# Patient Record
Sex: Male | Born: 1935 | Race: Black or African American | Hispanic: No | Marital: Married | Smoking: Former smoker
Health system: Southern US, Community
[De-identification: ages and names within clinical notes are randomized; demographics above are authoritative.]

## PROBLEM LIST (undated history)

## (undated) DIAGNOSIS — I10 Essential (primary) hypertension: Secondary | ICD-10-CM

## (undated) DIAGNOSIS — J189 Pneumonia, unspecified organism: Secondary | ICD-10-CM

## (undated) DIAGNOSIS — Z5111 Encounter for antineoplastic chemotherapy: Secondary | ICD-10-CM

## (undated) DIAGNOSIS — C3490 Malignant neoplasm of unspecified part of unspecified bronchus or lung: Principal | ICD-10-CM

## (undated) DIAGNOSIS — I509 Heart failure, unspecified: Secondary | ICD-10-CM

## (undated) DIAGNOSIS — I4891 Unspecified atrial fibrillation: Secondary | ICD-10-CM

## (undated) DIAGNOSIS — E7800 Pure hypercholesterolemia, unspecified: Secondary | ICD-10-CM

## (undated) DIAGNOSIS — K219 Gastro-esophageal reflux disease without esophagitis: Secondary | ICD-10-CM

## (undated) DIAGNOSIS — Z5112 Encounter for antineoplastic immunotherapy: Secondary | ICD-10-CM

## (undated) HISTORY — PX: WISDOM TOOTH EXTRACTION: SHX21

## (undated) HISTORY — PX: CATARACT EXTRACTION: SUR2

## (undated) HISTORY — DX: Encounter for antineoplastic chemotherapy: Z51.11

## (undated) HISTORY — DX: Malignant neoplasm of unspecified part of unspecified bronchus or lung: C34.90

## (undated) HISTORY — DX: Encounter for antineoplastic immunotherapy: Z51.12

## (undated) HISTORY — PX: ROTATOR CUFF REPAIR: SHX139

---

## 1999-05-17 ENCOUNTER — Inpatient Hospital Stay (HOSPITAL_COMMUNITY): Admission: EM | Admit: 1999-05-17 | Discharge: 1999-05-17 | Payer: Self-pay | Admitting: Emergency Medicine

## 1999-05-17 ENCOUNTER — Encounter: Payer: Self-pay | Admitting: Emergency Medicine

## 1999-06-24 ENCOUNTER — Ambulatory Visit (HOSPITAL_COMMUNITY): Admission: RE | Admit: 1999-06-24 | Discharge: 1999-06-24 | Payer: Self-pay | Admitting: Surgery

## 1999-07-18 ENCOUNTER — Ambulatory Visit (HOSPITAL_BASED_OUTPATIENT_CLINIC_OR_DEPARTMENT_OTHER): Admission: RE | Admit: 1999-07-18 | Discharge: 1999-07-18 | Payer: Self-pay | Admitting: Surgery

## 2000-08-16 ENCOUNTER — Ambulatory Visit (HOSPITAL_COMMUNITY): Admission: RE | Admit: 2000-08-16 | Discharge: 2000-08-16 | Payer: Self-pay | Admitting: *Deleted

## 2002-02-08 ENCOUNTER — Emergency Department (HOSPITAL_COMMUNITY): Admission: EM | Admit: 2002-02-08 | Discharge: 2002-02-08 | Payer: Self-pay | Admitting: Emergency Medicine

## 2007-02-20 ENCOUNTER — Encounter: Admission: RE | Admit: 2007-02-20 | Discharge: 2007-02-20 | Payer: Self-pay | Admitting: Internal Medicine

## 2007-06-14 ENCOUNTER — Encounter: Admission: RE | Admit: 2007-06-14 | Discharge: 2007-06-14 | Payer: Self-pay | Admitting: Family Medicine

## 2008-02-03 ENCOUNTER — Encounter: Admission: RE | Admit: 2008-02-03 | Discharge: 2008-02-03 | Payer: Self-pay | Admitting: Internal Medicine

## 2010-08-21 ENCOUNTER — Inpatient Hospital Stay (HOSPITAL_COMMUNITY)
Admission: EM | Admit: 2010-08-21 | Discharge: 2010-08-26 | Payer: Self-pay | Source: Home / Self Care | Admitting: Emergency Medicine

## 2010-12-13 LAB — BASIC METABOLIC PANEL
BUN: 10 mg/dL (ref 6–23)
BUN: 11 mg/dL (ref 6–23)
BUN: 13 mg/dL (ref 6–23)
BUN: 9 mg/dL (ref 6–23)
CO2: 25 mEq/L (ref 19–32)
Calcium: 8.4 mg/dL (ref 8.4–10.5)
Calcium: 8.7 mg/dL (ref 8.4–10.5)
Chloride: 104 mEq/L (ref 96–112)
Chloride: 106 mEq/L (ref 96–112)
Creatinine, Ser: 1.15 mg/dL (ref 0.4–1.5)
Creatinine, Ser: 1.22 mg/dL (ref 0.4–1.5)
GFR calc non Af Amer: 59 mL/min — ABNORMAL LOW (ref >60)
GFR calc non Af Amer: >60 mL/min (ref >60)
GFR calc non Af Amer: >60 mL/min (ref >60)
Glucose, Bld: 111 mg/dL — ABNORMAL HIGH (ref 70–99)
Glucose, Bld: 117 mg/dL — ABNORMAL HIGH (ref 70–99)
Glucose, Bld: 121 mg/dL — ABNORMAL HIGH (ref 70–99)
Potassium: 3.8 mEq/L (ref 3.5–5.1)
Potassium: 3.8 mEq/L (ref 3.5–5.1)

## 2010-12-13 LAB — CULTURE, BLOOD (ROUTINE X 2)
Culture  Setup Time: 201111201743
Culture: NO GROWTH
Culture: NO GROWTH

## 2010-12-13 LAB — CBC
HCT: 33.7 % — ABNORMAL LOW (ref 39.0–52.0)
HCT: 33.9 % — ABNORMAL LOW (ref 39.0–52.0)
HCT: 34.9 % — ABNORMAL LOW (ref 39.0–52.0)
HCT: 41.9 % (ref 39.0–52.0)
MCHC: 34.4 g/dL (ref 30.0–36.0)
MCHC: 34.6 g/dL (ref 30.0–36.0)
MCHC: 34.8 g/dL (ref 30.0–36.0)
MCHC: 35.1 g/dL (ref 30.0–36.0)
MCV: 86.3 fL (ref 78.0–100.0)
MCV: 87.3 fL (ref 78.0–100.0)
MCV: 87.5 fL (ref 78.0–100.0)
Platelets: 119 10*3/uL — ABNORMAL LOW (ref 150–400)
Platelets: 175 10*3/uL (ref 150–400)
RDW: 13.1 % (ref 11.5–15.5)
RDW: 13.2 % (ref 11.5–15.5)
RDW: 13.2 % (ref 11.5–15.5)
RDW: 13.3 % (ref 11.5–15.5)
RDW: 13.3 % (ref 11.5–15.5)
WBC: 17 10*3/uL — ABNORMAL HIGH (ref 4.0–10.5)
WBC: 18.8 10*3/uL — ABNORMAL HIGH (ref 4.0–10.5)

## 2010-12-13 LAB — COMPREHENSIVE METABOLIC PANEL
ALT: 24 U/L (ref 0–53)
AST: 24 U/L (ref 0–37)
Albumin: 2.7 g/dL — ABNORMAL LOW (ref 3.5–5.2)
CO2: 26 mEq/L (ref 19–32)
Calcium: 8.6 mg/dL (ref 8.4–10.5)
GFR calc Af Amer: >60 mL/min (ref >60)
GFR calc non Af Amer: >60 mL/min (ref >60)
Sodium: 137 mEq/L (ref 135–145)
Total Protein: 5.9 g/dL — ABNORMAL LOW (ref 6.0–8.3)

## 2010-12-13 LAB — DIFFERENTIAL
Basophils Absolute: 0 10*3/uL (ref 0.0–0.1)
Basophils Relative: 0 % (ref 0–1)
Eosinophils Relative: 1 % (ref 0–5)
Monocytes Absolute: 1.3 10*3/uL — ABNORMAL HIGH (ref 0.1–1.0)

## 2010-12-13 LAB — URINALYSIS, MICROSCOPIC ONLY
Glucose, UA: NEGATIVE mg/dL
Ketones, ur: NEGATIVE mg/dL
pH: 5.5 (ref 5.0–8.0)

## 2010-12-13 LAB — POCT I-STAT, CHEM 8
Calcium, Ion: 1.1 mmol/L — ABNORMAL LOW (ref 1.12–1.32)
Chloride: 105 mEq/L (ref 96–112)
Glucose, Bld: 142 mg/dL — ABNORMAL HIGH (ref 70–99)
HCT: 46 % (ref 39.0–52.0)
TCO2: 26 mmol/L (ref 0–100)

## 2010-12-13 LAB — HEMOGLOBIN A1C: Hgb A1c MFr Bld: 5.8 % — ABNORMAL HIGH (ref <5.7)

## 2010-12-13 LAB — URINALYSIS, ROUTINE W REFLEX MICROSCOPIC
Ketones, ur: 40 mg/dL — AB
Nitrite: POSITIVE — AB
Protein, ur: >300 mg/dL — AB
Urobilinogen, UA: 1 mg/dL (ref 0.0–1.0)

## 2010-12-13 LAB — URINE MICROSCOPIC-ADD ON

## 2010-12-13 LAB — URINE CULTURE
Colony Count: >=100000
Culture  Setup Time: 201111201741
Culture  Setup Time: 201111241854
Special Requests: POSITIVE

## 2010-12-13 LAB — LIPID PANEL
Cholesterol: 127 mg/dL (ref 0–200)
LDL Cholesterol: 73 mg/dL (ref 0–99)
VLDL: 8 mg/dL (ref 0–40)

## 2015-01-07 ENCOUNTER — Encounter (INDEPENDENT_AMBULATORY_CARE_PROVIDER_SITE_OTHER): Payer: Medicare Other | Admitting: Ophthalmology

## 2015-01-07 DIAGNOSIS — H43813 Vitreous degeneration, bilateral: Secondary | ICD-10-CM | POA: Diagnosis not present

## 2015-01-07 DIAGNOSIS — H35033 Hypertensive retinopathy, bilateral: Secondary | ICD-10-CM | POA: Diagnosis not present

## 2015-01-07 DIAGNOSIS — I10 Essential (primary) hypertension: Secondary | ICD-10-CM

## 2015-01-07 DIAGNOSIS — H33302 Unspecified retinal break, left eye: Secondary | ICD-10-CM

## 2015-01-19 ENCOUNTER — Ambulatory Visit (INDEPENDENT_AMBULATORY_CARE_PROVIDER_SITE_OTHER): Payer: Medicare Other | Admitting: Ophthalmology

## 2015-01-19 DIAGNOSIS — H33302 Unspecified retinal break, left eye: Secondary | ICD-10-CM

## 2015-05-21 ENCOUNTER — Ambulatory Visit (INDEPENDENT_AMBULATORY_CARE_PROVIDER_SITE_OTHER): Payer: Medicare Other | Admitting: Ophthalmology

## 2015-05-21 DIAGNOSIS — H33302 Unspecified retinal break, left eye: Secondary | ICD-10-CM | POA: Diagnosis not present

## 2015-05-21 DIAGNOSIS — H35033 Hypertensive retinopathy, bilateral: Secondary | ICD-10-CM | POA: Diagnosis not present

## 2015-05-21 DIAGNOSIS — H43813 Vitreous degeneration, bilateral: Secondary | ICD-10-CM | POA: Diagnosis not present

## 2015-05-21 DIAGNOSIS — I10 Essential (primary) hypertension: Secondary | ICD-10-CM

## 2015-09-09 ENCOUNTER — Other Ambulatory Visit: Payer: Self-pay | Admitting: Cardiology

## 2015-09-09 ENCOUNTER — Ambulatory Visit
Admission: RE | Admit: 2015-09-09 | Discharge: 2015-09-09 | Disposition: A | Discharge status: Home / Self Care | Payer: Medicare Other | Source: Ambulatory Visit | Attending: Cardiology | Admitting: Cardiology

## 2015-09-09 DIAGNOSIS — R0602 Shortness of breath: Secondary | ICD-10-CM

## 2015-09-09 DIAGNOSIS — F17200 Nicotine dependence, unspecified, uncomplicated: Secondary | ICD-10-CM

## 2015-09-29 ENCOUNTER — Other Ambulatory Visit: Payer: Self-pay | Admitting: Internal Medicine

## 2015-09-29 ENCOUNTER — Ambulatory Visit
Admission: RE | Admit: 2015-09-29 | Discharge: 2015-09-29 | Disposition: A | Discharge status: Home / Self Care | Payer: Medicare Other | Source: Ambulatory Visit | Attending: Internal Medicine | Admitting: Internal Medicine

## 2015-09-29 DIAGNOSIS — J181 Lobar pneumonia, unspecified organism: Principal | ICD-10-CM

## 2015-09-29 DIAGNOSIS — J189 Pneumonia, unspecified organism: Secondary | ICD-10-CM

## 2015-09-30 ENCOUNTER — Other Ambulatory Visit: Payer: Self-pay | Admitting: Internal Medicine

## 2015-09-30 DIAGNOSIS — J189 Pneumonia, unspecified organism: Secondary | ICD-10-CM

## 2015-09-30 DIAGNOSIS — J181 Lobar pneumonia, unspecified organism: Principal | ICD-10-CM

## 2015-10-06 ENCOUNTER — Ambulatory Visit
Admission: RE | Admit: 2015-10-06 | Discharge: 2015-10-06 | Disposition: A | Discharge status: Home / Self Care | Payer: Medicare Other | Source: Ambulatory Visit | Attending: Internal Medicine | Admitting: Internal Medicine

## 2015-10-06 DIAGNOSIS — J189 Pneumonia, unspecified organism: Secondary | ICD-10-CM

## 2015-10-06 DIAGNOSIS — J181 Lobar pneumonia, unspecified organism: Principal | ICD-10-CM

## 2015-10-06 MED ORDER — IOPAMIDOL (ISOVUE-300) INJECTION 61%
75.0000 mL | Freq: Once | INTRAVENOUS | Status: AC | PRN
  Administered 2015-10-06: 75 mL via INTRAVENOUS

## 2015-11-12 ENCOUNTER — Other Ambulatory Visit: Payer: Self-pay | Admitting: Internal Medicine

## 2015-11-12 ENCOUNTER — Ambulatory Visit
Admission: RE | Admit: 2015-11-12 | Discharge: 2015-11-12 | Disposition: A | Discharge status: Home / Self Care | Payer: Medicare Other | Source: Ambulatory Visit | Attending: Internal Medicine | Admitting: Internal Medicine

## 2015-11-12 DIAGNOSIS — R05 Cough: Secondary | ICD-10-CM

## 2015-11-12 DIAGNOSIS — R059 Cough, unspecified: Secondary | ICD-10-CM

## 2015-11-12 DIAGNOSIS — J189 Pneumonia, unspecified organism: Secondary | ICD-10-CM

## 2015-11-17 ENCOUNTER — Emergency Department (HOSPITAL_COMMUNITY): Payer: Medicare Other

## 2015-11-17 ENCOUNTER — Encounter (HOSPITAL_COMMUNITY): Payer: Self-pay | Admitting: Vascular Surgery

## 2015-11-17 ENCOUNTER — Inpatient Hospital Stay (HOSPITAL_COMMUNITY)
Admission: EM | Admit: 2015-11-17 | Discharge: 2015-11-24 | DRG: 166 | Disposition: A | Discharge status: Home / Self Care | Payer: Medicare Other | Attending: Internal Medicine | Admitting: Internal Medicine

## 2015-11-17 DIAGNOSIS — E785 Hyperlipidemia, unspecified: Secondary | ICD-10-CM | POA: Diagnosis not present

## 2015-11-17 DIAGNOSIS — Z87891 Personal history of nicotine dependence: Secondary | ICD-10-CM | POA: Diagnosis not present

## 2015-11-17 DIAGNOSIS — R918 Other nonspecific abnormal finding of lung field: Secondary | ICD-10-CM | POA: Diagnosis not present

## 2015-11-17 DIAGNOSIS — R05 Cough: Secondary | ICD-10-CM | POA: Diagnosis present

## 2015-11-17 DIAGNOSIS — I10 Essential (primary) hypertension: Secondary | ICD-10-CM | POA: Diagnosis present

## 2015-11-17 DIAGNOSIS — N400 Enlarged prostate without lower urinary tract symptoms: Secondary | ICD-10-CM | POA: Diagnosis present

## 2015-11-17 DIAGNOSIS — N170 Acute kidney failure with tubular necrosis: Secondary | ICD-10-CM | POA: Diagnosis not present

## 2015-11-17 DIAGNOSIS — I2699 Other pulmonary embolism without acute cor pulmonale: Secondary | ICD-10-CM | POA: Diagnosis not present

## 2015-11-17 DIAGNOSIS — Z7982 Long term (current) use of aspirin: Secondary | ICD-10-CM | POA: Diagnosis not present

## 2015-11-17 DIAGNOSIS — C7951 Secondary malignant neoplasm of bone: Secondary | ICD-10-CM | POA: Diagnosis present

## 2015-11-17 DIAGNOSIS — C7800 Secondary malignant neoplasm of unspecified lung: Secondary | ICD-10-CM

## 2015-11-17 DIAGNOSIS — R0902 Hypoxemia: Secondary | ICD-10-CM | POA: Insufficient documentation

## 2015-11-17 DIAGNOSIS — J9601 Acute respiratory failure with hypoxia: Secondary | ICD-10-CM | POA: Diagnosis present

## 2015-11-17 DIAGNOSIS — N179 Acute kidney failure, unspecified: Secondary | ICD-10-CM | POA: Diagnosis present

## 2015-11-17 DIAGNOSIS — Z9889 Other specified postprocedural states: Secondary | ICD-10-CM

## 2015-11-17 DIAGNOSIS — J189 Pneumonia, unspecified organism: Secondary | ICD-10-CM | POA: Diagnosis present

## 2015-11-17 DIAGNOSIS — C3432 Malignant neoplasm of lower lobe, left bronchus or lung: Principal | ICD-10-CM | POA: Diagnosis present

## 2015-11-17 DIAGNOSIS — J1100 Influenza due to unidentified influenza virus with unspecified type of pneumonia: Secondary | ICD-10-CM | POA: Diagnosis present

## 2015-11-17 DIAGNOSIS — Z79899 Other long term (current) drug therapy: Secondary | ICD-10-CM

## 2015-11-17 DIAGNOSIS — I4891 Unspecified atrial fibrillation: Secondary | ICD-10-CM | POA: Diagnosis not present

## 2015-11-17 HISTORY — DX: Unspecified atrial fibrillation: I48.91

## 2015-11-17 HISTORY — DX: Pneumonia, unspecified organism: J18.9

## 2015-11-17 HISTORY — DX: Gastro-esophageal reflux disease without esophagitis: K21.9

## 2015-11-17 HISTORY — DX: Pure hypercholesterolemia, unspecified: E78.00

## 2015-11-17 HISTORY — DX: Essential (primary) hypertension: I10

## 2015-11-17 LAB — COMPREHENSIVE METABOLIC PANEL
ALK PHOS: 77 U/L (ref 38–126)
ALT: 19 U/L (ref 17–63)
AST: 32 U/L (ref 15–41)
Albumin: 3.8 g/dL (ref 3.5–5.0)
Anion gap: 11 (ref 5–15)
BUN: 18 mg/dL (ref 6–20)
CALCIUM: 9 mg/dL (ref 8.9–10.3)
CHLORIDE: 108 mmol/L (ref 101–111)
CO2: 21 mmol/L — ABNORMAL LOW (ref 22–32)
CREATININE: 1.37 mg/dL — AB (ref 0.61–1.24)
GFR calc non Af Amer: 48 mL/min — ABNORMAL LOW (ref >60)
GFR, EST AFRICAN AMERICAN: 56 mL/min — AB (ref >60)
Glucose, Bld: 161 mg/dL — ABNORMAL HIGH (ref 65–99)
Potassium: 4 mmol/L (ref 3.5–5.1)
SODIUM: 140 mmol/L (ref 135–145)
Total Bilirubin: 0.5 mg/dL (ref 0.3–1.2)
Total Protein: 7.2 g/dL (ref 6.5–8.1)

## 2015-11-17 LAB — CBC WITH DIFFERENTIAL/PLATELET
Basophils Absolute: 0 10*3/uL (ref 0.0–0.1)
Basophils Relative: 0 %
EOS ABS: 0.3 10*3/uL (ref 0.0–0.7)
EOS PCT: 9 %
HCT: 39.8 % (ref 39.0–52.0)
HEMOGLOBIN: 13.4 g/dL (ref 13.0–17.0)
LYMPHS ABS: 0.6 10*3/uL — AB (ref 0.7–4.0)
LYMPHS PCT: 17 %
MCH: 30 pg (ref 26.0–34.0)
MCHC: 33.7 g/dL (ref 30.0–36.0)
MCV: 89.2 fL (ref 78.0–100.0)
MONOS PCT: 14 %
Monocytes Absolute: 0.5 10*3/uL (ref 0.1–1.0)
Neutro Abs: 2.1 10*3/uL (ref 1.7–7.7)
Neutrophils Relative %: 60 %
PLATELETS: 116 10*3/uL — AB (ref 150–400)
RBC: 4.46 MIL/uL (ref 4.22–5.81)
RDW: 13 % (ref 11.5–15.5)
WBC: 3.4 10*3/uL — ABNORMAL LOW (ref 4.0–10.5)

## 2015-11-17 LAB — URINALYSIS, ROUTINE W REFLEX MICROSCOPIC
BILIRUBIN URINE: NEGATIVE
Glucose, UA: NEGATIVE mg/dL
HGB URINE DIPSTICK: NEGATIVE
KETONES UR: NEGATIVE mg/dL
Leukocytes, UA: NEGATIVE
NITRITE: NEGATIVE
PH: 5 (ref 5.0–8.0)
Protein, ur: NEGATIVE mg/dL
SPECIFIC GRAVITY, URINE: 1.024 (ref 1.005–1.030)

## 2015-11-17 LAB — I-STAT CG4 LACTIC ACID, ED
Lactic Acid, Venous: 2.63 mmol/L (ref 0.5–2.0)
Lactic Acid, Venous: 0.77 mmol/L (ref 0.5–2.0)

## 2015-11-17 MED ORDER — DEXTROSE 5 % IV SOLN
1.0000 g | Freq: Once | INTRAVENOUS | Status: AC
  Administered 2015-11-17: 1 g via INTRAVENOUS
  Filled 2015-11-17: qty 10

## 2015-11-17 MED ORDER — SODIUM CHLORIDE 0.9 % IV BOLUS (SEPSIS)
1000.0000 mL | INTRAVENOUS | Status: AC
  Administered 2015-11-17 (×3): 1000 mL via INTRAVENOUS

## 2015-11-17 MED ORDER — DEXTROSE 5 % IV SOLN
500.0000 mg | Freq: Once | INTRAVENOUS | Status: AC
  Administered 2015-11-17: 500 mg via INTRAVENOUS
  Filled 2015-11-17: qty 500

## 2015-11-17 MED ORDER — SODIUM CHLORIDE 0.9 % IV BOLUS (SEPSIS)
500.0000 mL | INTRAVENOUS | Status: AC
  Administered 2015-11-17: 500 mL via INTRAVENOUS

## 2015-11-17 MED ORDER — DEXTROSE 5 % IV SOLN: 500.0000 mg | INTRAVENOUS | Status: DC

## 2015-11-17 MED ORDER — DEXTROSE 5 % IV SOLN: 1.0000 g | INTRAVENOUS | Status: DC

## 2015-11-17 NOTE — ED Provider Notes (Signed)
CSN: [id number]     Arrival date & time 11/17/15  1117 History   First MD Initiated Contact with Patient 11/17/15 1155     Chief Complaint  Patient presents with  . Pneumonia     (Consider location/radiation/quality/duration/timing/severity/associated sxs/prior Treatment) HPI Comments: 80 year old male with history of hypertension and hyperlipidemia who presents with cough. Patient was treated twice with oral antibiotics over the past 2 months for pneumonia. He saw his PCP 5 days ago and chest x-ray showed persistent infiltrate so he has been referred to a pulmonologist for evaluation. He reports that 4 days ago he began having a cough again associated with nasal congestion, generalized fatigue, and decreased oral intake. He reports gradually worsening shortness of breath. He denies any pain. No fevers, vomiting, or diarrhea. No recent hospitalizations.  Patient is a 80 y.o. male presenting with pneumonia. The history is provided by the patient and the spouse.  Pneumonia    Past Medical History  Diagnosis Date  . Hypertension   . Hypercholesteremia    Past Surgical History  Procedure Laterality Date  . Wisdom tooth extraction    . Rotator cuff repair Left    No family history on file. Social History  Substance Use Topics  . Smoking status: Former Smoker    Types: Cigarettes  . Smokeless tobacco: Never Used  . Alcohol Use: No    Review of Systems 10 Systems reviewed and are negative for acute change except as noted in the HPI.    Allergies  Review of patient's allergies indicates not on file.  Home Medications   Prior to Admission medications   Not on File   BP 76/67 mmHg  Pulse 81  Temp(Src) 97.4 F (36.3 C) (Oral)  Resp 23  Ht '6\' 1"'$  (1.854 m)  Wt 241 lb 1.6 oz (109.362 kg)  BMI 31.82 kg/m2  SpO2 94% Physical Exam  Constitutional: He is oriented to person, place, and time. He appears well-developed and well-nourished. No distress.  HENT:  Head:  Normocephalic and atraumatic.  Moist mucous membranes  Eyes: Conjunctivae are normal. Pupils are equal, round, and reactive to light.  Neck: Neck supple.  Cardiovascular: Normal rate, regular rhythm and normal heart sounds.   No murmur heard. Pulmonary/Chest: Effort normal.  Diminished BS b/l with scattered occasional expiratory wheezes  Abdominal: Soft. Bowel sounds are normal. He exhibits no distension. There is no tenderness.  Musculoskeletal: He exhibits no edema.  Neurological: He is alert and oriented to person, place, and time.  Fluent speech  Skin: Skin is warm. He is diaphoretic.  Psychiatric: He has a normal mood and affect. Judgment normal.  Nursing note and vitals reviewed.   ED Course  .Critical Care Performed by: Sharlett Iles Authorized by: Sharlett Iles Total critical care time: 35 minutes Critical care time was exclusive of separately billable procedures and treating other patients. Critical care was necessary to treat or prevent imminent or life-threatening deterioration of the following conditions: sepsis. Critical care was time spent personally by me on the following activities: development of treatment plan with patient or surrogate, evaluation of patient's response to treatment, examination of patient, obtaining history from patient or surrogate, ordering and performing treatments and interventions, ordering and review of laboratory studies, ordering and review of radiographic studies, pulse oximetry, re-evaluation of patient's condition and review of old charts.   (including critical care time) Labs Review Labs Reviewed  COMPREHENSIVE METABOLIC PANEL - Abnormal; Notable for the following:    CO2 21 (*)  Glucose, Bld 161 (*)    Creatinine, Ser 1.37 (*)    GFR calc non Af Amer 48 (*)    GFR calc Af Amer 56 (*)    All other components within normal limits  I-STAT CG4 LACTIC ACID, ED - Abnormal; Notable for the following:    Lactic Acid,  Venous 2.63 (*)    All other components within normal limits  URINE CULTURE  CULTURE, BLOOD (ROUTINE X 2)  CULTURE, BLOOD (ROUTINE X 2)  URINALYSIS, ROUTINE W REFLEX MICROSCOPIC (NOT AT [facility])  CBC WITH DIFFERENTIAL/PLATELET    Imaging Review Dg Chest 2 View  11/17/2015  CLINICAL DATA:  Cough, 4 days duration. Recent history of pneumonia. EXAM: CHEST  2 VIEW COMPARISON:  11/12/2015.  09/29/2015. FINDINGS: Infiltrate in the left perihilar region and left lower lobe have worsened. Multiple nodular shadows demonstrated at CT are subtly visible on the chest radiograph. No effusions. No bony abnormalities. Atherosclerosis affects the aorta. IMPRESSION: Worsened infiltrate in the left perihilar region and left lower lobe. Persistent faint nodular opacities in both lungs. Though there is almost certainly a component of pneumonia, I am concerned about malignancy and metastatic disease in this case. I would suggest that bronchoscopy at least be considered in this case. Electronically Signed   By: Nelson Chimes M.D.   On: 11/17/2015 11:55   I have personally reviewed and evaluated these lab results as part of my medical decision-making.   EKG Interpretation None     Medications  sodium chloride 0.9 % bolus 1,000 mL (1,000 mLs Intravenous New Bag/Given 11/17/15 1222)    Followed by  sodium chloride 0.9 % bolus 500 mL (not administered)  cefTRIAXone (ROCEPHIN) 1 g in dextrose 5 % 50 mL IVPB (1 g Intravenous New Bag/Given 11/17/15 1251)  azithromycin (ZITHROMAX) 500 mg in dextrose 5 % 250 mL IVPB (500 mg Intravenous New Bag/Given 11/17/15 1252)  cefTRIAXone (ROCEPHIN) 1 g in dextrose 5 % 50 mL IVPB (not administered)  azithromycin (ZITHROMAX) 500 mg in dextrose 5 % 250 mL IVPB (not administered)    MDM   Final diagnoses:  Community acquired pneumonia  Hypoxia   sepsis secondary to community-acquired pneumonia  Pt w/ persistent lung infiltrate despite 2 courses of antibiotics to treat  community-acquired pneumonia. On arrival, the patient was diaphoretic but nontoxic in appearance. He was mentating appropriately with normal work of breathing. O2 sat 89-90% on room air and he was placed on nasal cannula. Diminished breath sounds bilaterally. Blood pressure low at 94/61. Given his known infection, initiated a code sepsis with IV fluids, blood and urine cultures, and ceftriaxone and azithromycin to cover for community-acquired pneumonia. Labs show mild AK I with creatinine of 1.37, lactate 2.63 CXR shows worsening infiltrates in L lung w/ concern for underlying malignancy. Discussed admission with [facility] hospitalist, Dr. Sloan Leiter, and pt admitted for further treatment.   Sharlett Iles, MD 11/17/15 [phone]

## 2015-11-17 NOTE — ED Notes (Addendum)
Pt reports to the ED for eval of fatigue, decreased appetite, nasal congestion/drainage, and intermittently productive cough. He reports he was dx with PNA over a month ago and was tx with abx but he followed up with his PCP and he was found to still have a little bit of PNA and he was referred to a pulmonologist whom he has not been able to f/u with yet. Denies any fevers, chills, or N/V/D. Pt A&OX4 and resp e/u. Pt diaphoretic.

## 2015-11-17 NOTE — H&P (Signed)
PATIENT DETAILS Name: Matthew Berger Age: 80 y.o. Sex: male Date of Birth: [birth year] Admit Date: 11/17/2015 YKD:[id number] C, MD Referring Physician:Dr Little   CHIEF COMPLAINT:  Cough  HPI: Matthew Berger is a 80 y.o. male with a Past Medical History of hypertension, BPH who presents today with the above noted complaint. Per patient, since mid December he's been having a mostly dry cough. He was diagnosed with a pneumonia on chest x-ray and was prescribed antibiotic in mid December. Upon follow-up with his primary care practitioner, he was told he still had pneumonia, antibiotic treatment was extended by another week. He unfortunately continues to have symptoms, he has had numerous x-rays that show persistent left-sided infiltrate. He presents today with feeling weak and ongoing cough. He had he denies any fever. Does complain of some mild nasal congestion and denies myalgias. No nausea, vomiting or diarrhea. No skin rash.  ALLERGIES:  No Known Allergies  PAST MEDICAL HISTORY: Past Medical History  Diagnosis Date  . Hypertension   . Hypercholesteremia     PAST SURGICAL HISTORY: Past Surgical History  Procedure Laterality Date  . Wisdom tooth extraction    . Rotator cuff repair Left     MEDICATIONS AT HOME: Prior to Admission medications   Medication Sig Start Date End Date Taking? Authorizing Provider  aspirin 81 MG tablet Take 81 mg by mouth daily.   Yes Historical Provider, MD  atorvastatin (LIPITOR) 40 MG tablet Take 40 mg by mouth daily.   Yes Historical Provider, MD  diltiazem (DILACOR XR) 180 MG 24 hr capsule Take 180 mg by mouth daily.   Yes Historical Provider, MD  metoprolol tartrate (LOPRESSOR) 25 MG tablet Take 25 mg by mouth 2 (two) times daily.   Yes Historical Provider, MD  Multiple Vitamins-Minerals (MULTIVITAMIN WITH MINERALS) tablet Take 1 tablet by mouth daily.   Yes Historical Provider, MD  naproxen sodium (ALEVE) 220 MG tablet  Take 220 mg by mouth 2 (two) times daily with a meal.   Yes Historical Provider, MD  tamsulosin (FLOMAX) 0.4 MG CAPS capsule Take 0.4 mg by mouth daily.   Yes Historical Provider, MD    FAMILY HISTORY: Denies family history of CAD.  SOCIAL HISTORY:  reports that he has quit smoking. His smoking use included Cigarettes. He has never used smokeless tobacco. He reports that he does not drink alcohol or use illicit drugs. Lives at: Home Mobility: Independent  REVIEW OF SYSTEMS:  Constitutional:   No  weight loss, night sweats,  Fevers, chills, fatigue.  HEENT:    No headaches, Dysphagia,Tooth/dental problems,Sore throat,  No sneezing, itching, ear ache, nasal congestion, post nasal drip  Cardio-vascular: No chest pain,Orthopnea, PND,lower extremity edema, anasarca, palpitations  GI:  No heartburn, indigestion, abdominal pain, nausea, vomiting, diarrhea, melena or hematochezia  Resp: No hemoptysis,plueritic chest pain.   Skin:  No rash or lesions.  GU:  No dysuria, change in color of urine, no urgency or frequency.  No flank pain.  Musculoskeletal: No joint pain or swelling.  No decreased range of motion.  No back pain.  Endocrine: No heat intolerance, no cold intolerance, no polyuria, no polydipsia  Psych: No change in mood or affect. No depression or anxiety.  No memory loss.   PHYSICAL EXAM: Blood pressure 129/78, pulse 71, temperature 98.5 F (36.9 C), temperature source Oral, resp. rate 23, height '6\' 1"'$  (1.854 m), weight 109.362 kg (241 lb 1.6 oz), SpO2 98 %.  General  appearance :Awake, alert, not in any distress. Speech Clear. Not toxic Looking HEENT: Atraumatic and Normocephalic, pupils equally reactive to light and accomodation Neck: supple, no JVD. No cervical lymphadenopathy.  Chest:Good air entry bilaterally, no added sounds  CVS: S1 S2 regular, no murmurs.  Abdomen: Bowel sounds present, Non tender and not distended with no gaurding, rigidity or  rebound. Extremities: B/L Lower Ext shows no edema, both legs are warm to touch Neurology:  Non focal Skin:No Rash Wounds:N/A  LABS ON ADMISSION:   Recent Labs  11/17/15 1143  NA 140  K 4.0  CL 108  CO2 21*  GLUCOSE 161*  BUN 18  CREATININE 1.37*  CALCIUM 9.0    Recent Labs  11/17/15 1143  AST 32  ALT 19  ALKPHOS 77  BILITOT 0.5  PROT 7.2  ALBUMIN 3.8   No results for input(s): LIPASE, AMYLASE in the last 72 hours.  Recent Labs  11/17/15 1411  WBC 3.4*  NEUTROABS 2.1  HGB 13.4  HCT 39.8  MCV 89.2  PLT 116*   No results for input(s): CKTOTAL, CKMB, CKMBINDEX, TROPONINI in the last 72 hours. No results for input(s): DDIMER in the last 72 hours. Invalid input(s): POCBNP   RADIOLOGIC STUDIES ON ADMISSION: Dg Chest 2 View  11/17/2015  CLINICAL DATA:  Cough, 4 days duration. Recent history of pneumonia. EXAM: CHEST  2 VIEW COMPARISON:  11/12/2015.  09/29/2015. FINDINGS: Infiltrate in the left perihilar region and left lower lobe have worsened. Multiple nodular shadows demonstrated at CT are subtly visible on the chest radiograph. No effusions. No bony abnormalities. Atherosclerosis affects the aorta. IMPRESSION: Worsened infiltrate in the left perihilar region and left lower lobe. Persistent faint nodular opacities in both lungs. Though there is almost certainly a component of pneumonia, I am concerned about malignancy and metastatic disease in this case. I would suggest that bronchoscopy at least be considered in this case. Electronically Signed   By: Nelson Chimes M.D.   On: 11/17/2015 11:55    I have personally reviewed images of chest xray o   EKG: Personally reviewed. Normal sinus rhythm  ASSESSMENT AND PLAN: Present on Admission:  . Persistent left-sided infiltrate: Ex-smoker-empirically start Rocephin and Zithromax for possible community-acquired Pneumonia. Creatinine is mildly elevated, will hydrate overnight-and repeat creatinine in the morning-if  stable-we will do a CT scan of the chest with IV contrast-to rule out underlying malignancy. Suspect may need a bronchoscopy/pulmonary consultation  . Mild ARF (acute renal failure): Likely mild prerenal azotemia secondary to above. Hydrate and recheck. Blood pressure was soft on admission, hold all antihypertensives.  Marland Kitchen HTN (hypertension): Hold antihypertensives-blood pressure was soft on initial presentation-currently in the low 100s. Follow and resume accordingly   . Dyslipidemia: Continue statin   . BPH (benign prostatic hyperplasia): Continue Flomax  Further plan will depend as patient's clinical course evolves and further radiologic and laboratory data become available. Patient will be monitored closely.  Above noted plan was discussed with patient face to face at bedside, he was in agreement.   CONSULTS: None  DVT Prophylaxis: Prophylactic Lovenox   Code Status: Full Code  Disposition Plan:  Discharge back home in 1-2 days  Total time spent 45 minutes.Greater than 50% of this time was spent in counseling, explanation of diagnosis, planning of further management, and coordination of care.  [facility] Pager [phone]  If 7PM-7AM, please contact night-coverage [email or web address] Password [email or web address] 11/17/2015, 4:26 PM

## 2015-11-17 NOTE — Progress Notes (Addendum)
Pharmacy Antibiotic Note  Matthew Berger is a 80 y.o. male admitted on 11/17/2015 with pneumonia.  Pharmacy has been consulted for ceftriaxone and azithromycin dosing.  Plan: Ceftriaxone 1 g q24 h x 7 days and Azithromycin 500 mg q24h x 5 days  Height: '6\' 1"'$  (185.4 cm) Weight: 241 lb 1.6 oz (109.362 kg) IBW/kg (Calculated) : 79.9  Temp (24hrs), Avg:98.1 F (36.7 C), Min:98.1 F (36.7 C), Max:98.1 F (36.7 C)   Recent Labs Lab 11/17/15 1140 11/17/15 1143  CREATININE  --  1.37*  LATICACIDVEN 2.63*  --     Estimated Creatinine Clearance: 58.6 mL/min (by C-G formula based on Cr of 1.37).    Allergies not on file  Antimicrobials this admission: Ceftriaxone 2/15>> Azithromycin 2/15>>  Dose adjustments this admission: NA  Microbiology results: Blood x 2 Urine  Pharmacy Code Sepsis Protocol  Time of code sepsis page: 1210 '[x]'$  Antibiotics delivered at 1220  Were antibiotics ordered at the time of the code sepsis page? Yes Was it required to contact the physician? '[]'$  Physician not contacted '[]'$  Physician contacted to order antibiotics for code sepsis '[]'$  Physician contacted to recommend changing antibiotics  Pharmacy consulted for: ceftriaxone, azithromycin  Thank you for allowing pharmacy to be a part of this patient's care.  Levester Fresh, PharmD, BCPS, [facility] Clinical Pharmacist Pager [phone] 11/17/2015 12:23 PM

## 2015-11-18 ENCOUNTER — Encounter (HOSPITAL_COMMUNITY): Payer: Self-pay | Admitting: General Practice

## 2015-11-18 DIAGNOSIS — J189 Pneumonia, unspecified organism: Secondary | ICD-10-CM

## 2015-11-18 DIAGNOSIS — I10 Essential (primary) hypertension: Secondary | ICD-10-CM

## 2015-11-18 DIAGNOSIS — J1100 Influenza due to unidentified influenza virus with unspecified type of pneumonia: Secondary | ICD-10-CM | POA: Diagnosis present

## 2015-11-18 DIAGNOSIS — N179 Acute kidney failure, unspecified: Secondary | ICD-10-CM

## 2015-11-18 DIAGNOSIS — N400 Enlarged prostate without lower urinary tract symptoms: Secondary | ICD-10-CM

## 2015-11-18 DIAGNOSIS — E785 Hyperlipidemia, unspecified: Secondary | ICD-10-CM

## 2015-11-18 HISTORY — DX: Pneumonia, unspecified organism: J18.9

## 2015-11-18 LAB — CREATININE, SERUM
Creatinine, Ser: 1.03 mg/dL (ref 0.61–1.24)
GFR calc Af Amer: >60 mL/min (ref >60)
GFR calc non Af Amer: >60 mL/min (ref >60)

## 2015-11-18 LAB — CBC
HEMATOCRIT: 41.3 % (ref 39.0–52.0)
Hemoglobin: 13.9 g/dL (ref 13.0–17.0)
MCH: 30.4 pg (ref 26.0–34.0)
MCHC: 33.7 g/dL (ref 30.0–36.0)
MCV: 90.4 fL (ref 78.0–100.0)
PLATELETS: 110 10*3/uL — AB (ref 150–400)
RBC: 4.57 MIL/uL (ref 4.22–5.81)
RDW: 13.4 % (ref 11.5–15.5)
WBC: 6.6 10*3/uL (ref 4.0–10.5)

## 2015-11-18 LAB — INFLUENZA PANEL BY PCR (TYPE A & B)
H1N1FLUPCR: NOT DETECTED
Influenza A By PCR: NEGATIVE
Influenza B By PCR: NEGATIVE

## 2015-11-18 LAB — STREP PNEUMONIAE URINARY ANTIGEN: STREP PNEUMO URINARY ANTIGEN: NEGATIVE

## 2015-11-18 MED ORDER — ASPIRIN EC 81 MG PO TBEC
81.0000 mg | DELAYED_RELEASE_TABLET | Freq: Every day | ORAL | Status: DC
  Administered 2015-11-18 – 2015-11-23 (×5): 81 mg via ORAL
  Filled 2015-11-18 (×6): qty 1

## 2015-11-18 MED ORDER — CETYLPYRIDINIUM CHLORIDE 0.05 % MT LIQD
7.0000 mL | Freq: Two times a day (BID) | OROMUCOSAL | Status: DC
  Administered 2015-11-18 – 2015-11-24 (×10): 7 mL via OROMUCOSAL

## 2015-11-18 MED ORDER — ADULT MULTIVITAMIN W/MINERALS CH
1.0000 | ORAL_TABLET | Freq: Every day | ORAL | Status: DC
  Administered 2015-11-18 – 2015-11-24 (×6): 1 via ORAL
  Filled 2015-11-18 (×7): qty 1

## 2015-11-18 MED ORDER — DEXTROSE 5 % IV SOLN
1.0000 g | INTRAVENOUS | Status: DC
  Administered 2015-11-18 – 2015-11-23 (×7): 1 g via INTRAVENOUS
  Filled 2015-11-18 (×7): qty 10

## 2015-11-18 MED ORDER — ENOXAPARIN SODIUM 40 MG/0.4ML ~~LOC~~ SOLN
40.0000 mg | Freq: Every day | SUBCUTANEOUS | Status: DC
  Administered 2015-11-18 – 2015-11-23 (×5): 40 mg via SUBCUTANEOUS
  Filled 2015-11-18 (×6): qty 0.4

## 2015-11-18 MED ORDER — SODIUM CHLORIDE 0.9 % IV SOLN
INTRAVENOUS | Status: AC
  Administered 2015-11-18 (×2): via INTRAVENOUS

## 2015-11-18 MED ORDER — DEXTROSE 5 % IV SOLN
500.0000 mg | INTRAVENOUS | Status: DC
  Administered 2015-11-18 – 2015-11-20 (×4): 500 mg via INTRAVENOUS
  Filled 2015-11-18 (×4): qty 500

## 2015-11-18 MED ORDER — METOPROLOL TARTRATE 25 MG PO TABS
25.0000 mg | ORAL_TABLET | Freq: Two times a day (BID) | ORAL | Status: DC
  Administered 2015-11-18 – 2015-11-24 (×12): 25 mg via ORAL
  Filled 2015-11-18 (×12): qty 1

## 2015-11-18 MED ORDER — TAMSULOSIN HCL 0.4 MG PO CAPS
0.4000 mg | ORAL_CAPSULE | Freq: Every day | ORAL | Status: DC
  Administered 2015-11-18 – 2015-11-24 (×6): 0.4 mg via ORAL
  Filled 2015-11-18 (×7): qty 1

## 2015-11-18 MED ORDER — ATORVASTATIN CALCIUM 40 MG PO TABS
40.0000 mg | ORAL_TABLET | Freq: Every day | ORAL | Status: DC
  Administered 2015-11-18 – 2015-11-24 (×6): 40 mg via ORAL
  Filled 2015-11-18 (×7): qty 1

## 2015-11-18 NOTE — Progress Notes (Signed)
Patient arrived on unit via stretcher from ED.  No family at bedside.  Telemetry placed per MD order & CMT notified.

## 2015-11-18 NOTE — Progress Notes (Signed)
Patient had a 16 beat run of SVT.  Patient asymptomatic.  BP 138/78 HR 87.  MD notified. Will continue to monitor.

## 2015-11-18 NOTE — Progress Notes (Signed)
[facility] PROGRESS NOTE  CAMP GOPAL VZC:[id number] DOB: 1935/10/27 DOA: 11/17/2015 PCP: [name], MD  HPI/Brief narrative 80 y.o. male with a Past Medical History of hypertension, BPH who presents to the ED with over 2 mos hx of persistent pneumonia, failing outpatient treatment.   Assessment/Plan: . Persistent left-sided infiltrate: Ex-smoker-empirically start Rocephin and Zithromax for possible community-acquired Pneumonia. Creatinine was mildly elevated, improved with IVF hydration. Patient s/p chest ct in 1/17 with findings of persistent pneumonia and with recs for repeat study in 1-2 months. Will order repeat CT chest with contrast  . Mild ARF (acute renal failure): Likely mild prerenal azotemia secondary to above. Improved with hydration. Blood pressure was soft on admission, held all antihypertensives.  Marland Kitchen HTN (hypertension): Held antihypertensives on admit-blood pressure was soft on initial presentation  . Dyslipidemia: Continue statin   . BPH (benign prostatic hyperplasia): Continue Flomax  Code Status: Full Family Communication: Pt in room, wife at bedside Disposition Plan: Unclear at this time   Consultants:    Procedures:    Antibiotics: Anti-infectives    Start     Dose/Rate Route Frequency Ordered Stop   11/18/15 1200  cefTRIAXone (ROCEPHIN) 1 g in dextrose 5 % 50 mL IVPB  Status:  Discontinued     1 g 100 mL/hr over 30 Minutes Intravenous Every 24 hours 11/17/15 1224 11/18/15 0020   11/18/15 1200  azithromycin (ZITHROMAX) 500 mg in dextrose 5 % 250 mL IVPB  Status:  Discontinued     500 mg 250 mL/hr over 60 Minutes Intravenous Every 24 hours 11/17/15 1224 11/18/15 0020   11/18/15 0020  cefTRIAXone (ROCEPHIN) 1 g in dextrose 5 % 50 mL IVPB     1 g 100 mL/hr over 30 Minutes Intravenous Every 24 hours 11/18/15 0020     11/18/15 0020  azithromycin (ZITHROMAX) 500 mg in dextrose 5 % 250 mL IVPB     500 mg 250 mL/hr over 60 Minutes Intravenous  Every 24 hours 11/18/15 0020     11/17/15 1200  cefTRIAXone (ROCEPHIN) 1 g in dextrose 5 % 50 mL IVPB     1 g 100 mL/hr over 30 Minutes Intravenous  Once 11/17/15 1158 11/18/15 0826   11/17/15 1200  azithromycin (ZITHROMAX) 500 mg in dextrose 5 % 250 mL IVPB     500 mg 250 mL/hr over 60 Minutes Intravenous  Once 11/17/15 1158 11/18/15 0826      HPI/Subjective: Feels better today  Objective: Filed Vitals:   11/18/15 0800 11/18/15 0814 11/18/15 0815 11/18/15 0853  BP: 152/86 152/86 152/88 146/70  Pulse: 111 111 111 114  Temp:    99.4 F (37.4 C)  TempSrc:    Oral  Resp: '24 22 22 22  '$ Height:    '6\' 1"'$  (1.854 m)  Weight:    111.4 kg (245 lb 9.5 oz)  SpO2: 92% 93% 94% 97%    Intake/Output Summary (Last 24 hours) at 11/18/15 1624 Last data filed at 11/18/15 1531  Gross per 24 hour  Intake   3000 ml  Output   1200 ml  Net   1800 ml   Filed Weights   11/17/15 1124 11/18/15 0853  Weight: 109.362 kg (241 lb 1.6 oz) 111.4 kg (245 lb 9.5 oz)    Exam:   General:  Awake, in nad  Cardiovascular: regular, s1, s2  Respiratory: normal resp effort, no wheezing  Abdomen: soft, nondistended  Musculoskeletal: perfused, no clubbing   Data Reviewed: Basic Metabolic Panel:  Recent Labs Lab  11/17/15 1143 11/18/15 0036  NA 140  --   Berger 4.0  --   CL 108  --   CO2 21*  --   GLUCOSE 161*  --   BUN 18  --   CREATININE 1.37* 1.03  CALCIUM 9.0  --    Liver Function Tests:  Recent Labs Lab 11/17/15 1143  AST 32  ALT 19  ALKPHOS 77  BILITOT 0.5  PROT 7.2  ALBUMIN 3.8   No results for input(s): LIPASE, AMYLASE in the last 168 hours. No results for input(s): AMMONIA in the last 168 hours. CBC:  Recent Labs Lab 11/17/15 1411 11/18/15 0036  WBC 3.4* 6.6  NEUTROABS 2.1  --   HGB 13.4 13.9  HCT 39.8 41.3  MCV 89.2 90.4  PLT 116* 110*   Cardiac Enzymes: No results for input(s): CKTOTAL, CKMB, CKMBINDEX, TROPONINI in the last 168 hours. BNP (last 3 results) No  results for input(s): BNP in the last 8760 hours.  ProBNP (last 3 results) No results for input(s): PROBNP in the last 8760 hours.  CBG: No results for input(s): GLUCAP in the last 168 hours.  Recent Results (from the past 240 hour(s))  Blood Culture (routine x 2)     Status: None (Preliminary result)   Collection Time: 11/17/15 12:25 PM  Result Value Ref Range Status   Specimen Description BLOOD RIGHT ANTECUBITAL  Final   Special Requests BOTTLES DRAWN AEROBIC AND ANAEROBIC 5CC  Final   Culture NO GROWTH < 24 HOURS  Final   Report Status PENDING  Incomplete  Blood Culture (routine x 2)     Status: None (Preliminary result)   Collection Time: 11/17/15 12:35 PM  Result Value Ref Range Status   Specimen Description BLOOD RIGHT HAND  Final   Special Requests BOTTLES DRAWN AEROBIC AND ANAEROBIC 5CC  Final   Culture NO GROWTH < 24 HOURS  Final   Report Status PENDING  Incomplete  Urine culture     Status: None (Preliminary result)   Collection Time: 11/17/15  4:56 PM  Result Value Ref Range Status   Specimen Description URINE, CLEAN CATCH  Final   Special Requests NONE  Final   Culture NO GROWTH < 24 HOURS  Final   Report Status PENDING  Incomplete     Studies: Dg Chest 2 View  11/17/2015  CLINICAL DATA:  Cough, 4 days duration. Recent history of pneumonia. EXAM: CHEST  2 VIEW COMPARISON:  11/12/2015.  09/29/2015. FINDINGS: Infiltrate in the left perihilar region and left lower lobe have worsened. Multiple nodular shadows demonstrated at CT are subtly visible on the chest radiograph. No effusions. No bony abnormalities. Atherosclerosis affects the aorta. IMPRESSION: Worsened infiltrate in the left perihilar region and left lower lobe. Persistent faint nodular opacities in both lungs. Though there is almost certainly a component of pneumonia, I am concerned about malignancy and metastatic disease in this case. I would suggest that bronchoscopy at least be considered in this case.  Electronically Signed   By: Nelson Chimes M.D.   On: 11/17/2015 11:55    Scheduled Meds: . aspirin EC  81 mg Oral Daily  . atorvastatin  40 mg Oral Daily  . azithromycin  500 mg Intravenous Q24H  . cefTRIAXone (ROCEPHIN)  IV  1 g Intravenous Q24H  . enoxaparin (LOVENOX) injection  40 mg Subcutaneous Daily  . multivitamin with minerals  1 tablet Oral Daily  . tamsulosin  0.4 mg Oral Daily   Continuous Infusions: . sodium  chloride 75 mL/hr at 11/18/15 1529    Principal Problem:   Pneumonia Active Problems:   ARF (acute renal failure) (HCC)   HTN (hypertension)   Dyslipidemia   BPH (benign prostatic hyperplasia)   PNA (pneumonia)    Matthew Berger, Matthew Berger  [facility] Pager [phone]. If 7PM-7AM, please contact night-coverage at [email or web address], password [facility] 11/18/2015, 4:24 PM  LOS: 1 day

## 2015-11-18 NOTE — Progress Notes (Signed)
Called ED for report; spoke with Amy.  Amy stated that she will call me back.

## 2015-11-18 NOTE — Progress Notes (Signed)
Educated patient on sputum collection and left sputum cup at bedside. Patient states that he is not able to cough anything up at this time.

## 2015-11-18 NOTE — Progress Notes (Signed)
11/18/2015 7:15 PM  Assisted primary RN with management of patient.  Per EKG patient in new onset afib with RVR, heart rate at 103.  Pt denies SOB, chest pain or discomfort at this time, other vitals stable.  A few minutes later, patient heart rate remained afib and increased to 120s-130s sustaining.  Pt still completely asymptomatic.  Dr. Wyline Copas notified, orders received for PO metoprolol.  Administered medication per order.  Heart rate currently anywhere from 100s-110s.  Dr. Wyline Copas made verbally aware while on the floor.  Rapid response RN also contacted to have the patient on their radar if his heart rate were to increase again.    Princella Pellegrini

## 2015-11-18 NOTE — Progress Notes (Signed)
Utilization Review Completed.Donne Anon T2/16/2017

## 2015-11-19 ENCOUNTER — Inpatient Hospital Stay (HOSPITAL_COMMUNITY): Payer: Medicare Other

## 2015-11-19 ENCOUNTER — Encounter (HOSPITAL_COMMUNITY)
Admission: EM | Disposition: A | Discharge status: Home / Self Care | Payer: Self-pay | Source: Home / Self Care | Attending: Internal Medicine

## 2015-11-19 DIAGNOSIS — C3490 Malignant neoplasm of unspecified part of unspecified bronchus or lung: Secondary | ICD-10-CM

## 2015-11-19 DIAGNOSIS — J189 Pneumonia, unspecified organism: Secondary | ICD-10-CM

## 2015-11-19 DIAGNOSIS — R918 Other nonspecific abnormal finding of lung field: Secondary | ICD-10-CM

## 2015-11-19 HISTORY — PX: VIDEO BRONCHOSCOPY: SHX5072

## 2015-11-19 HISTORY — DX: Malignant neoplasm of unspecified part of unspecified bronchus or lung: C34.90

## 2015-11-19 LAB — URINE CULTURE

## 2015-11-19 LAB — GRAM STAIN

## 2015-11-19 LAB — LEGIONELLA ANTIGEN, URINE

## 2015-11-19 LAB — HIV ANTIBODY (ROUTINE TESTING W REFLEX): HIV SCREEN 4TH GENERATION: NONREACTIVE

## 2015-11-19 SURGERY — BRONCHOSCOPY, WITH FLUOROSCOPY
Anesthesia: Moderate Sedation | Laterality: Bilateral

## 2015-11-19 MED ORDER — BUTAMBEN-TETRACAINE-BENZOCAINE 2-2-14 % EX AERO: 1.0000 | INHALATION_SPRAY | Freq: Once | CUTANEOUS | Status: DC

## 2015-11-19 MED ORDER — PHENYLEPHRINE HCL 0.25 % NA SOLN
NASAL | Status: DC | PRN
  Administered 2015-11-19: 2 via NASAL

## 2015-11-19 MED ORDER — SODIUM CHLORIDE 0.9 % IV SOLN
INTRAVENOUS | Status: DC
  Administered 2015-11-19: 14:00:00 via INTRAVENOUS

## 2015-11-19 MED ORDER — LIDOCAINE VISCOUS 2 % MT SOLN
OROMUCOSAL | Status: DC | PRN
  Administered 2015-11-19: 1

## 2015-11-19 MED ORDER — LIDOCAINE HCL (PF) 1 % IJ SOLN
INTRAMUSCULAR | Status: DC | PRN
  Administered 2015-11-19: 6 mL

## 2015-11-19 MED ORDER — PHENYLEPHRINE HCL 0.25 % NA SOLN: 1.0000 | Freq: Four times a day (QID) | NASAL | Status: DC | PRN

## 2015-11-19 MED ORDER — LIDOCAINE HCL 2 % EX GEL: 1.0000 "application " | Freq: Once | CUTANEOUS | Status: DC

## 2015-11-19 MED ORDER — FENTANYL CITRATE (PF) 100 MCG/2ML IJ SOLN
INTRAMUSCULAR | Status: AC
  Filled 2015-11-19: qty 4

## 2015-11-19 MED ORDER — MIDAZOLAM HCL 10 MG/2ML IJ SOLN
INTRAMUSCULAR | Status: DC | PRN
  Administered 2015-11-19 (×3): 1 mg via INTRAVENOUS

## 2015-11-19 MED ORDER — IOHEXOL 300 MG/ML  SOLN
75.0000 mL | Freq: Once | INTRAMUSCULAR | Status: AC | PRN
Start: 2015-11-19 — End: 2015-11-19
  Administered 2015-11-19: 75 mL via INTRAVENOUS

## 2015-11-19 MED ORDER — MIDAZOLAM HCL 5 MG/ML IJ SOLN
INTRAMUSCULAR | Status: AC
  Filled 2015-11-19: qty 2

## 2015-11-19 MED ORDER — FENTANYL CITRATE (PF) 100 MCG/2ML IJ SOLN
25.0000 ug | INTRAMUSCULAR | Status: DC | PRN
  Administered 2015-11-19 (×3): 25 ug via INTRAVENOUS

## 2015-11-19 NOTE — Progress Notes (Signed)
Indication: LLL pulmonary infiltrates in the 80 -year-old ex smoker  Written informed consent was obtained from the patient prior to the procedure. The risks of the procedure including coughing, bleeding and a small chance of lung cancer requiring a chest tube were discussed with the patient in great detail and evidenced understanding.  3 mg of Versed and  52mg of fentanyl were used in divided doses during the procedure. Bronchoscope was inserted from the right Nare. The upper airway appeared normal. Vocal cord showed normal appearance in motion. The trachea bronchial tree was then examined to the subsegmental level. Moderate white  secretions were noted. No endobronchial lesions were noted.  Attention was then turned to the left lower lobe. Bronchoalveolar lavage was obtained from the superior segment of the  lower lobe with good return. Transbronchial biopsies x3 were obtained from the sup segment of LLL & other subsegments of the left lower lobe. The patient tolerated procedure well with minimal bleeding. BAL was also obtained from the RUL  A portable chest XR. will be performed to rule out presence of pneumothorax. He was awake and alert in the end of the procedure.  Can resume diet in 2h ALVA,RAKESH V. MD [phone]

## 2015-11-19 NOTE — Care Management Important Message (Signed)
Important Message  Patient Details  Name: Matthew Berger MRN: [id number] Date of Birth: [birth year]   Medicare Important Message Given:  Yes    Nicolus Ose P Shalaya Swailes 11/19/2015, 11:54 AM

## 2015-11-19 NOTE — Progress Notes (Signed)
[facility] PROGRESS NOTE  Matthew Berger OLM:[id number] DOB: December 23, 1935 DOA: 11/17/2015 PCP: [name], MD  HPI/Brief narrative 80 y.o. male with a Past Medical History of hypertension, BPH who presents to the ED with over 2 mos hx of persistent pneumonia, failing outpatient treatment.   Assessment/Plan: . Persistent left-sided infiltrate: Ex-smoker-empirically started Rocephin and Zithromax for possible community-acquired Pneumonia. Creatinine was mildly elevated, improved with IVF hydration. Patient s/p chest ct in 1/17 with findings of persistent pneumonia and with recs for repeat study in 1-2 months. Repeat CT chest with contrast on 2/17 with concerns of multiple pulm nodules and possible bone mets involving T10 and L2. Consulted Pulmonary and pt is now s/p VBAL with cytology and cultures pending. Concerns for malignancy vs reactive changes from pneumonia  . Mild ARF (acute renal failure): Likely mild prerenal azotemia secondary to above. Improved with hydration. Blood pressure was soft on admission, held all antihypertensives.  Marland Kitchen HTN (hypertension): Held antihypertensives on admit-blood pressure was soft on initial presentation  . Dyslipidemia: Continue statin   . BPH (benign prostatic hyperplasia): Continue Flomax  Code Status: Full Family Communication: Pt in room Disposition Plan: Unclear at this time   Consultants:  Pulmonary  Procedures:  VBAL 2/17  Antibiotics: Anti-infectives    Start     Dose/Rate Route Frequency Ordered Stop   11/18/15 1200  cefTRIAXone (ROCEPHIN) 1 g in dextrose 5 % 50 mL IVPB  Status:  Discontinued     1 g 100 mL/hr over 30 Minutes Intravenous Every 24 hours 11/17/15 1224 11/18/15 0020   11/18/15 1200  azithromycin (ZITHROMAX) 500 mg in dextrose 5 % 250 mL IVPB  Status:  Discontinued     500 mg 250 mL/hr over 60 Minutes Intravenous Every 24 hours 11/17/15 1224 11/18/15 0020   11/18/15 0020  cefTRIAXone (ROCEPHIN) 1 g in dextrose 5 %  50 mL IVPB     1 g 100 mL/hr over 30 Minutes Intravenous Every 24 hours 11/18/15 0020     11/18/15 0020  azithromycin (ZITHROMAX) 500 mg in dextrose 5 % 250 mL IVPB     500 mg 250 mL/hr over 60 Minutes Intravenous Every 24 hours 11/18/15 0020     11/17/15 1200  cefTRIAXone (ROCEPHIN) 1 g in dextrose 5 % 50 mL IVPB     1 g 100 mL/hr over 30 Minutes Intravenous  Once 11/17/15 1158 11/18/15 0826   11/17/15 1200  azithromycin (ZITHROMAX) 500 mg in dextrose 5 % 250 mL IVPB     500 mg 250 mL/hr over 60 Minutes Intravenous  Once 11/17/15 1158 11/18/15 0826      HPI/Subjective: States feeling better today  Objective: Filed Vitals:   11/19/15 1515 11/19/15 1520 11/19/15 1525 11/19/15 1729  BP: 138/82 126/76 146/91 120/69  Pulse: 78 81 81 73  Temp:    98.7 F (37.1 C)  TempSrc:    Oral  Resp: '20 19 22 20  '$ Height:      Weight:      SpO2: 93% 94% 93% 95%    Intake/Output Summary (Last 24 hours) at 11/19/15 1826 Last data filed at 11/19/15 1518  Gross per 24 hour  Intake   1000 ml  Output   2575 ml  Net  -1575 ml   Filed Weights   11/18/15 0853 11/18/15 2024 11/19/15 1342  Weight: 111.4 kg (245 lb 9.5 oz) 109 kg (240 lb 4.8 oz) 111.131 kg (245 lb)    Exam:   General:  Awake, in nad, laying  in bed  Cardiovascular: regular, s1, s2  Respiratory: normal resp effort, no wheezing  Abdomen: soft, nondistended  Musculoskeletal: perfused, no clubbing, no cyanosis  Data Reviewed: Basic Metabolic Panel:  Recent Labs Lab 11/17/15 1143 11/18/15 0036  NA 140  --   K 4.0  --   CL 108  --   CO2 21*  --   GLUCOSE 161*  --   BUN 18  --   CREATININE 1.37* 1.03  CALCIUM 9.0  --    Liver Function Tests:  Recent Labs Lab 11/17/15 1143  AST 32  ALT 19  ALKPHOS 77  BILITOT 0.5  PROT 7.2  ALBUMIN 3.8   No results for input(s): LIPASE, AMYLASE in the last 168 hours. No results for input(s): AMMONIA in the last 168 hours. CBC:  Recent Labs Lab 11/17/15 1411  11/18/15 0036  WBC 3.4* 6.6  NEUTROABS 2.1  --   HGB 13.4 13.9  HCT 39.8 41.3  MCV 89.2 90.4  PLT 116* 110*   Cardiac Enzymes: No results for input(s): CKTOTAL, CKMB, CKMBINDEX, TROPONINI in the last 168 hours. BNP (last 3 results) No results for input(s): BNP in the last 8760 hours.  ProBNP (last 3 results) No results for input(s): PROBNP in the last 8760 hours.  CBG: No results for input(s): GLUCAP in the last 168 hours.  Recent Results (from the past 240 hour(s))  Blood Culture (routine x 2)     Status: None (Preliminary result)   Collection Time: 11/17/15 12:25 PM  Result Value Ref Range Status   Specimen Description BLOOD RIGHT ANTECUBITAL  Final   Special Requests BOTTLES DRAWN AEROBIC AND ANAEROBIC 5CC  Final   Culture NO GROWTH 2 DAYS  Final   Report Status PENDING  Incomplete  Blood Culture (routine x 2)     Status: None (Preliminary result)   Collection Time: 11/17/15 12:35 PM  Result Value Ref Range Status   Specimen Description BLOOD RIGHT HAND  Final   Special Requests BOTTLES DRAWN AEROBIC AND ANAEROBIC 5CC  Final   Culture NO GROWTH 2 DAYS  Final   Report Status PENDING  Incomplete  Urine culture     Status: None   Collection Time: 11/17/15  4:56 PM  Result Value Ref Range Status   Specimen Description URINE, CLEAN CATCH  Final   Special Requests NONE  Final   Culture 6,000 COLONIES/mL INSIGNIFICANT GROWTH  Final   Report Status 11/19/2015 FINAL  Final     Studies: Ct Chest W Contrast  11/19/2015  CLINICAL DATA:  Lung metastases.  Atrial fibrillation EXAM: CT CHEST WITH CONTRAST TECHNIQUE: Multidetector CT imaging of the chest was performed during intravenous contrast administration. CONTRAST:  81m OMNIPAQUE IOHEXOL 300 MG/ML  SOLN COMPARISON:  Chest CT October 06, 2015; chest radiograph November 17, 2015 FINDINGS: Mediastinum/Lymph Nodes: Thyroid appears normal. There are several stable subcentimeter mediastinal lymph nodes. There is a focal lymph node in  the right hilum which appears slightly larger than on recent prior study. This lymph node currently measures 2.0 x 1.8 cm. There is a mildly prominent sub- carinal lymph node measuring 1.7 x 1.5 cm. There is no thoracic aortic aneurysm or dissection. The visualized great vessels appear normal. There are scattered foci of atherosclerotic change in aorta. Pericardium is upper limits normal in thickness. Lungs/Pleura: There is extensive left lower lobe consolidation, also present previously. Multiple parenchymal lung nodular opacities are noted throughout the lungs bilaterally. There is a nodular lesion in the anterior segment  of the right upper lobe currently measuring 1.5 x 1.2 cm, compared to 1.3 x 1.0 cm on the prior study. Multiple other nodular lesions appear either stable or slightly increased in size compared to 6 weeks prior. There is relative confluence in the right upper lobe anteriorly compared to the previous study, felt to be indicative of progression of neoplastic change in this area. There are smaller scattered nodular lesions in the left upper lobe with the largest lesion in this area measuring 5 mm. In the right middle lobe, there are new small nodular opacities as well as a slightly irregular ground-glass type opacity measuring 1.6 x 1.0 cm. Multiple nodular lesions are noted in the left lower lobe with several nodular lesions in this area larger. The largest individual nodular lesion in the left lower lobe currently measures 1.3 x 1.1 cm; on the recent prior study, this same nodular lesion measured 1.1 x 0.8 cm. Upper abdomen: In the visualized upper abdomen, there is hepatic steatosis. No adrenal lesions are identified. There is atherosclerotic change in the upper abdominal aorta. Musculoskeletal: There is a mixed sclerotic and lytic lesion involving the T10 vertebral body and pedicles bilaterally. There is also mixed sclerosis and lucency in the L2 vertebral body, incompletely visualized.  IMPRESSION: There are multiple pulmonary nodular lesions which have increased in number compared to 6 weeks prior. Several nodular lesions that were present previously are slightly increased in size. There is a new enlarged lymph node in the right hilum. There is also a prominent lymph node in the sub- carinal region which may be marginally more prominent than on prior study. 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 There is continued airspace consolidation throughout much of the left lower lobe, stable. Electronically Signed   By: Lowella Grip III M.D.   On: 11/19/2015 07:06   Dg Chest Port 1 View  11/19/2015  CLINICAL DATA:  Post bronchoscopy EXAM: PORTABLE CHEST 1 VIEW COMPARISON:  2/15/ 17 FINDINGS: Cardiomediastinal silhouette is stable. Persistent infiltrate in left lower lobe retro hilar region. Vague nodules bilateral upper lobe are poorly visualized. There is no pneumothorax. No pulmonary edema or pleural effusion. IMPRESSION: Persistent infiltrate in left lower lobe retro hilar region. Vague nodules bilateral upper lobe are poorly visualized. There is no pneumothorax. Electronically Signed   By: Lahoma Crocker M.D.   On: 11/19/2015 15:28   Dg C-arm Bronchoscopy  11/19/2015  CLINICAL DATA:  C-ARM BRONCHOSCOPY Fluoroscopy was utilized by the requesting physician.  No radiographic interpretation.    Scheduled Meds: . antiseptic oral rinse  7 mL Mouth Rinse BID  . aspirin EC  81 mg Oral Daily  . atorvastatin  40 mg Oral Daily  . azithromycin  500 mg Intravenous Q24H  . cefTRIAXone (ROCEPHIN)  IV  1 g Intravenous Q24H  . enoxaparin (LOVENOX) injection  40 mg Subcutaneous Daily  . metoprolol tartrate  25 mg Oral BID  . multivitamin with minerals  1 tablet Oral Daily  . tamsulosin  0.4 mg Oral Daily   Continuous Infusions:    Principal Problem:   Pneumonia Active  Problems:   ARF (acute renal failure) (HCC)   HTN (hypertension)   Dyslipidemia   BPH (benign prostatic hyperplasia)   PNA (pneumonia)   Pulmonary nodules/lesions, multiple    CHIU, STEPHEN K  [facility] Pager [phone]. If 7PM-7AM, please contact night-coverage at [email or web address], password [facility] 11/19/2015, 6:26  PM  LOS: 2 days

## 2015-11-19 NOTE — Consult Note (Signed)
Name: Matthew Berger MRN: [id number] DOB: [birth year]    ADMISSION DATE:  11/17/2015 CONSULTATION DATE:  2/17  REFERRING MD :  Wyline Copas   CHIEF COMPLAINT:  Abnormal CT chest   BRIEF PATIENT DESCRIPTION:  This is a 80 year old male who was diagnosed and treated for a Pneumonia back in Dec 2016. Since that time he has had several course of abx w/out resolution of symptoms. Had CT back in Jan 17 showed the LLL airspace disease and scattered pulmonary nodules. Admitted for failure to respond to out-pt rx on 2/15. A CT of chest was obtained which showed no sig change in the LLL airspace disease but worsening diffuse pulmonary nodules that had increased in Number and size. Also there was newly identified lytic lesions of T10 and L2. PCCM was asked to eval.   SIGNIFICANT EVENTS    STUDIES:  CT chest 2/17: There are multiple pulmonary nodular lesions which have increased in number compared to 6 weeks prior. Several nodular lesions that were present previously are slightly increased in size. There is a new enlarged lymph node in the right hilum. There is also a prominent lymph node in the sub- carinal region which may be marginally more prominent than on prior study. 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 There is continued airspace consolidation throughout much of the left lower lobe, stable.   HISTORY OF PRESENT ILLNESS:   This is a 80 year old male who was admitted on 2/15 w/ cc: cough. Per pt had been dx'd w/ PNA back in Dec 2016 via CXR and was treated w/ ABX. On f/u he continued to have cough was told he still had PNA and his antibiotic course was continued another week. His symptoms persisted in spite of therapy. He had a CT of chest on 1/4 this showed dense consolidation of the LLL and numerous pulmonary nodules t/o both lungs. He continued to have weakness and non-productive  cough. He was admitted on 2/15 w/ these symptoms above as well as worsening weakness & decreased appetite. He was placed on IV abx, provided IV hydration for mild AKI and an repeat CT chest was obtained on 2/17. This showed no change in the LLL, as well as increase in the number of nodules as well as size t/o both lungs. PCCM was asked to see in consult given concern for malignant process.  PAST MEDICAL HISTORY :   has a past medical history of Hypertension; Hypercholesteremia; Atrial fibrillation ([place]); Pneumonia (11/18/2015); and GERD (gastroesophageal reflux disease).  has past surgical history that includes Wisdom tooth extraction and Rotator cuff repair (Left). Prior to Admission medications   Medication Sig Start Date End Date Taking? Authorizing Provider  aspirin 81 MG tablet Take 81 mg by mouth daily.   Yes Historical Provider, MD  atorvastatin (LIPITOR) 40 MG tablet Take 40 mg by mouth daily.   Yes Historical Provider, MD  diltiazem (DILACOR XR) 180 MG 24 hr capsule Take 180 mg by mouth daily.   Yes Historical Provider, MD  metoprolol tartrate (LOPRESSOR) 25 MG tablet Take 25 mg by mouth 2 (two) times daily.   Yes Historical Provider, MD  Multiple Vitamins-Minerals (MULTIVITAMIN WITH MINERALS) tablet Take 1 tablet by mouth daily.   Yes Historical Provider, MD  naproxen sodium (ALEVE) 220 MG tablet Take 220 mg by mouth 2 (two) times daily with a meal.   Yes Historical  Provider, MD  tamsulosin (FLOMAX) 0.4 MG CAPS capsule Take 0.4 mg by mouth daily.   Yes Historical Provider, MD   No Known Allergies  FAMILY HISTORY:  family history is not on file. SOCIAL HISTORY:  reports that he has quit smoking. His smoking use included Cigarettes. He has never used smokeless tobacco. He reports that he does not drink alcohol or use illicit drugs.  REVIEW OF SYSTEMS:   Constitutional: Negative for fever, chills, weight loss, +malaise/fatigue and diaphoresis.  HENT: Negative for hearing loss, ear pain,  nosebleeds, congestion, sore throat, neck pain, tinnitus and ear discharge.   Eyes: Negative for blurred vision, double vision, photophobia, pain, discharge and redness.  Respiratory: Non productive cough, no hemoptysis, no sputum production, increased shortness of breath w/ exertion, wheezing and stridor.   Cardiovascular: Negative for chest pain, palpitations, orthopnea, claudication, leg swelling and PND.  Gastrointestinal: Negative for heartburn, nausea, vomiting, abdominal pain, diarrhea, constipation, blood in stool and melena.  Genitourinary: Negative for dysuria, urgency, frequency, hematuria and flank pain.  Musculoskeletal: Negative for myalgias, back pain, joint pain and falls.  Skin: Negative for itching and rash.  Neurological: Negative for dizziness, tingling, tremors, sensory change, speech change, focal weakness, seizures, loss of consciousness, weakness and headaches.  Endo/Heme/Allergies: Negative for environmental allergies and polydipsia. Does not bruise/bleed easily.  SUBJECTIVE:  No distress  VITAL SIGNS: Temp:  [98.4 F (36.9 C)-99.8 F (37.7 C)] 98.4 F (36.9 C) (02/17 0923) Pulse Rate:  [70-87] 77 (02/17 0923) Resp:  [18-20] 18 (02/17 0923) BP: (137-140)/(49-78) 140/49 mmHg (02/17 0923) SpO2:  [99 %-100 %] 100 % (02/17 0923) Weight:  [240 lb 4.8 oz (109 kg)] 240 lb 4.8 oz (109 kg) (02/16 2024)  PHYSICAL EXAMINATION: General:  80 year old male, resting comfortably in bed. No acute distress Neuro:  Awake, oriented x3, no focal def  HEENT:  MMM, no nasal flaring, no JVD  Cardiovascular:  Rrr, no MRG Lungs:  Clear, no accessory muscle use  Abdomen:  Soft, not tender, no OM, + bowel sounds Musculoskeletal:  Equal st and bulk  Skin:  Warm and dry    Recent Labs Lab 11/17/15 1143 11/18/15 0036  NA 140  --   K 4.0  --   CL 108  --   CO2 21*  --   BUN 18  --   CREATININE 1.37* 1.03  GLUCOSE 161*  --     Recent Labs Lab 11/17/15 1411 11/18/15 0036    HGB 13.4 13.9  HCT 39.8 41.3  WBC 3.4* 6.6  PLT 116* 110*   Dg Chest 2 View  11/17/2015  CLINICAL DATA:  Cough, 4 days duration. Recent history of pneumonia. EXAM: CHEST  2 VIEW COMPARISON:  11/12/2015.  09/29/2015. FINDINGS: Infiltrate in the left perihilar region and left lower lobe have worsened. Multiple nodular shadows demonstrated at CT are subtly visible on the chest radiograph. No effusions. No bony abnormalities. Atherosclerosis affects the aorta. IMPRESSION: Worsened infiltrate in the left perihilar region and left lower lobe. Persistent faint nodular opacities in both lungs. Though there is almost certainly a component of pneumonia, I am concerned about malignancy and metastatic disease in this case. I would suggest that bronchoscopy at least be considered in this case. Electronically Signed   By: Nelson Chimes M.D.   On: 11/17/2015 11:55   Ct Chest W Contrast  11/19/2015  CLINICAL DATA:  Lung metastases.  Atrial fibrillation EXAM: CT CHEST WITH CONTRAST TECHNIQUE: Multidetector CT imaging of the chest  was performed during intravenous contrast administration. CONTRAST:  22m OMNIPAQUE IOHEXOL 300 MG/ML  SOLN COMPARISON:  Chest CT October 06, 2015; chest radiograph November 17, 2015 FINDINGS: Mediastinum/Lymph Nodes: Thyroid appears normal. There are several stable subcentimeter mediastinal lymph nodes. There is a focal lymph node in the right hilum which appears slightly larger than on recent prior study. This lymph node currently measures 2.0 x 1.8 cm. There is a mildly prominent sub- carinal lymph node measuring 1.7 x 1.5 cm. There is no thoracic aortic aneurysm or dissection. The visualized great vessels appear normal. There are scattered foci of atherosclerotic change in aorta. Pericardium is upper limits normal in thickness. Lungs/Pleura: There is extensive left lower lobe consolidation, also present previously. Multiple parenchymal lung nodular opacities are noted throughout the lungs  bilaterally. There is a nodular lesion in the anterior segment of the right upper lobe currently measuring 1.5 x 1.2 cm, compared to 1.3 x 1.0 cm on the prior study. Multiple other nodular lesions appear either stable or slightly increased in size compared to 6 weeks prior. There is relative confluence in the right upper lobe anteriorly compared to the previous study, felt to be indicative of progression of neoplastic change in this area. There are smaller scattered nodular lesions in the left upper lobe with the largest lesion in this area measuring 5 mm. In the right middle lobe, there are new small nodular opacities as well as a slightly irregular ground-glass type opacity measuring 1.6 x 1.0 cm. Multiple nodular lesions are noted in the left lower lobe with several nodular lesions in this area larger. The largest individual nodular lesion in the left lower lobe currently measures 1.3 x 1.1 cm; on the recent prior study, this same nodular lesion measured 1.1 x 0.8 cm. Upper abdomen: In the visualized upper abdomen, there is hepatic steatosis. No adrenal lesions are identified. There is atherosclerotic change in the upper abdominal aorta. Musculoskeletal: There is a mixed sclerotic and lytic lesion involving the T10 vertebral body and pedicles bilaterally. There is also mixed sclerosis and lucency in the L2 vertebral body, incompletely visualized. IMPRESSION: There are multiple pulmonary nodular lesions which have increased in number compared to 6 weeks prior. Several nodular lesions that were present previously are slightly increased in size. There is a new enlarged lymph node in the right hilum. There is also a prominent lymph node in the sub- carinal region which may be marginally more prominent than on prior study. 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 There is continued airspace  consolidation throughout much of the left lower lobe, stable. Electronically Signed   By: WLowella GripIII M.D.   On: 11/19/2015 07:06    ASSESSMENT / PLAN: Persistent LLL airspace disease Possible PNA  Multiple bilateral pulmonary nodules of increasing size and number Lytic lesions involving the T10 and L2 area.   Discussion.  Concerned about the ideal target area. But almost certainly this is an metastatic malignancy.  Plan Will start w/ FOB of the LLL w/ wash and TBBx ->if non-diagnostic then would first do PET, then consider EBUS Cont current abx  PErick ColaceACNP-BC LCusterPager # [phone] # [phone] no answer  11/19/2015, 9:32 AM

## 2015-11-19 NOTE — Progress Notes (Signed)
Video bronchoscopy performed.  Intervention bronchial washing. Intervention bronchial brushing. Intervention bronchial biopsy.  No complications noted.  Will continue to monitor. 

## 2015-11-20 DIAGNOSIS — N170 Acute kidney failure with tubular necrosis: Secondary | ICD-10-CM

## 2015-11-20 LAB — CBC
HEMATOCRIT: 38 % — AB (ref 39.0–52.0)
Hemoglobin: 12.9 g/dL — ABNORMAL LOW (ref 13.0–17.0)
MCH: 30.1 pg (ref 26.0–34.0)
MCHC: 33.9 g/dL (ref 30.0–36.0)
MCV: 88.6 fL (ref 78.0–100.0)
PLATELETS: 117 10*3/uL — AB (ref 150–400)
RBC: 4.29 MIL/uL (ref 4.22–5.81)
RDW: 12.9 % (ref 11.5–15.5)
WBC: 3.9 10*3/uL — AB (ref 4.0–10.5)

## 2015-11-20 LAB — BASIC METABOLIC PANEL
Anion gap: 9 (ref 5–15)
BUN: 11 mg/dL (ref 6–20)
CALCIUM: 8.7 mg/dL — AB (ref 8.9–10.3)
CO2: 26 mmol/L (ref 22–32)
CREATININE: 0.99 mg/dL (ref 0.61–1.24)
Chloride: 106 mmol/L (ref 101–111)
GFR calc Af Amer: >60 mL/min (ref >60)
GLUCOSE: 103 mg/dL — AB (ref 65–99)
POTASSIUM: 3.8 mmol/L (ref 3.5–5.1)
SODIUM: 141 mmol/L (ref 135–145)

## 2015-11-20 NOTE — Progress Notes (Signed)
PCCM Interval Note  Await FOB results from 2/17, cytology, cultures, TBBx pathology are all pending.  CXR from 2/17 post-FOB without PTX.  We will follow for results with you.   Baltazar Apo, MD, PhD 11/20/2015, 11:46 AM [facility] [phone] or if no answer [phone]

## 2015-11-20 NOTE — Progress Notes (Addendum)
[facility] PROGRESS NOTE  Matthew Berger FXT:[id number] DOB: 04/25/1936 DOA: 11/17/2015 PCP: [name], MD  HPI/Brief narrative 80 y.o. male with a Past Medical History of hypertension, BPH who presents to the ED with over 2 mos hx of persistent pneumonia, failing outpatient treatment.   Assessment/Plan: . Persistent left-sided infiltrate: Ex-smoker-empirically started Rocephin and Zithromax for possible community-acquired Pneumonia. Creatinine was mildly elevated, improved with IVF hydration. Patient s/p chest ct in 1/17 with findings of persistent pneumonia and with recs for repeat study in 1-2 months. Repeat CT chest with contrast on 2/17 with concerns of multiple pulm nodules and possible bone mets involving T10 and L2. Pulmonary now following and pt is now s/p VBAL with cytology and cultures pending. Concerns for malignancy vs reactive changes from pneumonia  . Mild ARF (acute renal failure): Likely mild prerenal azotemia secondary to above. Improved with hydration. Blood pressure was soft on admission, held all antihypertensives.  Marland Kitchen HTN (hypertension): Held antihypertensives on admit-blood pressure was soft on initial presentation  . Dyslipidemia: Continue statin   . BPH (benign prostatic hyperplasia): Continue Flomax  Recent Afib with RVR - No documented hx of afib - Currently on ASA - CHADS-VASc of 3 (age over 78, HTN). Would benefit from full anticoagulation, however, as pt is process of malignancy workup, would keep on ASA alone for now. Consider eliquis closer to d/c  - Check 2d echo - Check TSH in AM  Code Status: Full Family Communication: Pt in room, family at bedside Disposition Plan: Unclear at this time   Consultants:  Pulmonary  Procedures:  VBAL 2/17  Antibiotics: Anti-infectives    Start     Dose/Rate Route Frequency Ordered Stop   11/18/15 1200  cefTRIAXone (ROCEPHIN) 1 g in dextrose 5 % 50 mL IVPB  Status:  Discontinued     1 g 100 mL/hr  over 30 Minutes Intravenous Every 24 hours 11/17/15 1224 11/18/15 0020   11/18/15 1200  azithromycin (ZITHROMAX) 500 mg in dextrose 5 % 250 mL IVPB  Status:  Discontinued     500 mg 250 mL/hr over 60 Minutes Intravenous Every 24 hours 11/17/15 1224 11/18/15 0020   11/18/15 0020  cefTRIAXone (ROCEPHIN) 1 g in dextrose 5 % 50 mL IVPB     1 g 100 mL/hr over 30 Minutes Intravenous Every 24 hours 11/18/15 0020     11/18/15 0020  azithromycin (ZITHROMAX) 500 mg in dextrose 5 % 250 mL IVPB     500 mg 250 mL/hr over 60 Minutes Intravenous Every 24 hours 11/18/15 0020     11/17/15 1200  cefTRIAXone (ROCEPHIN) 1 g in dextrose 5 % 50 mL IVPB     1 g 100 mL/hr over 30 Minutes Intravenous  Once 11/17/15 1158 11/18/15 0826   11/17/15 1200  azithromycin (ZITHROMAX) 500 mg in dextrose 5 % 250 mL IVPB     500 mg 250 mL/hr over 60 Minutes Intravenous  Once 11/17/15 1158 11/18/15 0826      HPI/Subjective: In good spirits. Patient reports feeing overall better this AM  Objective: Filed Vitals:   11/19/15 1729 11/19/15 2027 11/20/15 0422 11/20/15 0900  BP: 120/69 123/68 141/74 119/74  Pulse: 73 81 67 90  Temp: 98.7 F (37.1 C) 98.1 F (36.7 C) 98.9 F (37.2 C) 98.2 F (36.8 C)  TempSrc: Oral   Oral  Resp: '20 21 20 18  '$ Height:      Weight:   110.224 kg (243 lb)   SpO2: 95% 99% 99% 97%  Intake/Output Summary (Last 24 hours) at 11/20/15 1546 Last data filed at 11/20/15 1500  Gross per 24 hour  Intake   1242 ml  Output   1150 ml  Net     92 ml   Filed Weights   11/18/15 2024 11/19/15 1342 11/20/15 0422  Weight: 109 kg (240 lb 4.8 oz) 111.131 kg (245 lb) 110.224 kg (243 lb)    Exam:   General:  Awake, in nad, laying in bed  Cardiovascular: regular, s1, s2  Respiratory: normal resp effort, no wheezing  Abdomen: soft, nondistended, pos BS  Musculoskeletal: perfused, no cyanosis  Data Reviewed: Basic Metabolic Panel:  Recent Labs Lab 11/17/15 1143 11/18/15 0036  11/20/15 0630  NA 140  --  141  K 4.0  --  3.8  CL 108  --  106  CO2 21*  --  26  GLUCOSE 161*  --  103*  BUN 18  --  11  CREATININE 1.37* 1.03 0.99  CALCIUM 9.0  --  8.7*   Liver Function Tests:  Recent Labs Lab 11/17/15 1143  AST 32  ALT 19  ALKPHOS 77  BILITOT 0.5  PROT 7.2  ALBUMIN 3.8   No results for input(s): LIPASE, AMYLASE in the last 168 hours. No results for input(s): AMMONIA in the last 168 hours. CBC:  Recent Labs Lab 11/17/15 1411 11/18/15 0036 11/20/15 0630  WBC 3.4* 6.6 3.9*  NEUTROABS 2.1  --   --   HGB 13.4 13.9 12.9*  HCT 39.8 41.3 38.0*  MCV 89.2 90.4 88.6  PLT 116* 110* 117*   Cardiac Enzymes: No results for input(s): CKTOTAL, CKMB, CKMBINDEX, TROPONINI in the last 168 hours. BNP (last 3 results) No results for input(s): BNP in the last 8760 hours.  ProBNP (last 3 results) No results for input(s): PROBNP in the last 8760 hours.  CBG: No results for input(s): GLUCAP in the last 168 hours.  Recent Results (from the past 240 hour(s))  Blood Culture (routine x 2)     Status: None (Preliminary result)   Collection Time: 11/17/15 12:25 PM  Result Value Ref Range Status   Specimen Description BLOOD RIGHT ANTECUBITAL  Final   Special Requests BOTTLES DRAWN AEROBIC AND ANAEROBIC 5CC  Final   Culture NO GROWTH 3 DAYS  Final   Report Status PENDING  Incomplete  Blood Culture (routine x 2)     Status: None (Preliminary result)   Collection Time: 11/17/15 12:35 PM  Result Value Ref Range Status   Specimen Description BLOOD RIGHT HAND  Final   Special Requests BOTTLES DRAWN AEROBIC AND ANAEROBIC 5CC  Final   Culture NO GROWTH 3 DAYS  Final   Report Status PENDING  Incomplete  Urine culture     Status: None   Collection Time: 11/17/15  4:56 PM  Result Value Ref Range Status   Specimen Description URINE, CLEAN CATCH  Final   Special Requests NONE  Final   Culture 6,000 COLONIES/mL INSIGNIFICANT GROWTH  Final   Report Status 11/19/2015 FINAL   Final  Gram stain     Status: None   Collection Time: 11/19/15  2:53 PM  Result Value Ref Range Status   Specimen Description BRONCHIAL ALVEOLAR LAVAGE  Final   Special Requests NONE  Final   Gram Stain   Final    ABUNDANT WBC PRESENT, PREDOMINANTLY PMN NO ORGANISMS SEEN    Report Status 11/19/2015 FINAL  Final  Culture, bal-quantitative     Status: None (Preliminary result)  Collection Time: 11/19/15  2:53 PM  Result Value Ref Range Status   Specimen Description BRONCHIAL ALVEOLAR LAVAGE  Final   Special Requests NONE  Final   Gram Stain   Final    ABUNDANT WBC PRESENT,BOTH PMN AND MONONUCLEAR FEW SQUAMOUS EPITHELIAL CELLS PRESENT NO ORGANISMS SEEN Performed at [facility] Performed at [facility]    Culture PENDING  Incomplete   Report Status PENDING  Incomplete  Fungus Culture with Smear     Status: None (Preliminary result)   Collection Time: 11/19/15  2:53 PM  Result Value Ref Range Status   Specimen Description BRONCHIAL ALVEOLAR LAVAGE  Final   Special Requests NONE  Final   Fungal Smear   Final    NO YEAST OR FUNGAL ELEMENTS SEEN Performed at [facility]    Culture   Final    CULTURE IN PROGRESS FOR FOUR WEEKS Performed at [facility]    Report Status PENDING  Incomplete  AFB culture with smear (NOT at [facility])     Status: None (Preliminary result)   Collection Time: 11/19/15  2:53 PM  Result Value Ref Range Status   Specimen Description BRONCHIAL ALVEOLAR LAVAGE  Final   Special Requests NONE  Final   Acid Fast Smear   Final    NO ACID FAST BACILLI SEEN Performed at [facility]    Culture   Final    CULTURE WILL BE EXAMINED FOR 6 WEEKS BEFORE ISSUING A FINAL REPORT Performed at [facility]    Report Status PENDING  Incomplete     Studies: Ct Chest W Contrast  11/19/2015  CLINICAL DATA:  Lung metastases.  Atrial fibrillation EXAM: CT CHEST WITH CONTRAST TECHNIQUE: Multidetector CT imaging of the chest  was performed during intravenous contrast administration. CONTRAST:  32m OMNIPAQUE IOHEXOL 300 MG/ML  SOLN COMPARISON:  Chest CT October 06, 2015; chest radiograph November 17, 2015 FINDINGS: Mediastinum/Lymph Nodes: Thyroid appears normal. There are several stable subcentimeter mediastinal lymph nodes. There is a focal lymph node in the right hilum which appears slightly larger than on recent prior study. This lymph node currently measures 2.0 x 1.8 cm. There is a mildly prominent sub- carinal lymph node measuring 1.7 x 1.5 cm. There is no thoracic aortic aneurysm or dissection. The visualized great vessels appear normal. There are scattered foci of atherosclerotic change in aorta. Pericardium is upper limits normal in thickness. Lungs/Pleura: There is extensive left lower lobe consolidation, also present previously. Multiple parenchymal lung nodular opacities are noted throughout the lungs bilaterally. There is a nodular lesion in the anterior segment of the right upper lobe currently measuring 1.5 x 1.2 cm, compared to 1.3 x 1.0 cm on the prior study. Multiple other nodular lesions appear either stable or slightly increased in size compared to 6 weeks prior. There is relative confluence in the right upper lobe anteriorly compared to the previous study, felt to be indicative of progression of neoplastic change in this area. There are smaller scattered nodular lesions in the left upper lobe with the largest lesion in this area measuring 5 mm. In the right middle lobe, there are new small nodular opacities as well as a slightly irregular ground-glass type opacity measuring 1.6 x 1.0 cm. Multiple nodular lesions are noted in the left lower lobe with several nodular lesions in this area larger. The largest individual nodular lesion in the left lower lobe currently measures 1.3 x 1.1 cm; on the recent prior study, this same  nodular lesion measured 1.1 x 0.8 cm. Upper abdomen: In the visualized upper abdomen, there is  hepatic steatosis. No adrenal lesions are identified. There is atherosclerotic change in the upper abdominal aorta. Musculoskeletal: There is a mixed sclerotic and lytic lesion involving the T10 vertebral body and pedicles bilaterally. There is also mixed sclerosis and lucency in the L2 vertebral body, incompletely visualized. IMPRESSION: There are multiple pulmonary nodular lesions which have increased in number compared to 6 weeks prior. Several nodular lesions that were present previously are slightly increased in size. There is a new enlarged lymph node in the right hilum. There is also a prominent lymph node in the sub- carinal region which may be marginally more prominent than on prior study. 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 There is continued airspace consolidation throughout much of the left lower lobe, stable. Electronically Signed   By: Lowella Grip III M.D.   On: 11/19/2015 07:06   Dg Chest Port 1 View  11/19/2015  CLINICAL DATA:  Post bronchoscopy EXAM: PORTABLE CHEST 1 VIEW COMPARISON:  2/15/ 17 FINDINGS: Cardiomediastinal silhouette is stable. Persistent infiltrate in left lower lobe retro hilar region. Vague nodules bilateral upper lobe are poorly visualized. There is no pneumothorax. No pulmonary edema or pleural effusion. IMPRESSION: Persistent infiltrate in left lower lobe retro hilar region. Vague nodules bilateral upper lobe are poorly visualized. There is no pneumothorax. Electronically Signed   By: Lahoma Crocker M.D.   On: 11/19/2015 15:28   Dg C-arm Bronchoscopy  11/19/2015  CLINICAL DATA:  C-ARM BRONCHOSCOPY Fluoroscopy was utilized by the requesting physician.  No radiographic interpretation.    Scheduled Meds: . antiseptic oral rinse  7 mL Mouth Rinse BID  . aspirin EC  81 mg Oral Daily  . atorvastatin  40 mg Oral Daily  . azithromycin  500 mg  Intravenous Q24H  . cefTRIAXone (ROCEPHIN)  IV  1 g Intravenous Q24H  . enoxaparin (LOVENOX) injection  40 mg Subcutaneous Daily  . metoprolol tartrate  25 mg Oral BID  . multivitamin with minerals  1 tablet Oral Daily  . tamsulosin  0.4 mg Oral Daily   Continuous Infusions:    Principal Problem:   Pneumonia Active Problems:   ARF (acute renal failure) (HCC)   HTN (hypertension)   Dyslipidemia   BPH (benign prostatic hyperplasia)   PNA (pneumonia)   Pulmonary nodules/lesions, multiple    Lima Chillemi K  [facility] Pager [phone]. If 7PM-7AM, please contact night-coverage at [email or web address], password [facility] 11/20/2015, 3:46 PM  LOS: 3 days

## 2015-11-21 LAB — TSH: TSH: 2.013 u[IU]/mL (ref 0.350–4.500)

## 2015-11-21 MED ORDER — AZITHROMYCIN 500 MG PO TABS
500.0000 mg | ORAL_TABLET | ORAL | Status: DC
  Administered 2015-11-21 – 2015-11-23 (×3): 500 mg via ORAL
  Filled 2015-11-21 (×3): qty 1

## 2015-11-21 NOTE — Progress Notes (Signed)
[facility] PROGRESS NOTE  Matthew Berger KPT:[id number] DOB: 1936-08-24 DOA: 11/17/2015 PCP: [name], MD  HPI/Brief narrative 80 y.o. male with a Past Medical History of hypertension, BPH who presents to the ED with over 2 mos hx of persistent pneumonia, failing outpatient treatment.   Assessment/Plan: . Persistent left-sided infiltrate: Ex-smoker-empirically started Rocephin and Zithromax for possible community-acquired Pneumonia. Creatinine was mildly elevated, improved with IVF hydration. Patient s/p chest ct in 1/17 with findings of persistent pneumonia and with recs for repeat study in 1-2 months. Repeat CT chest with contrast on 2/17 with concerns of multiple pulm nodules and possible bone mets involving T10 and L2. Pulmonary now following and pt is now s/p VBAL with cytology and cultures pending. Concerns for malignancy vs reactive changes from pneumonia  . Mild ARF (acute renal failure): Likely mild prerenal azotemia secondary to above. Improved with hydration. Blood pressure was soft on admission, held all antihypertensives.  Marland Kitchen HTN (hypertension): Held antihypertensives on admit-blood pressure was soft on initial presentation  . Dyslipidemia: Continue statin   . BPH (benign prostatic hyperplasia): Continue Flomax  Recent Afib with RVR - No documented hx of afib - Currently on ASA - CHADS-VASc of 3 (age over 54, HTN). Would benefit from full anticoagulation, however, as pt is process of malignancy workup, would keep on ASA alone for now. Consider eliquis closer to d/c  - 2d echo still pending - TSH normal  Code Status: Full Family Communication: Pt in room Disposition Plan: Possible d/c home in 24-48hrs pending results of biopsy   Consultants:  Pulmonary  Procedures:  VBAL 2/17  Antibiotics: Anti-infectives    Start     Dose/Rate Route Frequency Ordered Stop   11/21/15 2200  azithromycin (ZITHROMAX) tablet 500 mg    Comments:  Indication: CAP   500 mg  Oral Every 24 hours 11/21/15 1150     11/18/15 1200  cefTRIAXone (ROCEPHIN) 1 g in dextrose 5 % 50 mL IVPB  Status:  Discontinued     1 g 100 mL/hr over 30 Minutes Intravenous Every 24 hours 11/17/15 1224 11/18/15 0020   11/18/15 1200  azithromycin (ZITHROMAX) 500 mg in dextrose 5 % 250 mL IVPB  Status:  Discontinued     500 mg 250 mL/hr over 60 Minutes Intravenous Every 24 hours 11/17/15 1224 11/18/15 0020   11/18/15 0020  cefTRIAXone (ROCEPHIN) 1 g in dextrose 5 % 50 mL IVPB     1 g 100 mL/hr over 30 Minutes Intravenous Every 24 hours 11/18/15 0020     11/18/15 0020  azithromycin (ZITHROMAX) 500 mg in dextrose 5 % 250 mL IVPB  Status:  Discontinued     500 mg 250 mL/hr over 60 Minutes Intravenous Every 24 hours 11/18/15 0020 11/21/15 1150   11/17/15 1200  cefTRIAXone (ROCEPHIN) 1 g in dextrose 5 % 50 mL IVPB     1 g 100 mL/hr over 30 Minutes Intravenous  Once 11/17/15 1158 11/18/15 0826   11/17/15 1200  azithromycin (ZITHROMAX) 500 mg in dextrose 5 % 250 mL IVPB     500 mg 250 mL/hr over 60 Minutes Intravenous  Once 11/17/15 1158 11/18/15 0826      HPI/Subjective: No complaints at present. Denies sob. States feeling somewhat better  Objective: Filed Vitals:   11/20/15 1812 11/20/15 2006 11/21/15 0409 11/21/15 1009  BP: 119/65 133/64 132/71 108/59  Pulse: 75 83 67 74  Temp: 98.8 F (37.1 C) 98.7 F (37.1 C) 98.2 F (36.8 C) 98.6 F (37 C)  TempSrc: Oral Oral Oral Oral  Resp: '18 19 20 18  '$ Height:      Weight:  110.3 kg (243 lb 2.7 oz)    SpO2: 100% 97% 99% 98%    Intake/Output Summary (Last 24 hours) at 11/21/15 1712 Last data filed at 11/21/15 0900  Gross per 24 hour  Intake    420 ml  Output    500 ml  Net    -80 ml   Filed Weights   11/19/15 1342 11/20/15 0422 11/20/15 2006  Weight: 111.131 kg (245 lb) 110.224 kg (243 lb) 110.3 kg (243 lb 2.7 oz)    Exam:   General:  Awake, in nad, laying in bed  Cardiovascular: regular, s1, s2  Respiratory: normal resp  effort, no wheezing  Abdomen: soft, nondistended, pos BS  Musculoskeletal: perfused, no cyanosis, no clubbing  Data Reviewed: Basic Metabolic Panel:  Recent Labs Lab 11/17/15 1143 11/18/15 0036 11/20/15 0630  NA 140  --  141  K 4.0  --  3.8  CL 108  --  106  CO2 21*  --  26  GLUCOSE 161*  --  103*  BUN 18  --  11  CREATININE 1.37* 1.03 0.99  CALCIUM 9.0  --  8.7*   Liver Function Tests:  Recent Labs Lab 11/17/15 1143  AST 32  ALT 19  ALKPHOS 77  BILITOT 0.5  PROT 7.2  ALBUMIN 3.8   No results for input(s): LIPASE, AMYLASE in the last 168 hours. No results for input(s): AMMONIA in the last 168 hours. CBC:  Recent Labs Lab 11/17/15 1411 11/18/15 0036 11/20/15 0630  WBC 3.4* 6.6 3.9*  NEUTROABS 2.1  --   --   HGB 13.4 13.9 12.9*  HCT 39.8 41.3 38.0*  MCV 89.2 90.4 88.6  PLT 116* 110* 117*   Cardiac Enzymes: No results for input(s): CKTOTAL, CKMB, CKMBINDEX, TROPONINI in the last 168 hours. BNP (last 3 results) No results for input(s): BNP in the last 8760 hours.  ProBNP (last 3 results) No results for input(s): PROBNP in the last 8760 hours.  CBG: No results for input(s): GLUCAP in the last 168 hours.  Recent Results (from the past 240 hour(s))  Blood Culture (routine x 2)     Status: None (Preliminary result)   Collection Time: 11/17/15 12:25 PM  Result Value Ref Range Status   Specimen Description BLOOD RIGHT ANTECUBITAL  Final   Special Requests BOTTLES DRAWN AEROBIC AND ANAEROBIC 5CC  Final   Culture NO GROWTH 3 DAYS  Final   Report Status PENDING  Incomplete  Blood Culture (routine x 2)     Status: None (Preliminary result)   Collection Time: 11/17/15 12:35 PM  Result Value Ref Range Status   Specimen Description BLOOD RIGHT HAND  Final   Special Requests BOTTLES DRAWN AEROBIC AND ANAEROBIC 5CC  Final   Culture NO GROWTH 3 DAYS  Final   Report Status PENDING  Incomplete  Urine culture     Status: None   Collection Time: 11/17/15  4:56 PM   Result Value Ref Range Status   Specimen Description URINE, CLEAN CATCH  Final   Special Requests NONE  Final   Culture 6,000 COLONIES/mL INSIGNIFICANT GROWTH  Final   Report Status 11/19/2015 FINAL  Final  Gram stain     Status: None   Collection Time: 11/19/15  2:53 PM  Result Value Ref Range Status   Specimen Description BRONCHIAL ALVEOLAR LAVAGE  Final   Special Requests NONE  Final   Gram Stain   Final    ABUNDANT WBC PRESENT, PREDOMINANTLY PMN NO ORGANISMS SEEN    Report Status 11/19/2015 FINAL  Final  Culture, bal-quantitative     Status: None (Preliminary result)   Collection Time: 11/19/15  2:53 PM  Result Value Ref Range Status   Specimen Description BRONCHIAL ALVEOLAR LAVAGE  Final   Special Requests NONE  Final   Gram Stain   Final    ABUNDANT WBC PRESENT,BOTH PMN AND MONONUCLEAR FEW SQUAMOUS EPITHELIAL CELLS PRESENT NO ORGANISMS SEEN Performed at [facility] Performed at [facility]    Culture   Final    Culture reincubated for better growth Performed at [facility]    Report Status PENDING  Incomplete  Fungus Culture with Smear     Status: None (Preliminary result)   Collection Time: 11/19/15  2:53 PM  Result Value Ref Range Status   Specimen Description BRONCHIAL ALVEOLAR LAVAGE  Final   Special Requests NONE  Final   Fungal Smear   Final    NO YEAST OR FUNGAL ELEMENTS SEEN Performed at [facility]    Culture   Final    CULTURE IN PROGRESS FOR FOUR WEEKS Performed at [facility]    Report Status PENDING  Incomplete  AFB culture with smear (NOT at [facility])     Status: None (Preliminary result)   Collection Time: 11/19/15  2:53 PM  Result Value Ref Range Status   Specimen Description BRONCHIAL ALVEOLAR LAVAGE  Final   Special Requests NONE  Final   Acid Fast Smear   Final    NO ACID FAST BACILLI SEEN Performed at [facility]    Culture   Final    CULTURE WILL BE EXAMINED FOR 6 WEEKS BEFORE ISSUING  A FINAL REPORT Performed at [facility]    Report Status PENDING  Incomplete     Studies: No results found.  Scheduled Meds: . antiseptic oral rinse  7 mL Mouth Rinse BID  . aspirin EC  81 mg Oral Daily  . atorvastatin  40 mg Oral Daily  . azithromycin  500 mg Oral Q24H  . cefTRIAXone (ROCEPHIN)  IV  1 g Intravenous Q24H  . enoxaparin (LOVENOX) injection  40 mg Subcutaneous Daily  . metoprolol tartrate  25 mg Oral BID  . multivitamin with minerals  1 tablet Oral Daily  . tamsulosin  0.4 mg Oral Daily   Continuous Infusions:    Principal Problem:   Pneumonia Active Problems:   ARF (acute renal failure) (HCC)   HTN (hypertension)   Dyslipidemia   BPH (benign prostatic hyperplasia)   PNA (pneumonia)   Pulmonary nodules/lesions, multiple    Travis Purk K  [facility] Pager [phone]. If 7PM-7AM, please contact night-coverage at [email or web address], password [facility] 11/21/2015, 5:12 PM  LOS: 4 days

## 2015-11-21 NOTE — Progress Notes (Signed)
PCCM Interval Note  Cytology and pathology are pending. We will follow results with you. Agree with current management as we wait.    Baltazar Apo, MD, PhD 11/21/2015, 8:34 AM [facility] [phone] or if no answer [phone]

## 2015-11-22 ENCOUNTER — Inpatient Hospital Stay (HOSPITAL_COMMUNITY): Payer: Medicare Other

## 2015-11-22 ENCOUNTER — Encounter (HOSPITAL_COMMUNITY): Payer: Self-pay | Admitting: Pulmonary Disease

## 2015-11-22 DIAGNOSIS — J189 Pneumonia, unspecified organism: Secondary | ICD-10-CM | POA: Insufficient documentation

## 2015-11-22 LAB — CULTURE, BLOOD (ROUTINE X 2)
CULTURE: NO GROWTH
CULTURE: NO GROWTH

## 2015-11-22 LAB — CULTURE, BAL-QUANTITATIVE: CULTURE: NORMAL

## 2015-11-22 LAB — CULTURE, BAL-QUANTITATIVE W GRAM STAIN

## 2015-11-22 NOTE — Progress Notes (Signed)
[facility] PROGRESS NOTE  Matthew Berger HYW:[id number] DOB: October 14, 1935 DOA: 11/17/2015 PCP: [name], MD  HPI/Brief narrative 80 y.o. male with a Past Medical History of hypertension, BPH who presents to the ED with over 2 mos hx of persistent pneumonia, failing outpatient treatment.   Assessment/Plan: . Persistent left-sided infiltrate with biopsy confirmed Adenocarcinoma: Ex-smoker-empirically continued on Rocephin and Zithromax for possible community-acquired Pneumonia. Creatinine was mildly elevated, improved with IVF hydration. CT chest with contrast on 2/17 with concerns of multiple pulm nodules and possible bone mets involving T10 and L2. Pulmonary now following and pt is now s/p VBAL with cytology confirming adenocarcinoma. Would notify Oncology  . Mild ARF (acute renal failure): Likely mild prerenal azotemia secondary to above. Improved with hydration. Blood pressure was soft on admission, held all antihypertensives.  Marland Kitchen HTN (hypertension): Held antihypertensives on admit-blood pressure was soft on initial presentation  . Dyslipidemia: Continue statin   . BPH (benign prostatic hyperplasia): Continue Flomax  Recent Afib with RVR - No documented hx of afib - Currently on ASA - CHADS-VASc of 3 (age over 53, HTN). Would benefit from full anticoagulation, however, as pt is process of malignancy workup, would keep on ASA alone for now. Consider eliquis closer to d/c  - 2d echo still pending - TSH normal  Code Status: Full Family Communication: Pt in room Disposition Plan: Possible d/c home in 24-48hrs   Consultants:  Pulmonary  Procedures:  VBAL 2/17  Antibiotics: Anti-infectives    Start     Dose/Rate Route Frequency Ordered Stop   11/21/15 2200  azithromycin (ZITHROMAX) tablet 500 mg    Comments:  Indication: CAP   500 mg Oral Every 24 hours 11/21/15 1150     11/18/15 1200  cefTRIAXone (ROCEPHIN) 1 g in dextrose 5 % 50 mL IVPB  Status:  Discontinued     1 g 100 mL/hr over 30 Minutes Intravenous Every 24 hours 11/17/15 1224 11/18/15 0020   11/18/15 1200  azithromycin (ZITHROMAX) 500 mg in dextrose 5 % 250 mL IVPB  Status:  Discontinued     500 mg 250 mL/hr over 60 Minutes Intravenous Every 24 hours 11/17/15 1224 11/18/15 0020   11/18/15 0020  cefTRIAXone (ROCEPHIN) 1 g in dextrose 5 % 50 mL IVPB     1 g 100 mL/hr over 30 Minutes Intravenous Every 24 hours 11/18/15 0020     11/18/15 0020  azithromycin (ZITHROMAX) 500 mg in dextrose 5 % 250 mL IVPB  Status:  Discontinued     500 mg 250 mL/hr over 60 Minutes Intravenous Every 24 hours 11/18/15 0020 11/21/15 1150   11/17/15 1200  cefTRIAXone (ROCEPHIN) 1 g in dextrose 5 % 50 mL IVPB     1 g 100 mL/hr over 30 Minutes Intravenous  Once 11/17/15 1158 11/18/15 0826   11/17/15 1200  azithromycin (ZITHROMAX) 500 mg in dextrose 5 % 250 mL IVPB     500 mg 250 mL/hr over 60 Minutes Intravenous  Once 11/17/15 1158 11/18/15 0826      HPI/Subjective: Patient reports feeling better but still short winded when off O2  Objective: Filed Vitals:   11/21/15 1700 11/21/15 2004 11/22/15 0448 11/22/15 0945  BP: 125/67 122/67 106/60 106/64  Pulse: 81 80 66 92  Temp: 98.5 F (36.9 C) 98.7 F (37.1 C) 98.5 F (36.9 C) 99.2 F (37.3 C)  TempSrc: Oral Oral Oral Oral  Resp: '18 19 20 18  '$ Height:      Weight:  110.4 kg (243 lb 6.2  oz)    SpO2: 99% 98% 98% 96%    Intake/Output Summary (Last 24 hours) at 11/22/15 1856 Last data filed at 11/22/15 0951  Gross per 24 hour  Intake      0 ml  Output   1225 ml  Net  -1225 ml   Filed Weights   11/20/15 0422 11/20/15 2006 11/21/15 2004  Weight: 110.224 kg (243 lb) 110.3 kg (243 lb 2.7 oz) 110.4 kg (243 lb 6.2 oz)    Exam:   General:  Awake, in nad, sitting in chair looking out window  Cardiovascular: regular, s1, s2  Respiratory: normal resp effort, no wheezing  Abdomen: soft, nondistended, pos BS  Musculoskeletal: perfused, no clubbing  Data  Reviewed: Basic Metabolic Panel:  Recent Labs Lab 11/17/15 1143 11/18/15 0036 11/20/15 0630  NA 140  --  141  K 4.0  --  3.8  CL 108  --  106  CO2 21*  --  26  GLUCOSE 161*  --  103*  BUN 18  --  11  CREATININE 1.37* 1.03 0.99  CALCIUM 9.0  --  8.7*   Liver Function Tests:  Recent Labs Lab 11/17/15 1143  AST 32  ALT 19  ALKPHOS 77  BILITOT 0.5  PROT 7.2  ALBUMIN 3.8   No results for input(s): LIPASE, AMYLASE in the last 168 hours. No results for input(s): AMMONIA in the last 168 hours. CBC:  Recent Labs Lab 11/17/15 1411 11/18/15 0036 11/20/15 0630  WBC 3.4* 6.6 3.9*  NEUTROABS 2.1  --   --   HGB 13.4 13.9 12.9*  HCT 39.8 41.3 38.0*  MCV 89.2 90.4 88.6  PLT 116* 110* 117*   Cardiac Enzymes: No results for input(s): CKTOTAL, CKMB, CKMBINDEX, TROPONINI in the last 168 hours. BNP (last 3 results) No results for input(s): BNP in the last 8760 hours.  ProBNP (last 3 results) No results for input(s): PROBNP in the last 8760 hours.  CBG: No results for input(s): GLUCAP in the last 168 hours.  Recent Results (from the past 240 hour(s))  Blood Culture (routine x 2)     Status: None   Collection Time: 11/17/15 12:25 PM  Result Value Ref Range Status   Specimen Description BLOOD RIGHT ANTECUBITAL  Final   Special Requests BOTTLES DRAWN AEROBIC AND ANAEROBIC 5CC  Final   Culture NO GROWTH 5 DAYS  Final   Report Status 11/22/2015 FINAL  Final  Blood Culture (routine x 2)     Status: None   Collection Time: 11/17/15 12:35 PM  Result Value Ref Range Status   Specimen Description BLOOD RIGHT HAND  Final   Special Requests BOTTLES DRAWN AEROBIC AND ANAEROBIC 5CC  Final   Culture NO GROWTH 5 DAYS  Final   Report Status 11/22/2015 FINAL  Final  Urine culture     Status: None   Collection Time: 11/17/15  4:56 PM  Result Value Ref Range Status   Specimen Description URINE, CLEAN CATCH  Final   Special Requests NONE  Final   Culture 6,000 COLONIES/mL  INSIGNIFICANT GROWTH  Final   Report Status 11/19/2015 FINAL  Final  Gram stain     Status: None   Collection Time: 11/19/15  2:53 PM  Result Value Ref Range Status   Specimen Description BRONCHIAL ALVEOLAR LAVAGE  Final   Special Requests NONE  Final   Gram Stain   Final    ABUNDANT WBC PRESENT, PREDOMINANTLY PMN NO ORGANISMS SEEN    Report Status  11/19/2015 FINAL  Final  Culture, bal-quantitative     Status: None   Collection Time: 11/19/15  2:53 PM  Result Value Ref Range Status   Specimen Description BRONCHIAL ALVEOLAR LAVAGE  Final   Special Requests NONE  Final   Gram Stain   Final    ABUNDANT WBC PRESENT,BOTH PMN AND MONONUCLEAR FEW SQUAMOUS EPITHELIAL CELLS PRESENT NO ORGANISMS SEEN Performed at [facility] Performed at [place]   Final    50,000 COLONIES/ML Performed at [facility]    Culture   Final    NORMAL OROPHARYNGEAL FLORA Performed at [facility]    Report Status 11/22/2015 FINAL  Final  Fungus Culture with Smear     Status: None (Preliminary result)   Collection Time: 11/19/15  2:53 PM  Result Value Ref Range Status   Specimen Description BRONCHIAL ALVEOLAR LAVAGE  Final   Special Requests NONE  Final   Fungal Smear   Final    NO YEAST OR FUNGAL ELEMENTS SEEN Performed at [facility]    Culture   Final    CULTURE IN PROGRESS FOR FOUR WEEKS Performed at [facility]    Report Status PENDING  Incomplete  AFB culture with smear (NOT at [facility])     Status: None (Preliminary result)   Collection Time: 11/19/15  2:53 PM  Result Value Ref Range Status   Specimen Description BRONCHIAL ALVEOLAR LAVAGE  Final   Special Requests NONE  Final   Acid Fast Smear   Final    NO ACID FAST BACILLI SEEN Performed at [facility]    Culture   Final    CULTURE WILL BE EXAMINED FOR 6 WEEKS BEFORE ISSUING A FINAL REPORT Performed at [facility]    Report Status PENDING   Incomplete     Studies: No results found.  Scheduled Meds: . antiseptic oral rinse  7 mL Mouth Rinse BID  . aspirin EC  81 mg Oral Daily  . atorvastatin  40 mg Oral Daily  . azithromycin  500 mg Oral Q24H  . cefTRIAXone (ROCEPHIN)  IV  1 g Intravenous Q24H  . enoxaparin (LOVENOX) injection  40 mg Subcutaneous Daily  . metoprolol tartrate  25 mg Oral BID  . multivitamin with minerals  1 tablet Oral Daily  . tamsulosin  0.4 mg Oral Daily   Continuous Infusions:    Principal Problem:   Pneumonia Active Problems:   ARF (acute renal failure) (HCC)   HTN (hypertension)   Dyslipidemia   BPH (benign prostatic hyperplasia)   PNA (pneumonia)   Pulmonary nodules/lesions, multiple    CHIU, STEPHEN K  [facility] Pager [phone]. If 7PM-7AM, please contact night-coverage at [email or web address], password [facility] 11/22/2015, 6:56 PM  LOS: 5 days

## 2015-11-22 NOTE — Progress Notes (Signed)
Name: Matthew Berger MRN: [id number] DOB: Dec 11, 1935    ADMISSION DATE:  11/17/2015 CONSULTATION DATE:  2/17  REFERRING MD :  Wyline Copas   CHIEF COMPLAINT:  Abnormal CT chest   BRIEF PATIENT DESCRIPTION:  This is a 80 year old male who was diagnosed and treated for a Pneumonia back in Dec 2016. Since that time he has had several course of abx w/out resolution of symptoms. Had CT back in Jan 17 showed the LLL airspace disease and scattered pulmonary nodules. Admitted for failure to respond to out-pt rx on 2/15. A CT of chest was obtained which showed no sig change in the LLL airspace disease but worsening diffuse pulmonary nodules that had increased in Number and size. Also there was newly identified lytic lesions of T10 and L2. PCCM was asked to eval.   SIGNIFICANT EVENTS    STUDIES:  CT chest 2/17: There are multiple pulmonary nodular lesions which have increased in number compared to 6 weeks prior. Several nodular lesions that were present previously are slightly increased in size. There is a new enlarged lymph node in the right hilum. There is also a prominent lymph node in the sub- carinal region which may be marginally more prominent than on prior study. 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 There is continued airspace consolidation throughout much of the left lower lobe, stable.   HISTORY OF PRESENT ILLNESS:   This is a 80 year old male who was admitted on 2/15 w/ cc: cough. Per pt had been dx'd w/ PNA back in Dec 2016 via CXR and was treated w/ ABX. On f/u he continued to have cough was told he still had PNA and his antibiotic course was continued another week. His symptoms persisted in spite of therapy. He had a CT of chest on 1/4 this showed dense consolidation of the LLL and numerous pulmonary nodules t/o both lungs. He continued to have weakness and non-productive  cough. He was admitted on 2/15 w/ these symptoms above as well as worsening weakness & decreased appetite. He was placed on IV abx, provided IV hydration for mild AKI and an repeat CT chest was obtained on 2/17. This showed no change in the LLL, as well as increase in the number of nodules as well as size t/o both lungs. PCCM was asked to see in consult given concern for malignant process.   SUBJECTIVE:  No problems reported post FOB He has seen some blood in mucous Having a lot of nasal / sinus congestion  VITAL SIGNS: Temp:  [98.5 F (36.9 C)-99.2 F (37.3 C)] 99.2 F (37.3 C) (02/20 0945) Pulse Rate:  [66-92] 92 (02/20 0945) Resp:  [18-20] 18 (02/20 0945) BP: (106-125)/(60-67) 106/64 mmHg (02/20 0945) SpO2:  [96 %-99 %] 96 % (02/20 0945) Weight:  [110.4 kg (243 lb 6.2 oz)] 110.4 kg (243 lb 6.2 oz) (02/19 2004)  PHYSICAL EXAMINATION: General:  80 year old male, resting comfortably in bed. No acute distress Neuro:  Awake, oriented x3, no focal def  HEENT:  MMM, no nasal flaring, no JVD  Cardiovascular:  Rrr, no MRG Lungs:  Clear, no accessory muscle use  Abdomen:  Soft, not tender, no OM, + bowel sounds Musculoskeletal:  Equal st and bulk  Skin:  Warm and dry    Recent Labs Lab 11/17/15 1143 11/18/15 0036 11/20/15 0630  NA 140  --  141  K 4.0  --  3.8  CL 108  --  106  CO2 21*  --  26  BUN 18  --  11  CREATININE 1.37* 1.03 0.99  GLUCOSE 161*  --  103*    Recent Labs Lab 11/17/15 1411 11/18/15 0036 11/20/15 0630  HGB 13.4 13.9 12.9*  HCT 39.8 41.3 38.0*  WBC 3.4* 6.6 3.9*  PLT 116* 110* 117*   No results found.  ASSESSMENT / PLAN: Persistent LLL airspace disease Possible PNA  Multiple bilateral pulmonary nodules of increasing size and number Lytic lesions involving the T10 and L2 area.   Discussion.  Overall picture very concerning for malignancy, suspect primary lung CA Plan Await FOB bx results. Depending on results will decide on next diagnostic  steps, PET vs an alternative bx site.  Cont current abx  Baltazar Apo, MD, PhD 11/22/2015, 11:59 AM [facility] [phone] or if no answer [phone]

## 2015-11-22 NOTE — Care Management Important Message (Signed)
Important Message  Patient Details  Name: Matthew Berger MRN: [id number] Date of Birth: March 19, 1936   Medicare Important Message Given:  Yes    Loann Quill 11/22/2015, 11:37 AM

## 2015-11-23 ENCOUNTER — Inpatient Hospital Stay (HOSPITAL_COMMUNITY): Payer: Medicare Other

## 2015-11-23 DIAGNOSIS — I4891 Unspecified atrial fibrillation: Secondary | ICD-10-CM

## 2015-11-23 DIAGNOSIS — R0902 Hypoxemia: Secondary | ICD-10-CM

## 2015-11-23 MED ORDER — APIXABAN 5 MG PO TABS
5.0000 mg | ORAL_TABLET | Freq: Two times a day (BID) | ORAL | Status: DC
  Administered 2015-11-23 – 2015-11-24 (×2): 5 mg via ORAL
  Filled 2015-11-23 (×2): qty 1

## 2015-11-23 NOTE — Progress Notes (Signed)
Patient's room air saturation is 93%.

## 2015-11-23 NOTE — Progress Notes (Signed)
ANTICOAGULATION CONSULT NOTE - Initial Consult  Pharmacy Consult for Apixaban Indication: atrial fibrillation  No Known Allergies  Patient Measurements: Height: '6\' 1"'$  (185.4 cm) Weight: 223 lb 5.2 oz (101.3 kg) IBW/kg (Calculated) : 79.9 Heparin Dosing Weight:   Vital Signs: Temp: 99.8 F (37.7 C) (02/21 2112) Temp Source: Oral (02/21 2112) BP: 104/52 mmHg (02/21 2112) Pulse Rate: 83 (02/21 2112)  Labs: No results for input(s): HGB, HCT, PLT, APTT, LABPROT, INR, HEPARINUNFRC, HEPRLOWMOCWT, CREATININE, CKTOTAL, CKMB, TROPONINI in the last 72 hours.  Estimated Creatinine Clearance: 78.2 mL/min (by C-G formula based on Cr of 0.99).   Medical History: Past Medical History  Diagnosis Date  . Hypertension   . Hypercholesteremia   . Atrial fibrillation ([place])   . Pneumonia 11/18/2015  . GERD (gastroesophageal reflux disease)     Medications:  Scheduled:  . antiseptic oral rinse  7 mL Mouth Rinse BID  . atorvastatin  40 mg Oral Daily  . azithromycin  500 mg Oral Q24H  . cefTRIAXone (ROCEPHIN)  IV  1 g Intravenous Q24H  . enoxaparin (LOVENOX) injection  40 mg Subcutaneous Daily  . metoprolol tartrate  25 mg Oral BID  . multivitamin with minerals  1 tablet Oral Daily  . tamsulosin  0.4 mg Oral Daily    Assessment: 80yo male to start Apixaban for AFib.  No dosage adjustment for renal fxn necessary.    Goal of Therapy:  prevention of stroke 2nd AFib Monitor platelets by anticoagulation protocol: Yes   Plan:  Apixaban '5mg'$  po bid Watch for s/s of bleeding  Gracy Bruins, PharmD Clinical Pharmacist [facility]

## 2015-11-23 NOTE — Progress Notes (Signed)
[facility] PROGRESS NOTE  BOEN STERBENZ YFV:[id number] DOB: [birth year] DOA: 11/17/2015 PCP: [name], MD  HPI/Brief narrative 80 y.o. male with a Past Medical History of hypertension, BPH who presents to the ED with over 2 mos hx of persistent pneumonia, failing outpatient treatment.   Assessment/Plan: . Persistent left-sided infiltrate with biopsy confirmed Adenocarcinoma: Ex-smoker-empirically continued on Rocephin and Zithromax for possible community-acquired Pneumonia. Creatinine was mildly elevated, improved with IVF hydration. CT chest with contrast on 2/17 with concerns of multiple pulm nodules and possible bone mets involving T10 and L2. Pulmonary now following and pt is now s/p VBAL with cytology confirming adenocarcinoma. I have discussed case with Oncology, Dr. [name], who will arrange outpatient follow up. Per Pulmonary, would complete one more day of antibiotics (day 6/7 today). Patient now on minimal O2 support.  . Mild ARF (acute renal failure): Likely mild prerenal azotemia secondary to above. Improved with hydration. Blood pressure was soft on admission, held all antihypertensives.  Marland Kitchen HTN (hypertension): Held antihypertensives on admit-blood pressure was soft on initial presentation  . Dyslipidemia: Continue statin   . BPH (benign prostatic hyperplasia): Continue Flomax  Recent Afib with RVR - No documented hx of afib - Currently on ASA - CHADS-VASc of 3 (age over 68, HTN). Patient has been kept on aspirin given recent bronch. Will initiate eliquis for stroke prevention - 2d echo unremarkable with normal LVEF - TSH normal  Code Status: Full Family Communication: Pt in room Disposition Plan: Possible d/c home in 24hrs   Consultants:  Pulmonary  Procedures:  VBAL 2/17  Antibiotics: Anti-infectives    Start     Dose/Rate Route Frequency Ordered Stop   11/21/15 2200  azithromycin (ZITHROMAX) tablet 500 mg    Comments:  Indication: CAP   500 mg  Oral Every 24 hours 11/21/15 1150     11/18/15 1200  cefTRIAXone (ROCEPHIN) 1 g in dextrose 5 % 50 mL IVPB  Status:  Discontinued     1 g 100 mL/hr over 30 Minutes Intravenous Every 24 hours 11/17/15 1224 11/18/15 0020   11/18/15 1200  azithromycin (ZITHROMAX) 500 mg in dextrose 5 % 250 mL IVPB  Status:  Discontinued     500 mg 250 mL/hr over 60 Minutes Intravenous Every 24 hours 11/17/15 1224 11/18/15 0020   11/18/15 0020  cefTRIAXone (ROCEPHIN) 1 g in dextrose 5 % 50 mL IVPB     1 g 100 mL/hr over 30 Minutes Intravenous Every 24 hours 11/18/15 0020     11/18/15 0020  azithromycin (ZITHROMAX) 500 mg in dextrose 5 % 250 mL IVPB  Status:  Discontinued     500 mg 250 mL/hr over 60 Minutes Intravenous Every 24 hours 11/18/15 0020 11/21/15 1150   11/17/15 1200  cefTRIAXone (ROCEPHIN) 1 g in dextrose 5 % 50 mL IVPB     1 g 100 mL/hr over 30 Minutes Intravenous  Once 11/17/15 1158 11/18/15 0826   11/17/15 1200  azithromycin (ZITHROMAX) 500 mg in dextrose 5 % 250 mL IVPB     500 mg 250 mL/hr over 60 Minutes Intravenous  Once 11/17/15 1158 11/18/15 0826      HPI/Subjective: Feels better today. Denies chest pain  Objective: Filed Vitals:   11/22/15 1800 11/22/15 2052 11/23/15 1300 11/23/15 1700  BP: 117/65 118/53 134/78 100/62  Pulse: 87 79 59 77  Temp: 97.8 F (36.6 C) 99 F (37.2 C) 97.6 F (36.4 C) 99 F (37.2 C)  TempSrc: Oral Oral Oral Oral  Resp: 18 18  18 20  Height:      Weight:  108.3 kg (238 lb 12.1 oz)    SpO2: 94% 96% 100% 92%    Intake/Output Summary (Last 24 hours) at 11/23/15 1838 Last data filed at 11/23/15 1300  Gross per 24 hour  Intake    480 ml  Output   1500 ml  Net  -1020 ml   Filed Weights   11/20/15 2006 11/21/15 2004 11/22/15 2052  Weight: 110.3 kg (243 lb 2.7 oz) 110.4 kg (243 lb 6.2 oz) 108.3 kg (238 lb 12.1 oz)    Exam:   General:  Awake, in nad, sitting in chair looking out window  Cardiovascular: regular, s1, s2  Respiratory: normal  resp effort, no wheezing  Abdomen: soft, nondistended, pos BS  Musculoskeletal: perfused, no clubbing, no cyanosis  Data Reviewed: Basic Metabolic Panel:  Recent Labs Lab 11/17/15 1143 11/18/15 0036 11/20/15 0630  NA 140  --  141  K 4.0  --  3.8  CL 108  --  106  CO2 21*  --  26  GLUCOSE 161*  --  103*  BUN 18  --  11  CREATININE 1.37* 1.03 0.99  CALCIUM 9.0  --  8.7*   Liver Function Tests:  Recent Labs Lab 11/17/15 1143  AST 32  ALT 19  ALKPHOS 77  BILITOT 0.5  PROT 7.2  ALBUMIN 3.8   No results for input(s): LIPASE, AMYLASE in the last 168 hours. No results for input(s): AMMONIA in the last 168 hours. CBC:  Recent Labs Lab 11/17/15 1411 11/18/15 0036 11/20/15 0630  WBC 3.4* 6.6 3.9*  NEUTROABS 2.1  --   --   HGB 13.4 13.9 12.9*  HCT 39.8 41.3 38.0*  MCV 89.2 90.4 88.6  PLT 116* 110* 117*   Cardiac Enzymes: No results for input(s): CKTOTAL, CKMB, CKMBINDEX, TROPONINI in the last 168 hours. BNP (last 3 results) No results for input(s): BNP in the last 8760 hours.  ProBNP (last 3 results) No results for input(s): PROBNP in the last 8760 hours.  CBG: No results for input(s): GLUCAP in the last 168 hours.  Recent Results (from the past 240 hour(s))  Blood Culture (routine x 2)     Status: None   Collection Time: 11/17/15 12:25 PM  Result Value Ref Range Status   Specimen Description BLOOD RIGHT ANTECUBITAL  Final   Special Requests BOTTLES DRAWN AEROBIC AND ANAEROBIC 5CC  Final   Culture NO GROWTH 5 DAYS  Final   Report Status 11/22/2015 FINAL  Final  Blood Culture (routine x 2)     Status: None   Collection Time: 11/17/15 12:35 PM  Result Value Ref Range Status   Specimen Description BLOOD RIGHT HAND  Final   Special Requests BOTTLES DRAWN AEROBIC AND ANAEROBIC 5CC  Final   Culture NO GROWTH 5 DAYS  Final   Report Status 11/22/2015 FINAL  Final  Urine culture     Status: None   Collection Time: 11/17/15  4:56 PM  Result Value Ref Range  Status   Specimen Description URINE, CLEAN CATCH  Final   Special Requests NONE  Final   Culture 6,000 COLONIES/mL INSIGNIFICANT GROWTH  Final   Report Status 11/19/2015 FINAL  Final  Gram stain     Status: None   Collection Time: 11/19/15  2:53 PM  Result Value Ref Range Status   Specimen Description BRONCHIAL ALVEOLAR LAVAGE  Final   Special Requests NONE  Final   Gram Stain  Final    ABUNDANT WBC PRESENT, PREDOMINANTLY PMN NO ORGANISMS SEEN    Report Status 11/19/2015 FINAL  Final  Culture, bal-quantitative     Status: None   Collection Time: 11/19/15  2:53 PM  Result Value Ref Range Status   Specimen Description BRONCHIAL ALVEOLAR LAVAGE  Final   Special Requests NONE  Final   Gram Stain   Final    ABUNDANT WBC PRESENT,BOTH PMN AND MONONUCLEAR FEW SQUAMOUS EPITHELIAL CELLS PRESENT NO ORGANISMS SEEN Performed at [facility] Performed at [place]   Final    50,000 COLONIES/ML Performed at [facility]    Culture   Final    NORMAL OROPHARYNGEAL FLORA Performed at [facility]    Report Status 11/22/2015 FINAL  Final  Fungus Culture with Smear     Status: None (Preliminary result)   Collection Time: 11/19/15  2:53 PM  Result Value Ref Range Status   Specimen Description BRONCHIAL ALVEOLAR LAVAGE  Final   Special Requests NONE  Final   Fungal Smear   Final    NO YEAST OR FUNGAL ELEMENTS SEEN Performed at [facility]    Culture   Final    CULTURE IN PROGRESS FOR FOUR WEEKS Performed at [facility]    Report Status PENDING  Incomplete  AFB culture with smear (NOT at [facility])     Status: None (Preliminary result)   Collection Time: 11/19/15  2:53 PM  Result Value Ref Range Status   Specimen Description BRONCHIAL ALVEOLAR LAVAGE  Final   Special Requests NONE  Final   Acid Fast Smear   Final    NO ACID FAST BACILLI SEEN Performed at [facility]    Culture   Final    CULTURE WILL BE  EXAMINED FOR 6 WEEKS BEFORE ISSUING A FINAL REPORT Performed at [facility]    Report Status PENDING  Incomplete     Studies: No results found.  Scheduled Meds: . antiseptic oral rinse  7 mL Mouth Rinse BID  . aspirin EC  81 mg Oral Daily  . atorvastatin  40 mg Oral Daily  . azithromycin  500 mg Oral Q24H  . cefTRIAXone (ROCEPHIN)  IV  1 g Intravenous Q24H  . enoxaparin (LOVENOX) injection  40 mg Subcutaneous Daily  . metoprolol tartrate  25 mg Oral BID  . multivitamin with minerals  1 tablet Oral Daily  . tamsulosin  0.4 mg Oral Daily   Continuous Infusions:    Principal Problem:   Pneumonia Active Problems:   ARF (acute renal failure) (HCC)   HTN (hypertension)   Dyslipidemia   BPH (benign prostatic hyperplasia)   PNA (pneumonia)   Pulmonary nodules/lesions, multiple   Community acquired pneumonia    Inda Mcglothen, [facility] Pager [phone]. If 7PM-7AM, please contact night-coverage at [email or web address], password [facility] 11/23/2015, 6:38 PM  LOS: 6 days

## 2015-11-23 NOTE — Progress Notes (Signed)
   11/23/15 1557  Clinical Encounter Type  Visited With Patient and family together  Visit Type Spiritual support  Referral From Care management  Spiritual Encounters  Spiritual Needs Prayer;Emotional  Stress Factors  Patient Stress Factors Health changes;Lack of knowledge  Family Stress Factors Health changes;Lack of knowledge  Spent some time with patient, son, and wife. Patient just given unexpected cancer diagnosis and still adjusting to that news. Prayed with family and patient, who are all hoping that he will make it into Thursday's clinic at [facility] and not have to wait another week. Dezirea Mccollister, Chaplain

## 2015-11-23 NOTE — Treatment Plan (Signed)
Patient seen. Full note to follow. Patient's cytology pos for adenocarcinoma.  I have already notified Oncology who will arrange outpatient follow up. Hopefully will be able to d/c when off supplemental O2.

## 2015-11-23 NOTE — Progress Notes (Signed)
SATURATION QUALIFICATIONS: (This note is used to comply with regulatory documentation for home oxygen)  Patient Saturations on Room Air at Rest = 97%  Patient Saturations on Room Air while Ambulating = 90%  Patient Saturations on   N/A  Liters of oxygen while Ambulating = N/A  Please briefly explain why patient needs home oxygen:N/A

## 2015-11-23 NOTE — Progress Notes (Signed)
  Echocardiogram 2D Echocardiogram has been performed.  Matthew Berger 11/23/2015, 10:41 AM

## 2015-11-23 NOTE — Progress Notes (Signed)
Name: Matthew Berger MRN: [id number] DOB: [birth year]    ADMISSION DATE:  11/17/2015 CONSULTATION DATE:  2/17  REFERRING MD :  Wyline Copas   CHIEF COMPLAINT:  Abnormal CT chest   SUBJECTIVE: Pt reports he is anxious to get started with therapy / complete further staging  VITAL SIGNS: Temp:  [97.8 F (36.6 C)-99 F (37.2 C)] 99 F (37.2 C) (02/20 2052) Pulse Rate:  [79-87] 79 (02/20 2052) Resp:  [18] 18 (02/20 2052) BP: (117-118)/(53-65) 118/53 mmHg (02/20 2052) SpO2:  [94 %-96 %] 96 % (02/20 2052) Weight:  [238 lb 12.1 oz (108.3 kg)] 238 lb 12.1 oz (108.3 kg) (02/20 2052)  PHYSICAL EXAMINATION: General:  Elderly male, appears younger than age, resting comfortably in bed. No acute distress Neuro:  Awake, oriented x3, no focal def  HEENT:  MMM, no nasal flaring, no JVD  Cardiovascular:  RRR, no MRG Lungs:  Clear, no accessory muscle use  Abdomen:  Soft, not tender, no OM, + bowel sounds Musculoskeletal:  Equal strength and bulk  Skin:  Warm and dry    Recent Labs Lab 11/17/15 1143 11/18/15 0036 11/20/15 0630  NA 140  --  141  K 4.0  --  3.8  CL 108  --  106  CO2 21*  --  26  BUN 18  --  11  CREATININE 1.37* 1.03 0.99  GLUCOSE 161*  --  103*    Recent Labs Lab 11/17/15 1411 11/18/15 0036 11/20/15 0630  HGB 13.4 13.9 12.9*  HCT 39.8 41.3 38.0*  WBC 3.4* 6.6 3.9*  PLT 116* 110* 117*   No results found.  SIGNIFICANT EVENTS  2/15  Admit with dry cough, non-resolving PNA  STUDIES:  CT chest 2/17 >> multiple pulmonary nodular lesions which have increased in number compared to 6 weeks prior. Several nodular lesions that were present previously are slightly increased in size. New enlarged lymph node in the right hilum. Also a prominent lymph node in the sub- carinal region which may be marginally more prominent than on prior study. Evidence of bony metastatic disease involving the T10 vertebral body and posterior elements as well as the L2 vertebral body, incompletely  visualized. There is continued airspace consolidation throughout much of the left lower lobe, stable.    ASSESSMENT / PLAN: Adenocarcinoma of the Lung - s/p Bx 2/17 with tissue confirmation, concern for metastatic disease with T10, L2 vertebral involvement  Persistent LLL airspace disease Possible PNA  Multiple bilateral pulmonary nodules of increasing size and number Lytic lesions involving the T10 and L2 area.   Discussion.  80 y/o M admitted 2/15 with dry cough and non-resolving PNA despite outpatient therapy. He diagnosed and treated for a Pneumonia back in Dec 2016. Since that time he has had several course of abx w/out resolution of symptoms. Had CT back in Jan 17 showed the LLL airspace disease and scattered pulmonary nodules. Admitted for failure to respond to out-pt rx on 2/15. A CT of chest was obtained which showed no sig change in the LLL airspace disease but worsening diffuse pulmonary nodules that had increased in Number and size. Also there was newly identified lytic lesions of T10 and L2.  FOB on 2/17 with biopsy confirmed adenocarcinoma.    Plan Will need follow up PET scan (to be done as outpatient) Likely can be seen in [facility] clinic on 3/2.  Hopeful to schedule PET scan before 3/2 clinic ONC evaluation pending Complete 7 days abx, D6/7 Pulmonary hygiene - IS,  mobilize Intermittent CXR for new / worsening symtpoms Oxygen as needed to support saturations 90-95%  Noe Gens, NP-C [facility] Pgr: [phone] or if no answer [phone] 11/23/2015, 10:00 AM  Attending Note:  I have examined patient, reviewed labs, studies and notes. I have discussed the case with B Ollis, and I agree with the data and plans as amended above. Discussed the dx of AdenoCA with pt and his son this am. Recommended that he go to [facility], try to progress the staging eval in prep for that visit. Please order an outpt PET scan if possible.  I will let the Thoracic Oncology  Coordinator know about his diagnosis.   Baltazar Apo, MD, PhD 11/23/2015, 10:46 AM [facility] [phone] or if no answer [phone]

## 2015-11-24 ENCOUNTER — Encounter: Payer: Self-pay | Admitting: *Deleted

## 2015-11-24 LAB — BASIC METABOLIC PANEL
Anion gap: 6 (ref 5–15)
BUN: 11 mg/dL (ref 6–20)
CHLORIDE: 106 mmol/L (ref 101–111)
CO2: 28 mmol/L (ref 22–32)
CREATININE: 1.15 mg/dL (ref 0.61–1.24)
Calcium: 9.1 mg/dL (ref 8.9–10.3)
GFR calc non Af Amer: 60 mL/min — ABNORMAL LOW (ref >60)
Glucose, Bld: 113 mg/dL — ABNORMAL HIGH (ref 65–99)
Potassium: 4.1 mmol/L (ref 3.5–5.1)
Sodium: 140 mmol/L (ref 135–145)

## 2015-11-24 LAB — CBC
HEMATOCRIT: 38.2 % — AB (ref 39.0–52.0)
Hemoglobin: 13.1 g/dL (ref 13.0–17.0)
MCH: 30.1 pg (ref 26.0–34.0)
MCHC: 34.3 g/dL (ref 30.0–36.0)
MCV: 87.8 fL (ref 78.0–100.0)
Platelets: 170 10*3/uL (ref 150–400)
RBC: 4.35 MIL/uL (ref 4.22–5.81)
RDW: 12.7 % (ref 11.5–15.5)
WBC: 5.9 10*3/uL (ref 4.0–10.5)

## 2015-11-24 LAB — MAGNESIUM: Magnesium: 2.1 mg/dL (ref 1.7–2.4)

## 2015-11-24 MED ORDER — APIXABAN 5 MG PO TABS: 5.0000 mg | ORAL_TABLET | Freq: Two times a day (BID) | ORAL | Status: DC

## 2015-11-24 MED ORDER — DEXTROSE 5 % IV SOLN
1.0000 g | INTRAVENOUS | Status: AC
  Administered 2015-11-24: 1 g via INTRAVENOUS
  Filled 2015-11-24: qty 10

## 2015-11-24 NOTE — Discharge Summary (Signed)
Physician Discharge Summary  Matthew Berger IOE:[id number] DOB: [birth year] DOA: 11/17/2015  PCP: Matthew Panda, MD  Admit date: 11/17/2015 Discharge date: 11/24/2015  Time spent: 20 minutes  Recommendations for Outpatient Follow-up:  1. Follow up with PCP in 2-3 weeks 2. Follow up with Oncology, to be scheduled 3. Outpatient PET recommended for metastatic survey   Discharge Diagnoses:  Principal Problem:   Pneumonia Active Problems:   ARF (acute renal failure) (HCC)   HTN (hypertension)   Dyslipidemia   BPH (benign prostatic hyperplasia)   PNA (pneumonia)   Pulmonary nodules/lesions, multiple   Community acquired pneumonia   Hypoxia   Discharge Condition: Improved  Diet recommendation: Heart healthy  Filed Weights   11/21/15 2004 11/22/15 2052 11/23/15 2112  Weight: 110.4 kg (243 lb 6.2 oz) 108.3 kg (238 lb 12.1 oz) 101.3 kg (223 lb 5.2 oz)    History of present illness:  Please review dictated H and P from 2/15 for details. Briefly, 80 y.o. male with a Past Medical History of hypertension, BPH who presents to the ED with over 2 mos hx of persistent pneumonia, failing outpatient treatment.   Hospital Course:  . Acute hypoxic respiratory failure secondary to Persistent left-sided Pneumonia without sepsis also with biopsy confirmed Adenocarcinoma: Ex-smoker-empirically continued on Rocephin and Zithromax for possible community-acquired Pneumonia. Creatinine was mildly elevated, improved with IVF hydration. CT chest with contrast on 2/17 with concerns of multiple pulm nodules and possible bone mets involving T10 and L2. Pulmonary now following and pt is now s/p VBAL with cytology confirming adenocarcinoma. I had discussed case with Oncology, Dr. [name], who will arrange outpatient follow up. Also per Oncology, recommendations for PET as outpatient for metastatic survey. Patient is nonfocal on exam. Patient now on min O2 support. Pt has completed 7/7 days of azithromycin and  rocephin.  . Mild ARF (acute renal failure): Likely mild prerenal azotemia secondary to above. Improved with hydration. Blood pressure was soft on admission, held antihypertensives briefly on admit  . HTN (hypertension): Held antihypertensives on admit-blood pressure was soft on initial presentation. Resumed metoprolol with adequate BP control  . Dyslipidemia: Continue statin   . BPH (benign prostatic hyperplasia): Continue Flomax  Recent Afib with RVR - No documented hx of afib - Had been on ASA - CHADS-VASc of 3 (age over 38, HTN). Patient has been kept on aspirin given recent bronch. Initiated eliquis for stroke prevention - 2d echo unremarkable with normal LVEF - TSH normal  Consultations:  Oncology  Discharge Exam: Filed Vitals:   11/23/15 1700 11/23/15 2112 11/23/15 2300 11/24/15 0527  BP: 100/62 104/52 111/67 138/67  Pulse: 77 83  80  Temp: 99 F (37.2 C) 99.8 F (37.7 C)  98.8 F (37.1 C)  TempSrc: Oral Oral  Oral  Resp: '20 15  18  '$ Height:      Weight:  101.3 kg (223 lb 5.2 oz)    SpO2: 92% 92%  96%    General: awake, in nad Cardiovascular: regular, s1, s2 Respiratory: normal resp effort, no wheezing  Discharge Instructions     Medication List    STOP taking these medications        aspirin 81 MG tablet     diltiazem 180 MG 24 hr capsule  Commonly known as:  DILACOR XR      TAKE these medications        ALEVE 220 MG tablet  Generic drug:  naproxen sodium  Take 220 mg by mouth 2 (two) times daily with  a meal.     apixaban 5 MG Tabs tablet  Commonly known as:  ELIQUIS  Take 1 tablet (5 mg total) by mouth 2 (two) times daily.     atorvastatin 40 MG tablet  Commonly known as:  LIPITOR  Take 40 mg by mouth daily.     FLOMAX 0.4 MG Caps capsule  Generic drug:  tamsulosin  Take 0.4 mg by mouth daily.     metoprolol tartrate 25 MG tablet  Commonly known as:  LOPRESSOR  Take 25 mg by mouth 2 (two) times daily.     multivitamin with minerals  tablet  Take 1 tablet by mouth daily.       No Known Allergies Follow-up Information    Schedule an appointment as soon as possible for a visit with Matthew Panda, MD.   Specialty:  Internal Medicine   Why:  Hospital follow up   Contact information:   [address] [place] [address] [phone]       Follow up with [facility].   Why:  you will be notified to schedule an appointment       The results of significant diagnostics from this hospitalization (including imaging, microbiology, ancillary and laboratory) are listed below for reference.    Significant Diagnostic Studies: Dg Chest 2 View  11/17/2015  CLINICAL DATA:  Cough, 4 days duration. Recent history of pneumonia. EXAM: CHEST  2 VIEW COMPARISON:  11/12/2015.  09/29/2015. FINDINGS: Infiltrate in the left perihilar region and left lower lobe have worsened. Multiple nodular shadows demonstrated at CT are subtly visible on the chest radiograph. No effusions. No bony abnormalities. Atherosclerosis affects the aorta. IMPRESSION: Worsened infiltrate in the left perihilar region and left lower lobe. Persistent faint nodular opacities in both lungs. Though there is almost certainly a component of pneumonia, I am concerned about malignancy and metastatic disease in this case. I would suggest that bronchoscopy at least be considered in this case. Electronically Signed   By: Nelson Chimes M.D.   On: 11/17/2015 11:55   Dg Chest 2 View  11/12/2015  CLINICAL DATA:  Cough. EXAM: CHEST  2 VIEW COMPARISON:  CT 10/06/2015.  Chest x-ray 09/29/2015. FINDINGS: Mediastinum and hilar structures normal. Prominent pulmonary infiltrate in the superior segment left lower lobe. No interim clearing. Developing left lower lobe infiltrate. No pleural effusion or pneumothorax. Heart size stable. IMPRESSION: 1. Persistent infiltrate superior segment left lower lobe. No interim clearing. 2. Developing infiltrate left lower lobe. Electronically  Signed   By: Marcello Moores  Register   On: 11/12/2015 12:10   Ct Chest W Contrast  11/19/2015  CLINICAL DATA:  Lung metastases.  Atrial fibrillation EXAM: CT CHEST WITH CONTRAST TECHNIQUE: Multidetector CT imaging of the chest was performed during intravenous contrast administration. CONTRAST:  78m OMNIPAQUE IOHEXOL 300 MG/ML  SOLN COMPARISON:  Chest CT October 06, 2015; chest radiograph November 17, 2015 FINDINGS: Mediastinum/Lymph Nodes: Thyroid appears normal. There are several stable subcentimeter mediastinal lymph nodes. There is a focal lymph node in the right hilum which appears slightly larger than on recent prior study. This lymph node currently measures 2.0 x 1.8 cm. There is a mildly prominent sub- carinal lymph node measuring 1.7 x 1.5 cm. There is no thoracic aortic aneurysm or dissection. The visualized great vessels appear normal. There are scattered foci of atherosclerotic change in aorta. Pericardium is upper limits normal in thickness. Lungs/Pleura: There is extensive left lower lobe consolidation, also present previously. Multiple parenchymal lung nodular opacities are noted  throughout the lungs bilaterally. There is a nodular lesion in the anterior segment of the right upper lobe currently measuring 1.5 x 1.2 cm, compared to 1.3 x 1.0 cm on the prior study. Multiple other nodular lesions appear either stable or slightly increased in size compared to 6 weeks prior. There is relative confluence in the right upper lobe anteriorly compared to the previous study, felt to be indicative of progression of neoplastic change in this area. There are smaller scattered nodular lesions in the left upper lobe with the largest lesion in this area measuring 5 mm. In the right middle lobe, there are new small nodular opacities as well as a slightly irregular ground-glass type opacity measuring 1.6 x 1.0 cm. Multiple nodular lesions are noted in the left lower lobe with several nodular lesions in this area larger. The  largest individual nodular lesion in the left lower lobe currently measures 1.3 x 1.1 cm; on the recent prior study, this same nodular lesion measured 1.1 x 0.8 cm. Upper abdomen: In the visualized upper abdomen, there is hepatic steatosis. No adrenal lesions are identified. There is atherosclerotic change in the upper abdominal aorta. Musculoskeletal: There is a mixed sclerotic and lytic lesion involving the T10 vertebral body and pedicles bilaterally. There is also mixed sclerosis and lucency in the L2 vertebral body, incompletely visualized. IMPRESSION: There are multiple pulmonary nodular lesions which have increased in number compared to 6 weeks prior. Several nodular lesions that were present previously are slightly increased in size. There is a new enlarged lymph node in the right hilum. There is also a prominent lymph node in the sub- carinal region which may be marginally more prominent than on prior study. 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 There is continued airspace consolidation throughout much of the left lower lobe, stable. Electronically Signed   By: Lowella Grip III M.D.   On: 11/19/2015 07:06   Dg Chest Port 1 View  11/19/2015  CLINICAL DATA:  Post bronchoscopy EXAM: PORTABLE CHEST 1 VIEW COMPARISON:  2/15/ 17 FINDINGS: Cardiomediastinal silhouette is stable. Persistent infiltrate in left lower lobe retro hilar region. Vague nodules bilateral upper lobe are poorly visualized. There is no pneumothorax. No pulmonary edema or pleural effusion. IMPRESSION: Persistent infiltrate in left lower lobe retro hilar region. Vague nodules bilateral upper lobe are poorly visualized. There is no pneumothorax. Electronically Signed   By: Lahoma Crocker M.D.   On: 11/19/2015 15:28   Dg C-arm Bronchoscopy  11/19/2015  CLINICAL DATA:  C-ARM BRONCHOSCOPY Fluoroscopy was utilized by the  requesting physician.  No radiographic interpretation.    Microbiology: Recent Results (from the past 240 hour(s))  Blood Culture (routine x 2)     Status: None   Collection Time: 11/17/15 12:25 PM  Result Value Ref Range Status   Specimen Description BLOOD RIGHT ANTECUBITAL  Final   Special Requests BOTTLES DRAWN AEROBIC AND ANAEROBIC 5CC  Final   Culture NO GROWTH 5 DAYS  Final   Report Status 11/22/2015 FINAL  Final  Blood Culture (routine x 2)     Status: None   Collection Time: 11/17/15 12:35 PM  Result Value Ref Range Status   Specimen Description BLOOD RIGHT HAND  Final   Special Requests BOTTLES DRAWN AEROBIC AND ANAEROBIC 5CC  Final   Culture NO GROWTH 5 DAYS  Final   Report Status 11/22/2015 FINAL  Final  Urine culture  Status: None   Collection Time: 11/17/15  4:56 PM  Result Value Ref Range Status   Specimen Description URINE, CLEAN CATCH  Final   Special Requests NONE  Final   Culture 6,000 COLONIES/mL INSIGNIFICANT GROWTH  Final   Report Status 11/19/2015 FINAL  Final  Gram stain     Status: None   Collection Time: 11/19/15  2:53 PM  Result Value Ref Range Status   Specimen Description BRONCHIAL ALVEOLAR LAVAGE  Final   Special Requests NONE  Final   Gram Stain   Final    ABUNDANT WBC PRESENT, PREDOMINANTLY PMN NO ORGANISMS SEEN    Report Status 11/19/2015 FINAL  Final  Culture, bal-quantitative     Status: None   Collection Time: 11/19/15  2:53 PM  Result Value Ref Range Status   Specimen Description BRONCHIAL ALVEOLAR LAVAGE  Final   Special Requests NONE  Final   Gram Stain   Final    ABUNDANT WBC PRESENT,BOTH PMN AND MONONUCLEAR FEW SQUAMOUS EPITHELIAL CELLS PRESENT NO ORGANISMS SEEN Performed at [facility] Performed at [place]   Final    50,000 COLONIES/ML Performed at [facility]    Culture   Final    NORMAL OROPHARYNGEAL FLORA Performed at [facility]    Report Status 11/22/2015  FINAL  Final  Fungus Culture with Smear     Status: None (Preliminary result)   Collection Time: 11/19/15  2:53 PM  Result Value Ref Range Status   Specimen Description BRONCHIAL ALVEOLAR LAVAGE  Final   Special Requests NONE  Final   Fungal Smear   Final    NO YEAST OR FUNGAL ELEMENTS SEEN Performed at [facility]    Culture   Final    CULTURE IN PROGRESS FOR FOUR WEEKS Performed at [facility]    Report Status PENDING  Incomplete  AFB culture with smear (NOT at [facility])     Status: None (Preliminary result)   Collection Time: 11/19/15  2:53 PM  Result Value Ref Range Status   Specimen Description BRONCHIAL ALVEOLAR LAVAGE  Final   Special Requests NONE  Final   Acid Fast Smear   Final    NO ACID FAST BACILLI SEEN Performed at [facility]    Culture   Final    CULTURE WILL BE EXAMINED FOR 6 WEEKS BEFORE ISSUING A FINAL REPORT Performed at [facility]    Report Status PENDING  Incomplete     Labs: Basic Metabolic Panel:  Recent Labs Lab 11/17/15 1143 11/18/15 0036 11/20/15 0630 11/24/15 0453  NA 140  --  141 140  K 4.0  --  3.8 4.1  CL 108  --  106 106  CO2 21*  --  26 28  GLUCOSE 161*  --  103* 113*  BUN 18  --  11 11  CREATININE 1.37* 1.03 0.99 1.15  CALCIUM 9.0  --  8.7* 9.1  MG  --   --   --  2.1   Liver Function Tests:  Recent Labs Lab 11/17/15 1143  AST 32  ALT 19  ALKPHOS 77  BILITOT 0.5  PROT 7.2  ALBUMIN 3.8   No results for input(s): LIPASE, AMYLASE in the last 168 hours. No results for input(s): AMMONIA in the last 168 hours. CBC:  Recent Labs Lab 11/17/15 1411 11/18/15 0036 11/20/15 0630 11/24/15 0453  WBC 3.4* 6.6 3.9* 5.9  NEUTROABS 2.1  --   --   --  HGB 13.4 13.9 12.9* 13.1  HCT 39.8 41.3 38.0* 38.2*  MCV 89.2 90.4 88.6 87.8  PLT 116* 110* 117* 170   Cardiac Enzymes: No results for input(s): CKTOTAL, CKMB, CKMBINDEX, TROPONINI in the last 168 hours. BNP: BNP (last 3 results) No results  for input(s): BNP in the last 8760 hours.  ProBNP (last 3 results) No results for input(s): PROBNP in the last 8760 hours.  CBG: No results for input(s): GLUCAP in the last 168 hours.   Signed:  Geral Coker K  [facility] 11/24/2015, 8:01 AM

## 2015-11-24 NOTE — Discharge Instructions (Signed)

## 2015-11-24 NOTE — Progress Notes (Signed)
Oncology Nurse Navigator Documentation  Oncology Nurse Navigator Flowsheets 11/24/2015  Navigator Encounter Type Other/I received referral from Dr. Lamonte Sakai yesterday.  I noted today patient is still in the hospital.  I will arrange appt with Dr. [name] after discharge   Barriers/Navigation Needs Coordination of Care  Interventions Coordination of Care  Coordination of Care Appts  Acuity Level 1  Time Spent with Patient 15

## 2015-11-26 ENCOUNTER — Telehealth: Payer: Self-pay | Admitting: *Deleted

## 2015-11-26 DIAGNOSIS — R918 Other nonspecific abnormal finding of lung field: Secondary | ICD-10-CM

## 2015-11-26 NOTE — Telephone Encounter (Signed)
Oncology Nurse Navigator Documentation  Oncology Nurse Navigator Flowsheets 11/26/2015  Navigator Location [facility]  Navigator Encounter Type Telephone/I noticed patient was out of the hospital.  I called to schedule.  He will be seen at [facility] on 12/02/15 arrive at 12:30. I updated him on clinic.  He verbalized understanding of appt time and place.   Telephone Outgoing Call  Abnormal Finding Date 09/09/2015  Confirmed Diagnosis Date 11/19/2015  Treatment Phase Pre-Tx/Tx Discussion  Barriers/Navigation Needs Coordination of Care  Interventions Coordination of Care  Coordination of Care Appts  Acuity Level 1  Time Spent with Patient 15

## 2015-11-30 ENCOUNTER — Encounter: Payer: Self-pay | Admitting: *Deleted

## 2015-11-30 NOTE — Progress Notes (Signed)
Oncology Nurse Navigator Documentation  Oncology Nurse Navigator Flowsheets 11/30/2015  Navigator Location [facility]  Treatment Phase Pre-Tx/Tx Discussion  Barriers/Navigation Needs Coordination of Care/per Dr. [name], I contacted [facility] and requested Foundation One and PDL 1 Keytruda testing to be sent.  This was done on 11/25/15  Acuity Level 1  Time Spent with Patient 15

## 2015-12-01 ENCOUNTER — Telehealth: Payer: Self-pay | Admitting: *Deleted

## 2015-12-01 NOTE — Telephone Encounter (Signed)
Left a friendly reminder message for the pt on his voicemail about his clinic appt tomorrow 12/02/15.

## 2015-12-02 ENCOUNTER — Encounter: Payer: Self-pay | Admitting: *Deleted

## 2015-12-02 ENCOUNTER — Encounter: Payer: Self-pay | Admitting: Internal Medicine

## 2015-12-02 ENCOUNTER — Ambulatory Visit (INDEPENDENT_AMBULATORY_CARE_PROVIDER_SITE_OTHER): Payer: Medicare Other | Admitting: Pulmonary Disease

## 2015-12-02 ENCOUNTER — Ambulatory Visit: Payer: Medicare Other | Attending: Internal Medicine | Admitting: Physical Therapy

## 2015-12-02 ENCOUNTER — Encounter: Payer: Self-pay | Admitting: Pulmonary Disease

## 2015-12-02 ENCOUNTER — Other Ambulatory Visit (HOSPITAL_BASED_OUTPATIENT_CLINIC_OR_DEPARTMENT_OTHER): Payer: Medicare Other

## 2015-12-02 ENCOUNTER — Telehealth: Payer: Self-pay | Admitting: Internal Medicine

## 2015-12-02 ENCOUNTER — Ambulatory Visit (HOSPITAL_BASED_OUTPATIENT_CLINIC_OR_DEPARTMENT_OTHER): Payer: Medicare Other | Admitting: Internal Medicine

## 2015-12-02 VITALS — BP 127/63 | HR 76 | Temp 97.8°F | Resp 18 | Ht 73.0 in | Wt 241.9 lb

## 2015-12-02 DIAGNOSIS — M8589 Other specified disorders of bone density and structure, multiple sites: Secondary | ICD-10-CM | POA: Diagnosis not present

## 2015-12-02 DIAGNOSIS — R918 Other nonspecific abnormal finding of lung field: Secondary | ICD-10-CM

## 2015-12-02 DIAGNOSIS — C3490 Malignant neoplasm of unspecified part of unspecified bronchus or lung: Secondary | ICD-10-CM

## 2015-12-02 DIAGNOSIS — C3432 Malignant neoplasm of lower lobe, left bronchus or lung: Secondary | ICD-10-CM | POA: Diagnosis not present

## 2015-12-02 DIAGNOSIS — Z7409 Other reduced mobility: Secondary | ICD-10-CM | POA: Diagnosis present

## 2015-12-02 DIAGNOSIS — K219 Gastro-esophageal reflux disease without esophagitis: Secondary | ICD-10-CM | POA: Insufficient documentation

## 2015-12-02 DIAGNOSIS — E785 Hyperlipidemia, unspecified: Secondary | ICD-10-CM

## 2015-12-02 DIAGNOSIS — I4891 Unspecified atrial fibrillation: Secondary | ICD-10-CM | POA: Insufficient documentation

## 2015-12-02 DIAGNOSIS — I10 Essential (primary) hypertension: Secondary | ICD-10-CM

## 2015-12-02 DIAGNOSIS — R5381 Other malaise: Secondary | ICD-10-CM | POA: Diagnosis not present

## 2015-12-02 LAB — COMPREHENSIVE METABOLIC PANEL
ALT: 16 U/L (ref 0–55)
ANION GAP: 8 meq/L (ref 3–11)
AST: 14 U/L (ref 5–34)
Albumin: 3.5 g/dL (ref 3.5–5.0)
Alkaline Phosphatase: 79 U/L (ref 40–150)
BILIRUBIN TOTAL: 0.58 mg/dL (ref 0.20–1.20)
BUN: 14 mg/dL (ref 7.0–26.0)
CO2: 23 meq/L (ref 22–29)
CREATININE: 1.1 mg/dL (ref 0.7–1.3)
Calcium: 9.1 mg/dL (ref 8.4–10.4)
Chloride: 108 mEq/L (ref 98–109)
EGFR: 73 mL/min/{1.73_m2} — ABNORMAL LOW (ref >90)
Glucose: 92 mg/dl (ref 70–140)
Potassium: 4 mEq/L (ref 3.5–5.1)
Sodium: 139 mEq/L (ref 136–145)
TOTAL PROTEIN: 6.9 g/dL (ref 6.4–8.3)

## 2015-12-02 LAB — CBC WITH DIFFERENTIAL/PLATELET
BASO%: 0.3 % (ref 0.0–2.0)
Basophils Absolute: 0 10*3/uL (ref 0.0–0.1)
EOS%: 7.7 % — ABNORMAL HIGH (ref 0.0–7.0)
Eosinophils Absolute: 0.5 10*3/uL (ref 0.0–0.5)
HEMATOCRIT: 39.9 % (ref 38.4–49.9)
HEMOGLOBIN: 14 g/dL (ref 13.0–17.1)
LYMPH#: 1.4 10*3/uL (ref 0.9–3.3)
LYMPH%: 21.1 % (ref 14.0–49.0)
MCH: 30.7 pg (ref 27.2–33.4)
MCHC: 35.1 g/dL (ref 32.0–36.0)
MCV: 87.5 fL (ref 79.3–98.0)
MONO#: 0.8 10*3/uL (ref 0.1–0.9)
MONO%: 11.4 % (ref 0.0–14.0)
NEUT%: 59.5 % (ref 39.0–75.0)
NEUTROS ABS: 4 10*3/uL (ref 1.5–6.5)
PLATELETS: 182 10*3/uL (ref 140–400)
RBC: 4.56 10*6/uL (ref 4.20–5.82)
RDW: 13.1 % (ref 11.0–14.6)
WBC: 6.6 10*3/uL (ref 4.0–10.3)

## 2015-12-02 NOTE — Progress Notes (Signed)
[place] Clinical Social Work  Clinical Social Work met with patient/family and Futures trader at [facility] appointment to offer support and assess for psychosocial needs.  Medical oncologist reviewed patient's diagnosis and recommended plan for PET and MRI scan to determine treatment options with patient/family.  The patient was accompanied by his daughter, son, daughter-in-law, and niece.  Mr. Sciulli family identified patient as the "patriarch of the family" and shared he is a very strong, special person.  The patient has eight children (all live locally), 25 grandchildren, and many great grandchildren.  Patient/family shared they have a very large, close family.  Patient identified his niece as his main support at this time due to her working in the medical field.  Mr. Oyen expressed feeling in shock, but believed he would "put this in God's hands".    Patient only indicated cause of distress as loss of appetite.  CSW encouraged patient to utilize [facility] dietitian for nutrition counseling.  ONCBCN DISTRESS SCREENING 12/02/2015  Screening Type Initial Screening  Distress experienced in past week (1-10) 7  Physical Problem type Loss of appetitie  Referral to clinical social work Yes    Clinical Social Work briefly discussed [facility] role and [employer] support programs/services.  Clinical Social Work encouraged patient to call with any additional questions or concerns.   Polo Riley, MSW, LCSW, OSW-C Clinical Social Worker [facility] [phone]

## 2015-12-02 NOTE — Therapy (Signed)
[place], [place], [address] Phone: [phone]   Fax:  [phone]  Physical Therapy Evaluation  Patient Details  Name: Matthew Berger MRN: [id number] Date of Birth: 04-Jun-1936 Referring Provider: Dr. Curt Bears  Encounter Date: 12/02/2015      PT End of Session - 12/02/15 1439    Visit Number 1   Number of Visits 1   PT Start Time 1400   PT Stop Time 1425   PT Time Calculation (min) 25 min   Activity Tolerance Patient tolerated treatment well   Behavior During Therapy [facility] for tasks assessed/performed      Past Medical History  Diagnosis Date  . Hypertension   . Hypercholesteremia   . Atrial fibrillation ([place])   . Pneumonia 11/18/2015  . GERD (gastroesophageal reflux disease)   . Non-small cell lung cancer (NSCLC) ([place]) 11/19/15    Past Surgical History  Procedure Laterality Date  . Wisdom tooth extraction    . Rotator cuff repair Right   . Video bronchoscopy Bilateral 11/19/2015    Procedure: VIDEO BRONCHOSCOPY WITH FLUORO;  Surgeon: Rigoberto Noel, MD;  Location: [place];  Service: Cardiopulmonary;  Laterality: Bilateral;  . Cataract extraction Bilateral     There were no vitals filed for this visit.  Visit Diagnosis:  Physical deconditioning - Plan: PT plan of care cert/re-cert  Impaired functional mobility, balance, and endurance - Plan: PT plan of care cert/re-cert      Subjective Assessment - 12/02/15 1427    Subjective reports some shortness of breath   Patient is accompained by: Family member  4 family members   Pertinent History Patient presented with weakness and chronic cough to ED; he had been followed with scans for a left lung infiltrate and chronic cough.  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  Ex-smoker; HTN; atrial fibrillation, GERD, h/o right RTC repair.   Patient Stated Goals get info from all  lung clinic providers   Currently in Pain? No/denies            [facility] PT Assessment - 12/02/15 0001    Assessment   Medical Diagnosis stage IV lung adenocarcinoma   Referring Provider Dr. Curt Bears   Onset Date/Surgical Date 11/19/15   Precautions   Precautions Other (comment)   Precaution Comments cancer precautions   Restrictions   Weight Bearing Restrictions No   Balance Screen   Has the patient fallen in the past 6 months No   Has the patient had a decrease in activity level because of a fear of falling?  No   Is the patient reluctant to leave their home because of a fear of falling?  No   Home Social worker Private residence   Living Arrangements Spouse/significant other;Children  wife and daughter   Type of [place] One level   Prior Function   Level of Independence Independent   Leisure no regular exercise but is active:  does Development worker, community Status Within Functional Limits for tasks assessed   Observation/Other Assessments   Observations well-looking gentleman who looks younger than his age   Functional Tests   Functional tests Sit to Stand   Sit to Stand   Comments 9 times in 30 seconds, less than average  moderate dyspnea following this   Posture/Postural Control   Posture/Postural Control No significant limitations  Posture Comments just slight forward flexion at hip   ROM / Strength   AROM / PROM / Strength AROM   AROM   Overall AROM Comments trunk AROM in standing WFL with slight limitations in all directions except forward flexion   Ambulation/Gait   Ambulation/Gait Yes   Ambulation/Gait Assistance 6: Modified independent (Device/Increase time)   Assistive device None   Gait Comments reports shortness of breath with longer distances in walking   Balance   Balance Assessed Yes   Dynamic Standing Balance   Dynamic Standing - Comments reaches 12 inches forward in standing, a bit less  than average                           PT Education - 2015-12-27 1438    Education provided Yes   Education Details posture, breathing, energy conservation, walking, Cure article on staying active, PT info   Person(s) Educated Patient;Other (comment)  niece and 3 other family members   Methods Explanation;Demonstration;Handout   Comprehension Verbalized understanding               Lung Clinic Goals - 12-27-15 1443    Patient will be able to verbalize understanding of the benefit of exercise to decrease fatigue.   Status Achieved   Patient will be able to verbalize the importance of posture.   Status Achieved   Patient will be able to demonstrate diaphragmatic breathing for improved lung function.   Status Achieved   Patient will be able to verbalize understanding of the role of physical therapy to prevent functional decline and who to contact if physical therapy is needed.   Status Achieved             Plan - 12-27-2015 1440    Clinical Impression Statement Patient with new diagnosis of stage IV lung cancer who expects to undergo chemotherapy.  He is limited by shortness of breath for ambulation and with 30 second sit to/from stand test; has mild limitations in forward reach in standing.   Pt will benefit from skilled therapeutic intervention in order to improve on the following deficits Cardiopulmonary status limiting activity;Decreased activity tolerance;Decreased balance   Rehab Potential Fair   Clinical Impairments Affecting Rehab Potential cancer is stage IV   PT Frequency One time visit   PT Treatment/Interventions Patient/family education   PT Next Visit Plan None at this time; may benefit from therapy going forward for endurance.   PT Home Exercise Plan see education section   Consulted and Agree with Plan of Care Patient          G-Codes - December 27, 2015 1444    Functional Assessment Tool Used clinical judgement   Functional Limitation  Mobility: Walking and moving around   Mobility: Walking and Moving Around Current Status [phone]) At least 20 percent but less than 40 percent impaired, limited or restricted   Mobility: Walking and Moving Around Goal Status [phone]) At least 20 percent but less than 40 percent impaired, limited or restricted   Mobility: Walking and Moving Around Discharge Status [phone]) At least 20 percent but less than 40 percent impaired, limited or restricted       Problem List Patient Active Problem List   Diagnosis Date Noted  . GERD (gastroesophageal reflux disease) 2015-12-27  . Atrial fibrillation ([place]) 12-27-15  . Non-small cell lung cancer (NSCLC) ([place]) 11/19/2015  . Pulmonary nodules/lesions, multiple   . HTN (hypertension) 11/17/2015  . Dyslipidemia 11/17/2015  .  BPH (benign prostatic hyperplasia) 11/17/2015    SALISBURY,DONNA 12/02/2015, 2:46 PM  [place], [place], [address] Phone: [phone]   Fax:  [phone]  Name: Matthew Berger MRN: [id number] Date of Birth: 06/08/1936   Serafina Royals, PT 12/02/2015 2:47 PM

## 2015-12-02 NOTE — Telephone Encounter (Signed)
per po to sch pt appt-gave pt copy of avs-adv centrla schwill call top sch scan

## 2015-12-02 NOTE — Patient Instructions (Signed)
   Please call me if you have any new breathing problems before your next appointment.  I am referring you to a radiation cancer specialist for possible treatment who will coordinate with Dr. [name].  I will see you back in 2-3 months.

## 2015-12-02 NOTE — Progress Notes (Signed)
[place] [phone]   [phone] Multidisciplinary thoracic oncology clinic  CONSULT NOTE  REFERRING PHYSICIAN: Dr. Kara Mead  REASON FOR CONSULTATION:  80 years old African-American male recently diagnosed with lung cancer.  HPI Matthew Berger is a 80 y.o. male with past medical history significant for hypertension, dyslipidemia, atrial fibrillation, benign prostatic hypertrophy, GERD as well as remote history of heavy smoking. In December 2016 the patient was seen by his cardiologist, Dr. Einar Gip for routine evaluation and management of his atrial fibrillation. During the visit he was complaining of cough and shortness of breath. Chest x-ray at that time was questionable for pneumonia of the left lung. The patient was referred to his primary care physician, Dr. Abbott Pao and was started on treatment with a course of antibiotics but the repeat chest x-ray showed no improvement of his condition. CT scan of the chest with contrast was performed on 10/06/2015 and it showed dense consolidation noted in the superior segment of the left lower lobe as seen on prior chest x-ray. No visible endobronchial lesion or central mass lesion. In addition, numerous clustered nodules are noted elsewhere throughout the left lower lobe. These measure from very small up to 17 mm in greatest diameter. Small clustered nodules also noted in the posterior lingula within the left upper lobe. Several nodules also noted in the right upper lobe, the largest measuring 13 mm. he was referred to pulmonary for evaluation before his appointment he was admitted to [facility] with worsening dyspnea and cough. Repeat CT scan of the chest on 11/19/2015 showed multiple pulmonary nodular lesions increased in number compared to 6 weeks before. There was several nodular lesions that were present previously also slightly increased in size. There is new or enlarged lymph node in the right hilum.  There was also prominent lymph node in the subcarinal region more prominent than on the previous study. There was evidence of bony metastatic disease involving the T10 vertebral body and posterior elements as well as the L2 vertebral body.  The patient was seen by Dr. Elsworth Soho and on 11/19/2015 he underwent bronchoscopy with bronchial washing and brushing as well as biopsy of the left lower lobe lesion. The final pathology was consistent with adenocarcinoma. The tissue block was sent to [facility] one for molecular study. The patient was referred to me today for evaluation and discussion of his treatment options. When seen today he continues to complain of generalized weakness as well as shortness breath and cough productive of whitish sputum. He denied having any significant chest pain or hemoptysis. He has no nausea, vomiting, diarrhea or constipation. He denied having any significant headache or visual changes. He lost around 10 pounds over the last 2 months. Family history significant for mother with stroke, father died from pneumonia, father died from lung cancer at age 55 and sister died from breast cancer at age 48. The patient is married and has 8 children. He was accompanied today by his children as well as his knees and daughter-in-law. He is to work as a Administrator. He has a history of smoking up to 2 pack per day for around 35 years but quit 27 years ago. He also quit alcohol drinking 27 years ago and no history of drug abuse.  HPI  Past Medical History  Diagnosis Date  . Hypertension   . Hypercholesteremia   . Atrial fibrillation ([place])   . Pneumonia 11/18/2015  . GERD (gastroesophageal reflux disease)   . Non-small  cell lung cancer (NSCLC) ([place]) 11/19/15    Past Surgical History  Procedure Laterality Date  . Wisdom tooth extraction    . Rotator cuff repair Right   . Video bronchoscopy Bilateral 11/19/2015    Procedure: VIDEO BRONCHOSCOPY WITH FLUORO;  Surgeon: Rigoberto Noel, MD;   Location: [place];  Service: Cardiopulmonary;  Laterality: Bilateral;  . Cataract extraction Bilateral     Family History  Problem Relation Age of Onset  . Breast cancer Sister   . Stroke Sister   . Colon cancer Brother   . Cancer Maternal Aunt     x2  . Hypertension Mother   . Stroke Mother   . Breast cancer Cousin   . Diabetes Other   . Hypertension Other   . Hyperlipidemia Other     Social History Social History  Substance Use Topics  . Smoking status: Former Smoker -- 2.00 packs/day for 40 years    Types: Cigarettes    Quit date: 10/02/1990  . Smokeless tobacco: Never Used     Comment: smoked 1-2 ppd.   . Alcohol Use: No    No Known Allergies  Current Outpatient Prescriptions  Medication Sig Dispense Refill  . apixaban (ELIQUIS) 5 MG TABS tablet Take 1 tablet (5 mg total) by mouth 2 (two) times daily. 60 tablet 0  . aspirin 81 MG tablet Take 81 mg by mouth daily.    Marland Kitchen atorvastatin (LIPITOR) 20 MG tablet Take 20 mg by mouth daily.    . Brinzolamide-Brimonidine (SIMBRINZA) 1-0.2 % SUSP Apply 2 drops to eye 2 (two) times daily.    Marland Kitchen diltiazem (DILACOR XR) 180 MG 24 hr capsule Take 180 mg by mouth daily.    . dorzolamide (TRUSOPT) 2 % ophthalmic solution Place 1 drop into the left eye 2 (two) times daily.    Marland Kitchen latanoprost (XALATAN) 0.005 % ophthalmic solution Place 1 drop into both eyes at bedtime.    . metoprolol tartrate (LOPRESSOR) 25 MG tablet Take 25 mg by mouth 2 (two) times daily.    . Multiple Vitamins-Minerals (MULTIVITAMIN WITH MINERALS) tablet Take 1 tablet by mouth daily.    . naproxen sodium (ALEVE) 220 MG tablet Take 440 mg by mouth daily.     . tamsulosin (FLOMAX) 0.4 MG CAPS capsule Take 0.4 mg by mouth daily.     No current facility-administered medications for this visit.    Review of Systems  Constitutional: positive for fatigue and weight loss Eyes: negative Ears, nose, mouth, throat, and face: negative Respiratory: positive for cough and  dyspnea on exertion Cardiovascular: negative Gastrointestinal: negative Genitourinary:negative Integument/breast: negative Hematologic/lymphatic: negative Musculoskeletal:negative Neurological: negative Behavioral/Psych: negative Endocrine: negative Allergic/Immunologic: negative  Physical Exam  OHY:WVPXT, healthy, no distress, well nourished and well developed SKIN: skin color, texture, turgor are normal, no rashes or significant lesions HEAD: Normocephalic, No masses, lesions, tenderness or abnormalities EYES: normal, PERRLA, Conjunctiva are pink and non-injected EARS: External ears normal, Canals clear OROPHARYNX:no exudate, no erythema and lips, buccal mucosa, and tongue normal  NECK: supple, no adenopathy, no JVD LYMPH:  no palpable lymphadenopathy, no hepatosplenomegaly LUNGS: decreased breath sounds, expiratory wheezes bilaterally HEART: regular rate & rhythm, no murmurs and no gallops ABDOMEN:abdomen soft, non-tender, normal bowel sounds and no masses or organomegaly BACK: Back symmetric, no curvature., No CVA tenderness EXTREMITIES:no joint deformities, effusion, or inflammation, no edema, no skin discoloration  NEURO: alert & oriented x 3 with fluent speech, no focal motor/sensory deficits  PERFORMANCE STATUS: ECOG 1  LABORATORY DATA:  Lab Results  Component Value Date   WBC 6.6 12/02/2015   HGB 14.0 12/02/2015   HCT 39.9 12/02/2015   MCV 87.5 12/02/2015   PLT 182 12/02/2015      Chemistry      Component Value Date/Time   NA 139 12/02/2015 1201   NA 140 11/24/2015 0453   K 4.0 12/02/2015 1201   K 4.1 11/24/2015 0453   CL 106 11/24/2015 0453   CO2 23 12/02/2015 1201   CO2 28 11/24/2015 0453   BUN 14.0 12/02/2015 1201   BUN 11 11/24/2015 0453   CREATININE 1.1 12/02/2015 1201   CREATININE 1.15 11/24/2015 0453      Component Value Date/Time   CALCIUM 9.1 12/02/2015 1201   CALCIUM 9.1 11/24/2015 0453   ALKPHOS 79 12/02/2015 1201   ALKPHOS 77 11/17/2015  1143   AST 14 12/02/2015 1201   AST 32 11/17/2015 1143   ALT 16 12/02/2015 1201   ALT 19 11/17/2015 1143   BILITOT 0.58 12/02/2015 1201   BILITOT 0.5 11/17/2015 1143       RADIOGRAPHIC STUDIES: Dg Chest 2 View  11/17/2015  CLINICAL DATA:  Cough, 4 days duration. Recent history of pneumonia. EXAM: CHEST  2 VIEW COMPARISON:  11/12/2015.  09/29/2015. FINDINGS: Infiltrate in the left perihilar region and left lower lobe have worsened. Multiple nodular shadows demonstrated at CT are subtly visible on the chest radiograph. No effusions. No bony abnormalities. Atherosclerosis affects the aorta. IMPRESSION: Worsened infiltrate in the left perihilar region and left lower lobe. Persistent faint nodular opacities in both lungs. Though there is almost certainly a component of pneumonia, I am concerned about malignancy and metastatic disease in this case. I would suggest that bronchoscopy at least be considered in this case. Electronically Signed   By: Nelson Chimes M.D.   On: 11/17/2015 11:55   Dg Chest 2 View  11/12/2015  CLINICAL DATA:  Cough. EXAM: CHEST  2 VIEW COMPARISON:  CT 10/06/2015.  Chest x-ray 09/29/2015. FINDINGS: Mediastinum and hilar structures normal. Prominent pulmonary infiltrate in the superior segment left lower lobe. No interim clearing. Developing left lower lobe infiltrate. No pleural effusion or pneumothorax. Heart size stable. IMPRESSION: 1. Persistent infiltrate superior segment left lower lobe. No interim clearing. 2. Developing infiltrate left lower lobe. Electronically Signed   By: Marcello Moores  Register   On: 11/12/2015 12:10   Ct Chest W Contrast  11/19/2015  CLINICAL DATA:  Lung metastases.  Atrial fibrillation EXAM: CT CHEST WITH CONTRAST TECHNIQUE: Multidetector CT imaging of the chest was performed during intravenous contrast administration. CONTRAST:  53m OMNIPAQUE IOHEXOL 300 MG/ML  SOLN COMPARISON:  Chest CT October 06, 2015; chest radiograph November 17, 2015 FINDINGS:  Mediastinum/Lymph Nodes: Thyroid appears normal. There are several stable subcentimeter mediastinal lymph nodes. There is a focal lymph node in the right hilum which appears slightly larger than on recent prior study. This lymph node currently measures 2.0 x 1.8 cm. There is a mildly prominent sub- carinal lymph node measuring 1.7 x 1.5 cm. There is no thoracic aortic aneurysm or dissection. The visualized great vessels appear normal. There are scattered foci of atherosclerotic change in aorta. Pericardium is upper limits normal in thickness. Lungs/Pleura: There is extensive left lower lobe consolidation, also present previously. Multiple parenchymal lung nodular opacities are noted throughout the lungs bilaterally. There is a nodular lesion in the anterior segment of the right upper lobe currently measuring 1.5 x 1.2 cm, compared to 1.3 x 1.0 cm on the prior study.  Multiple other nodular lesions appear either stable or slightly increased in size compared to 6 weeks prior. There is relative confluence in the right upper lobe anteriorly compared to the previous study, felt to be indicative of progression of neoplastic change in this area. There are smaller scattered nodular lesions in the left upper lobe with the largest lesion in this area measuring 5 mm. In the right middle lobe, there are new small nodular opacities as well as a slightly irregular ground-glass type opacity measuring 1.6 x 1.0 cm. Multiple nodular lesions are noted in the left lower lobe with several nodular lesions in this area larger. The largest individual nodular lesion in the left lower lobe currently measures 1.3 x 1.1 cm; on the recent prior study, this same nodular lesion measured 1.1 x 0.8 cm. Upper abdomen: In the visualized upper abdomen, there is hepatic steatosis. No adrenal lesions are identified. There is atherosclerotic change in the upper abdominal aorta. Musculoskeletal: There is a mixed sclerotic and lytic lesion involving the  T10 vertebral body and pedicles bilaterally. There is also mixed sclerosis and lucency in the L2 vertebral body, incompletely visualized. IMPRESSION: There are multiple pulmonary nodular lesions which have increased in number compared to 6 weeks prior. Several nodular lesions that were present previously are slightly increased in size. There is a new enlarged lymph node in the right hilum. There is also a prominent lymph node in the sub- carinal region which may be marginally more prominent than on prior study. 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 There is continued airspace consolidation throughout much of the left lower lobe, stable. Electronically Signed   By: Lowella Grip III M.D.   On: 11/19/2015 07:06   Dg Chest Port 1 View  11/19/2015  CLINICAL DATA:  Post bronchoscopy EXAM: PORTABLE CHEST 1 VIEW COMPARISON:  2/15/ 17 FINDINGS: Cardiomediastinal silhouette is stable. Persistent infiltrate in left lower lobe retro hilar region. Vague nodules bilateral upper lobe are poorly visualized. There is no pneumothorax. No pulmonary edema or pleural effusion. IMPRESSION: Persistent infiltrate in left lower lobe retro hilar region. Vague nodules bilateral upper lobe are poorly visualized. There is no pneumothorax. Electronically Signed   By: Lahoma Crocker M.D.   On: 11/19/2015 15:28   Dg C-arm Bronchoscopy  11/19/2015  CLINICAL DATA:  C-ARM BRONCHOSCOPY Fluoroscopy was utilized by the requesting physician.  No radiographic interpretation.    ASSESSMENT: This is a very pleasant 80 years old African-American male recently diagnosed with stage IV (T3, N2, M1 B) non-small cell lung cancer, adenocarcinoma presented with large left lower lobe consolidative mass in addition to bilateral pulmonary nodules, right hilar and subcarinal lymphadenopathy as well as suspicious bony lesion diagnosed in  February 2017.  PLAN: I had a lengthy discussion with the patient and his family today about his current disease stage, prognosis and treatment options. I discussed with the patient the results of the CT scan of the chest and showed him and his family the images of the scan. I recommended for the patient to complete the staging workup by ordering a PET scan in addition to MRI of the brain to rule out brain metastasis. I also requested his cellblock to be sent to [facility] one for molecular study as well as PDL 1 expression. I will arrange for the patient to come back for follow-up visit in 2 weeks for reevaluation and more detailed discussion of  his treatment options based on the final staging workup and molecular studies. I discussed with him briefly the treatment with systemic chemotherapy with carboplatin and [place] versus palliative care versus immunotherapy versus targeted therapy based on his molecular study. The patient was seen during the multidisciplinary thoracic oncology clinic today by medical oncology, thoracic navigator, social worker and physical therapist. He was advised to call immediately if he has any concerning symptoms in the interval. The patient voices understanding of current disease status and treatment options and is in agreement with the current care plan.  All questions were answered. The patient knows to call the clinic with any problems, questions or concerns. We can certainly see the patient much sooner if necessary.  Thank you so much for allowing me to participate in the care of Isaiah Blakes. I will continue to follow up the patient with you and assist in his care.  I spent 40 minutes counseling the patient face to face. The total time spent in the appointment was 60 minutes.  Disclaimer: This note was dictated with voice recognition software. Similar sounding words can inadvertently be transcribed and may not be corrected upon review.   Falesha Schommer  K. December 02, 2015, 1:53 PM

## 2015-12-02 NOTE — Progress Notes (Signed)
Subjective:    Patient ID: Matthew Berger, male    DOB: [birth year], 80 y.o.   MRN: [id number]  HPI 80 year old male with history of non-resolving left lower lobe opacity as well as bilateral parenchymal lung nodules enlarging in size on repeat CT imaging. Patient was hospitalized in February and underwent video bronchoscopy on 11/19/15. This showed an endobronchial mass within the left side. I cannot find the bronchoscopy report to review the exact findings but imaging does appear as such from what is scanned into the computer. Endobronchial fine-needle aspiration performed as well as lavage which shows no infectious etiology but did show malignant cells consistent with adenocarcinoma. He reports starting in December he would have a cough that started predominantly at night that was not resolving. He reports progressively worsening dyspnea. Intermittently he would produce a white mucus. Denies any hemoptysis. No chest pain or pressure. No fever, chills, or sweats. No adenopathy in his neck, groin, or axilla. Denies any headache, focal vision loss, weakness, numbness, or tingling. No fever, chills, or sweats. Unintentional weight loss of 10 pounds. He reports his last colonoscopy was some time ago but he is unsure. Denies any history of polyps.  Review of Systems No nausea, emesis, dysphagia or odynophagia. No melena or hematochezia. No dysuria or hematuria.  A pertinent 14 point review of systems is negative except as per the history of presenting illness.  No Known Allergies  Current Outpatient Prescriptions on File Prior to Visit  Medication Sig Dispense Refill  . apixaban (ELIQUIS) 5 MG TABS tablet Take 1 tablet (5 mg total) by mouth 2 (two) times daily. 60 tablet 0  . metoprolol tartrate (LOPRESSOR) 25 MG tablet Take 25 mg by mouth 2 (two) times daily.    . Multiple Vitamins-Minerals (MULTIVITAMIN WITH MINERALS) tablet Take 1 tablet by mouth daily.    . naproxen sodium (ALEVE) 220 MG tablet  Take 440 mg by mouth daily.     . tamsulosin (FLOMAX) 0.4 MG CAPS capsule Take 0.4 mg by mouth daily.     No current facility-administered medications on file prior to visit.    Past Medical History  Diagnosis Date  . Hypertension   . Hypercholesteremia   . Atrial fibrillation ([facility])   . Pneumonia 11/18/2015  . GERD (gastroesophageal reflux disease)   . Non-small cell lung cancer (NSCLC) ([place]) 11/19/15    Past Surgical History  Procedure Laterality Date  . Wisdom tooth extraction    . Rotator cuff repair Right   . Video bronchoscopy Bilateral 11/19/2015    Procedure: VIDEO BRONCHOSCOPY WITH FLUORO;  Surgeon: Rigoberto Noel, [place];  Location: [facility];  Service: Cardiopulmonary;  Laterality: Bilateral;  . Cataract extraction Bilateral     Family History  Problem Relation Age of Onset  . Breast cancer Sister   . Stroke Sister   . Colon cancer Brother   . Cancer Maternal Aunt     x2  . Hypertension Mother   . Stroke Mother   . Breast cancer Cousin   . Diabetes Other   . Hypertension Other   . Hyperlipidemia Other     Social History   Social History  . Marital Status: Married    Spouse Name: N/A  . Number of Children: Y  . Years of Education: N/A   Occupational History  . retired    Social History Main Topics  . Smoking status: Former Smoker -- 2.00 packs/day for 40 years    Types: Cigarettes    Quit  date: 10/02/1990  . Smokeless tobacco: Never Used     Comment: smoked 1-2 ppd.   . Alcohol Use: No  . Drug Use: No  . Sexual Activity: Not Asked   Other Topics Concern  . None   Social History Narrative   Originally from [place]. Always lived in [place]. Previously has traveled to [place], [place], [place], [place], [place], [place], [place], & [place]. Previously worked driving garbage truck. No pets currently. No known mold exposure.       Objective:   Physical Exam BP 112/66 mmHg  Pulse 78  Ht '6\' 1"'$  (1.854 m)  Wt 241 lb 3.2 oz (109.408 kg)  BMI 31.83 kg/m2  SpO2 94% General:  Awake. Alert. No acute  distress. Nieces with him today. Integument:  Warm & dry. No rash on exposed skin. No bruising. Lymphatics:  No appreciated cervical or supraclavicular lymphadenoapthy. HEENT:  Moist mucus membranes. No oral ulcers. No scleral injection or icterus.  Cardiovascular:  Regular rate. No edema. No appreciable JVD.  Pulmonary:  Good aeration & clear to auscultation bilaterally. Symmetric chest wall expansion. No accessory muscle use. Abdomen: Soft. Normal bowel sounds. Protuberant. Grossly nontender. Musculoskeletal:  Normal bulk and tone. Hand grip strength 5/5 bilaterally. No joint deformity or effusion appreciated. Neurological:  CN 2-12 grossly in tact. No meningismus. Moving all 4 extremities equally. Symmetric brachioradialis deep tendon reflexes. Psychiatric:  Mood and affect congruent. Speech normal rhythm, rate & tone.   IMAGING CT CHEST W/ 11/19/15 (personally reviewed by me): Subcarinal lymph node as well as right hilar lymph node measuring over 1 cm in short axis. Enlarging bilateral parenchymal nodules as well as persistent medial left lower lobe consolidation. No pleural effusion or thickening. No pericardial effusion.  CT CHEST W/ 10/06/15 (personally reviewed by me): Medial left lower lobe consolidation with air bronchograms and some groundglass component. Satellite nodules noted within left lower lobe, lingula, and left upper lobe. Right upper lobe nodules with largest measuring approximately 1.3 cm. No pericardial effusion. No pleural effusion or thickening. No pathologic mediastinal adenopathy.  CXR PA/LAT 09/09/15 (personally reviewed by me): Left hilar opacity. No pleural effusion or thickening appreciated. Heart normal in size. Mediastinum normal in contour.  PATHOLOGY  Bronchial Wash LLL 11/19/15:  Malignant cells consistent with non-small cell carcinoma.  Bronchial Wash RUL 11/19/15:  Atypical cells present. Bronchial Brush LLL 11/19/15: Malignant cells consistent with non-small cell  carcinoma.  Endobronchial FNA/TBBx 11/19/15:  Adenocarcionma  MICROBIOLOGY BAL 11/19/15:  Oral [place] / AFB Pending / Fungal Pending  LABS 11/24/15 CBC:  5.9/13.1/38.2/170 BMP:  140/4.1/106/28/11/1.15/113/9.1 Magnesium:  2.1  09/15/15 CBC: 4.6/14.6/42.8/151 BMP: 139/3.8/107/25/17/1.18/119/8.8 LFT: 3.7/6.3/0.6/75/16/13 LDH: 131    Assessment & Plan:  80 year old male with newly diagnosed adenocarcinoma on bronchoscopy. Patient does have an appointment with medical oncology this afternoon. I am unable to review the patient's bronchoscopy report to determine whether or not there were any endobronchial lesions that would be amenable to radiation therapy. The pattern on his CT imaging does raise concern over possible metastatic spread to the lungs versus lung primary. I instructed the patient and his nieces to contact my office if he had any new breathing problems before his next appointment as we would be happy to see him sooner.  1. Adenocarcinoma/non-small cell lung cancer: Has an appointment with medical oncology this afternoon. Referring the patient to radiation oncology. I will defer to Dr. [name] on ordering a PET/CT scan and brain MRI to complete staging. 2. Follow-up: Patient to return to clinic in 2-3  months or sooner if needed.  Sonia Baller Ashok Cordia, M.D. [facility] Pulmonary & Critical Care Pager:  [phone] After 3pm or if no response, call [phone] 9:48 AM 12/02/2015

## 2015-12-02 NOTE — Progress Notes (Signed)
Per Dr. [name] I called [facility] to request PDL 1 Keytruda testing.  I was notified after tissue comes back [facility], it will be sent.  I updated Dr. [name].

## 2015-12-08 ENCOUNTER — Telehealth: Payer: Self-pay | Admitting: *Deleted

## 2015-12-08 NOTE — Telephone Encounter (Signed)
Niece, Mcneil Sober called.  "I need the schedule for my uncle.  We did not receive the correct form when there."  Diane reports family is trying to get her the P.O.A. And she is responsible for his appointments.  Provided appointment information and MyChart Proxy information.  Has a letter with an activation code.

## 2015-12-08 NOTE — Telephone Encounter (Signed)
[facility] has received pt sample. Results expected by 12/11/15

## 2015-12-09 ENCOUNTER — Encounter: Payer: Self-pay | Admitting: Pulmonary Disease

## 2015-12-10 ENCOUNTER — Ambulatory Visit (HOSPITAL_COMMUNITY)
Admission: RE | Admit: 2015-12-10 | Discharge: 2015-12-10 | Disposition: A | Discharge status: Home / Self Care | Payer: Medicare Other | Source: Ambulatory Visit | Attending: Internal Medicine | Admitting: Internal Medicine

## 2015-12-10 DIAGNOSIS — C3490 Malignant neoplasm of unspecified part of unspecified bronchus or lung: Secondary | ICD-10-CM | POA: Insufficient documentation

## 2015-12-10 DIAGNOSIS — C3411 Malignant neoplasm of upper lobe, right bronchus or lung: Secondary | ICD-10-CM | POA: Diagnosis not present

## 2015-12-10 DIAGNOSIS — I288 Other diseases of pulmonary vessels: Secondary | ICD-10-CM | POA: Insufficient documentation

## 2015-12-10 DIAGNOSIS — I2510 Atherosclerotic heart disease of native coronary artery without angina pectoris: Secondary | ICD-10-CM | POA: Diagnosis not present

## 2015-12-10 DIAGNOSIS — R599 Enlarged lymph nodes, unspecified: Secondary | ICD-10-CM | POA: Insufficient documentation

## 2015-12-10 LAB — GLUCOSE, CAPILLARY: Glucose-Capillary: 102 mg/dL — ABNORMAL HIGH (ref 65–99)

## 2015-12-10 MED ORDER — FLUDEOXYGLUCOSE F - 18 (FDG) INJECTION
11.8500 | Freq: Once | INTRAVENOUS | Status: AC | PRN
  Administered 2015-12-10: 11.85 via INTRAVENOUS

## 2015-12-10 NOTE — Progress Notes (Addendum)
Thoracic Location of Tumor / Histology: stage IV (T3, N2, M1 B) non-small cell lung cancer, adenocarcinoma presented with large left lower lobe consolidative mass in addition to bilateral pulmonary nodules, right hilar and subcarinal lymphadenopathy as well as suspicious bony lesion diagnosed in February 2017   Patient presented with symptoms of:  He went to his cardiologist  Who did an chest x ray which showed pneumonia.  He was complaining of a cough and shortness of breath.  Biopsies revealed:   11/19/15 Diagnosis Lung, needle/core biopsy(ies), LLL - ADENOCARCINOMA.  Diagnosis BRONCHIAL WASHING LLL, (SPECIMEN 1 OF 3, COLLECTED ON 11/19/15): MALIGNANT CELLS PRESENT, CONSISTENT WITH NON SMALL CELL CARCINOMA.  Diagnosis BRONCHIALWASHING, RUL #2 (SPECIMEN 2 OF 3, COLLECTED ON 11/19/15): ATYPICAL CELLS PRESENT.  Diagnosis BRONCHIAL BRUSHING, LLL (SPECIMEN 3 OF 3, COLLECTED ON 11/19/15): MALIGNANT CELLS PRESENT CONSISTENT WITH NON-SMALL CELL CARCINOMA.  Tobacco/Marijuana/Snuff/ETOH use: has smoked up to 1-2 pack per day for around 40 years but quit in 1992. He denies using alcohol and does not have a history of drug abuse  Past/Anticipated interventions by cardiothoracic surgery, if any: 11/19/15 - Procedure: VIDEO BRONCHOSCOPY WITH FLUORO;  Surgeon: Rigoberto Noel, MD;  Location: [facility];  Service: Cardiopulmonary;  Laterality: Bilateral;  Past/Anticipated interventions by medical oncology, if any: Per Dr. [name] "treatment with systemic chemotherapy with carboplatin and Al[place] versus palliative care versus immunotherapy versus targeted therapy based on his molecular study"  Signs/Symptoms  Weight changes, if any: lost 9 lbs when he was in the hospital because he did not like the food  Respiratory complaints, if any: yes - reports coughing at night.  He reports having white sputum.  He also reports shortness of breath.  Hemoptysis, if any: no  Pain issues, if any:  no  SAFETY  ISSUES:  Prior radiation? no  Pacemaker/ICD? no   Possible current pregnancy?no  Is the patient on methotrexate? no  Current Complaints / other details:  PET scan scheduled on 12/10/15, MR of brain 12/17/15.  Patient is here with his niece and son.  BP 136/73 mmHg  Pulse 69  Temp(Src) 97.8 F (36.6 C) (Oral)  Resp 16  Ht '6\' 1"'$  (1.854 m)  Wt 243 lb 11.2 oz (110.542 kg)  BMI 32.16 kg/m2  SpO2 96%

## 2015-12-13 LAB — FUNGUS CULTURE W SMEAR: FUNGAL SMEAR: NONE SEEN

## 2015-12-15 ENCOUNTER — Ambulatory Visit
Admission: RE | Admit: 2015-12-15 | Discharge: 2015-12-15 | Disposition: A | Discharge status: Home / Self Care | Payer: Medicare Other | Source: Ambulatory Visit | Attending: Radiation Oncology | Admitting: Radiation Oncology

## 2015-12-15 ENCOUNTER — Other Ambulatory Visit (HOSPITAL_BASED_OUTPATIENT_CLINIC_OR_DEPARTMENT_OTHER): Payer: Medicare Other

## 2015-12-15 ENCOUNTER — Encounter: Payer: Self-pay | Admitting: Radiation Oncology

## 2015-12-15 VITALS — BP 136/73 | HR 69 | Temp 97.8°F | Resp 16 | Ht 73.0 in | Wt 243.7 lb

## 2015-12-15 DIAGNOSIS — C3490 Malignant neoplasm of unspecified part of unspecified bronchus or lung: Secondary | ICD-10-CM | POA: Diagnosis present

## 2015-12-15 DIAGNOSIS — J189 Pneumonia, unspecified organism: Secondary | ICD-10-CM | POA: Diagnosis not present

## 2015-12-15 DIAGNOSIS — I4891 Unspecified atrial fibrillation: Secondary | ICD-10-CM | POA: Insufficient documentation

## 2015-12-15 DIAGNOSIS — K219 Gastro-esophageal reflux disease without esophagitis: Secondary | ICD-10-CM | POA: Insufficient documentation

## 2015-12-15 DIAGNOSIS — I10 Essential (primary) hypertension: Secondary | ICD-10-CM | POA: Diagnosis not present

## 2015-12-15 DIAGNOSIS — Z800 Family history of malignant neoplasm of digestive organs: Secondary | ICD-10-CM | POA: Diagnosis not present

## 2015-12-15 DIAGNOSIS — Z803 Family history of malignant neoplasm of breast: Secondary | ICD-10-CM | POA: Diagnosis not present

## 2015-12-15 DIAGNOSIS — Z87891 Personal history of nicotine dependence: Secondary | ICD-10-CM | POA: Insufficient documentation

## 2015-12-15 DIAGNOSIS — E7800 Pure hypercholesterolemia, unspecified: Secondary | ICD-10-CM | POA: Diagnosis not present

## 2015-12-15 DIAGNOSIS — C3432 Malignant neoplasm of lower lobe, left bronchus or lung: Secondary | ICD-10-CM

## 2015-12-15 LAB — CBC WITH DIFFERENTIAL/PLATELET
BASO%: 0.4 % (ref 0.0–2.0)
BASOS ABS: 0 10*3/uL (ref 0.0–0.1)
EOS ABS: 0.6 10*3/uL — AB (ref 0.0–0.5)
EOS%: 10.3 % — AB (ref 0.0–7.0)
HEMATOCRIT: 40.8 % (ref 38.4–49.9)
HEMOGLOBIN: 14.3 g/dL (ref 13.0–17.1)
LYMPH#: 1.6 10*3/uL (ref 0.9–3.3)
LYMPH%: 27.9 % (ref 14.0–49.0)
MCH: 30.8 pg (ref 27.2–33.4)
MCHC: 35 g/dL (ref 32.0–36.0)
MCV: 87.7 fL (ref 79.3–98.0)
MONO#: 0.5 10*3/uL (ref 0.1–0.9)
MONO%: 9.5 % (ref 0.0–14.0)
NEUT#: 2.9 10*3/uL (ref 1.5–6.5)
NEUT%: 51.9 % (ref 39.0–75.0)
NRBC: 0 % (ref 0–0)
PLATELETS: 141 10*3/uL (ref 140–400)
RBC: 4.65 10*6/uL (ref 4.20–5.82)
RDW: 13.3 % (ref 11.0–14.6)
WBC: 5.6 10*3/uL (ref 4.0–10.3)

## 2015-12-15 LAB — COMPREHENSIVE METABOLIC PANEL
ALBUMIN: 3.6 g/dL (ref 3.5–5.0)
ALK PHOS: 76 U/L (ref 40–150)
ALT: 14 U/L (ref 0–55)
AST: 14 U/L (ref 5–34)
Anion Gap: 6 mEq/L (ref 3–11)
BILIRUBIN TOTAL: 0.61 mg/dL (ref 0.20–1.20)
BUN: 14.6 mg/dL (ref 7.0–26.0)
CALCIUM: 8.9 mg/dL (ref 8.4–10.4)
CO2: 25 mEq/L (ref 22–29)
Chloride: 108 mEq/L (ref 98–109)
Creatinine: 1.1 mg/dL (ref 0.7–1.3)
EGFR: 75 mL/min/{1.73_m2} — AB (ref >90)
GLUCOSE: 111 mg/dL (ref 70–140)
Potassium: 4 mEq/L (ref 3.5–5.1)
SODIUM: 139 meq/L (ref 136–145)
TOTAL PROTEIN: 6.8 g/dL (ref 6.4–8.3)

## 2015-12-15 NOTE — Progress Notes (Signed)
Radiation Oncology         [phone] ________________________________  Initial Outpatient Consultation  Name: Matthew Berger MRN: [id number]  Date: 12/15/2015  DOB: August 12, 1936  YY:[id number],KCL, MD  Javier Glazier, MD   REFERRING PHYSICIAN: Javier Glazier, MD  DIAGNOSIS: Stage IV (T3, N2, M1b) non-small cell lung cancer.  HISTORY OF PRESENT ILLNESS::Matthew Berger is a 80 y.o. male who presented to the emergency department with a 2 month history of persistent pneumonia where he was admitted 11/17/2015 and discharged 11/24/2015. He had a CT of the chest with contrast 11/19/2015, revealing enlarged lymph nodes measuring 2.0 x 1.8 cm in the right hilum and a 1.7 x 1.5 cm sub-carinal lymph node. He had a biopsy 11/19/2015, revealing adenocarcinoma of the left lower lung. The emergency department referred him to Oncology to treat his lung lesions. He had a PET scan 12/10/2015, revealing multifocal primary bronchogenic carcinoma, hypermetabolic right hilar and mediastinal nodes, and mild hypermetabolism corresponding to sclerosis involving the T10 and T 12 vertebral bodies. He is here today to discuss radiation therapy. He used to smoke 2 ppd for about 35 years but quit 27 years ago. He also quit drinking alcohol 27 years ago. He is here today to discuss possible radiation therapy with Dr. Sondra Come. He is accompanied by his niece and son. He mentions that he still has shortness of breath. He denies coughing up blood. He denies chest pain, headaches, diplopia, lower back pain, and weakness in his legs since he has been out of the hospital. He does not use cough medicine. He uses an inhaler before he goes to bed to help him sleep. He reports his appetite is good. He has lost about ten pounds the past couple months.   PREVIOUS RADIATION THERAPY: No  PAST MEDICAL HISTORY:  has a past medical history of Hypertension; Hypercholesteremia; Atrial fibrillation ([place]); Pneumonia (11/18/2015); GERD  (gastroesophageal reflux disease); and Non-small cell lung cancer (NSCLC) ([place]) (11/19/15).    PAST SURGICAL HISTORY: Past Surgical History  Procedure Laterality Date  . Wisdom tooth extraction    . Rotator cuff repair Right   . Video bronchoscopy Bilateral 11/19/2015    Procedure: VIDEO BRONCHOSCOPY WITH FLUORO;  Surgeon: Rigoberto Noel, MD;  Location: [facility];  Service: Cardiopulmonary;  Laterality: Bilateral;  . Cataract extraction Bilateral     FAMILY HISTORY: family history includes Breast cancer in his cousin and sister; Cancer in his maternal aunt; Colon cancer in his brother; Diabetes in his other; Hyperlipidemia in his other; Hypertension in his mother and other; Stroke in his mother and sister.  SOCIAL HISTORY:  reports that he quit smoking about 25 years ago. His smoking use included Cigarettes. He has a 80 pack-year smoking history. He has never used smokeless tobacco. He reports that he does not drink alcohol or use illicit drugs.  ALLERGIES: Review of patient's allergies indicates no known allergies.  MEDICATIONS:  Current Outpatient Prescriptions  Medication Sig Dispense Refill  . apixaban (ELIQUIS) 5 MG TABS tablet Take 1 tablet (5 mg total) by mouth 2 (two) times daily. 60 tablet 0  . aspirin 81 MG tablet Take 81 mg by mouth daily.    Marland Kitchen atorvastatin (LIPITOR) 20 MG tablet Take 20 mg by mouth daily.    . Brinzolamide-Brimonidine (SIMBRINZA) 1-0.2 % SUSP Apply 2 drops to eye 2 (two) times daily.    Marland Kitchen diltiazem (DILACOR XR) 180 MG 24 hr capsule Take 180 mg by mouth daily.    . dorzolamide (  TRUSOPT) 2 % ophthalmic solution Place 1 drop into the left eye 2 (two) times daily.    Marland Kitchen latanoprost (XALATAN) 0.005 % ophthalmic solution Place 1 drop into both eyes at bedtime.    . metoprolol tartrate (LOPRESSOR) 25 MG tablet Take 25 mg by mouth 2 (two) times daily.    . Multiple Vitamins-Minerals (MULTIVITAMIN WITH MINERALS) tablet Take 1 tablet by mouth daily.    . naproxen  sodium (ALEVE) 220 MG tablet Take 440 mg by mouth daily.     . tamsulosin (FLOMAX) 0.4 MG CAPS capsule Take 0.4 mg by mouth daily.     No current facility-administered medications for this encounter.    REVIEW OF SYSTEMS:  A 15 point review of systems is documented in the electronic medical record. This was obtained by the nursing staff. However, I reviewed this with the patient to discuss relevant findings and make appropriate changes.  Pertinent items are noted in HPI.   PHYSICAL EXAM:  height is '6\' 1"'$  (1.854 m) and weight is 243 lb 11.2 oz (110.542 kg). His oral temperature is 97.8 F (36.6 C). His blood pressure is 136/73 and his pulse is 69. His respiration is 16 and oxygen saturation is 96%.   General: Alert and oriented, in no acute distress HEENT: Head is normocephalic. Extraocular movements are intact. Oropharynx is clear. He has  partials in place Neck: Neck is supple, no palpable cervical or supraclavicular lymphadenopathy. Heart: Regular in rate and rhythm with no murmurs, rubs, or gallops. Chest: Clear to auscultation bilaterally, with no rhonchi, wheezes, or rales. Extremities: No cyanosis or edema. Lymphatics: see Neck Exam Skin: No concerning lesions. Musculoskeletal: symmetric strength and muscle tone throughout. No obvious focalities. Speech is fluent. Coordination is intact. Psychiatric: Judgment and insight are intact. Affect is appropriate.   ECOG = 1  LABORATORY DATA:  Lab Results  Component Value Date   WBC 5.6 12/15/2015   HGB 14.3 12/15/2015   HCT 40.8 12/15/2015   MCV 87.7 12/15/2015   PLT 141 12/15/2015   NEUTROABS 2.9 12/15/2015   Lab Results  Component Value Date   NA 139 12/15/2015   K 4.0 12/15/2015   CL 106 11/24/2015   CO2 25 12/15/2015   GLUCOSE 111 12/15/2015   CREATININE 1.1 12/15/2015   CALCIUM 8.9 12/15/2015      RADIOGRAPHY: Dg Chest 2 View  11/17/2015  CLINICAL DATA:  Cough, 4 days duration. Recent history of pneumonia. EXAM:  CHEST  2 VIEW COMPARISON:  11/12/2015.  09/29/2015. FINDINGS: Infiltrate in the left perihilar region and left lower lobe have worsened. Multiple nodular shadows demonstrated at CT are subtly visible on the chest radiograph. No effusions. No bony abnormalities. Atherosclerosis affects the aorta. IMPRESSION: Worsened infiltrate in the left perihilar region and left lower lobe. Persistent faint nodular opacities in both lungs. Though there is almost certainly a component of pneumonia, I am concerned about malignancy and metastatic disease in this case. I would suggest that bronchoscopy at least be considered in this case. Electronically Signed   By: Nelson Chimes M.D.   On: 11/17/2015 11:55   Ct Chest W Contrast  11/19/2015  CLINICAL DATA:  Lung metastases.  Atrial fibrillation EXAM: CT CHEST WITH CONTRAST TECHNIQUE: Multidetector CT imaging of the chest was performed during intravenous contrast administration. CONTRAST:  99m OMNIPAQUE IOHEXOL 300 MG/ML  SOLN COMPARISON:  Chest CT October 06, 2015; chest radiograph November 17, 2015 FINDINGS: Mediastinum/Lymph Nodes: Thyroid appears normal. There are several stable subcentimeter mediastinal lymph  nodes. There is a focal lymph node in the right hilum which appears slightly larger than on recent prior study. This lymph node currently measures 2.0 x 1.8 cm. There is a mildly prominent sub- carinal lymph node measuring 1.7 x 1.5 cm. There is no thoracic aortic aneurysm or dissection. The visualized great vessels appear normal. There are scattered foci of atherosclerotic change in aorta. Pericardium is upper limits normal in thickness. Lungs/Pleura: There is extensive left lower lobe consolidation, also present previously. Multiple parenchymal lung nodular opacities are noted throughout the lungs bilaterally. There is a nodular lesion in the anterior segment of the right upper lobe currently measuring 1.5 x 1.2 cm, compared to 1.3 x 1.0 cm on the prior study. Multiple  other nodular lesions appear either stable or slightly increased in size compared to 6 weeks prior. There is relative confluence in the right upper lobe anteriorly compared to the previous study, felt to be indicative of progression of neoplastic change in this area. There are smaller scattered nodular lesions in the left upper lobe with the largest lesion in this area measuring 5 mm. In the right middle lobe, there are new small nodular opacities as well as a slightly irregular ground-glass type opacity measuring 1.6 x 1.0 cm. Multiple nodular lesions are noted in the left lower lobe with several nodular lesions in this area larger. The largest individual nodular lesion in the left lower lobe currently measures 1.3 x 1.1 cm; on the recent prior study, this same nodular lesion measured 1.1 x 0.8 cm. Upper abdomen: In the visualized upper abdomen, there is hepatic steatosis. No adrenal lesions are identified. There is atherosclerotic change in the upper abdominal aorta. Musculoskeletal: There is a mixed sclerotic and lytic lesion involving the T10 vertebral body and pedicles bilaterally. There is also mixed sclerosis and lucency in the L2 vertebral body, incompletely visualized. IMPRESSION: There are multiple pulmonary nodular lesions which have increased in number compared to 6 weeks prior. Several nodular lesions that were present previously are slightly increased in size. There is a new enlarged lymph node in the right hilum. There is also a prominent lymph node in the sub- carinal region which may be marginally more prominent than on prior study. 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 There is continued airspace consolidation throughout much of the left lower lobe, stable. Electronically Signed   By: Lowella Grip III M.D.   On: 11/19/2015 07:06   Nm Pet Image Initial (pi) Skull Base To  Thigh  12/10/2015  CLINICAL DATA:  Initial treatment strategy for staging of non-small-cell lung cancer. Left lower lobe adenocarcinoma via bronchoscopy. EXAM: NUCLEAR MEDICINE PET SKULL BASE TO THIGH TECHNIQUE: 11.9 mCi F-18 FDG was injected intravenously. Full-ring PET imaging was performed from the skull base to thigh after the radiotracer. CT data was obtained and used for attenuation correction and anatomic localization. FASTING BLOOD GLUCOSE:  Value: 102 mg/dl COMPARISON:  Chest CT 11/19/2015. FINDINGS: NECK No areas of abnormal hypermetabolism. CHEST Right upper lobe hypermetabolic pulmonary nodule measures 1.5 cm and a S.U.V. max of 3.9 on image 27/series 6. Similar in size on 11/19/2015. Left base consolidation with areas of nodularity. The left base consolidation corresponds to heterogeneous hypermetabolism, including at a S.U.V. max of 5.5. Right hilar hypermetabolic adenopathy, including at 1.7 cm on the prior diagnostic CT and a S.U.V. max of 5.5. Subcarinal node measures 9 mm and  a S.U.V. max of 4.3 on image 71/series 4. ABDOMEN/PELVIS No suspicious abdominal hypermetabolism. A focus of hypermetabolism within the posterior central prostate may relate to prior TURP defect. SKELETON Mild hypermetabolism corresponding to sclerosis involving the T10 and L2 vertebral bodies. Example at a S.U.V. max of 4.4 involving T10. CT IMAGES PERFORMED FOR ATTENUATION CORRECTION No cervical adenopathy. Mild cardiomegaly with LAD coronary artery atherosclerosis. Centrilobular emphysema. Bilateral upper lobe pulmonary nodules which are primarily too small for PET resolution. Pulmonary artery enlargement, including 3.3 cm outflow tract. Right renal cyst. A retroaortic left renal vein. Moderate to marked prostatomegaly. IMPRESSION: 1. Multifocal primary bronchogenic carcinoma, including hypermetabolic left lower lobe consolidation and bilateral pulmonary nodularity, including a hypermetabolic right upper lobe nodule. 2.  Hypermetabolic right hilar and mediastinal nodes, most consistent with nodal metastasis. 3. Mild hypermetabolism corresponding to sclerosis involving the T10 and T12 vertebral bodies, favored to represent metastatic disease. 4.  Atherosclerosis, including within the coronary arteries. 5. Pulmonary artery enlargement suggests pulmonary arterial hypertension. Electronically Signed   By: Abigail Miyamoto M.D.   On: 12/10/2015 14:49   Dg Chest Port 1 View  11/19/2015  CLINICAL DATA:  Post bronchoscopy EXAM: PORTABLE CHEST 1 VIEW COMPARISON:  2/15/ 17 FINDINGS: Cardiomediastinal silhouette is stable. Persistent infiltrate in left lower lobe retro hilar region. Vague nodules bilateral upper lobe are poorly visualized. There is no pneumothorax. No pulmonary edema or pleural effusion. IMPRESSION: Persistent infiltrate in left lower lobe retro hilar region. Vague nodules bilateral upper lobe are poorly visualized. There is no pneumothorax. Electronically Signed   By: Lahoma Crocker M.D.   On: 11/19/2015 15:28   Dg C-arm Bronchoscopy  11/19/2015  CLINICAL DATA:  C-ARM BRONCHOSCOPY Fluoroscopy was utilized by the requesting physician.  No radiographic interpretation.      IMPRESSION: Mr. Peruski is a 80 yo male with Stage IV non-small cell lung cancer of the left lower lobe.  Molecular studies are pending at this time, he may be a good candidate for treatment with systemic chemotherapy with carbopatin and Alimta,  immunotherapy, or targeted therapy. No plans for radiation therapy at this time since his spine metastasis is early and he asymptomatic from these areas. Nothing to palliate in the chest either.  PLAN: He has a brain MR with and without contrast 12/17/2015. If his MRI shows brain metastases, we can discuss treatment for that. He has an appointment with Dr. [name] 12/20/2015 to go over systemic options and to review MRI findings.   ------------------------------------------------  Blair Promise, PhD,  MD    This document serves as a record of services personally performed by Gery Pray, MD. It was created on his behalf by Lendon Collar, a trained medical scribe. The creation of this record is based on the scribe's personal observations and the provider's statements to them. This document has been checked and approved by the attending provider.

## 2015-12-15 NOTE — Progress Notes (Signed)
Please see the Nurse Progress Note in the MD Initial Consult Encounter for this patient. 

## 2015-12-15 NOTE — Addendum Note (Signed)
Encounter addended by: Jacqulyn Liner, RN on: 12/15/2015 10:35 AM<BR>     Documentation filed: Inpatient Patient Education, Charges VN

## 2015-12-17 ENCOUNTER — Ambulatory Visit (HOSPITAL_COMMUNITY)
Admission: RE | Admit: 2015-12-17 | Discharge: 2015-12-17 | Disposition: A | Discharge status: Home / Self Care | Payer: Medicare Other | Source: Ambulatory Visit | Attending: Internal Medicine | Admitting: Internal Medicine

## 2015-12-17 DIAGNOSIS — C3490 Malignant neoplasm of unspecified part of unspecified bronchus or lung: Secondary | ICD-10-CM | POA: Diagnosis present

## 2015-12-17 DIAGNOSIS — R9089 Other abnormal findings on diagnostic imaging of central nervous system: Secondary | ICD-10-CM | POA: Insufficient documentation

## 2015-12-17 MED ORDER — GADOBENATE DIMEGLUMINE 529 MG/ML IV SOLN
20.0000 mL | Freq: Once | INTRAVENOUS | Status: AC | PRN
  Administered 2015-12-17: 20 mL via INTRAVENOUS

## 2015-12-20 ENCOUNTER — Encounter (HOSPITAL_COMMUNITY): Payer: Self-pay

## 2015-12-20 ENCOUNTER — Telehealth: Payer: Self-pay | Admitting: Internal Medicine

## 2015-12-20 ENCOUNTER — Ambulatory Visit (HOSPITAL_BASED_OUTPATIENT_CLINIC_OR_DEPARTMENT_OTHER): Payer: Medicare Other

## 2015-12-20 ENCOUNTER — Ambulatory Visit (HOSPITAL_BASED_OUTPATIENT_CLINIC_OR_DEPARTMENT_OTHER): Payer: Medicare Other | Admitting: Internal Medicine

## 2015-12-20 ENCOUNTER — Encounter: Payer: Self-pay | Admitting: Internal Medicine

## 2015-12-20 ENCOUNTER — Telehealth: Payer: Self-pay | Admitting: *Deleted

## 2015-12-20 VITALS — BP 141/68 | HR 74 | Temp 97.8°F | Resp 18 | Ht 73.0 in | Wt 245.5 lb

## 2015-12-20 DIAGNOSIS — C3432 Malignant neoplasm of lower lobe, left bronchus or lung: Secondary | ICD-10-CM

## 2015-12-20 DIAGNOSIS — C3490 Malignant neoplasm of unspecified part of unspecified bronchus or lung: Secondary | ICD-10-CM

## 2015-12-20 DIAGNOSIS — M8589 Other specified disorders of bone density and structure, multiple sites: Secondary | ICD-10-CM

## 2015-12-20 MED ORDER — DEXAMETHASONE 4 MG PO TABS: 4.0000 mg | ORAL_TABLET | Freq: Two times a day (BID) | ORAL | Status: DC

## 2015-12-20 MED ORDER — PROCHLORPERAZINE MALEATE 10 MG PO TABS: 10.0000 mg | ORAL_TABLET | Freq: Four times a day (QID) | ORAL | Status: DC | PRN

## 2015-12-20 MED ORDER — CYANOCOBALAMIN 1000 MCG/ML IJ SOLN
1000.0000 ug | Freq: Once | INTRAMUSCULAR | Status: AC
  Administered 2015-12-20: 1000 ug via INTRAMUSCULAR

## 2015-12-20 MED ORDER — FOLIC ACID 1 MG PO TABS
1.0000 mg | ORAL_TABLET | Freq: Every day | ORAL | Status: DC
Start: 2015-12-20 — End: 2016-02-15

## 2015-12-20 MED ORDER — DEXAMETHASONE 4 MG PO TABS: ORAL_TABLET | ORAL | Status: DC

## 2015-12-20 NOTE — Telephone Encounter (Signed)
perp of to sch pt appt-MW sch trmt -gave pt copy of avs

## 2015-12-20 NOTE — Progress Notes (Signed)
[facility] [phone]) [phone]   Fax:(336) [phone]  OFFICE PROGRESS NOTE  [name], MD [address] [place] [address]  DIAGNOSIS: Stage IV (T3, N2, M1b) non-small cell lung cancer, adenocarcinoma with negative EGFR mutation, negative ALK gene translocation, negative ROS 1, presented with large left lower lobe consolidative mass in addition to bilateral pulmonary nodules, right hilar and subcarinal lymphadenopathy as well as suspicious bony lesion diagnosed in February 2017.  PRIOR THERAPY: None.  CURRENT THERAPY: Systemic chemotherapy with carboplatin for AUC of 5 and Alimta 500 MG/M2 every 3 weeks. First dose 12/29/2015.  INTERVAL HISTORY: Matthew Berger 80 y.o. male returns to the clinic today for follow-up visit accompanied by several family members. The patient is feeling fine today with no specific complaints. He denied having any significant chest pain but continues to have shortness breath with exertion with mild cough and no hemoptysis. He denied having any significant fever or chills. He has no nausea or vomiting. He denied having any significant weight loss or night sweats. He had several studies performed recently including a PET scan as well as MRI of the brain. He also has molecular studies by [employer] one that showed no actionable mutation. PDL 1 is still pending. The patient is here today for evaluation and discussion of his treatment options.  MEDICAL HISTORY: Past Medical History  Diagnosis Date  . Hypertension   . Hypercholesteremia   . Atrial fibrillation ([place])   . Pneumonia 11/18/2015  . GERD (gastroesophageal reflux disease)   . Non-small cell lung cancer (NSCLC) ([place]) 11/19/15    ALLERGIES:  has No Known Allergies.  MEDICATIONS:  Current Outpatient Prescriptions  Medication Sig Dispense Refill  . apixaban (ELIQUIS) 5 MG TABS tablet Take 1 tablet (5 mg total) by mouth 2 (two) times daily. 60 tablet 0  . aspirin 81 MG tablet  Take 81 mg by mouth daily.    Marland Kitchen atorvastatin (LIPITOR) 20 MG tablet Take 20 mg by mouth daily.    . Brinzolamide-Brimonidine (SIMBRINZA) 1-0.2 % SUSP Apply 2 drops to eye 2 (two) times daily.    Marland Kitchen diltiazem (DILACOR XR) 180 MG 24 hr capsule Take 180 mg by mouth daily.    . dorzolamide (TRUSOPT) 2 % ophthalmic solution Place 1 drop into the left eye 2 (two) times daily.    Marland Kitchen latanoprost (XALATAN) 0.005 % ophthalmic solution Place 1 drop into both eyes at bedtime.    . metoprolol tartrate (LOPRESSOR) 25 MG tablet Take 25 mg by mouth 2 (two) times daily.    . Multiple Vitamins-Minerals (MULTIVITAMIN WITH MINERALS) tablet Take 1 tablet by mouth daily.    . naproxen sodium (ALEVE) 220 MG tablet Take 440 mg by mouth daily.     . tamsulosin (FLOMAX) 0.4 MG CAPS capsule Take 0.4 mg by mouth daily.     No current facility-administered medications for this visit.    SURGICAL HISTORY:  Past Surgical History  Procedure Laterality Date  . Wisdom tooth extraction    . Rotator cuff repair Right   . Video bronchoscopy Bilateral 11/19/2015    Procedure: VIDEO BRONCHOSCOPY WITH FLUORO;  Surgeon: Rigoberto Noel, MD;  Location: [facility];  Service: Cardiopulmonary;  Laterality: Bilateral;  . Cataract extraction Bilateral     REVIEW OF SYSTEMS:  Constitutional: negative Eyes: negative Ears, nose, mouth, throat, and face: negative Respiratory: positive for cough and dyspnea on exertion Cardiovascular: negative Gastrointestinal: negative Genitourinary:negative Integument/breast: negative Hematologic/lymphatic: negative Musculoskeletal:negative Neurological: negative Behavioral/Psych: negative  Endocrine: negative Allergic/Immunologic: negative   PHYSICAL EXAMINATION: General appearance: alert, cooperative and no distress Head: Normocephalic, without obvious abnormality, atraumatic Neck: no adenopathy, no JVD, supple, symmetrical, trachea midline and thyroid not enlarged, symmetric, no  tenderness/mass/nodules Lymph nodes: Cervical, supraclavicular, and axillary nodes normal. Resp: clear to auscultation bilaterally Back: symmetric, no curvature. ROM normal. No CVA tenderness. Cardio: regular rate and rhythm, S1, S2 normal, no murmur, click, rub or gallop GI: soft, non-tender; bowel sounds normal; no masses,  no organomegaly Extremities: extremities normal, atraumatic, no cyanosis or edema Neurologic: Alert and oriented X 3, normal strength and tone. Normal symmetric reflexes. Normal coordination and gait  ECOG PERFORMANCE STATUS: 1 - Symptomatic but completely ambulatory  Blood pressure 141/68, pulse 74, temperature 97.8 F (36.6 C), temperature source Oral, resp. rate 18, height _0  (1.854 m), weight 245 lb 8 oz (111.358 kg), SpO2 98 %.  LABORATORY DATA: Lab Results  Component Value Date   WBC 5.6 12/15/2015   HGB 14.3 12/15/2015   HCT 40.8 12/15/2015   MCV 87.7 12/15/2015   PLT 141 12/15/2015      Chemistry      Component Value Date/Time   NA 139 12/15/2015 0744   NA 140 11/24/2015 0453   K 4.0 12/15/2015 0744   K 4.1 11/24/2015 0453   CL 106 11/24/2015 0453   CO2 25 12/15/2015 0744   CO2 28 11/24/2015 0453   BUN 14.6 12/15/2015 0744   BUN 11 11/24/2015 0453   CREATININE 1.1 12/15/2015 0744   CREATININE 1.15 11/24/2015 0453      Component Value Date/Time   CALCIUM 8.9 12/15/2015 0744   CALCIUM 9.1 11/24/2015 0453   ALKPHOS 76 12/15/2015 0744   ALKPHOS 77 11/17/2015 1143   AST 14 12/15/2015 0744   AST 32 11/17/2015 1143   ALT 14 12/15/2015 0744   ALT 19 11/17/2015 1143   BILITOT 0.61 12/15/2015 0744   BILITOT 0.5 11/17/2015 1143       RADIOGRAPHIC STUDIES: Mr Jeri Cos Wo Contrast  01/07/2016  CLINICAL DATA:  80 year old male with recent diagnosis of non-small cell lung cancer. Staging. Subsequent encounter. EXAM: MRI HEAD WITHOUT AND WITH CONTRAST TECHNIQUE: Multiplanar, multiecho pulse sequences of the brain and surrounding structures were  obtained without and with intravenous contrast. CONTRAST:  20m MULTIHANCE GADOBENATE DIMEGLUMINE 529 MG/ML IV SOLN COMPARISON:  PET-CT 12/10/2015 FINDINGS: No midline shift, mass effect, or evidence of intracranial mass lesion. No abnormal enhancement identified. No dural thickening identified. Negative visualized spinal cord. Visible cervical spine remarkable for abnormal T1 marrow signal in the left C3-C4 posterior elements, but without apparent enhancement -probably is degenerative in nature. Other visible bone marrow signal is within normal limits. No restricted diffusion to suggest acute infarction. No ventriculomegaly, extra-axial collection or acute intracranial hemorrhage. Cervicomedullary junction and pituitary are within normal limits. Major intracranial vascular flow voids are within normal limits. Mild for age scattered nonspecific cerebral white matter T2 and FLAIR hyperintensity. Suggestion of a chronic micro hemorrhage in the right periatrial white matter series 9, image 12, no other chronic cerebral blood products No cortical encephalomalacia. Identified. Can't exclude a punctate focus of abnormal enhancement along the right seventh and eighth cranial nerves segments which appear normal on T2 weighted imaging (series 12, image 16). Otherwise negative visualized internal auditory structures. Mastoids are clear. Trace paranasal sinus mucosal thickening. Postoperative changes to both globes. Negative orbit and scalp soft tissues. IMPRESSION: 1. No definite metastatic disease or acute intracranial abnormality. Attention directed on followup  to the right IAC to confirm apparent punctate enhancement is stable, probably reflecting vascular artifact. 2. Suspect degenerative bone marrow signal changes at the left C3-C4 facets. Electronically Signed   By: Genevie Ann M.D.   On: 12/17/2015 10:56   Nm Pet Image Initial (pi) Skull Base To Thigh  12/10/2015  CLINICAL DATA:  Initial treatment strategy for staging  of non-small-cell lung cancer. Left lower lobe adenocarcinoma via bronchoscopy. EXAM: NUCLEAR MEDICINE PET SKULL BASE TO THIGH TECHNIQUE: 11.9 mCi F-18 FDG was injected intravenously. Full-ring PET imaging was performed from the skull base to thigh after the radiotracer. CT data was obtained and used for attenuation correction and anatomic localization. FASTING BLOOD GLUCOSE:  Value: 102 mg/dl COMPARISON:  Chest CT 11/19/2015. FINDINGS: NECK No areas of abnormal hypermetabolism. CHEST Right upper lobe hypermetabolic pulmonary nodule measures 1.5 cm and a S.U.V. max of 3.9 on image 27/series 6. Similar in size on 11/19/2015. Left base consolidation with areas of nodularity. The left base consolidation corresponds to heterogeneous hypermetabolism, including at a S.U.V. max of 5.5. Right hilar hypermetabolic adenopathy, including at 1.7 cm on the prior diagnostic CT and a S.U.V. max of 5.5. Subcarinal node measures 9 mm and a S.U.V. max of 4.3 on image 71/series 4. ABDOMEN/PELVIS No suspicious abdominal hypermetabolism. A focus of hypermetabolism within the posterior central prostate may relate to prior TURP defect. SKELETON Mild hypermetabolism corresponding to sclerosis involving the T10 and L2 vertebral bodies. Example at a S.U.V. max of 4.4 involving T10. CT IMAGES PERFORMED FOR ATTENUATION CORRECTION No cervical adenopathy. Mild cardiomegaly with LAD coronary artery atherosclerosis. Centrilobular emphysema. Bilateral upper lobe pulmonary nodules which are primarily too small for PET resolution. Pulmonary artery enlargement, including 3.3 cm outflow tract. Right renal cyst. A retroaortic left renal vein. Moderate to marked prostatomegaly. IMPRESSION: 1. Multifocal primary bronchogenic carcinoma, including hypermetabolic left lower lobe consolidation and bilateral pulmonary nodularity, including a hypermetabolic right upper lobe nodule. 2. Hypermetabolic right hilar and mediastinal nodes, most consistent with nodal  metastasis. 3. Mild hypermetabolism corresponding to sclerosis involving the T10 and T12 vertebral bodies, favored to represent metastatic disease. 4.  Atherosclerosis, including within the coronary arteries. 5. Pulmonary artery enlargement suggests pulmonary arterial hypertension. Electronically Signed   By: Abigail Miyamoto M.D.   On: 12/10/2015 14:49    ASSESSMENT AND PLAN: This is a very pleasant 80 years old African-American male with metastatic non-small cell lung cancer, adenocarcinoma. The molecular study showed no actionable mutations. PDL 1 is a still pending. I had a lengthy discussion with the patient and his family about his current disease stage, prognosis and treatment options. I gave the patient the option of palliative care and hospice referral versus consideration of systemic chemotherapy with carboplatin for AUC of 5 and Alimta 500 MG/M2 every 3 weeks. The patient is interested in treatment. I discussed with the patient adverse effects of this treatment including but not limited to alopecia, myelosuppression, nausea and vomiting, peripheral neuropathy, liver or renal dysfunction. We will arrange for the patient to receive vitamin B 12 injection today. The patient would also receive prescription for Compazine 10 mg by mouth every 6 hours as needed for nausea, Decadron 4 mg by mouth twice a day, the day before, day of and day after the chemotherapy in addition to folic acid 1 mg by mouth daily. I will arrange for the patient to have a chemotherapy education class before starting the first dose of the chemotherapy. He is expected to start the first cycle of  this treatment on 12/29/2015. The patient would come back for follow-up visit in 2 weeks for reevaluation and management of any adverse effect of his treatment. He was advised to call immediately if he has any concerning symptoms in the interval. The patient voices understanding of current disease status and treatment options and is in  agreement with the current care plan.  All questions were answered. The patient knows to call the clinic with any problems, questions or concerns. We can certainly see the patient much sooner if necessary.  I spent 20 minutes counseling the patient face to face. The total time spent in the appointment was 30 minutes.  Disclaimer: This note was dictated with voice recognition software. Similar sounding words can inadvertently be transcribed and may not be corrected upon review.

## 2015-12-20 NOTE — Addendum Note (Signed)
Addended by: Curt Bears on: 12/20/2015 03:48 PM   Modules accepted: Orders

## 2015-12-20 NOTE — Telephone Encounter (Signed)
Per staff message and POF I have scheduled appts. Advised scheduler of appts. JMW  

## 2015-12-21 ENCOUNTER — Other Ambulatory Visit: Payer: Medicare Other

## 2015-12-21 ENCOUNTER — Encounter: Payer: Self-pay | Admitting: *Deleted

## 2015-12-29 ENCOUNTER — Other Ambulatory Visit (HOSPITAL_BASED_OUTPATIENT_CLINIC_OR_DEPARTMENT_OTHER): Payer: Medicare Other

## 2015-12-29 ENCOUNTER — Ambulatory Visit (HOSPITAL_BASED_OUTPATIENT_CLINIC_OR_DEPARTMENT_OTHER): Payer: Medicare Other

## 2015-12-29 ENCOUNTER — Encounter: Payer: Self-pay | Admitting: Pharmacist

## 2015-12-29 VITALS — BP 150/68 | HR 77 | Temp 98.5°F | Resp 18

## 2015-12-29 DIAGNOSIS — Z5111 Encounter for antineoplastic chemotherapy: Secondary | ICD-10-CM | POA: Diagnosis not present

## 2015-12-29 DIAGNOSIS — C3490 Malignant neoplasm of unspecified part of unspecified bronchus or lung: Secondary | ICD-10-CM

## 2015-12-29 DIAGNOSIS — C3432 Malignant neoplasm of lower lobe, left bronchus or lung: Secondary | ICD-10-CM | POA: Diagnosis not present

## 2015-12-29 LAB — COMPREHENSIVE METABOLIC PANEL
ALBUMIN: 3.5 g/dL (ref 3.5–5.0)
ALK PHOS: 74 U/L (ref 40–150)
ALT: 13 U/L (ref 0–55)
ANION GAP: 11 meq/L (ref 3–11)
AST: 13 U/L (ref 5–34)
BUN: 14.7 mg/dL (ref 7.0–26.0)
CALCIUM: 9.1 mg/dL (ref 8.4–10.4)
CO2: 18 mEq/L — ABNORMAL LOW (ref 22–29)
Chloride: 109 mEq/L (ref 98–109)
Creatinine: 1 mg/dL (ref 0.7–1.3)
EGFR: 80 mL/min/{1.73_m2} — AB (ref >90)
GLUCOSE: 187 mg/dL — AB (ref 70–140)
POTASSIUM: 4.2 meq/L (ref 3.5–5.1)
SODIUM: 137 meq/L (ref 136–145)
Total Bilirubin: 0.51 mg/dL (ref 0.20–1.20)
Total Protein: 6.8 g/dL (ref 6.4–8.3)

## 2015-12-29 LAB — CBC WITH DIFFERENTIAL/PLATELET
BASO%: 0 % (ref 0.0–2.0)
BASOS ABS: 0 10*3/uL (ref 0.0–0.1)
EOS%: 0 % (ref 0.0–7.0)
Eosinophils Absolute: 0 10*3/uL (ref 0.0–0.5)
HEMATOCRIT: 39.7 % (ref 38.4–49.9)
HGB: 13.9 g/dL (ref 13.0–17.1)
LYMPH#: 0.6 10*3/uL — AB (ref 0.9–3.3)
LYMPH%: 5 % — AB (ref 14.0–49.0)
MCH: 30.7 pg (ref 27.2–33.4)
MCHC: 35 g/dL (ref 32.0–36.0)
MCV: 87.6 fL (ref 79.3–98.0)
MONO#: 0.2 10*3/uL (ref 0.1–0.9)
MONO%: 1.9 % (ref 0.0–14.0)
NEUT#: 11.1 10*3/uL — ABNORMAL HIGH (ref 1.5–6.5)
NEUT%: 93.1 % — AB (ref 39.0–75.0)
Platelets: 141 10*3/uL (ref 140–400)
RBC: 4.53 10*6/uL (ref 4.20–5.82)
RDW: 13.2 % (ref 11.0–14.6)
WBC: 11.9 10*3/uL — ABNORMAL HIGH (ref 4.0–10.3)

## 2015-12-29 MED ORDER — SODIUM CHLORIDE 0.9 % IV SOLN
500.0000 mg/m2 | Freq: Once | INTRAVENOUS | Status: AC
  Administered 2015-12-29: 1200 mg via INTRAVENOUS
  Filled 2015-12-29: qty 40

## 2015-12-29 MED ORDER — SODIUM CHLORIDE 0.9 % IV SOLN
568.0000 mg | Freq: Once | INTRAVENOUS | Status: AC
  Administered 2015-12-29: 570 mg via INTRAVENOUS
  Filled 2015-12-29: qty 57

## 2015-12-29 MED ORDER — PALONOSETRON HCL INJECTION 0.25 MG/5ML
INTRAVENOUS | Status: AC
  Filled 2015-12-29: qty 5

## 2015-12-29 MED ORDER — SODIUM CHLORIDE 0.9 % IV SOLN
Freq: Once | INTRAVENOUS | Status: AC
  Administered 2015-12-29: 09:00:00 via INTRAVENOUS

## 2015-12-29 MED ORDER — PALONOSETRON HCL INJECTION 0.25 MG/5ML
0.2500 mg | Freq: Once | INTRAVENOUS | Status: AC
  Administered 2015-12-29: 0.25 mg via INTRAVENOUS

## 2015-12-29 MED ORDER — SODIUM CHLORIDE 0.9 % IV SOLN
10.0000 mg | Freq: Once | INTRAVENOUS | Status: AC
  Administered 2015-12-29: 10 mg via INTRAVENOUS
  Filled 2015-12-29: qty 1

## 2015-12-29 NOTE — Progress Notes (Signed)
I spoke w/ pt at the start of his 1st infusion today re: Alimta + Aleve use. He takes 440 mg Aleve every day for pain control.  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  He noticed pain during that time and the Aleve helps manage that for him. I suggested that he refrain from using Aleve for 2 days before Alimta, the day of and for 2 days after Alimta tx.   Plasma conc of Alimta may be increased by Aleve due to decreased renal elimination of Alimta. I provided him an information sheet re: drug interaction. Kennith Center, Pharm.D., CPP 12/29/2015'@9'$ :38 AM

## 2015-12-29 NOTE — Patient Instructions (Signed)
[facility] Discharge Instructions for Patients Receiving Chemotherapy  Today you received the following chemotherapy agents Alimta/Carboplatin  To help prevent nausea and vomiting after your treatment, we encourage you to take your nausea medication as directed by your MD  If you develop nausea and vomiting that is not controlled by your nausea medication, call the clinic.   BELOW ARE SYMPTOMS THAT SHOULD BE REPORTED IMMEDIATELY:  *FEVER GREATER THAN 100.5 F  *CHILLS WITH OR WITHOUT FEVER  NAUSEA AND VOMITING THAT IS NOT CONTROLLED WITH YOUR NAUSEA MEDICATION  *UNUSUAL SHORTNESS OF BREATH  *UNUSUAL BRUISING OR BLEEDING  TENDERNESS IN MOUTH AND THROAT WITH OR WITHOUT PRESENCE OF ULCERS  *URINARY PROBLEMS  *BOWEL PROBLEMS  UNUSUAL RASH Items with * indicate a potential emergency and should be followed up as soon as possible.  Feel free to call the clinic you have any questions or concerns. The clinic phone number is [phone].  Please show the [facility] at check-in to the Emergency Department and triage nurse.   Pemetrexed injection Alimta What is this medicine? PEMETREXED (PEM e TREX ed) is a chemotherapy drug. This medicine affects cells that are rapidly growing, such as cancer cells and cells in your mouth and stomach. It is usually used to treat lung cancers like non-small cell lung cancer and mesothelioma. It may also be used to treat other cancers. This medicine may be used for other purposes; ask your health care provider or pharmacist if you have questions. What should I tell my health care provider before I take this medicine? They need to know if you have any of these conditions: -if you frequently drink alcohol containing beverages -infection (especially a virus infection such as chickenpox, cold sores, or herpes) -kidney disease -liver disease -low blood counts, like low platelets, red bloods, or white blood cells -an unusual or  allergic reaction to pemetrexed, mannitol, other medicines, foods, dyes, or preservatives -pregnant or trying to get pregnant -breast-feeding How should I use this medicine? This drug is given as an infusion into a vein. It is administered in a hospital or clinic by a specially trained health care professional. Talk to your pediatrician regarding the use of this medicine in children. Special care may be needed. Overdosage: If you think you have taken too much of this medicine contact a poison control center or emergency room at once. NOTE: This medicine is only for you. Do not share this medicine with others. What if I miss a dose? It is important not to miss your dose. Call your doctor or health care professional if you are unable to keep an appointment. What may interact with this medicine? -aspirin and aspirin-like medicines -medicines to increase blood counts like filgrastim, pegfilgrastim, sargramostim -methotrexate -NSAIDS, medicines for pain and inflammation, like ibuprofen or naproxen -probenecid -pyrimethamine -vaccines Talk to your doctor or health care professional before taking any of these medicines: -acetaminophen -aspirin -ibuprofen -ketoprofen -naproxen This list may not describe all possible interactions. Give your health care provider a list of all the medicines, herbs, non-prescription drugs, or dietary supplements you use. Also tell them if you smoke, drink alcohol, or use illegal drugs. Some items may interact with your medicine. What should I watch for while using this medicine? Visit your doctor for checks on your progress. This drug may make you feel generally unwell. This is not uncommon, as chemotherapy can affect healthy cells as well as cancer cells. Report any side effects. Continue your course of treatment even though  you feel ill unless your doctor tells you to stop. In some cases, you may be given additional medicines to help with side effects. Follow all  directions for their use. Call your doctor or health care professional for advice if you get a fever, chills or sore throat, or other symptoms of a cold or flu. Do not treat yourself. This drug decreases your body's ability to fight infections. Try to avoid being around people who are sick. This medicine may increase your risk to bruise or bleed. Call your doctor or health care professional if you notice any unusual bleeding. Be careful brushing and flossing your teeth or using a toothpick because you may get an infection or bleed more easily. If you have any dental work done, tell your dentist you are receiving this medicine. Avoid taking products that contain aspirin, acetaminophen, ibuprofen, naproxen, or ketoprofen unless instructed by your doctor. These medicines may hide a fever. Call your doctor or health care professional if you get diarrhea or mouth sores. Do not treat yourself. To protect your kidneys, drink water or other fluids as directed while you are taking this medicine. Men and women must use effective birth control while taking this medicine. You may also need to continue using effective birth control for a time after stopping this medicine. Do not become pregnant while taking this medicine. Tell your doctor right away if you think that you or your partner might be pregnant. There is a potential for serious side effects to an unborn child. Talk to your health care professional or pharmacist for more information. Do not breast-feed an infant while taking this medicine. This medicine may lower sperm counts. What side effects may I notice from receiving this medicine? Side effects that you should report to your doctor or health care professional as soon as possible: -allergic reactions like skin rash, itching or hives, swelling of the face, lips, or tongue -low blood counts - this medicine may decrease the number of white blood cells, red blood cells and platelets. You may be at increased  risk for infections and bleeding. -signs of infection - fever or chills, cough, sore throat, pain or difficulty passing urine -signs of decreased platelets or bleeding - bruising, pinpoint red spots on the skin, black, tarry stools, blood in the urine -signs of decreased red blood cells - unusually weak or tired, fainting spells, lightheadedness -breathing problems, like a dry cough -changes in emotions or moods -chest pain -confusion -diarrhea -high blood pressure -mouth or throat sores or ulcers -pain, swelling, warmth in the leg -pain on swallowing -swelling of the ankles, feet, hands -trouble passing urine or change in the amount of urine -vomiting -yellowing of the eyes or skin Side effects that usually do not require medical attention (report to your doctor or health care professional if they continue or are bothersome): -hair loss -loss of appetite -nausea -stomach upset This list may not describe all possible side effects. Call your doctor for medical advice about side effects. You may report side effects to FDA at 1-800-FDA-1088. Where should I keep my medicine? This drug is given in a hospital or clinic and will not be stored at home. NOTE: This sheet is a summary. It may not cover all possible information. If you have questions about this medicine, talk to your doctor, pharmacist, or health care provider.    2016, [employer] Standard. (2008-04-21 13:24:03)   Carboplatin injection What is this medicine? CARBOPLATIN (KAR boe pla tin) is a chemotherapy drug. It targets  fast dividing cells, like cancer cells, and causes these cells to die. This medicine is used to treat ovarian cancer and many other cancers. This medicine may be used for other purposes; ask your health care provider or pharmacist if you have questions. What should I tell my health care provider before I take this medicine? They need to know if you have any of these conditions: -blood disorders -hearing  problems -kidney disease -recent or ongoing radiation therapy -an unusual or allergic reaction to carboplatin, cisplatin, other chemotherapy, other medicines, foods, dyes, or preservatives -pregnant or trying to get pregnant -breast-feeding How should I use this medicine? This drug is usually given as an infusion into a vein. It is administered in a hospital or clinic by a specially trained health care professional. Talk to your pediatrician regarding the use of this medicine in children. Special care may be needed. Overdosage: If you think you have taken too much of this medicine contact a poison control center or emergency room at once. NOTE: This medicine is only for you. Do not share this medicine with others. What if I miss a dose? It is important not to miss a dose. Call your doctor or health care professional if you are unable to keep an appointment. What may interact with this medicine? -medicines for seizures -medicines to increase blood counts like filgrastim, pegfilgrastim, sargramostim -some antibiotics like amikacin, gentamicin, neomycin, streptomycin, tobramycin -vaccines Talk to your doctor or health care professional before taking any of these medicines: -acetaminophen -aspirin -ibuprofen -ketoprofen -naproxen This list may not describe all possible interactions. Give your health care provider a list of all the medicines, herbs, non-prescription drugs, or dietary supplements you use. Also tell them if you smoke, drink alcohol, or use illegal drugs. Some items may interact with your medicine. What should I watch for while using this medicine? Your condition will be monitored carefully while you are receiving this medicine. You will need important blood work done while you are taking this medicine. This drug may make you feel generally unwell. This is not uncommon, as chemotherapy can affect healthy cells as well as cancer cells. Report any side effects. Continue your course  of treatment even though you feel ill unless your doctor tells you to stop. In some cases, you may be given additional medicines to help with side effects. Follow all directions for their use. Call your doctor or health care professional for advice if you get a fever, chills or sore throat, or other symptoms of a cold or flu. Do not treat yourself. This drug decreases your body's ability to fight infections. Try to avoid being around people who are sick. This medicine may increase your risk to bruise or bleed. Call your doctor or health care professional if you notice any unusual bleeding. Be careful brushing and flossing your teeth or using a toothpick because you may get an infection or bleed more easily. If you have any dental work done, tell your dentist you are receiving this medicine. Avoid taking products that contain aspirin, acetaminophen, ibuprofen, naproxen, or ketoprofen unless instructed by your doctor. These medicines may hide a fever. Do not become pregnant while taking this medicine. Women should inform their doctor if they wish to become pregnant or think they might be pregnant. There is a potential for serious side effects to an unborn child. Talk to your health care professional or pharmacist for more information. Do not breast-feed an infant while taking this medicine. What side effects may I notice from  receiving this medicine? Side effects that you should report to your doctor or health care professional as soon as possible: -allergic reactions like skin rash, itching or hives, swelling of the face, lips, or tongue -signs of infection - fever or chills, cough, sore throat, pain or difficulty passing urine -signs of decreased platelets or bleeding - bruising, pinpoint red spots on the skin, black, tarry stools, nosebleeds -signs of decreased red blood cells - unusually weak or tired, fainting spells, lightheadedness -breathing problems -changes in hearing -changes in  vision -chest pain -high blood pressure -low blood counts - This drug may decrease the number of white blood cells, red blood cells and platelets. You may be at increased risk for infections and bleeding. -nausea and vomiting -pain, swelling, redness or irritation at the injection site -pain, tingling, numbness in the hands or feet -problems with balance, talking, walking -trouble passing urine or change in the amount of urine Side effects that usually do not require medical attention (report to your doctor or health care professional if they continue or are bothersome): -hair loss -loss of appetite -metallic taste in the mouth or changes in taste This list may not describe all possible side effects. Call your doctor for medical advice about side effects. You may report side effects to FDA at 1-800-FDA-1088. Where should I keep my medicine? This drug is given in a hospital or clinic and will not be stored at home. NOTE: This sheet is a summary. It may not cover all possible information. If you have questions about this medicine, talk to your doctor, pharmacist, or health care provider.    2016, [employer] Standard. (2007-12-24 14:38:05)

## 2015-12-29 NOTE — Progress Notes (Signed)
Pt was seen by pharmacist to discuss pt cardiac medications (asa and plavix) and interactions with chemo. Pt verbalized understanding and will monitor labs accordingly before any future treatments. 1020-Pt tolerated alimta/carboplatin. Printed out avs with chemo information. Encouraged pt to drink plenty of fluids and reviewed nausea medication at home. Pt denies any complaints at this time during or after chemo. In basket follow up call placed and pt is aware. Pt verbalized udnerstanding of possible side effects of chemo and when to call if pt has any concern or emergency.

## 2015-12-30 ENCOUNTER — Telehealth: Payer: Self-pay | Admitting: Medical Oncology

## 2015-12-30 NOTE — Telephone Encounter (Signed)
-----   Message from [place], RN sent at 12/29/2015  9:12 AM EDT ----- Regarding: Dr. [name]. FU chemo 1st alimta/carboplatin Dr. [name]. First time chemo alimta/carboplatin. Fu phone call needed.

## 2015-12-30 NOTE — Telephone Encounter (Signed)
Chemotherapy f/u-pt is doing well , just sleepy. His is eating well, no problems.

## 2015-12-31 ENCOUNTER — Ambulatory Visit: Payer: Medicare Other | Admitting: Nutrition

## 2015-12-31 NOTE — Progress Notes (Signed)
80 year old male diagnosed with non-small cell lung cancer.  He is a patient of Dr. [name].  Past medical history includes hypertension, hypercholesterolemia, atrial fibrillation, and GERD.  Medications include Lipitor, Decadron, Folvite, multivitamin, Compazine.  Labs were reviewed.  Height: 6 feet 1 inch. Weight: 249.8 pounds. Usual body weight: 250 pounds. BMI: 32.96.  Patient reports he feels well and has a good appetite. Reports increasing energy after chemotherapy. Denies nutrition impact symptoms. Wife has many questions about what foods patient can and cannot eat.  Nutrition diagnosis:  Food and nutrition related knowledge deficit related to new diagnosis of non-small cell lung cancer and associated treatments as evidenced by no prior need for nutrition related information.  Intervention:  Educated on importance of consuming adequate protein and calories to promote weight maintenance and maintain lean body mass. Reviewed high protein foods sources with patient. Answered questions about food safety and provided fact sheet. Also provided fact sheets on increasing calories and protein and soft protein foods. Questions were answered.  Teach back method used.  Contact information was given.  Monitoring, evaluation, goals: Patient will tolerate adequate calories and protein to minimize weight loss throughout treatment.  Next visit: Patient will contact me for questions or concerns. \ **Disclaimer: This note was dictated with voice recognition software. Similar sounding words can inadvertently be transcribed and this note may contain transcription errors which may not have been corrected upon publication of note.**

## 2016-01-03 LAB — AFB CULTURE WITH SMEAR (NOT AT ARMC): Acid Fast Smear: NONE SEEN

## 2016-01-05 ENCOUNTER — Other Ambulatory Visit (HOSPITAL_BASED_OUTPATIENT_CLINIC_OR_DEPARTMENT_OTHER): Payer: Medicare Other

## 2016-01-05 ENCOUNTER — Encounter: Payer: Self-pay | Admitting: Internal Medicine

## 2016-01-05 ENCOUNTER — Ambulatory Visit (HOSPITAL_BASED_OUTPATIENT_CLINIC_OR_DEPARTMENT_OTHER): Payer: Medicare Other | Admitting: Internal Medicine

## 2016-01-05 VITALS — BP 131/69 | HR 89 | Temp 97.8°F | Resp 18 | Ht 73.0 in | Wt 238.9 lb

## 2016-01-05 DIAGNOSIS — R5383 Other fatigue: Secondary | ICD-10-CM

## 2016-01-05 DIAGNOSIS — C3490 Malignant neoplasm of unspecified part of unspecified bronchus or lung: Secondary | ICD-10-CM | POA: Diagnosis not present

## 2016-01-05 DIAGNOSIS — Z5111 Encounter for antineoplastic chemotherapy: Secondary | ICD-10-CM

## 2016-01-05 HISTORY — DX: Encounter for antineoplastic chemotherapy: Z51.11

## 2016-01-05 LAB — COMPREHENSIVE METABOLIC PANEL
ALT: 13 U/L (ref 0–55)
AST: 14 U/L (ref 5–34)
Albumin: 3.3 g/dL — ABNORMAL LOW (ref 3.5–5.0)
Alkaline Phosphatase: 63 U/L (ref 40–150)
Anion Gap: 7 mEq/L (ref 3–11)
BUN: 22.6 mg/dL (ref 7.0–26.0)
CHLORIDE: 104 meq/L (ref 98–109)
CO2: 27 meq/L (ref 22–29)
CREATININE: 1.3 mg/dL (ref 0.7–1.3)
Calcium: 9.1 mg/dL (ref 8.4–10.4)
EGFR: 61 mL/min/{1.73_m2} — ABNORMAL LOW (ref >90)
GLUCOSE: 135 mg/dL (ref 70–140)
POTASSIUM: 4.2 meq/L (ref 3.5–5.1)
SODIUM: 137 meq/L (ref 136–145)
Total Bilirubin: 1.2 mg/dL (ref 0.20–1.20)
Total Protein: 6.8 g/dL (ref 6.4–8.3)

## 2016-01-05 LAB — CBC WITH DIFFERENTIAL/PLATELET
BASO%: 0.2 % (ref 0.0–2.0)
BASOS ABS: 0 10*3/uL (ref 0.0–0.1)
EOS%: 3.9 % (ref 0.0–7.0)
Eosinophils Absolute: 0.2 10*3/uL (ref 0.0–0.5)
HCT: 40 % (ref 38.4–49.9)
HGB: 14 g/dL (ref 13.0–17.1)
LYMPH#: 0.7 10*3/uL — AB (ref 0.9–3.3)
LYMPH%: 16.4 % (ref 14.0–49.0)
MCH: 31.2 pg (ref 27.2–33.4)
MCHC: 35 g/dL (ref 32.0–36.0)
MCV: 89.1 fL (ref 79.3–98.0)
MONO#: 0.2 10*3/uL (ref 0.1–0.9)
MONO%: 3.6 % (ref 0.0–14.0)
NEUT#: 3.1 10*3/uL (ref 1.5–6.5)
NEUT%: 75.9 % — AB (ref 39.0–75.0)
Platelets: 122 10*3/uL — ABNORMAL LOW (ref 140–400)
RBC: 4.49 10*6/uL (ref 4.20–5.82)
RDW: 13 % (ref 11.0–14.6)
WBC: 4.1 10*3/uL (ref 4.0–10.3)

## 2016-01-05 NOTE — Progress Notes (Signed)
[facility] [phone]   [phone]  OFFICE PROGRESS NOTE  [name], MD [address] [place] [address]  DIAGNOSIS: Stage IV (T3, N2, M1b) non-small cell lung cancer, adenocarcinoma with negative EGFR mutation, negative ALK gene translocation, negative ROS 1, presented with large left lower lobe consolidative mass in addition to bilateral pulmonary nodules, right hilar and subcarinal lymphadenopathy as well as suspicious bony lesion diagnosed in February 2017.  PRIOR THERAPY: None.  CURRENT THERAPY: Systemic chemotherapy with carboplatin for AUC of 5 and Alimta 500 MG/M2 every 3 weeks. First dose 12/29/2015.  INTERVAL HISTORY: Matthew Berger 80 y.o. male returns to the clinic today for follow-up visit accompanied by several family members. The patient related the first week of his systemic chemotherapy with carboplatin and Alimta fairly well except for one episode of nausea results with Compazine. He also has some fatigue for a few days. He denied having any significant fever or chills. He has no chest pain, shortness of breath, cough or hemoptysis. He lost few pounds last week. The patient is here today for evaluation and repeat blood work.  MEDICAL HISTORY: Past Medical History  Diagnosis Date  . Hypertension   . Hypercholesteremia   . Atrial fibrillation ([facility])   . Pneumonia 11/18/2015  . GERD (gastroesophageal reflux disease)   . Non-small cell lung cancer (NSCLC) ([place]) 11/19/15    ALLERGIES:  has No Known Allergies.  MEDICATIONS:  Current Outpatient Prescriptions  Medication Sig Dispense Refill  . apixaban (ELIQUIS) 5 MG TABS tablet Take 1 tablet (5 mg total) by mouth 2 (two) times daily. 60 tablet 0  . aspirin 81 MG tablet Take 81 mg by mouth daily.    Marland Kitchen atorvastatin (LIPITOR) 20 MG tablet Take 20 mg by mouth daily.    . Brinzolamide-Brimonidine (SIMBRINZA) 1-0.2 % SUSP Apply 2 drops to eye 2 (two) times daily.    Marland Kitchen  dexamethasone (DECADRON) 4 MG tablet 4 mg by mouth twice a day the day before, day of and day after the chemotherapy every 3 weeks 40 tablet 1  . diltiazem (DILACOR XR) 180 MG 24 hr capsule Take 180 mg by mouth daily.    . dorzolamide (TRUSOPT) 2 % ophthalmic solution Place 1 drop into the left eye 2 (two) times daily.    . folic acid (FOLVITE) 1 MG tablet Take 1 tablet (1 mg total) by mouth daily. 30 tablet 4  . latanoprost (XALATAN) 0.005 % ophthalmic solution Place 1 drop into both eyes at bedtime.    . metoprolol tartrate (LOPRESSOR) 25 MG tablet Take 25 mg by mouth 2 (two) times daily.    . Multiple Vitamins-Minerals (MULTIVITAMIN WITH MINERALS) tablet Take 1 tablet by mouth daily.    . naproxen sodium (ALEVE) 220 MG tablet Take 440 mg by mouth daily.     . prochlorperazine (COMPAZINE) 10 MG tablet Take 1 tablet (10 mg total) by mouth every 6 (six) hours as needed for nausea or vomiting. 30 tablet 0  . tamsulosin (FLOMAX) 0.4 MG CAPS capsule Take 0.4 mg by mouth daily.     No current facility-administered medications for this visit.    SURGICAL HISTORY:  Past Surgical History  Procedure Laterality Date  . Wisdom tooth extraction    . Rotator cuff repair Right   . Video bronchoscopy Bilateral 11/19/2015    Procedure: VIDEO BRONCHOSCOPY WITH FLUORO;  Surgeon: Rigoberto Noel, MD;  Location: [facility];  Service: Cardiopulmonary;  Laterality: Bilateral;  .  Cataract extraction Bilateral     REVIEW OF SYSTEMS:  A comprehensive review of systems was negative except for: Constitutional: positive for fatigue and weight loss   PHYSICAL EXAMINATION: General appearance: alert, cooperative and no distress Head: Normocephalic, without obvious abnormality, atraumatic Neck: no adenopathy, no JVD, supple, symmetrical, trachea midline and thyroid not enlarged, symmetric, no tenderness/mass/nodules Lymph nodes: Cervical, supraclavicular, and axillary nodes normal. Resp: clear to auscultation  bilaterally Back: symmetric, no curvature. ROM normal. No CVA tenderness. Cardio: regular rate and rhythm, S1, S2 normal, no murmur, click, rub or gallop GI: soft, non-tender; bowel sounds normal; no masses,  no organomegaly Extremities: extremities normal, atraumatic, no cyanosis or edema Neurologic: Alert and oriented X 3, normal strength and tone. Normal symmetric reflexes. Normal coordination and gait  ECOG PERFORMANCE STATUS: 1 - Symptomatic but completely ambulatory  Blood pressure 131/69, pulse 89, temperature 97.8 F (36.6 C), temperature source Oral, resp. rate 18, height '6\' 1"'$  (1.854 m), weight 238 lb 14.4 oz (108.364 kg), SpO2 96 %.  LABORATORY DATA: Lab Results  Component Value Date   WBC 4.1 01/05/2016   HGB 14.0 01/05/2016   HCT 40.0 01/05/2016   MCV 89.1 01/05/2016   PLT 122* 01/05/2016      Chemistry      Component Value Date/Time   NA 137 12/29/2015 0803   NA 140 11/24/2015 0453   K 4.2 12/29/2015 0803   K 4.1 11/24/2015 0453   CL 106 11/24/2015 0453   CO2 18* 12/29/2015 0803   CO2 28 11/24/2015 0453   BUN 14.7 12/29/2015 0803   BUN 11 11/24/2015 0453   CREATININE 1.0 12/29/2015 0803   CREATININE 1.15 11/24/2015 0453      Component Value Date/Time   CALCIUM 9.1 12/29/2015 0803   CALCIUM 9.1 11/24/2015 0453   ALKPHOS 74 12/29/2015 0803   ALKPHOS 77 11/17/2015 1143   AST 13 12/29/2015 0803   AST 32 11/17/2015 1143   ALT 13 12/29/2015 0803   ALT 19 11/17/2015 1143   BILITOT 0.51 12/29/2015 0803   BILITOT 0.5 11/17/2015 1143       RADIOGRAPHIC STUDIES: Mr Jeri Cos Wo Contrast  2015/12/30  CLINICAL DATA:  80 year old male with recent diagnosis of non-small cell lung cancer. Staging. Subsequent encounter. EXAM: MRI HEAD WITHOUT AND WITH CONTRAST TECHNIQUE: Multiplanar, multiecho pulse sequences of the brain and surrounding structures were obtained without and with intravenous contrast. CONTRAST:  84m MULTIHANCE GADOBENATE DIMEGLUMINE 529 MG/ML IV SOLN  COMPARISON:  PET-CT 12/10/2015 FINDINGS: No midline shift, mass effect, or evidence of intracranial mass lesion. No abnormal enhancement identified. No dural thickening identified. Negative visualized spinal cord. Visible cervical spine remarkable for abnormal T1 marrow signal in the left C3-C4 posterior elements, but without apparent enhancement -probably is degenerative in nature. Other visible bone marrow signal is within normal limits. No restricted diffusion to suggest acute infarction. No ventriculomegaly, extra-axial collection or acute intracranial hemorrhage. Cervicomedullary junction and pituitary are within normal limits. Major intracranial vascular flow voids are within normal limits. Mild for age scattered nonspecific cerebral white matter T2 and FLAIR hyperintensity. Suggestion of a chronic micro hemorrhage in the right periatrial white matter series 9, image 12, no other chronic cerebral blood products No cortical encephalomalacia. Identified. Can't exclude a punctate focus of abnormal enhancement along the right seventh and eighth cranial nerves segments which appear normal on T2 weighted imaging (series 12, image 16). Otherwise negative visualized internal auditory structures. Mastoids are clear. Trace paranasal sinus mucosal thickening. Postoperative changes  to both globes. Negative orbit and scalp soft tissues. IMPRESSION: 1. No definite metastatic disease or acute intracranial abnormality. Attention directed on followup to the right IAC to confirm apparent punctate enhancement is stable, probably reflecting vascular artifact. 2. Suspect degenerative bone marrow signal changes at the left C3-C4 facets. Electronically Signed   By: Matthew Ann M.D.   On: 12/17/2015 10:56   Nm Pet Image Initial (pi) Skull Base To Thigh  12/10/2015  CLINICAL DATA:  Initial treatment strategy for staging of non-small-cell lung cancer. Left lower lobe adenocarcinoma via bronchoscopy. EXAM: NUCLEAR MEDICINE PET SKULL BASE  TO THIGH TECHNIQUE: 11.9 mCi F-18 FDG was injected intravenously. Full-ring PET imaging was performed from the skull base to thigh after the radiotracer. CT data was obtained and used for attenuation correction and anatomic localization. FASTING BLOOD GLUCOSE:  Value: 102 mg/dl COMPARISON:  Chest CT 11/19/2015. FINDINGS: NECK No areas of abnormal hypermetabolism. CHEST Right upper lobe hypermetabolic pulmonary nodule measures 1.5 cm and a S.U.V. max of 3.9 on image 27/series 6. Similar in size on 11/19/2015. Left base consolidation with areas of nodularity. The left base consolidation corresponds to heterogeneous hypermetabolism, including at a S.U.V. max of 5.5. Right hilar hypermetabolic adenopathy, including at 1.7 cm on the prior diagnostic CT and a S.U.V. max of 5.5. Subcarinal node measures 9 mm and a S.U.V. max of 4.3 on image 71/series 4. ABDOMEN/PELVIS No suspicious abdominal hypermetabolism. A focus of hypermetabolism within the posterior central prostate may relate to prior TURP defect. SKELETON Mild hypermetabolism corresponding to sclerosis involving the T10 and L2 vertebral bodies. Example at a S.U.V. max of 4.4 involving T10. CT IMAGES PERFORMED FOR ATTENUATION CORRECTION No cervical adenopathy. Mild cardiomegaly with LAD coronary artery atherosclerosis. Centrilobular emphysema. Bilateral upper lobe pulmonary nodules which are primarily too small for PET resolution. Pulmonary artery enlargement, including 3.3 cm outflow tract. Right renal cyst. A retroaortic left renal vein. Moderate to marked prostatomegaly. IMPRESSION: 1. Multifocal primary bronchogenic carcinoma, including hypermetabolic left lower lobe consolidation and bilateral pulmonary nodularity, including a hypermetabolic right upper lobe nodule. 2. Hypermetabolic right hilar and mediastinal nodes, most consistent with nodal metastasis. 3. Mild hypermetabolism corresponding to sclerosis involving the T10 and T12 vertebral bodies, favored to  represent metastatic disease. 4.  Atherosclerosis, including within the coronary arteries. 5. Pulmonary artery enlargement suggests pulmonary arterial hypertension. Electronically Signed   By: Matthew Miyamoto M.D.   On: 12/10/2015 14:49    ASSESSMENT AND PLAN: This is a very pleasant 80 years old African-American male with metastatic non-small cell lung cancer, adenocarcinoma. The molecular study showed no actionable mutations. The patient is currently undergoing systemic chemotherapy with carboplatin and Alimta status post 1 cycle. He tolerated the first week of his treatment fairly well with no significant complaints except for mild fatigue and few pounds of weight loss. I recommended for him to continue his treatment as his schedule and he would come back for follow-up visit in 2 weeks for evaluation before starting cycle #2. He was advised to call immediately if he has any concerning symptoms in the interval. The patient voices understanding of current disease status and treatment options and is in agreement with the current care plan.  All questions were answered. The patient knows to call the clinic with any problems, questions or concerns. We can certainly see the patient much sooner if necessary.  Disclaimer: This note was dictated with voice recognition software. Similar sounding words can inadvertently be transcribed and may not be corrected upon review.

## 2016-01-06 ENCOUNTER — Telehealth: Payer: Self-pay | Admitting: Internal Medicine

## 2016-01-06 NOTE — Telephone Encounter (Signed)
s.w. pt and advised on April appts....pt ok and aware

## 2016-01-19 ENCOUNTER — Telehealth: Payer: Self-pay | Admitting: Internal Medicine

## 2016-01-19 ENCOUNTER — Ambulatory Visit (HOSPITAL_BASED_OUTPATIENT_CLINIC_OR_DEPARTMENT_OTHER): Payer: Medicare Other | Admitting: Internal Medicine

## 2016-01-19 ENCOUNTER — Other Ambulatory Visit: Payer: Medicare Other

## 2016-01-19 ENCOUNTER — Ambulatory Visit (HOSPITAL_BASED_OUTPATIENT_CLINIC_OR_DEPARTMENT_OTHER): Payer: Medicare Other

## 2016-01-19 ENCOUNTER — Other Ambulatory Visit (HOSPITAL_BASED_OUTPATIENT_CLINIC_OR_DEPARTMENT_OTHER): Payer: Medicare Other

## 2016-01-19 ENCOUNTER — Encounter: Payer: Self-pay | Admitting: Internal Medicine

## 2016-01-19 VITALS — BP 127/60 | HR 69 | Temp 97.5°F | Resp 18 | Ht 73.0 in | Wt 246.1 lb

## 2016-01-19 DIAGNOSIS — C3432 Malignant neoplasm of lower lobe, left bronchus or lung: Secondary | ICD-10-CM

## 2016-01-19 DIAGNOSIS — C3490 Malignant neoplasm of unspecified part of unspecified bronchus or lung: Secondary | ICD-10-CM

## 2016-01-19 DIAGNOSIS — Z5111 Encounter for antineoplastic chemotherapy: Secondary | ICD-10-CM

## 2016-01-19 LAB — COMPREHENSIVE METABOLIC PANEL
ALT: 13 U/L (ref 0–55)
ANION GAP: 10 meq/L (ref 3–11)
AST: 14 U/L (ref 5–34)
Albumin: 3.4 g/dL — ABNORMAL LOW (ref 3.5–5.0)
Alkaline Phosphatase: 66 U/L (ref 40–150)
BILIRUBIN TOTAL: 0.37 mg/dL (ref 0.20–1.20)
BUN: 19.4 mg/dL (ref 7.0–26.0)
CHLORIDE: 109 meq/L (ref 98–109)
CO2: 16 meq/L — AB (ref 22–29)
Calcium: 9.2 mg/dL (ref 8.4–10.4)
Creatinine: 1.2 mg/dL (ref 0.7–1.3)
EGFR: 68 mL/min/{1.73_m2} — AB (ref >90)
Glucose: 230 mg/dl — ABNORMAL HIGH (ref 70–140)
POTASSIUM: 4.2 meq/L (ref 3.5–5.1)
Sodium: 135 mEq/L — ABNORMAL LOW (ref 136–145)
TOTAL PROTEIN: 6.7 g/dL (ref 6.4–8.3)

## 2016-01-19 LAB — CBC WITH DIFFERENTIAL/PLATELET
BASO%: 0.5 % (ref 0.0–2.0)
Basophils Absolute: 0 10*3/uL (ref 0.0–0.1)
EOS ABS: 0 10*3/uL (ref 0.0–0.5)
EOS%: 0 % (ref 0.0–7.0)
HCT: 39.9 % (ref 38.4–49.9)
HGB: 13.3 g/dL (ref 13.0–17.1)
LYMPH%: 11.8 % — AB (ref 14.0–49.0)
MCH: 30.9 pg (ref 27.2–33.4)
MCHC: 33.4 g/dL (ref 32.0–36.0)
MCV: 92.5 fL (ref 79.3–98.0)
MONO#: 0.2 10*3/uL (ref 0.1–0.9)
MONO%: 3.5 % (ref 0.0–14.0)
NEUT#: 4.3 10*3/uL (ref 1.5–6.5)
NEUT%: 84.2 % — AB (ref 39.0–75.0)
PLATELETS: 247 10*3/uL (ref 140–400)
RBC: 4.31 10*6/uL (ref 4.20–5.82)
RDW: 14.4 % (ref 11.0–14.6)
WBC: 5.1 10*3/uL (ref 4.0–10.3)
lymph#: 0.6 10*3/uL — ABNORMAL LOW (ref 0.9–3.3)

## 2016-01-19 MED ORDER — SODIUM CHLORIDE 0.9 % IV SOLN
10.0000 mg | Freq: Once | INTRAVENOUS | Status: AC
  Administered 2016-01-19: 10 mg via INTRAVENOUS
  Filled 2016-01-19: qty 1

## 2016-01-19 MED ORDER — PALONOSETRON HCL INJECTION 0.25 MG/5ML
0.2500 mg | Freq: Once | INTRAVENOUS | Status: AC
  Administered 2016-01-19: 0.25 mg via INTRAVENOUS

## 2016-01-19 MED ORDER — SODIUM CHLORIDE 0.9 % IV SOLN
500.0000 mg | Freq: Once | INTRAVENOUS | Status: AC
  Administered 2016-01-19: 500 mg via INTRAVENOUS
  Filled 2016-01-19: qty 50

## 2016-01-19 MED ORDER — PEMETREXED DISODIUM CHEMO INJECTION 500 MG
500.0000 mg/m2 | Freq: Once | INTRAVENOUS | Status: AC
  Administered 2016-01-19: 1200 mg via INTRAVENOUS
  Filled 2016-01-19: qty 40

## 2016-01-19 MED ORDER — SODIUM CHLORIDE 0.9 % IV SOLN
Freq: Once | INTRAVENOUS | Status: AC
  Administered 2016-01-19: 09:00:00 via INTRAVENOUS

## 2016-01-19 MED ORDER — PALONOSETRON HCL INJECTION 0.25 MG/5ML
INTRAVENOUS | Status: AC
  Filled 2016-01-19: qty 5

## 2016-01-19 NOTE — Addendum Note (Signed)
Addended by: Lucile Crater on: 01/19/2016 09:56 AM   Modules accepted: Medications

## 2016-01-19 NOTE — Patient Instructions (Signed)
[facility] Discharge Instructions for Patients Receiving Chemotherapy  Today you received the following chemotherapy agents Alimta and Carboplatin.  To help prevent nausea and vomiting after your treatment, we encourage you to take your nausea medication as prescribed.   If you develop nausea and vomiting that is not controlled by your nausea medication, call the clinic.   BELOW ARE SYMPTOMS THAT SHOULD BE REPORTED IMMEDIATELY:  *FEVER GREATER THAN 100.5 F  *CHILLS WITH OR WITHOUT FEVER  NAUSEA AND VOMITING THAT IS NOT CONTROLLED WITH YOUR NAUSEA MEDICATION  *UNUSUAL SHORTNESS OF BREATH  *UNUSUAL BRUISING OR BLEEDING  TENDERNESS IN MOUTH AND THROAT WITH OR WITHOUT PRESENCE OF ULCERS  *URINARY PROBLEMS  *BOWEL PROBLEMS  UNUSUAL RASH Items with * indicate a potential emergency and should be followed up as soon as possible.  Feel free to call the clinic you have any questions or concerns. The clinic phone number is [phone].  Please show the CHEMO ALERT CARD at check-in to the Emergency Department and triage nurse.   

## 2016-01-19 NOTE — Telephone Encounter (Signed)
Gave and printed appt sched and avs for pt for April thru June °

## 2016-01-19 NOTE — Progress Notes (Signed)
    [facility] [phone]   [phone]  OFFICE PROGRESS NOTE  MOREIRA,ROY, MD [address] [place] [address]  DIAGNOSIS: Stage IV (T3, N2, M1b) non-small cell lung cancer, adenocarcinoma with negative EGFR mutation, negative ALK gene translocation, negative ROS 1, presented with large left lower lobe consolidative mass in addition to bilateral pulmonary nodules, right hilar and subcarinal lymphadenopathy as well as suspicious bony lesion diagnosed in February 2017.  PRIOR THERAPY: None.  CURRENT THERAPY: Systemic chemotherapy with carboplatin for AUC of 5 and Alimta 500 MG/M2 every 3 weeks. First dose 12/29/2015. Status post 1 cycle  INTERVAL HISTORY: Matthew Berger 80 y.o. male returns to the clinic today for follow-up visit accompanied by his wife. The patient tolerated the first cycle of his systemic chemotherapy with carboplatin and Alimta fairly well. He had no complaints over the last 2 weeks. He denied having any significant fever or chills. He has no chest pain, shortness of breath, cough or hemoptysis. He lost few pounds last week. He is here today for evaluation before starting cycle #2.  MEDICAL HISTORY: Past Medical History  Diagnosis Date  . Hypertension   . Hypercholesteremia   . Atrial fibrillation ([facility])   . Pneumonia 11/18/2015  . GERD (gastroesophageal reflux disease)   . Non-small cell lung cancer (NSCLC) ([facility]) 11/19/15  . Encounter for antineoplastic chemotherapy 01/05/2016    ALLERGIES:  has No Known Allergies.  MEDICATIONS:  Current Outpatient Prescriptions  Medication Sig Dispense Refill  . apixaban (ELIQUIS) 5 MG TABS tablet Take 1 tablet (5 mg total) by mouth 2 (two) times daily. 60 tablet 0  . aspirin 81 MG tablet Take 81 mg by mouth daily.    . atorvastatin (LIPITOR) 20 MG tablet Take 20 mg by mouth daily.    . Brinzolamide-Brimonidine (SIMBRINZA) 1-0.2 % SUSP Apply 2 drops to eye 2 (two) times daily.    .  dexamethasone (DECADRON) 4 MG tablet 4 mg by mouth twice a day the day before, day of and day after the chemotherapy every 3 weeks 40 tablet 1  . diltiazem (DILACOR XR) 180 MG 24 hr capsule Take 180 mg by mouth daily.    . dorzolamide (TRUSOPT) 2 % ophthalmic solution Place 1 drop into the left eye 2 (two) times daily.    . folic acid (FOLVITE) 1 MG tablet Take 1 tablet (1 mg total) by mouth daily. 30 tablet 4  . latanoprost (XALATAN) 0.005 % ophthalmic solution Place 1 drop into both eyes at bedtime.    . metoprolol tartrate (LOPRESSOR) 25 MG tablet Take 25 mg by mouth 2 (two) times daily.    . Multiple Vitamins-Minerals (MULTIVITAMIN WITH MINERALS) tablet Take 1 tablet by mouth daily.    . naproxen sodium (ALEVE) 220 MG tablet Take 440 mg by mouth daily.     . prochlorperazine (COMPAZINE) 10 MG tablet Take 1 tablet (10 mg total) by mouth every 6 (six) hours as needed for nausea or vomiting. 30 tablet 0  . psyllium (METAMUCIL) 58.6 % powder Take 1 packet by mouth daily.    . tamsulosin (FLOMAX) 0.4 MG CAPS capsule Take 0.4 mg by mouth daily.     No current facility-administered medications for this visit.    SURGICAL HISTORY:  Past Surgical History  Procedure Laterality Date  . Wisdom tooth extraction    . Rotator cuff repair Right   . Video bronchoscopy Bilateral 11/19/2015    Procedure: VIDEO BRONCHOSCOPY WITH FLUORO;  Surgeon:   Rigoberto Noel, MD;  Location: [facility];  Service: Cardiopulmonary;  Laterality: Bilateral;  . Cataract extraction Bilateral     REVIEW OF SYSTEMS:  A comprehensive review of systems was negative.   PHYSICAL EXAMINATION: General appearance: alert, cooperative and no distress Head: Normocephalic, without obvious abnormality, atraumatic Neck: no adenopathy, no JVD, supple, symmetrical, trachea midline and thyroid not enlarged, symmetric, no tenderness/mass/nodules Lymph nodes: Cervical, supraclavicular, and axillary nodes normal. Resp: clear to auscultation  bilaterally Back: symmetric, no curvature. ROM normal. No CVA tenderness. Cardio: regular rate and rhythm, S1, S2 normal, no murmur, click, rub or gallop GI: soft, non-tender; bowel sounds normal; no masses,  no organomegaly Extremities: extremities normal, atraumatic, no cyanosis or edema Neurologic: Alert and oriented X 3, normal strength and tone. Normal symmetric reflexes. Normal coordination and gait  ECOG PERFORMANCE STATUS: 1 - Symptomatic but completely ambulatory  Blood pressure 127/60, pulse 69, temperature 97.5 F (36.4 C), temperature source Oral, resp. rate 18, height 6' 1" (1.854 m), weight 246 lb 1.6 oz (111.63 kg), SpO2 97 %.  LABORATORY DATA: Lab Results  Component Value Date   WBC 5.1 01/19/2016   HGB 13.3 01/19/2016   HCT 39.9 01/19/2016   MCV 92.5 01/19/2016   PLT 247 01/19/2016      Chemistry      Component Value Date/Time   NA 137 01/05/2016 1312   NA 140 11/24/2015 0453   K 4.2 01/05/2016 1312   K 4.1 11/24/2015 0453   CL 106 11/24/2015 0453   CO2 27 01/05/2016 1312   CO2 28 11/24/2015 0453   BUN 22.6 01/05/2016 1312   BUN 11 11/24/2015 0453   CREATININE 1.3 01/05/2016 1312   CREATININE 1.15 11/24/2015 0453      Component Value Date/Time   CALCIUM 9.1 01/05/2016 1312   CALCIUM 9.1 11/24/2015 0453   ALKPHOS 63 01/05/2016 1312   ALKPHOS 77 11/17/2015 1143   AST 14 01/05/2016 1312   AST 32 11/17/2015 1143   ALT 13 01/05/2016 1312   ALT 19 11/17/2015 1143   BILITOT 1.20 01/05/2016 1312   BILITOT 0.5 11/17/2015 1143       RADIOGRAPHIC STUDIES: No results found.  ASSESSMENT AND PLAN: This is a very pleasant 80 years old African-American male with metastatic non-small cell lung cancer, adenocarcinoma. The molecular study showed no actionable mutations. The patient is currently undergoing systemic chemotherapy with carboplatin and Alimta status post 1 cycle. He tolerated the first week of his treatment fairly well with no significant complaints  except for mild fatigue the first week after his treatment. I recommended for the patient to proceed with cycle #2 today as a scheduled. I will see him back for follow-up visit in 3 weeks for evaluation before starting cycle #3. He was advised to call immediately if he has any concerning symptoms in the interval. The patient voices understanding of current disease status and treatment options and is in agreement with the current care plan.  All questions were answered. The patient knows to call the clinic with any problems, questions or concerns. We can certainly see the patient much sooner if necessary.  Disclaimer: This note was dictated with voice recognition software. Similar sounding words can inadvertently be transcribed and may not be corrected upon review.

## 2016-01-26 ENCOUNTER — Other Ambulatory Visit (HOSPITAL_BASED_OUTPATIENT_CLINIC_OR_DEPARTMENT_OTHER): Payer: Medicare Other

## 2016-01-26 DIAGNOSIS — C3490 Malignant neoplasm of unspecified part of unspecified bronchus or lung: Secondary | ICD-10-CM

## 2016-01-26 DIAGNOSIS — C3432 Malignant neoplasm of lower lobe, left bronchus or lung: Secondary | ICD-10-CM

## 2016-01-26 LAB — COMPREHENSIVE METABOLIC PANEL
ALK PHOS: 64 U/L (ref 40–150)
ALT: 16 U/L (ref 0–55)
AST: 17 U/L (ref 5–34)
Albumin: 3.3 g/dL — ABNORMAL LOW (ref 3.5–5.0)
Anion Gap: 8 mEq/L (ref 3–11)
BILIRUBIN TOTAL: 0.83 mg/dL (ref 0.20–1.20)
BUN: 22.4 mg/dL (ref 7.0–26.0)
CALCIUM: 9.3 mg/dL (ref 8.4–10.4)
CHLORIDE: 105 meq/L (ref 98–109)
CO2: 26 meq/L (ref 22–29)
Creatinine: 1.2 mg/dL (ref 0.7–1.3)
EGFR: 65 mL/min/{1.73_m2} — ABNORMAL LOW (ref >90)
GLUCOSE: 162 mg/dL — AB (ref 70–140)
POTASSIUM: 4.3 meq/L (ref 3.5–5.1)
SODIUM: 139 meq/L (ref 136–145)
Total Protein: 6.5 g/dL (ref 6.4–8.3)

## 2016-01-26 LAB — CBC WITH DIFFERENTIAL/PLATELET
BASO%: 0.3 % (ref 0.0–2.0)
Basophils Absolute: 0 10*3/uL (ref 0.0–0.1)
EOS ABS: 0 10*3/uL (ref 0.0–0.5)
EOS%: 0.7 % (ref 0.0–7.0)
HCT: 42.9 % (ref 38.4–49.9)
HGB: 14.1 g/dL (ref 13.0–17.1)
LYMPH%: 31.5 % (ref 14.0–49.0)
MCH: 30.2 pg (ref 27.2–33.4)
MCHC: 32.9 g/dL (ref 32.0–36.0)
MCV: 92 fL (ref 79.3–98.0)
MONO#: 0.1 10*3/uL (ref 0.1–0.9)
MONO%: 4.1 % (ref 0.0–14.0)
NEUT#: 1.5 10*3/uL (ref 1.5–6.5)
NEUT%: 63.4 % (ref 39.0–75.0)
Platelets: 167 10*3/uL (ref 140–400)
RBC: 4.66 10*6/uL (ref 4.20–5.82)
RDW: 14.4 % (ref 11.0–14.6)
WBC: 2.4 10*3/uL — AB (ref 4.0–10.3)
lymph#: 0.8 10*3/uL — ABNORMAL LOW (ref 0.9–3.3)

## 2016-02-02 ENCOUNTER — Other Ambulatory Visit (HOSPITAL_BASED_OUTPATIENT_CLINIC_OR_DEPARTMENT_OTHER): Payer: Medicare Other

## 2016-02-02 DIAGNOSIS — C3432 Malignant neoplasm of lower lobe, left bronchus or lung: Secondary | ICD-10-CM

## 2016-02-02 DIAGNOSIS — C3490 Malignant neoplasm of unspecified part of unspecified bronchus or lung: Secondary | ICD-10-CM

## 2016-02-02 LAB — COMPREHENSIVE METABOLIC PANEL
ALBUMIN: 3.2 g/dL — AB (ref 3.5–5.0)
ALT: 18 U/L (ref 0–55)
ANION GAP: 7 meq/L (ref 3–11)
AST: 19 U/L (ref 5–34)
Alkaline Phosphatase: 57 U/L (ref 40–150)
BILIRUBIN TOTAL: 0.56 mg/dL (ref 0.20–1.20)
BUN: 12.2 mg/dL (ref 7.0–26.0)
CALCIUM: 8.8 mg/dL (ref 8.4–10.4)
CO2: 23 mEq/L (ref 22–29)
CREATININE: 1.1 mg/dL (ref 0.7–1.3)
Chloride: 112 mEq/L — ABNORMAL HIGH (ref 98–109)
EGFR: 71 mL/min/{1.73_m2} — ABNORMAL LOW (ref >90)
Glucose: 112 mg/dl (ref 70–140)
Potassium: 4 mEq/L (ref 3.5–5.1)
Sodium: 142 mEq/L (ref 136–145)
TOTAL PROTEIN: 5.8 g/dL — AB (ref 6.4–8.3)

## 2016-02-02 LAB — CBC WITH DIFFERENTIAL/PLATELET
BASO%: 0.4 % (ref 0.0–2.0)
Basophils Absolute: 0 10*3/uL (ref 0.0–0.1)
EOS%: 2.8 % (ref 0.0–7.0)
Eosinophils Absolute: 0.1 10*3/uL (ref 0.0–0.5)
HEMATOCRIT: 35.7 % — AB (ref 38.4–49.9)
HEMOGLOBIN: 12.5 g/dL — AB (ref 13.0–17.1)
LYMPH#: 0.8 10*3/uL — AB (ref 0.9–3.3)
LYMPH%: 29.2 % (ref 14.0–49.0)
MCH: 31.4 pg (ref 27.2–33.4)
MCHC: 35 g/dL (ref 32.0–36.0)
MCV: 89.7 fL (ref 79.3–98.0)
MONO#: 0.5 10*3/uL (ref 0.1–0.9)
MONO%: 17.8 % — ABNORMAL HIGH (ref 0.0–14.0)
NEUT%: 49.8 % (ref 39.0–75.0)
NEUTROS ABS: 1.4 10*3/uL — AB (ref 1.5–6.5)
PLATELETS: 74 10*3/uL — AB (ref 140–400)
RBC: 3.98 10*6/uL — ABNORMAL LOW (ref 4.20–5.82)
RDW: 14.1 % (ref 11.0–14.6)
WBC: 2.8 10*3/uL — AB (ref 4.0–10.3)

## 2016-02-04 ENCOUNTER — Encounter: Payer: Self-pay | Admitting: Pulmonary Disease

## 2016-02-04 ENCOUNTER — Ambulatory Visit (INDEPENDENT_AMBULATORY_CARE_PROVIDER_SITE_OTHER): Payer: Medicare Other | Admitting: Pulmonary Disease

## 2016-02-04 VITALS — BP 106/52 | HR 82 | Ht 73.0 in | Wt 246.0 lb

## 2016-02-04 DIAGNOSIS — R0600 Dyspnea, unspecified: Secondary | ICD-10-CM | POA: Diagnosis not present

## 2016-02-04 DIAGNOSIS — C3492 Malignant neoplasm of unspecified part of left bronchus or lung: Secondary | ICD-10-CM | POA: Diagnosis not present

## 2016-02-04 NOTE — Patient Instructions (Signed)
   Call me if you have any new breathing problems or questions before your next appointment  We will schedule you for a breathing and walking test on or before your next appointment  I will see you back in 1-2 months or sooner if needed  TESTS ORDERED: 1. Full pulmonary function testing on or before next appointment 2. 6 minute walk test on room air on or before next appointment

## 2016-02-04 NOTE — Progress Notes (Signed)
Subjective:    Patient ID: Matthew Berger, male    DOB: Nov 02, 1935, 80 y.o.   MRN: [id number]  C.C.:  Follow-up for Stage IV (T3, N2, M1b) NSCLC LLL & Dyspnea.  HPI Stage IV NSCLC LLL:  Adenocarcinoma on biopsy. Follows with medical oncology. He is without any mutations. Currently on Carboplatin & Alimta. He is currently not undergoing XRT. He does report fatigue from his chemotherapy but denies any significant nausea or emesis.   Dyspnea:  He does report dyspnea with doing yardwork or lifting. Denies any dyspnea at rest. He is coughing intermittently that is productive of a white mucus. He has been using an incentive spirometer to help him expectorate. He reports intermittent wheezing that improves after expectoration.   Review of Systems No fever, chills, or sweats. No chest pain or pressure. Rare headaches. No focal weakness, numbness, or tingling.   No Known Allergies  Current Outpatient Prescriptions on File Prior to Visit  Medication Sig Dispense Refill  . apixaban (ELIQUIS) 5 MG TABS tablet Take 1 tablet (5 mg total) by mouth 2 (two) times daily. 60 tablet 0  . aspirin 81 MG tablet Take 81 mg by mouth daily.    Marland Kitchen atorvastatin (LIPITOR) 20 MG tablet Take 20 mg by mouth daily.    . Brinzolamide-Brimonidine (SIMBRINZA) 1-0.2 % SUSP Apply 2 drops to eye 2 (two) times daily.    Marland Kitchen dexamethasone (DECADRON) 4 MG tablet 4 mg by mouth twice a day the day before, day of and day after the chemotherapy every 3 weeks 40 tablet 1  . diltiazem (DILACOR XR) 180 MG 24 hr capsule Take 180 mg by mouth daily.    . dorzolamide (TRUSOPT) 2 % ophthalmic solution Place 1 drop into the left eye 2 (two) times daily.    . folic acid (FOLVITE) 1 MG tablet Take 1 tablet (1 mg total) by mouth daily. 30 tablet 4  . latanoprost (XALATAN) 0.005 % ophthalmic solution Place 1 drop into both eyes at bedtime.    . metoprolol tartrate (LOPRESSOR) 25 MG tablet Take 25 mg by mouth 2 (two) times daily.    . Multiple  Vitamins-Minerals (MULTIVITAMIN WITH MINERALS) tablet Take 1 tablet by mouth daily.    . naproxen sodium (ALEVE) 220 MG tablet Take 440 mg by mouth daily.     . prochlorperazine (COMPAZINE) 10 MG tablet Take 1 tablet (10 mg total) by mouth every 6 (six) hours as needed for nausea or vomiting. 30 tablet 0  . psyllium (METAMUCIL) 58.6 % powder Take 1 packet by mouth daily.    . tamsulosin (FLOMAX) 0.4 MG CAPS capsule Take 0.4 mg by mouth daily.     No current facility-administered medications on file prior to visit.    Past Medical History  Diagnosis Date  . Hypertension   . Hypercholesteremia   . Atrial fibrillation ([place])   . Pneumonia 11/18/2015  . GERD (gastroesophageal reflux disease)   . Non-small cell lung cancer (NSCLC) ([place]) 11/19/15  . Encounter for antineoplastic chemotherapy 01/05/2016    Past Surgical History  Procedure Laterality Date  . Wisdom tooth extraction    . Rotator cuff repair Right   . Video bronchoscopy Bilateral 11/19/2015    Procedure: VIDEO BRONCHOSCOPY WITH FLUORO;  Surgeon: Rigoberto Noel, [place];  Location: [facility];  Service: Cardiopulmonary;  Laterality: Bilateral;  . Cataract extraction Bilateral     Family History  Problem Relation Age of Onset  . Breast cancer Sister   . Stroke  Sister   . Colon cancer Brother   . Cancer Maternal Aunt     x2  . Hypertension Mother   . Stroke Mother   . Breast cancer Cousin   . Diabetes Other   . Hypertension Other   . Hyperlipidemia Other     Social History   Social History  . Marital Status: Married    Spouse Name: N/A  . Number of Children: Y8  . Years of Education: N/A   Occupational History  . retired Administrator    Social History Main Topics  . Smoking status: Former Smoker -- 2.00 packs/day for 40 years    Types: Cigarettes    Quit date: 10/02/1990  . Smokeless tobacco: Never Used     Comment: smoked 1-2 ppd.   . Alcohol Use: No  . Drug Use: No  . Sexual Activity: Not Asked   Other  Topics Concern  . None   Social History Narrative   Originally from [place]. Always lived in [place]. Previously has traveled to [place], [place], [place], [place], [place], [place], [place], & [place]. Previously worked driving garbage truck. No pets currently. No known mold exposure.       Objective:   Physical Exam BP 106/52 mmHg  Pulse 82  Ht '6\' 1"'$  (1.854 m)  Wt 246 lb (111.585 kg)  BMI 32.46 kg/m2  SpO2 94% General:  Awake. Alert. No distress. Integument:  Warm & dry. No rash on exposed skin.  Lymphatics:  No appreciated cervical or supraclavicular lymphadenoapthy. HEENT:  Moist mucus membranes. No oral ulcers. No scleral injection.  Cardiovascular:  Regular rate. No edema. No appreciable JVD.  Pulmonary:  Clear bilaterally to auscultation. Normal work of breathing on room air. Speaking in complete sentences. Neurological:  CN 2-12 grossly in tact. No meningismus. Moving all 4 extremities equally. Symmetric brachioradialis deep tendon reflexes.  IMAGING MRI BRAIN W/ & W/O 12/17/15 (per radiologist):  No intracranial abnormality to suggest metastasis.   CT CHEST W/ 11/19/15 (previously reviewed by me): Subcarinal lymph node as well as right hilar lymph node measuring over 1 cm in short axis. Enlarging bilateral parenchymal nodules as well as persistent medial left lower lobe consolidation. No pleural effusion or thickening. No pericardial effusion.  CT CHEST W/ 10/06/15 (previously reviewed by me): Medial left lower lobe consolidation with air bronchograms and some groundglass component. Satellite nodules noted within left lower lobe, lingula, and left upper lobe. Right upper lobe nodules with largest measuring approximately 1.3 cm. No pericardial effusion. No pleural effusion or thickening. No pathologic mediastinal adenopathy.  CXR PA/LAT 09/09/15 (previously reviewed by me): Left hilar opacity. No pleural effusion or thickening appreciated. Heart normal in size. Mediastinum normal in contour.  PATHOLOGY  Bronchial Wash LLL  11/19/15:  Malignant cells consistent with non-small cell carcinoma.  Bronchial Wash RUL 11/19/15:  Atypical cells present. Bronchial Brush LLL 11/19/15: Malignant cells consistent with non-small cell carcinoma.  Endobronchial FNA/TBBx 11/19/15:  Adenocarcionma  MICROBIOLOGY BAL 11/19/15:  Oral Flora / AFB negative / Fungal negative  LABS 11/24/15 CBC:  5.9/13.1/38.2/170 BMP:  140/4.1/106/28/11/1.15/113/9.1 Magnesium:  2.1  09/15/15 CBC: 4.6/14.6/42.8/151 BMP: 139/3.8/107/25/17/1.18/119/8.8 LFT: 3.7/6.3/0.6/75/16/13 LDH: 131    Assessment & Plan:  80 year old male with newly Stage IV adenocarcinoma of the left lung. Currently undergoing chemotherapy. Patient continues to have ongoing issues with dyspnea and intermittent cough productive of phlegm. I suspect the patient has COPD with his long history of tobacco use. He may benefit from inhaler medications by at least it would be  more prudent to establish the diagnosis of COPD before initiating any medication. I instructed the patient contact my office if he had any new questions or breathing problems before his next appointment.  1. Dyspnea: Suspect COPD. Checking full pulmonary function testing and 6 minute walk test on room air on her before next appointment. 2. Stage IV NSCLC: Follows with medical oncology. Currently tolerating chemotherapy. 3. Follow-up: Patient to return to clinic in 2-3 months or sooner if needed.  Sonia Baller Ashok Cordia, M.D. [facility] Pager:  [phone] After 3pm or if no response, call [phone] 9:15 AM 02/04/2016

## 2016-02-09 ENCOUNTER — Ambulatory Visit (HOSPITAL_BASED_OUTPATIENT_CLINIC_OR_DEPARTMENT_OTHER): Payer: Medicare Other | Admitting: Internal Medicine

## 2016-02-09 ENCOUNTER — Telehealth: Payer: Self-pay | Admitting: Internal Medicine

## 2016-02-09 ENCOUNTER — Ambulatory Visit (HOSPITAL_BASED_OUTPATIENT_CLINIC_OR_DEPARTMENT_OTHER): Payer: Medicare Other

## 2016-02-09 ENCOUNTER — Encounter: Payer: Self-pay | Admitting: Internal Medicine

## 2016-02-09 ENCOUNTER — Other Ambulatory Visit (HOSPITAL_BASED_OUTPATIENT_CLINIC_OR_DEPARTMENT_OTHER): Payer: Medicare Other

## 2016-02-09 VITALS — BP 128/75 | HR 80 | Temp 98.4°F | Resp 18 | Ht 73.0 in | Wt 243.3 lb

## 2016-02-09 DIAGNOSIS — C3432 Malignant neoplasm of lower lobe, left bronchus or lung: Secondary | ICD-10-CM | POA: Diagnosis not present

## 2016-02-09 DIAGNOSIS — R530 Neoplastic (malignant) related fatigue: Secondary | ICD-10-CM

## 2016-02-09 DIAGNOSIS — Z5111 Encounter for antineoplastic chemotherapy: Secondary | ICD-10-CM

## 2016-02-09 DIAGNOSIS — C3490 Malignant neoplasm of unspecified part of unspecified bronchus or lung: Secondary | ICD-10-CM

## 2016-02-09 LAB — COMPREHENSIVE METABOLIC PANEL
ALT: 17 U/L (ref 0–55)
AST: 16 U/L (ref 5–34)
Albumin: 3.6 g/dL (ref 3.5–5.0)
Alkaline Phosphatase: 69 U/L (ref 40–150)
Anion Gap: 9 mEq/L (ref 3–11)
BILIRUBIN TOTAL: 0.35 mg/dL (ref 0.20–1.20)
BUN: 16.7 mg/dL (ref 7.0–26.0)
CHLORIDE: 110 meq/L — AB (ref 98–109)
CO2: 21 meq/L — AB (ref 22–29)
CREATININE: 1.2 mg/dL (ref 0.7–1.3)
Calcium: 9.9 mg/dL (ref 8.4–10.4)
EGFR: 70 mL/min/{1.73_m2} — ABNORMAL LOW (ref >90)
GLUCOSE: 162 mg/dL — AB (ref 70–140)
Potassium: 4 mEq/L (ref 3.5–5.1)
SODIUM: 139 meq/L (ref 136–145)
TOTAL PROTEIN: 7.1 g/dL (ref 6.4–8.3)

## 2016-02-09 LAB — CBC WITH DIFFERENTIAL/PLATELET
BASO%: 0 % (ref 0.0–2.0)
Basophils Absolute: 0 10*3/uL (ref 0.0–0.1)
EOS%: 0 % (ref 0.0–7.0)
Eosinophils Absolute: 0 10*3/uL (ref 0.0–0.5)
HCT: 39.9 % (ref 38.4–49.9)
HGB: 14.1 g/dL (ref 13.0–17.1)
LYMPH%: 8 % — AB (ref 14.0–49.0)
MCH: 31.3 pg (ref 27.2–33.4)
MCHC: 35.3 g/dL (ref 32.0–36.0)
MCV: 88.7 fL (ref 79.3–98.0)
MONO#: 0.6 10*3/uL (ref 0.1–0.9)
MONO%: 6.9 % (ref 0.0–14.0)
NEUT%: 85.1 % — AB (ref 39.0–75.0)
NEUTROS ABS: 6.9 10*3/uL — AB (ref 1.5–6.5)
PLATELETS: 193 10*3/uL (ref 140–400)
RBC: 4.5 10*6/uL (ref 4.20–5.82)
RDW: 14.4 % (ref 11.0–14.6)
WBC: 8.1 10*3/uL (ref 4.0–10.3)
lymph#: 0.7 10*3/uL — ABNORMAL LOW (ref 0.9–3.3)

## 2016-02-09 MED ORDER — CYANOCOBALAMIN 1000 MCG/ML IJ SOLN
1000.0000 ug | Freq: Once | INTRAMUSCULAR | Status: AC
  Administered 2016-02-09: 1000 ug via INTRAMUSCULAR

## 2016-02-09 MED ORDER — PALONOSETRON HCL INJECTION 0.25 MG/5ML
0.2500 mg | Freq: Once | INTRAVENOUS | Status: AC
  Administered 2016-02-09: 0.25 mg via INTRAVENOUS

## 2016-02-09 MED ORDER — SODIUM CHLORIDE 0.9 % IV SOLN
Freq: Once | INTRAVENOUS | Status: AC
  Administered 2016-02-09: 15:00:00 via INTRAVENOUS

## 2016-02-09 MED ORDER — PALONOSETRON HCL INJECTION 0.25 MG/5ML
INTRAVENOUS | Status: AC
  Filled 2016-02-09: qty 5

## 2016-02-09 MED ORDER — SODIUM CHLORIDE 0.9 % IV SOLN
500.0000 mg/m2 | Freq: Once | INTRAVENOUS | Status: AC
  Administered 2016-02-09: 1200 mg via INTRAVENOUS
  Filled 2016-02-09: qty 48

## 2016-02-09 MED ORDER — SODIUM CHLORIDE 0.9 % IV SOLN
10.0000 mg | Freq: Once | INTRAVENOUS | Status: AC
  Administered 2016-02-09: 10 mg via INTRAVENOUS
  Filled 2016-02-09: qty 1

## 2016-02-09 MED ORDER — CYANOCOBALAMIN 1000 MCG/ML IJ SOLN
INTRAMUSCULAR | Status: AC
  Filled 2016-02-09: qty 1

## 2016-02-09 MED ORDER — SODIUM CHLORIDE 0.9 % IV SOLN
568.0000 mg | Freq: Once | INTRAVENOUS | Status: AC
  Administered 2016-02-09: 570 mg via INTRAVENOUS
  Filled 2016-02-09: qty 57

## 2016-02-09 NOTE — Telephone Encounter (Signed)
Gave pt apt & avs °

## 2016-02-09 NOTE — Progress Notes (Signed)
[facility] [phone]   [phone]  OFFICE PROGRESS NOTE  [name], MD [address] [place] [address]  DIAGNOSIS: Stage IV (T3, N2, M1b) non-small cell lung cancer, adenocarcinoma with negative EGFR mutation, negative ALK gene translocation, negative ROS 1, presented with large left lower lobe consolidative mass in addition to bilateral pulmonary nodules, right hilar and subcarinal lymphadenopathy as well as suspicious bony lesion diagnosed in February 2017.  PRIOR THERAPY: None.  CURRENT THERAPY: Systemic chemotherapy with carboplatin for AUC of 5 and Alimta 500 MG/M2 every 3 weeks. First dose 12/29/2015. Status post 2 cycles.  INTERVAL HISTORY: Matthew Berger 80 y.o. male returns to the clinic today for follow-up visit accompanied by his wife. The patient tolerated the second cycle of his systemic chemotherapy with carboplatin and Alimta fairly well except for mild fatigue and nausea a few days after the treatment. He denied having any significant fever or chills. He has no chest pain, shortness of breath, cough or hemoptysis. He lost 3 pounds last week. He is here today for evaluation before starting cycle #3.  MEDICAL HISTORY: Past Medical History  Diagnosis Date  . Hypertension   . Hypercholesteremia   . Atrial fibrillation ([place])   . Pneumonia 11/18/2015  . GERD (gastroesophageal reflux disease)   . Non-small cell lung cancer (NSCLC) ([place]) 11/19/15  . Encounter for antineoplastic chemotherapy 01/05/2016    ALLERGIES:  has No Known Allergies.  MEDICATIONS:  Current Outpatient Prescriptions  Medication Sig Dispense Refill  . aspirin 81 MG tablet Take 81 mg by mouth daily.    Marland Kitchen atorvastatin (LIPITOR) 20 MG tablet Take 20 mg by mouth daily.    . Brinzolamide-Brimonidine (SIMBRINZA) 1-0.2 % SUSP Apply 2 drops to eye 2 (two) times daily.    Marland Kitchen dexamethasone (DECADRON) 4 MG tablet 4 mg by mouth twice a day the day before, day of and  day after the chemotherapy every 3 weeks 40 tablet 1  . diltiazem (DILACOR XR) 180 MG 24 hr capsule Take 180 mg by mouth daily.    . dorzolamide (TRUSOPT) 2 % ophthalmic solution Place 1 drop into the left eye 2 (two) times daily.    . folic acid (FOLVITE) 1 MG tablet Take 1 tablet (1 mg total) by mouth daily. 30 tablet 4  . latanoprost (XALATAN) 0.005 % ophthalmic solution Place 1 drop into both eyes at bedtime.    . metoprolol tartrate (LOPRESSOR) 25 MG tablet Take 25 mg by mouth 2 (two) times daily.    . Multiple Vitamins-Minerals (MULTIVITAMIN WITH MINERALS) tablet Take 1 tablet by mouth daily.    . prochlorperazine (COMPAZINE) 10 MG tablet Take 1 tablet (10 mg total) by mouth every 6 (six) hours as needed for nausea or vomiting. 30 tablet 0  . psyllium (METAMUCIL) 58.6 % powder Take 1 packet by mouth daily.    . tamsulosin (FLOMAX) 0.4 MG CAPS capsule Take 0.4 mg by mouth daily.     No current facility-administered medications for this visit.    SURGICAL HISTORY:  Past Surgical History  Procedure Laterality Date  . Wisdom tooth extraction    . Rotator cuff repair Right   . Video bronchoscopy Bilateral 11/19/2015    Procedure: VIDEO BRONCHOSCOPY WITH FLUORO;  Surgeon: Rigoberto Noel, [place];  Location: [facility];  Service: Cardiopulmonary;  Laterality: Bilateral;  . Cataract extraction Bilateral     REVIEW OF SYSTEMS:  A comprehensive review of systems was negative.   PHYSICAL  EXAMINATION: General appearance: alert, cooperative and no distress Head: Normocephalic, without obvious abnormality, atraumatic Neck: no adenopathy, no JVD, supple, symmetrical, trachea midline and thyroid not enlarged, symmetric, no tenderness/mass/nodules Lymph nodes: Cervical, supraclavicular, and axillary nodes normal. Resp: clear to auscultation bilaterally Back: symmetric, no curvature. ROM normal. No CVA tenderness. Cardio: regular rate and rhythm, S1, S2 normal, no murmur, click, rub or gallop GI:  soft, non-tender; bowel sounds normal; no masses,  no organomegaly Extremities: extremities normal, atraumatic, no cyanosis or edema Neurologic: Alert and oriented X 3, normal strength and tone. Normal symmetric reflexes. Normal coordination and gait  ECOG PERFORMANCE STATUS: 1 - Symptomatic but completely ambulatory  Blood pressure 128/75, pulse 80, temperature 98.4 F (36.9 C), temperature source Oral, resp. rate 18, height '6\' 1"'$  (1.854 m), weight 243 lb 4.8 oz (110.36 kg), SpO2 95 %.  LABORATORY DATA: Lab Results  Component Value Date   WBC 8.1 02/09/2016   HGB 14.1 02/09/2016   HCT 39.9 02/09/2016   MCV 88.7 02/09/2016   PLT 193 02/09/2016      Chemistry      Component Value Date/Time   NA 142 02/02/2016 0934   NA 140 11/24/2015 0453   K 4.0 02/02/2016 0934   K 4.1 11/24/2015 0453   CL 106 11/24/2015 0453   CO2 23 02/02/2016 0934   CO2 28 11/24/2015 0453   BUN 12.2 02/02/2016 0934   BUN 11 11/24/2015 0453   CREATININE 1.1 02/02/2016 0934   CREATININE 1.15 11/24/2015 0453      Component Value Date/Time   CALCIUM 8.8 02/02/2016 0934   CALCIUM 9.1 11/24/2015 0453   ALKPHOS 57 02/02/2016 0934   ALKPHOS 77 11/17/2015 1143   AST 19 02/02/2016 0934   AST 32 11/17/2015 1143   ALT 18 02/02/2016 0934   ALT 19 11/17/2015 1143   BILITOT 0.56 02/02/2016 0934   BILITOT 0.5 11/17/2015 1143       RADIOGRAPHIC STUDIES: No results found.  ASSESSMENT AND PLAN: This is a very pleasant 80 years old African-American male with metastatic non-small cell lung cancer, adenocarcinoma. The molecular study showed no actionable mutations. The patient is currently undergoing systemic chemotherapy with carboplatin and Alimta status post 2 cycles. He tolerated the first week of his treatment fairly well with no significant complaints except for mild fatigue the first week after his treatment. I recommended for the patient to proceed with cycle #3 today as a scheduled. I will see him back  for follow-up visit in 3 weeks for evaluation before starting cycle #4 after repeating CT scan of the chest, abdomen and pelvis for restaging of his disease. He was advised to call immediately if he has any concerning symptoms in the interval. The patient voices understanding of current disease status and treatment options and is in agreement with the current care plan.  All questions were answered. The patient knows to call the clinic with any problems, questions or concerns. We can certainly see the patient much sooner if necessary.  Disclaimer: This note was dictated with voice recognition software. Similar sounding words can inadvertently be transcribed and may not be corrected upon review.

## 2016-02-09 NOTE — Patient Instructions (Signed)
[facility] Discharge Instructions for Patients Receiving Chemotherapy  Today you received the following chemotherapy agents :  Alimta, Carboplatin.  To help prevent nausea and vomiting after your treatment, we encourage you to take your nausea medication as prescribed.   If you develop nausea and vomiting that is not controlled by your nausea medication, call the clinic.   BELOW ARE SYMPTOMS THAT SHOULD BE REPORTED IMMEDIATELY:  *FEVER GREATER THAN 100.5 F  *CHILLS WITH OR WITHOUT FEVER  NAUSEA AND VOMITING THAT IS NOT CONTROLLED WITH YOUR NAUSEA MEDICATION  *UNUSUAL SHORTNESS OF BREATH  *UNUSUAL BRUISING OR BLEEDING  TENDERNESS IN MOUTH AND THROAT WITH OR WITHOUT PRESENCE OF ULCERS  *URINARY PROBLEMS  *BOWEL PROBLEMS  UNUSUAL RASH Items with * indicate a potential emergency and should be followed up as soon as possible.  Feel free to call the clinic you have any questions or concerns. The clinic phone number is [phone].  Please show the CHEMO ALERT CARD at check-in to the Emergency Department and triage nurse.   

## 2016-02-15 ENCOUNTER — Other Ambulatory Visit: Payer: Self-pay | Admitting: *Deleted

## 2016-02-15 MED ORDER — FOLIC ACID 1 MG PO TABS: 1.0000 mg | ORAL_TABLET | Freq: Every day | ORAL | Status: DC

## 2016-02-16 ENCOUNTER — Other Ambulatory Visit (HOSPITAL_BASED_OUTPATIENT_CLINIC_OR_DEPARTMENT_OTHER): Payer: Medicare Other

## 2016-02-16 DIAGNOSIS — C3432 Malignant neoplasm of lower lobe, left bronchus or lung: Secondary | ICD-10-CM | POA: Diagnosis not present

## 2016-02-16 DIAGNOSIS — C3490 Malignant neoplasm of unspecified part of unspecified bronchus or lung: Secondary | ICD-10-CM

## 2016-02-16 LAB — CBC WITH DIFFERENTIAL/PLATELET
BASO%: 0.2 % (ref 0.0–2.0)
BASOS ABS: 0 10*3/uL (ref 0.0–0.1)
EOS ABS: 0 10*3/uL (ref 0.0–0.5)
EOS%: 0.9 % (ref 0.0–7.0)
HCT: 42.7 % (ref 38.4–49.9)
HGB: 13.9 g/dL (ref 13.0–17.1)
LYMPH%: 30.3 % (ref 14.0–49.0)
MCH: 30.4 pg (ref 27.2–33.4)
MCHC: 32.7 g/dL (ref 32.0–36.0)
MCV: 92.9 fL (ref 79.3–98.0)
MONO#: 0.1 10*3/uL (ref 0.1–0.9)
MONO%: 5.3 % (ref 0.0–14.0)
NEUT#: 1.7 10*3/uL (ref 1.5–6.5)
NEUT%: 63.3 % (ref 39.0–75.0)
Platelets: 132 10*3/uL — ABNORMAL LOW (ref 140–400)
RBC: 4.59 10*6/uL (ref 4.20–5.82)
RDW: 14.6 % (ref 11.0–14.6)
WBC: 2.7 10*3/uL — AB (ref 4.0–10.3)
lymph#: 0.8 10*3/uL — ABNORMAL LOW (ref 0.9–3.3)

## 2016-02-16 LAB — COMPREHENSIVE METABOLIC PANEL
ALT: 18 U/L (ref 0–55)
AST: 17 U/L (ref 5–34)
Albumin: 3.4 g/dL — ABNORMAL LOW (ref 3.5–5.0)
Alkaline Phosphatase: 60 U/L (ref 40–150)
Anion Gap: 7 mEq/L (ref 3–11)
BUN: 21.2 mg/dL (ref 7.0–26.0)
CO2: 27 meq/L (ref 22–29)
Calcium: 9.1 mg/dL (ref 8.4–10.4)
Chloride: 102 mEq/L (ref 98–109)
Creatinine: 1.3 mg/dL (ref 0.7–1.3)
EGFR: 63 mL/min/{1.73_m2} — AB (ref >90)
GLUCOSE: 150 mg/dL — AB (ref 70–140)
POTASSIUM: 4.1 meq/L (ref 3.5–5.1)
SODIUM: 136 meq/L (ref 136–145)
Total Bilirubin: 1.16 mg/dL (ref 0.20–1.20)
Total Protein: 6.6 g/dL (ref 6.4–8.3)

## 2016-02-22 ENCOUNTER — Other Ambulatory Visit: Payer: Self-pay | Admitting: Medical Oncology

## 2016-02-22 ENCOUNTER — Other Ambulatory Visit: Payer: Self-pay | Admitting: *Deleted

## 2016-02-22 DIAGNOSIS — C3490 Malignant neoplasm of unspecified part of unspecified bronchus or lung: Secondary | ICD-10-CM

## 2016-02-22 MED ORDER — FOLIC ACID 1 MG PO TABS: 1.0000 mg | ORAL_TABLET | Freq: Every day | ORAL | Status: DC

## 2016-02-23 ENCOUNTER — Encounter (HOSPITAL_COMMUNITY): Payer: Self-pay

## 2016-02-23 ENCOUNTER — Ambulatory Visit (HOSPITAL_COMMUNITY)
Admission: RE | Admit: 2016-02-23 | Discharge: 2016-02-23 | Disposition: A | Discharge status: Home / Self Care | Payer: Medicare Other | Source: Ambulatory Visit | Attending: Internal Medicine | Admitting: Internal Medicine

## 2016-02-23 ENCOUNTER — Other Ambulatory Visit (HOSPITAL_BASED_OUTPATIENT_CLINIC_OR_DEPARTMENT_OTHER): Payer: Medicare Other

## 2016-02-23 DIAGNOSIS — C3432 Malignant neoplasm of lower lobe, left bronchus or lung: Secondary | ICD-10-CM | POA: Diagnosis not present

## 2016-02-23 DIAGNOSIS — C7800 Secondary malignant neoplasm of unspecified lung: Secondary | ICD-10-CM | POA: Insufficient documentation

## 2016-02-23 DIAGNOSIS — C3490 Malignant neoplasm of unspecified part of unspecified bronchus or lung: Secondary | ICD-10-CM | POA: Diagnosis not present

## 2016-02-23 DIAGNOSIS — R937 Abnormal findings on diagnostic imaging of other parts of musculoskeletal system: Secondary | ICD-10-CM | POA: Diagnosis not present

## 2016-02-23 DIAGNOSIS — Z5111 Encounter for antineoplastic chemotherapy: Secondary | ICD-10-CM

## 2016-02-23 DIAGNOSIS — N400 Enlarged prostate without lower urinary tract symptoms: Secondary | ICD-10-CM | POA: Insufficient documentation

## 2016-02-23 LAB — CBC WITH DIFFERENTIAL/PLATELET
BASO%: 0 % (ref 0.0–2.0)
BASOS ABS: 0 10*3/uL (ref 0.0–0.1)
EOS ABS: 0.1 10*3/uL (ref 0.0–0.5)
EOS%: 2.7 % (ref 0.0–7.0)
HEMATOCRIT: 37.5 % — AB (ref 38.4–49.9)
HEMOGLOBIN: 13.2 g/dL (ref 13.0–17.1)
LYMPH#: 0.7 10*3/uL — AB (ref 0.9–3.3)
LYMPH%: 26.9 % (ref 14.0–49.0)
MCH: 31.2 pg (ref 27.2–33.4)
MCHC: 35.2 g/dL (ref 32.0–36.0)
MCV: 88.7 fL (ref 79.3–98.0)
MONO#: 0.5 10*3/uL (ref 0.1–0.9)
MONO%: 18.5 % — ABNORMAL HIGH (ref 0.0–14.0)
NEUT#: 1.4 10*3/uL — ABNORMAL LOW (ref 1.5–6.5)
NEUT%: 51.9 % (ref 39.0–75.0)
PLATELETS: 65 10*3/uL — AB (ref 140–400)
RBC: 4.23 10*6/uL (ref 4.20–5.82)
RDW: 14.5 % (ref 11.0–14.6)
WBC: 2.6 10*3/uL — ABNORMAL LOW (ref 4.0–10.3)
nRBC: 0 % (ref 0–0)

## 2016-02-23 LAB — COMPREHENSIVE METABOLIC PANEL
ALT: 22 U/L (ref 0–55)
ANION GAP: 9 meq/L (ref 3–11)
AST: 22 U/L (ref 5–34)
Albumin: 3.4 g/dL — ABNORMAL LOW (ref 3.5–5.0)
Alkaline Phosphatase: 64 U/L (ref 40–150)
BILIRUBIN TOTAL: 0.62 mg/dL (ref 0.20–1.20)
BUN: 10.5 mg/dL (ref 7.0–26.0)
CO2: 25 meq/L (ref 22–29)
CREATININE: 1.1 mg/dL (ref 0.7–1.3)
Calcium: 9.3 mg/dL (ref 8.4–10.4)
Chloride: 106 mEq/L (ref 98–109)
EGFR: 72 mL/min/{1.73_m2} — ABNORMAL LOW (ref >90)
Glucose: 108 mg/dl (ref 70–140)
Potassium: 3.9 mEq/L (ref 3.5–5.1)
Sodium: 140 mEq/L (ref 136–145)
TOTAL PROTEIN: 6.6 g/dL (ref 6.4–8.3)

## 2016-02-23 MED ORDER — IOPAMIDOL (ISOVUE-300) INJECTION 61%
100.0000 mL | Freq: Once | INTRAVENOUS | Status: AC | PRN
  Administered 2016-02-23: 100 mL via INTRAVENOUS

## 2016-03-01 ENCOUNTER — Telehealth: Payer: Self-pay | Admitting: *Deleted

## 2016-03-01 ENCOUNTER — Ambulatory Visit: Payer: Medicare Other

## 2016-03-01 ENCOUNTER — Encounter: Payer: Self-pay | Admitting: *Deleted

## 2016-03-01 ENCOUNTER — Encounter: Payer: Self-pay | Admitting: Internal Medicine

## 2016-03-01 ENCOUNTER — Ambulatory Visit (HOSPITAL_BASED_OUTPATIENT_CLINIC_OR_DEPARTMENT_OTHER): Payer: Medicare Other | Admitting: Internal Medicine

## 2016-03-01 ENCOUNTER — Telehealth: Payer: Self-pay | Admitting: Internal Medicine

## 2016-03-01 ENCOUNTER — Other Ambulatory Visit (HOSPITAL_BASED_OUTPATIENT_CLINIC_OR_DEPARTMENT_OTHER): Payer: Medicare Other

## 2016-03-01 VITALS — BP 119/62 | HR 92 | Temp 98.6°F | Resp 19 | Ht 73.0 in | Wt 243.9 lb

## 2016-03-01 DIAGNOSIS — C3490 Malignant neoplasm of unspecified part of unspecified bronchus or lung: Secondary | ICD-10-CM

## 2016-03-01 DIAGNOSIS — R530 Neoplastic (malignant) related fatigue: Secondary | ICD-10-CM | POA: Diagnosis not present

## 2016-03-01 DIAGNOSIS — C3432 Malignant neoplasm of lower lobe, left bronchus or lung: Secondary | ICD-10-CM

## 2016-03-01 DIAGNOSIS — Z5111 Encounter for antineoplastic chemotherapy: Secondary | ICD-10-CM

## 2016-03-01 LAB — CBC WITH DIFFERENTIAL/PLATELET
BASO%: 0.7 % (ref 0.0–2.0)
BASOS ABS: 0 10*3/uL (ref 0.0–0.1)
EOS%: 4.8 % (ref 0.0–7.0)
Eosinophils Absolute: 0.1 10*3/uL (ref 0.0–0.5)
HEMATOCRIT: 39.7 % (ref 38.4–49.9)
HGB: 13.2 g/dL (ref 13.0–17.1)
LYMPH#: 0.8 10*3/uL — AB (ref 0.9–3.3)
LYMPH%: 32.9 % (ref 14.0–49.0)
MCH: 31 pg (ref 27.2–33.4)
MCHC: 33.3 g/dL (ref 32.0–36.0)
MCV: 93.2 fL (ref 79.3–98.0)
MONO#: 0.4 10*3/uL (ref 0.1–0.9)
MONO%: 17.5 % — AB (ref 0.0–14.0)
NEUT#: 1 10*3/uL — ABNORMAL LOW (ref 1.5–6.5)
NEUT%: 44.1 % (ref 39.0–75.0)
Platelets: 207 10*3/uL (ref 140–400)
RBC: 4.26 10*6/uL (ref 4.20–5.82)
RDW: 15.6 % — ABNORMAL HIGH (ref 11.0–14.6)
WBC: 2.3 10*3/uL — ABNORMAL LOW (ref 4.0–10.3)

## 2016-03-01 LAB — COMPREHENSIVE METABOLIC PANEL
ALT: 17 U/L (ref 0–55)
ANION GAP: 9 meq/L (ref 3–11)
AST: 17 U/L (ref 5–34)
Albumin: 3.3 g/dL — ABNORMAL LOW (ref 3.5–5.0)
Alkaline Phosphatase: 58 U/L (ref 40–150)
BUN: 13.1 mg/dL (ref 7.0–26.0)
CALCIUM: 9.1 mg/dL (ref 8.4–10.4)
CHLORIDE: 111 meq/L — AB (ref 98–109)
CO2: 22 mEq/L (ref 22–29)
CREATININE: 1.2 mg/dL (ref 0.7–1.3)
EGFR: 66 mL/min/{1.73_m2} — AB (ref >90)
Glucose: 159 mg/dl — ABNORMAL HIGH (ref 70–140)
POTASSIUM: 3.8 meq/L (ref 3.5–5.1)
SODIUM: 141 meq/L (ref 136–145)
Total Bilirubin: 0.45 mg/dL (ref 0.20–1.20)
Total Protein: 6.5 g/dL (ref 6.4–8.3)

## 2016-03-01 NOTE — Progress Notes (Signed)
[facility] [phone]) [phone]   Fax:([phone]  OFFICE PROGRESS NOTE  Matthew Panda, MD [address] [facility]  DIAGNOSIS: Stage IV (T3, N2, M1b) non-small cell lung cancer, adenocarcinoma with negative EGFR mutation, negative ALK gene translocation, negative ROS 1, presented with large left lower lobe consolidative mass in addition to bilateral pulmonary nodules, right hilar and subcarinal lymphadenopathy as well as suspicious bony lesion diagnosed in February 2017.  PRIOR THERAPY: None.  CURRENT THERAPY: Systemic chemotherapy with carboplatin for AUC of 5 and Alimta 500 MG/M2 every 3 weeks. First dose 12/29/2015. Status post 3 cycles.  INTERVAL HISTORY: [facility] 80 y.o. male returns to the clinic today for follow-up visit accompanied by his wife, son and daughter. The patient tolerated the third cycle of his systemic chemotherapy with carboplatin and Alimta fairly well except for mild fatigue. He denied having any significant fever or chills. He has no chest pain, shortness of breath except with exertion, cough or hemoptysis. He had repeat CT scan of the chest, abdomen and pelvis performed recently and he is here for evaluation and discussion of his scan results.  MEDICAL HISTORY: Past Medical History  Diagnosis Date  . Hypertension   . Hypercholesteremia   . Atrial fibrillation ([facility])   . Pneumonia 11/18/2015  . GERD (gastroesophageal reflux disease)   . Non-small cell lung cancer (NSCLC) ([facility]) 11/19/15  . Encounter for antineoplastic chemotherapy 01/05/2016    ALLERGIES:  has No Known Allergies.  MEDICATIONS:  Current Outpatient Prescriptions  Medication Sig Dispense Refill  . aspirin 81 MG tablet Take 81 mg by mouth daily.    Marland Kitchen atorvastatin (LIPITOR) 20 MG tablet Take 20 mg by mouth daily.    . Brinzolamide-Brimonidine (SIMBRINZA) 1-0.2 % SUSP Apply 2 drops to eye 2 (two) times daily.    Marland Kitchen dexamethasone (DECADRON) 4 MG tablet 4  mg by mouth twice a day the day before, day of and day after the chemotherapy every 3 weeks 40 tablet 1  . diltiazem (DILACOR XR) 180 MG 24 hr capsule Take 180 mg by mouth daily.    . dorzolamide (TRUSOPT) 2 % ophthalmic solution Place 1 drop into the left eye 2 (two) times daily.    Marland Kitchen ELIQUIS 5 MG TABS tablet Take 5 mg by mouth 2 (two) times daily.  1  . folic acid (FOLVITE) 1 MG tablet Take 1 tablet (1 mg total) by mouth daily. 90 tablet 0  . latanoprost (XALATAN) 0.005 % ophthalmic solution Place 1 drop into both eyes at bedtime.    . metoprolol tartrate (LOPRESSOR) 25 MG tablet Take 25 mg by mouth 2 (two) times daily.    . Multiple Vitamins-Minerals (MULTIVITAMIN WITH MINERALS) tablet Take 1 tablet by mouth daily.    . prochlorperazine (COMPAZINE) 10 MG tablet Take 1 tablet (10 mg total) by mouth every 6 (six) hours as needed for nausea or vomiting. 30 tablet 0  . psyllium (METAMUCIL) 58.6 % powder Take 1 packet by mouth daily.    . tamsulosin (FLOMAX) 0.4 MG CAPS capsule Take 0.4 mg by mouth daily.     No current facility-administered medications for this visit.    SURGICAL HISTORY:  Past Surgical History  Procedure Laterality Date  . Wisdom tooth extraction    . Rotator cuff repair Right   . Video bronchoscopy Bilateral 11/19/2015    Procedure: VIDEO BRONCHOSCOPY WITH FLUORO;  Surgeon: Matthew Noel, MD;  Location: [facility];  Service: Cardiopulmonary;  Laterality: Bilateral;  . Cataract extraction Bilateral     REVIEW OF SYSTEMS:  Constitutional: positive for fatigue Eyes: negative Ears, nose, mouth, throat, and face: negative Respiratory: positive for cough and dyspnea on exertion Cardiovascular: negative Gastrointestinal: negative Genitourinary:negative Integument/breast: negative Hematologic/lymphatic: negative Musculoskeletal:negative Neurological: negative Behavioral/Psych: negative Endocrine: negative Allergic/Immunologic: negative   PHYSICAL EXAMINATION:  General appearance: alert, cooperative and no distress Head: Normocephalic, without obvious abnormality, atraumatic Neck: no adenopathy, no JVD, supple, symmetrical, trachea midline and thyroid not enlarged, symmetric, no tenderness/mass/nodules Lymph nodes: Cervical, supraclavicular, and axillary nodes normal. Resp: clear to auscultation bilaterally Back: symmetric, no curvature. ROM normal. No CVA tenderness. Cardio: regular rate and rhythm, S1, S2 normal, no murmur, click, rub or gallop GI: soft, non-tender; bowel sounds normal; no masses,  no organomegaly Extremities: extremities normal, atraumatic, no cyanosis or edema Neurologic: Alert and oriented X 3, normal strength and tone. Normal symmetric reflexes. Normal coordination and gait  ECOG PERFORMANCE STATUS: 1 - Symptomatic but completely ambulatory  Blood pressure 119/62, pulse 92, temperature 98.6 F (37 C), temperature source Oral, resp. rate 19, height '6\' 1"'$  (1.854 m), weight 243 lb 14.4 oz (110.632 kg), SpO2 97 %.  LABORATORY DATA: Lab Results  Component Value Date   WBC 2.3* 03/01/2016   HGB 13.2 03/01/2016   HCT 39.7 03/01/2016   MCV 93.2 03/01/2016   PLT 207 03/01/2016      Chemistry      Component Value Date/Time   NA 140 02/23/2016 0939   NA 140 11/24/2015 0453   K 3.9 02/23/2016 0939   K 4.1 11/24/2015 0453   CL 106 11/24/2015 0453   CO2 25 02/23/2016 0939   CO2 28 11/24/2015 0453   BUN 10.5 02/23/2016 0939   BUN 11 11/24/2015 0453   CREATININE 1.1 02/23/2016 0939   CREATININE 1.15 11/24/2015 0453      Component Value Date/Time   CALCIUM 9.3 02/23/2016 0939   CALCIUM 9.1 11/24/2015 0453   ALKPHOS 64 02/23/2016 0939   ALKPHOS 77 11/17/2015 1143   AST 22 02/23/2016 0939   AST 32 11/17/2015 1143   ALT 22 02/23/2016 0939   ALT 19 11/17/2015 1143   BILITOT 0.62 02/23/2016 0939   BILITOT 0.5 11/17/2015 1143       RADIOGRAPHIC STUDIES: Ct Chest W Contrast  02/23/2016  CLINICAL DATA:  Restaging lung  cancer, ongoing chemotherapy. Chronic cough and shortness of breath. EXAM: CT CHEST, ABDOMEN, AND PELVIS WITH CONTRAST TECHNIQUE: Multidetector CT imaging of the chest, abdomen and pelvis was performed following the standard protocol during bolus administration of intravenous contrast. CONTRAST:  14m ISOVUE-300 IOPAMIDOL (ISOVUE-300) INJECTION 61% COMPARISON:  PET 12/10/2015 and CT chest 11/19/2015. FINDINGS: CT CHEST FINDINGS Mediastinum/Lymph Nodes: No pathologically enlarged mediastinal, hilar or axillary lymph nodes. Atherosclerotic calcification of the arterial vasculature. Heart size normal. No pericardial effusion. Lungs/Pleura: There are multiple ground-glass and solid pulmonary nodules, seen in a hematogenous distribution. These are largely stable from 11/19/2015. Index left lower lobe nodule measures 1.5 cm (series 4, image 124), stable. Some areas of ill-defined ground-glass in the right upper lobe, seen on 11/19/2015, appear to have resolved in the interval, suggesting an infectious or inflammatory etiology. Mixed ground-glass and consolidation in the superior segment and medial left lower lobe is unchanged. No pleural fluid. Airway is unremarkable. Musculoskeletal: Sclerosis in the T10 vertebral body is unchanged. No additional worrisome lytic or sclerotic lesions. CT ABDOMEN PELVIS FINDINGS Hepatobiliary: Liver and gallbladder are unremarkable. No biliary ductal dilatation. Pancreas: Negative. Spleen:  Negative. Adrenals/Urinary Tract: Adrenal glands are unremarkable. Low-attenuation lesions in the kidneys measure up to 10 mm on the right, likely cysts although definitive characterization is limited without precontrast imaging and due to size. Ureters are decompressed. Bladder is low in volume. Stomach/Bowel: Stomach, small bowel and appendix are unremarkable. A fair amount of stool is seen the colon, indicative of constipation. Vascular/Lymphatic: Atherosclerotic calcification of the arterial  vasculature without abdominal aortic aneurysm. Retro aortic left renal vein. No pathologically enlarged lymph nodes. Reproductive: Prostate is enlarged. Other: No free fluid.  Mesenteries and peritoneum are unremarkable. Musculoskeletal: Sclerosis of the L2 vertebral body, as before. IMPRESSION: 1. Pulmonary metastatic disease is likely stable. Areas of amorphous ground-glass previously seen in the right upper lobe on 11/19/2015 appear to have resolved, favoring an infectious or inflammatory etiology. 2. Stable sclerosis involving the T10 and L2 vertebral bodies. 3. Enlarged prostate. Electronically Signed   By: Lorin Picket M.D.   On: 02/23/2016 11:56   Ct Abdomen Pelvis W Contrast  02/23/2016  CLINICAL DATA:  Restaging lung cancer, ongoing chemotherapy. Chronic cough and shortness of breath. EXAM: CT CHEST, ABDOMEN, AND PELVIS WITH CONTRAST TECHNIQUE: Multidetector CT imaging of the chest, abdomen and pelvis was performed following the standard protocol during bolus administration of intravenous contrast. CONTRAST:  172m ISOVUE-300 IOPAMIDOL (ISOVUE-300) INJECTION 61% COMPARISON:  PET 12/10/2015 and CT chest 11/19/2015. FINDINGS: CT CHEST FINDINGS Mediastinum/Lymph Nodes: No pathologically enlarged mediastinal, hilar or axillary lymph nodes. Atherosclerotic calcification of the arterial vasculature. Heart size normal. No pericardial effusion. Lungs/Pleura: There are multiple ground-glass and solid pulmonary nodules, seen in a hematogenous distribution. These are largely stable from 11/19/2015. Index left lower lobe nodule measures 1.5 cm (series 4, image 124), stable. Some areas of ill-defined ground-glass in the right upper lobe, seen on 11/19/2015, appear to have resolved in the interval, suggesting an infectious or inflammatory etiology. Mixed ground-glass and consolidation in the superior segment and medial left lower lobe is unchanged. No pleural fluid. Airway is unremarkable. Musculoskeletal:  Sclerosis in the T10 vertebral body is unchanged. No additional worrisome lytic or sclerotic lesions. CT ABDOMEN PELVIS FINDINGS Hepatobiliary: Liver and gallbladder are unremarkable. No biliary ductal dilatation. Pancreas: Negative. Spleen: Negative. Adrenals/Urinary Tract: Adrenal glands are unremarkable. Low-attenuation lesions in the kidneys measure up to 10 mm on the right, likely cysts although definitive characterization is limited without precontrast imaging and due to size. Ureters are decompressed. Bladder is low in volume. Stomach/Bowel: Stomach, small bowel and appendix are unremarkable. A fair amount of stool is seen the colon, indicative of constipation. Vascular/Lymphatic: Atherosclerotic calcification of the arterial vasculature without abdominal aortic aneurysm. Retro aortic left renal vein. No pathologically enlarged lymph nodes. Reproductive: Prostate is enlarged. Other: No free fluid.  Mesenteries and peritoneum are unremarkable. Musculoskeletal: Sclerosis of the L2 vertebral body, as before. IMPRESSION: 1. Pulmonary metastatic disease is likely stable. Areas of amorphous ground-glass previously seen in the right upper lobe on 11/19/2015 appear to have resolved, favoring an infectious or inflammatory etiology. 2. Stable sclerosis involving the T10 and L2 vertebral bodies. 3. Enlarged prostate. Electronically Signed   By: MLorin PicketM.D.   On: 02/23/2016 11:56    ASSESSMENT AND PLAN: This is a very pleasant 80years old African-American male with metastatic non-small cell lung cancer, adenocarcinoma. The molecular study showed no actionable mutations. The patient is currently undergoing systemic chemotherapy with carboplatin and Alimta status post 3 cycles. He tolerated the last cycle of his treatment fairly well with no significant complaints except  for mild fatigue. The recent CT scan of the chest, abdomen and pelvis showed no evidence for disease progression. I discussed the scan  results with the patient and his family. I recommended for the patient to proceed with cycle #4 but this will be delayed until next week because of his low absolute neutrophil count. I will see him back for follow-up visit in 4 weeks for evaluation before starting cycle #5. He was advised to call immediately if he has any concerning symptoms in the interval. The patient voices understanding of current disease status and treatment options and is in agreement with the current care plan.  All questions were answered. The patient knows to call the clinic with any problems, questions or concerns. We can certainly see the patient much sooner if necessary.  Disclaimer: This note was dictated with voice recognition software. Similar sounding words can inadvertently be transcribed and may not be corrected upon review.

## 2016-03-01 NOTE — Telephone Encounter (Signed)
Per staff message and POF I have scheduled appts. Advised scheduler of appts and to move labs. JMW  

## 2016-03-01 NOTE — Telephone Encounter (Signed)
per pof to sch pt appt-MW sch trmt-gave pt copy of avs

## 2016-03-08 ENCOUNTER — Other Ambulatory Visit: Payer: Medicare Other

## 2016-03-08 ENCOUNTER — Other Ambulatory Visit (HOSPITAL_BASED_OUTPATIENT_CLINIC_OR_DEPARTMENT_OTHER): Payer: Medicare Other

## 2016-03-08 ENCOUNTER — Ambulatory Visit (HOSPITAL_BASED_OUTPATIENT_CLINIC_OR_DEPARTMENT_OTHER): Payer: Medicare Other

## 2016-03-08 VITALS — BP 125/70 | HR 69 | Temp 98.1°F | Resp 18

## 2016-03-08 DIAGNOSIS — C3432 Malignant neoplasm of lower lobe, left bronchus or lung: Secondary | ICD-10-CM

## 2016-03-08 DIAGNOSIS — C3490 Malignant neoplasm of unspecified part of unspecified bronchus or lung: Secondary | ICD-10-CM

## 2016-03-08 DIAGNOSIS — Z5111 Encounter for antineoplastic chemotherapy: Secondary | ICD-10-CM | POA: Diagnosis not present

## 2016-03-08 LAB — TECHNOLOGIST REVIEW

## 2016-03-08 LAB — COMPREHENSIVE METABOLIC PANEL
ALBUMIN: 3.4 g/dL — AB (ref 3.5–5.0)
ALK PHOS: 65 U/L (ref 40–150)
ALT: 16 U/L (ref 0–55)
ANION GAP: 10 meq/L (ref 3–11)
AST: 15 U/L (ref 5–34)
BILIRUBIN TOTAL: 0.3 mg/dL (ref 0.20–1.20)
BUN: 22.1 mg/dL (ref 7.0–26.0)
CALCIUM: 9.5 mg/dL (ref 8.4–10.4)
CO2: 20 mEq/L — ABNORMAL LOW (ref 22–29)
Chloride: 110 mEq/L — ABNORMAL HIGH (ref 98–109)
Creatinine: 1.3 mg/dL (ref 0.7–1.3)
EGFR: 64 mL/min/{1.73_m2} — AB (ref >90)
GLUCOSE: 184 mg/dL — AB (ref 70–140)
POTASSIUM: 3.9 meq/L (ref 3.5–5.1)
SODIUM: 140 meq/L (ref 136–145)
Total Protein: 6.8 g/dL (ref 6.4–8.3)

## 2016-03-08 LAB — CBC WITH DIFFERENTIAL/PLATELET
BASO%: 0.2 % (ref 0.0–2.0)
BASOS ABS: 0 10*3/uL (ref 0.0–0.1)
EOS ABS: 0 10*3/uL (ref 0.0–0.5)
EOS%: 0 % (ref 0.0–7.0)
HCT: 42.3 % (ref 38.4–49.9)
HEMOGLOBIN: 13.9 g/dL (ref 13.0–17.1)
LYMPH%: 3.4 % — ABNORMAL LOW (ref 14.0–49.0)
MCH: 30.6 pg (ref 27.2–33.4)
MCHC: 32.9 g/dL (ref 32.0–36.0)
MCV: 92.8 fL (ref 79.3–98.0)
MONO#: 0.6 10*3/uL (ref 0.1–0.9)
MONO%: 3.6 % (ref 0.0–14.0)
NEUT#: 15.7 10*3/uL — ABNORMAL HIGH (ref 1.5–6.5)
NEUT%: 92.8 % — ABNORMAL HIGH (ref 39.0–75.0)
PLATELETS: 151 10*3/uL (ref 140–400)
RBC: 4.55 10*6/uL (ref 4.20–5.82)
RDW: 15.9 % — AB (ref 11.0–14.6)
WBC: 16.9 10*3/uL — ABNORMAL HIGH (ref 4.0–10.3)
lymph#: 0.6 10*3/uL — ABNORMAL LOW (ref 0.9–3.3)

## 2016-03-08 MED ORDER — SODIUM CHLORIDE 0.9 % IV SOLN
500.0000 mg/m2 | Freq: Once | INTRAVENOUS | Status: AC
  Administered 2016-03-08: 1200 mg via INTRAVENOUS
  Filled 2016-03-08: qty 40

## 2016-03-08 MED ORDER — SODIUM CHLORIDE 0.9 % IV SOLN
Freq: Once | INTRAVENOUS | Status: AC
  Administered 2016-03-08: 15:00:00 via INTRAVENOUS

## 2016-03-08 MED ORDER — SODIUM CHLORIDE 0.9 % IV SOLN
10.0000 mg | Freq: Once | INTRAVENOUS | Status: AC
  Administered 2016-03-08: 10 mg via INTRAVENOUS
  Filled 2016-03-08: qty 1

## 2016-03-08 MED ORDER — SODIUM CHLORIDE 0.9 % IV SOLN
500.0000 mg | Freq: Once | INTRAVENOUS | Status: AC
  Administered 2016-03-08: 500 mg via INTRAVENOUS
  Filled 2016-03-08: qty 50

## 2016-03-08 MED ORDER — PALONOSETRON HCL INJECTION 0.25 MG/5ML
0.2500 mg | Freq: Once | INTRAVENOUS | Status: AC
  Administered 2016-03-08: 0.25 mg via INTRAVENOUS

## 2016-03-08 MED ORDER — PALONOSETRON HCL INJECTION 0.25 MG/5ML
INTRAVENOUS | Status: AC
Start: 2016-03-08 — End: 2016-03-08
  Filled 2016-03-08: qty 5

## 2016-03-08 NOTE — Patient Instructions (Signed)
[facility] Discharge Instructions for Patients Receiving Chemotherapy  Today you received the following chemotherapy agents Alimta and Carboplatin.  To help prevent nausea and vomiting after your treatment, we encourage you to take your nausea medication as prescribed.   If you develop nausea and vomiting that is not controlled by your nausea medication, call the clinic.   BELOW ARE SYMPTOMS THAT SHOULD BE REPORTED IMMEDIATELY:  *FEVER GREATER THAN 100.5 F  *CHILLS WITH OR WITHOUT FEVER  NAUSEA AND VOMITING THAT IS NOT CONTROLLED WITH YOUR NAUSEA MEDICATION  *UNUSUAL SHORTNESS OF BREATH  *UNUSUAL BRUISING OR BLEEDING  TENDERNESS IN MOUTH AND THROAT WITH OR WITHOUT PRESENCE OF ULCERS  *URINARY PROBLEMS  *BOWEL PROBLEMS  UNUSUAL RASH Items with * indicate a potential emergency and should be followed up as soon as possible.  Feel free to call the clinic you have any questions or concerns. The clinic phone number is [phone].  Please show the CHEMO ALERT CARD at check-in to the Emergency Department and triage nurse.   

## 2016-03-15 ENCOUNTER — Other Ambulatory Visit (HOSPITAL_BASED_OUTPATIENT_CLINIC_OR_DEPARTMENT_OTHER): Payer: Medicare Other

## 2016-03-15 DIAGNOSIS — C3432 Malignant neoplasm of lower lobe, left bronchus or lung: Secondary | ICD-10-CM

## 2016-03-15 DIAGNOSIS — C3490 Malignant neoplasm of unspecified part of unspecified bronchus or lung: Secondary | ICD-10-CM

## 2016-03-15 LAB — CBC WITH DIFFERENTIAL/PLATELET
BASO%: 0.2 % (ref 0.0–2.0)
BASOS ABS: 0 10*3/uL (ref 0.0–0.1)
EOS%: 0.6 % (ref 0.0–7.0)
Eosinophils Absolute: 0 10*3/uL (ref 0.0–0.5)
HCT: 39.4 % (ref 38.4–49.9)
HEMOGLOBIN: 12.9 g/dL — AB (ref 13.0–17.1)
LYMPH%: 24.8 % (ref 14.0–49.0)
MCH: 30.8 pg (ref 27.2–33.4)
MCHC: 32.8 g/dL (ref 32.0–36.0)
MCV: 93.7 fL (ref 79.3–98.0)
MONO#: 0.1 10*3/uL (ref 0.1–0.9)
MONO%: 2.6 % (ref 0.0–14.0)
NEUT#: 2.6 10*3/uL (ref 1.5–6.5)
NEUT%: 71.8 % (ref 39.0–75.0)
Platelets: 86 10*3/uL — ABNORMAL LOW (ref 140–400)
RBC: 4.21 10*6/uL (ref 4.20–5.82)
RDW: 15.2 % — AB (ref 11.0–14.6)
WBC: 3.7 10*3/uL — AB (ref 4.0–10.3)
lymph#: 0.9 10*3/uL (ref 0.9–3.3)

## 2016-03-15 LAB — COMPREHENSIVE METABOLIC PANEL
ALBUMIN: 3.1 g/dL — AB (ref 3.5–5.0)
ALT: 17 U/L (ref 0–55)
AST: 19 U/L (ref 5–34)
Alkaline Phosphatase: 54 U/L (ref 40–150)
Anion Gap: 9 mEq/L (ref 3–11)
BILIRUBIN TOTAL: 1.35 mg/dL — AB (ref 0.20–1.20)
BUN: 20.5 mg/dL (ref 7.0–26.0)
CO2: 26 meq/L (ref 22–29)
CREATININE: 1.2 mg/dL (ref 0.7–1.3)
Calcium: 9 mg/dL (ref 8.4–10.4)
Chloride: 103 mEq/L (ref 98–109)
EGFR: 68 mL/min/{1.73_m2} — AB (ref >90)
Glucose: 147 mg/dl — ABNORMAL HIGH (ref 70–140)
Potassium: 3.9 mEq/L (ref 3.5–5.1)
SODIUM: 138 meq/L (ref 136–145)
Total Protein: 6.2 g/dL — ABNORMAL LOW (ref 6.4–8.3)

## 2016-03-22 ENCOUNTER — Other Ambulatory Visit (HOSPITAL_BASED_OUTPATIENT_CLINIC_OR_DEPARTMENT_OTHER): Payer: Medicare Other

## 2016-03-22 ENCOUNTER — Ambulatory Visit: Payer: Medicare Other | Admitting: Internal Medicine

## 2016-03-22 ENCOUNTER — Ambulatory Visit: Payer: Medicare Other

## 2016-03-22 DIAGNOSIS — C3490 Malignant neoplasm of unspecified part of unspecified bronchus or lung: Secondary | ICD-10-CM

## 2016-03-22 DIAGNOSIS — C3432 Malignant neoplasm of lower lobe, left bronchus or lung: Secondary | ICD-10-CM

## 2016-03-22 LAB — COMPREHENSIVE METABOLIC PANEL
ALT: 24 U/L (ref 0–55)
AST: 22 U/L (ref 5–34)
Albumin: 3.2 g/dL — ABNORMAL LOW (ref 3.5–5.0)
Alkaline Phosphatase: 53 U/L (ref 40–150)
Anion Gap: 7 mEq/L (ref 3–11)
BUN: 10.9 mg/dL (ref 7.0–26.0)
CHLORIDE: 109 meq/L (ref 98–109)
CO2: 24 meq/L (ref 22–29)
Calcium: 9 mg/dL (ref 8.4–10.4)
Creatinine: 1.3 mg/dL (ref 0.7–1.3)
EGFR: 63 mL/min/{1.73_m2} — AB (ref >90)
GLUCOSE: 148 mg/dL — AB (ref 70–140)
POTASSIUM: 3.8 meq/L (ref 3.5–5.1)
SODIUM: 139 meq/L (ref 136–145)
TOTAL PROTEIN: 6.4 g/dL (ref 6.4–8.3)
Total Bilirubin: 0.71 mg/dL (ref 0.20–1.20)

## 2016-03-22 LAB — CBC WITH DIFFERENTIAL/PLATELET
BASO%: 0.3 % (ref 0.0–2.0)
Basophils Absolute: 0 10*3/uL (ref 0.0–0.1)
EOS ABS: 0.1 10*3/uL (ref 0.0–0.5)
EOS%: 5.4 % (ref 0.0–7.0)
HCT: 38.5 % (ref 38.4–49.9)
HGB: 12.7 g/dL — ABNORMAL LOW (ref 13.0–17.1)
LYMPH%: 32.7 % (ref 14.0–49.0)
MCH: 31.3 pg (ref 27.2–33.4)
MCHC: 33.1 g/dL (ref 32.0–36.0)
MCV: 94.6 fL (ref 79.3–98.0)
MONO#: 0.3 10*3/uL (ref 0.1–0.9)
MONO%: 10.9 % (ref 0.0–14.0)
NEUT%: 50.7 % (ref 39.0–75.0)
NEUTROS ABS: 1.4 10*3/uL — AB (ref 1.5–6.5)
Platelets: 94 10*3/uL — ABNORMAL LOW (ref 140–400)
RBC: 4.07 10*6/uL — AB (ref 4.20–5.82)
RDW: 16.1 % — ABNORMAL HIGH (ref 11.0–14.6)
WBC: 2.7 10*3/uL — AB (ref 4.0–10.3)
lymph#: 0.9 10*3/uL (ref 0.9–3.3)

## 2016-03-29 ENCOUNTER — Encounter: Payer: Self-pay | Admitting: *Deleted

## 2016-03-29 ENCOUNTER — Ambulatory Visit (HOSPITAL_BASED_OUTPATIENT_CLINIC_OR_DEPARTMENT_OTHER): Payer: Medicare Other | Admitting: Internal Medicine

## 2016-03-29 ENCOUNTER — Ambulatory Visit (HOSPITAL_BASED_OUTPATIENT_CLINIC_OR_DEPARTMENT_OTHER): Payer: Medicare Other

## 2016-03-29 ENCOUNTER — Encounter: Payer: Self-pay | Admitting: Internal Medicine

## 2016-03-29 ENCOUNTER — Other Ambulatory Visit (HOSPITAL_BASED_OUTPATIENT_CLINIC_OR_DEPARTMENT_OTHER): Payer: Medicare Other

## 2016-03-29 VITALS — BP 128/68 | HR 82 | Temp 98.4°F | Resp 18 | Ht 73.0 in | Wt 245.7 lb

## 2016-03-29 DIAGNOSIS — C3432 Malignant neoplasm of lower lobe, left bronchus or lung: Secondary | ICD-10-CM

## 2016-03-29 DIAGNOSIS — M8580 Other specified disorders of bone density and structure, unspecified site: Secondary | ICD-10-CM | POA: Diagnosis not present

## 2016-03-29 DIAGNOSIS — C3490 Malignant neoplasm of unspecified part of unspecified bronchus or lung: Secondary | ICD-10-CM

## 2016-03-29 DIAGNOSIS — R5383 Other fatigue: Secondary | ICD-10-CM | POA: Diagnosis not present

## 2016-03-29 DIAGNOSIS — Z5111 Encounter for antineoplastic chemotherapy: Secondary | ICD-10-CM | POA: Diagnosis not present

## 2016-03-29 LAB — CBC WITH DIFFERENTIAL/PLATELET
BASO%: 0 % (ref 0.0–2.0)
BASOS ABS: 0 10*3/uL (ref 0.0–0.1)
EOS ABS: 0 10*3/uL (ref 0.0–0.5)
EOS%: 0 % (ref 0.0–7.0)
HCT: 36.6 % — ABNORMAL LOW (ref 38.4–49.9)
HEMOGLOBIN: 12.6 g/dL — AB (ref 13.0–17.1)
LYMPH%: 15.8 % (ref 14.0–49.0)
MCH: 31.5 pg (ref 27.2–33.4)
MCHC: 34.4 g/dL (ref 32.0–36.0)
MCV: 91.5 fL (ref 79.3–98.0)
MONO#: 0.2 10*3/uL (ref 0.1–0.9)
MONO%: 6.4 % (ref 0.0–14.0)
NEUT#: 2.6 10*3/uL (ref 1.5–6.5)
NEUT%: 77.8 % — ABNORMAL HIGH (ref 39.0–75.0)
Platelets: 239 10*3/uL (ref 140–400)
RBC: 4 10*6/uL — AB (ref 4.20–5.82)
RDW: 14.6 % (ref 11.0–14.6)
WBC: 3.3 10*3/uL — AB (ref 4.0–10.3)
lymph#: 0.5 10*3/uL — ABNORMAL LOW (ref 0.9–3.3)

## 2016-03-29 LAB — COMPREHENSIVE METABOLIC PANEL
ALBUMIN: 3.4 g/dL — AB (ref 3.5–5.0)
ALK PHOS: 64 U/L (ref 40–150)
ALT: 17 U/L (ref 0–55)
AST: 16 U/L (ref 5–34)
Anion Gap: 11 mEq/L (ref 3–11)
BUN: 17.2 mg/dL (ref 7.0–26.0)
CHLORIDE: 108 meq/L (ref 98–109)
CO2: 19 meq/L — AB (ref 22–29)
Calcium: 9.3 mg/dL (ref 8.4–10.4)
Creatinine: 1.2 mg/dL (ref 0.7–1.3)
EGFR: 67 mL/min/{1.73_m2} — AB (ref >90)
GLUCOSE: 233 mg/dL — AB (ref 70–140)
POTASSIUM: 4.3 meq/L (ref 3.5–5.1)
SODIUM: 138 meq/L (ref 136–145)
Total Bilirubin: 0.43 mg/dL (ref 0.20–1.20)
Total Protein: 6.6 g/dL (ref 6.4–8.3)

## 2016-03-29 MED ORDER — DEXAMETHASONE SODIUM PHOSPHATE 100 MG/10ML IJ SOLN
10.0000 mg | Freq: Once | INTRAMUSCULAR | Status: AC
  Administered 2016-03-29: 10 mg via INTRAVENOUS
  Filled 2016-03-29: qty 1

## 2016-03-29 MED ORDER — PALONOSETRON HCL INJECTION 0.25 MG/5ML
INTRAVENOUS | Status: AC
  Filled 2016-03-29: qty 5

## 2016-03-29 MED ORDER — SODIUM CHLORIDE 0.9 % IV SOLN
Freq: Once | INTRAVENOUS | Status: AC
  Administered 2016-03-29: 11:00:00 via INTRAVENOUS

## 2016-03-29 MED ORDER — SODIUM CHLORIDE 0.9 % IV SOLN
500.0000 mg/m2 | Freq: Once | INTRAVENOUS | Status: AC
  Administered 2016-03-29: 1200 mg via INTRAVENOUS
  Filled 2016-03-29: qty 48

## 2016-03-29 MED ORDER — PALONOSETRON HCL INJECTION 0.25 MG/5ML
0.2500 mg | Freq: Once | INTRAVENOUS | Status: AC
  Administered 2016-03-29: 0.25 mg via INTRAVENOUS

## 2016-03-29 MED ORDER — SODIUM CHLORIDE 0.9 % IV SOLN
500.0000 mg | Freq: Once | INTRAVENOUS | Status: AC
  Administered 2016-03-29: 500 mg via INTRAVENOUS
  Filled 2016-03-29: qty 50

## 2016-03-29 NOTE — Progress Notes (Signed)
[facility] [phone]   [phone]  OFFICE PROGRESS NOTE  [name], MD [address] [place] [address]  DIAGNOSIS: Stage IV (T3, N2, M1b) non-small cell lung cancer, adenocarcinoma with negative EGFR mutation, negative ALK gene translocation, negative ROS 1, presented with large left lower lobe consolidative mass in addition to bilateral pulmonary nodules, right hilar and subcarinal lymphadenopathy as well as suspicious bony lesion diagnosed in February 2017.  PRIOR THERAPY: None.  CURRENT THERAPY: Systemic chemotherapy with carboplatin for AUC of 5 and Alimta 500 MG/M2 every 3 weeks. First dose 12/29/2015. Status post 4 cycles.  INTERVAL HISTORY: [facility] 80 y.o. male returns to the clinic today for follow-up visit accompanied by his wife, son and daughter. The patient tolerated the last cycle of his systemic chemotherapy with carboplatin and Alimta fairly well except for mild prolonged fatigue. He denied having any significant fever or chills. He has no chest pain, shortness of breath except with exertion, cough or hemoptysis. He is here today to start cycle #5 of his treatment.  MEDICAL HISTORY: Past Medical History  Diagnosis Date  . Hypertension   . Hypercholesteremia   . Atrial fibrillation ([place])   . Pneumonia 11/18/2015  . GERD (gastroesophageal reflux disease)   . Non-small cell lung cancer (NSCLC) ([facility]) 11/19/15  . Encounter for antineoplastic chemotherapy 01/05/2016    ALLERGIES:  has No Known Allergies.  MEDICATIONS:  Current Outpatient Prescriptions  Medication Sig Dispense Refill  . aspirin 81 MG tablet Take 81 mg by mouth daily.    Marland Kitchen atorvastatin (LIPITOR) 20 MG tablet Take 20 mg by mouth daily.    . Brinzolamide-Brimonidine (SIMBRINZA) 1-0.2 % SUSP Apply 2 drops to eye 2 (two) times daily.    Marland Kitchen dexamethasone (DECADRON) 4 MG tablet 4 mg by mouth twice a day the day before, day of and day after the chemotherapy  every 3 weeks 40 tablet 1  . diltiazem (DILACOR XR) 180 MG 24 hr capsule Take 180 mg by mouth daily.    . dorzolamide (TRUSOPT) 2 % ophthalmic solution Place 1 drop into the left eye 2 (two) times daily.    Marland Kitchen ELIQUIS 5 MG TABS tablet Take 5 mg by mouth 2 (two) times daily.  1  . folic acid (FOLVITE) 1 MG tablet Take 1 tablet (1 mg total) by mouth daily. 90 tablet 0  . latanoprost (XALATAN) 0.005 % ophthalmic solution Place 1 drop into both eyes at bedtime.    . metoprolol tartrate (LOPRESSOR) 25 MG tablet Take 25 mg by mouth 2 (two) times daily.    . Multiple Vitamins-Minerals (MULTIVITAMIN WITH MINERALS) tablet Take 1 tablet by mouth daily.    . prochlorperazine (COMPAZINE) 10 MG tablet Take 1 tablet (10 mg total) by mouth every 6 (six) hours as needed for nausea or vomiting. 30 tablet 0  . psyllium (METAMUCIL) 58.6 % powder Take 1 packet by mouth daily.    . tamsulosin (FLOMAX) 0.4 MG CAPS capsule Take 0.4 mg by mouth daily.     No current facility-administered medications for this visit.    SURGICAL HISTORY:  Past Surgical History  Procedure Laterality Date  . Wisdom tooth extraction    . Rotator cuff repair Right   . Video bronchoscopy Bilateral 11/19/2015    Procedure: VIDEO BRONCHOSCOPY WITH FLUORO;  Surgeon: Rigoberto Noel, MD;  Location: [facility];  Service: Cardiopulmonary;  Laterality: Bilateral;  . Cataract extraction Bilateral     REVIEW OF  SYSTEMS:  A comprehensive review of systems was negative except for: Constitutional: positive for fatigue   PHYSICAL EXAMINATION: General appearance: alert, cooperative and no distress Head: Normocephalic, without obvious abnormality, atraumatic Neck: no adenopathy, no JVD, supple, symmetrical, trachea midline and thyroid not enlarged, symmetric, no tenderness/mass/nodules Lymph nodes: Cervical, supraclavicular, and axillary nodes normal. Resp: clear to auscultation bilaterally Back: symmetric, no curvature. ROM normal. No CVA  tenderness. Cardio: regular rate and rhythm, S1, S2 normal, no murmur, click, rub or gallop GI: soft, non-tender; bowel sounds normal; no masses,  no organomegaly Extremities: extremities normal, atraumatic, no cyanosis or edema Neurologic: Alert and oriented X 3, normal strength and tone. Normal symmetric reflexes. Normal coordination and gait  ECOG PERFORMANCE STATUS: 1 - Symptomatic but completely ambulatory  Blood pressure 128/68, pulse 82, temperature 98.4 F (36.9 C), temperature source Oral, resp. rate 18, height _0  (1.854 m), weight 245 lb 11.2 oz (111.449 kg), SpO2 98 %.  LABORATORY DATA: Lab Results  Component Value Date   WBC 3.3* 03/29/2016   HGB 12.6* 03/29/2016   HCT 36.6* 03/29/2016   MCV 91.5 03/29/2016   PLT 239 03/29/2016      Chemistry      Component Value Date/Time   NA 139 03/22/2016 0916   NA 140 11/24/2015 0453   K 3.8 03/22/2016 0916   K 4.1 11/24/2015 0453   CL 106 11/24/2015 0453   CO2 24 03/22/2016 0916   CO2 28 11/24/2015 0453   BUN 10.9 03/22/2016 0916   BUN 11 11/24/2015 0453   CREATININE 1.3 03/22/2016 0916   CREATININE 1.15 11/24/2015 0453      Component Value Date/Time   CALCIUM 9.0 03/22/2016 0916   CALCIUM 9.1 11/24/2015 0453   ALKPHOS 53 03/22/2016 0916   ALKPHOS 77 11/17/2015 1143   AST 22 03/22/2016 0916   AST 32 11/17/2015 1143   ALT 24 03/22/2016 0916   ALT 19 11/17/2015 1143   BILITOT 0.71 03/22/2016 0916   BILITOT 0.5 11/17/2015 1143       RADIOGRAPHIC STUDIES: No results found.  ASSESSMENT AND PLAN: This is a very pleasant 80 years old African-American male with metastatic non-small cell lung cancer, adenocarcinoma. The molecular study showed no actionable mutations. The patient is currently undergoing systemic chemotherapy with carboplatin and Alimta status post 4 cycles. He tolerated the last cycle of his treatment fairly well with no significant complaints except for mild fatigue. I recommended for the patient to  proceed with cycle #5 today as scheduled. I will see him back for follow-up visit in 3 weeks for evaluation before starting cycle #6. He was advised to call immediately if he has any concerning symptoms in the interval. The patient voices understanding of current disease status and treatment options and is in agreement with the current care plan.  All questions were answered. The patient knows to call the clinic with any problems, questions or concerns. We can certainly see the patient much sooner if necessary.  Disclaimer: This note was dictated with voice recognition software. Similar sounding words can inadvertently be transcribed and may not be corrected upon review.

## 2016-03-29 NOTE — Patient Instructions (Signed)
[facility] Discharge Instructions for Patients Receiving Chemotherapy  Today you received the following chemotherapy agents:  ALIMTA AND CARBOPLATIN.  To help prevent nausea and vomiting after your treatment, we encourage you to take your nausea medication as directed.   If you develop nausea and vomiting that is not controlled by your nausea medication, call the clinic.   BELOW ARE SYMPTOMS THAT SHOULD BE REPORTED IMMEDIATELY:  *FEVER GREATER THAN 100.5 F  *CHILLS WITH OR WITHOUT FEVER  NAUSEA AND VOMITING THAT IS NOT CONTROLLED WITH YOUR NAUSEA MEDICATION  *UNUSUAL SHORTNESS OF BREATH  *UNUSUAL BRUISING OR BLEEDING  TENDERNESS IN MOUTH AND THROAT WITH OR WITHOUT PRESENCE OF ULCERS  *URINARY PROBLEMS  *BOWEL PROBLEMS  UNUSUAL RASH Items with * indicate a potential emergency and should be followed up as soon as possible.  Feel free to call the clinic you have any questions or concerns. The clinic phone number is [phone].  Please show the [place] at check-in to the Emergency Department and triage nurse.

## 2016-03-29 NOTE — Progress Notes (Signed)
Oncology Nurse Navigator Documentation  Oncology Nurse Navigator Flowsheets 03/29/2016  Navigator Location [facility]-Med Onc  Navigator Encounter Type Clinic/[facility]  Patient Visit Type MedOnc  Treatment Phase Treatment  Barriers/Navigation Needs No barriers at this time  Interventions None required  Acuity Level 1  Acuity Level 1 Minimal follow up required  Time Spent with Patient 58   Spoke with patient and family today at [facility].  Patient is without complaints.  No barriers noted at this time.

## 2016-04-03 ENCOUNTER — Ambulatory Visit (INDEPENDENT_AMBULATORY_CARE_PROVIDER_SITE_OTHER): Payer: Medicare Other | Admitting: Pulmonary Disease

## 2016-04-03 DIAGNOSIS — R0600 Dyspnea, unspecified: Secondary | ICD-10-CM | POA: Diagnosis not present

## 2016-04-03 DIAGNOSIS — C3492 Malignant neoplasm of unspecified part of left bronchus or lung: Secondary | ICD-10-CM

## 2016-04-03 LAB — PULMONARY FUNCTION TEST
DL/VA % PRED: 76 %
DL/VA: 3.62 ml/min/mmHg/L
DLCO COR % PRED: 55 %
DLCO UNC % PRED: 52 %
DLCO cor: 20.02 ml/min/mmHg
DLCO unc: 19.05 ml/min/mmHg
FEF 25-75 PRE: 1.87 L/s
FEF 25-75 Post: 1.8 L/sec
FEF2575-%CHANGE-POST: -3 %
FEF2575-%PRED-PRE: 76 %
FEF2575-%Pred-Post: 73 %
FEV1-%Change-Post: 0 %
FEV1-%PRED-PRE: 83 %
FEV1-%Pred-Post: 82 %
FEV1-POST: 2.54 L
FEV1-Pre: 2.57 L
FEV1FVC-%CHANGE-POST: 6 %
FEV1FVC-%PRED-PRE: 99 %
FEV6-%CHANGE-POST: -4 %
FEV6-%PRED-PRE: 84 %
FEV6-%Pred-Post: 80 %
FEV6-Post: 3.19 L
FEV6-Pre: 3.33 L
FEV6FVC-%Change-Post: 3 %
FEV6FVC-%Pred-Post: 105 %
FEV6FVC-%Pred-Pre: 102 %
FVC-%Change-Post: -6 %
FVC-%PRED-POST: 77 %
FVC-%PRED-PRE: 82 %
FVC-POST: 3.21 L
FVC-PRE: 3.44 L
POST FEV1/FVC RATIO: 79 %
POST FEV6/FVC RATIO: 100 %
PRE FEV1/FVC RATIO: 75 %
Pre FEV6/FVC Ratio: 97 %
RV % pred: 93 %
RV: 2.58 L
TLC % pred: 81 %
TLC: 6.28 L

## 2016-04-03 NOTE — Progress Notes (Signed)
PFT done today. 

## 2016-04-05 ENCOUNTER — Other Ambulatory Visit (HOSPITAL_BASED_OUTPATIENT_CLINIC_OR_DEPARTMENT_OTHER): Payer: Medicare Other

## 2016-04-05 DIAGNOSIS — C3432 Malignant neoplasm of lower lobe, left bronchus or lung: Secondary | ICD-10-CM

## 2016-04-05 DIAGNOSIS — C3490 Malignant neoplasm of unspecified part of unspecified bronchus or lung: Secondary | ICD-10-CM

## 2016-04-05 LAB — CBC WITH DIFFERENTIAL/PLATELET
BASO%: 0.3 % (ref 0.0–2.0)
Basophils Absolute: 0 10*3/uL (ref 0.0–0.1)
EOS%: 0.5 % (ref 0.0–7.0)
Eosinophils Absolute: 0 10*3/uL (ref 0.0–0.5)
HEMATOCRIT: 39.6 % (ref 38.4–49.9)
HEMOGLOBIN: 13.2 g/dL (ref 13.0–17.1)
LYMPH%: 25.3 % (ref 14.0–49.0)
MCH: 31.7 pg (ref 27.2–33.4)
MCHC: 33.4 g/dL (ref 32.0–36.0)
MCV: 95 fL (ref 79.3–98.0)
MONO#: 0.1 10*3/uL (ref 0.1–0.9)
MONO%: 3.8 % (ref 0.0–14.0)
NEUT#: 2.2 10*3/uL (ref 1.5–6.5)
NEUT%: 70.1 % (ref 39.0–75.0)
PLATELETS: 123 10*3/uL — AB (ref 140–400)
RBC: 4.16 10*6/uL — ABNORMAL LOW (ref 4.20–5.82)
RDW: 14.9 % — ABNORMAL HIGH (ref 11.0–14.6)
WBC: 3.2 10*3/uL — AB (ref 4.0–10.3)
lymph#: 0.8 10*3/uL — ABNORMAL LOW (ref 0.9–3.3)

## 2016-04-05 LAB — COMPREHENSIVE METABOLIC PANEL
ALBUMIN: 3.3 g/dL — AB (ref 3.5–5.0)
ALK PHOS: 61 U/L (ref 40–150)
ALT: 15 U/L (ref 0–55)
ANION GAP: 11 meq/L (ref 3–11)
AST: 17 U/L (ref 5–34)
BUN: 21 mg/dL (ref 7.0–26.0)
CALCIUM: 9.6 mg/dL (ref 8.4–10.4)
CHLORIDE: 101 meq/L (ref 98–109)
CO2: 26 mEq/L (ref 22–29)
Creatinine: 1.3 mg/dL (ref 0.7–1.3)
EGFR: 63 mL/min/{1.73_m2} — AB (ref >90)
Glucose: 167 mg/dl — ABNORMAL HIGH (ref 70–140)
POTASSIUM: 4.1 meq/L (ref 3.5–5.1)
Sodium: 137 mEq/L (ref 136–145)
Total Bilirubin: 1.45 mg/dL — ABNORMAL HIGH (ref 0.20–1.20)
Total Protein: 6.8 g/dL (ref 6.4–8.3)

## 2016-04-12 ENCOUNTER — Other Ambulatory Visit: Payer: Self-pay | Admitting: Internal Medicine

## 2016-04-12 ENCOUNTER — Other Ambulatory Visit (HOSPITAL_BASED_OUTPATIENT_CLINIC_OR_DEPARTMENT_OTHER): Payer: Medicare Other

## 2016-04-12 DIAGNOSIS — C3432 Malignant neoplasm of lower lobe, left bronchus or lung: Secondary | ICD-10-CM | POA: Diagnosis not present

## 2016-04-12 DIAGNOSIS — C3490 Malignant neoplasm of unspecified part of unspecified bronchus or lung: Secondary | ICD-10-CM

## 2016-04-12 LAB — CBC WITH DIFFERENTIAL/PLATELET
BASO%: 0 % (ref 0.0–2.0)
BASOS ABS: 0 10*3/uL (ref 0.0–0.1)
EOS ABS: 0 10*3/uL (ref 0.0–0.5)
EOS%: 1.4 % (ref 0.0–7.0)
HCT: 31.8 % — ABNORMAL LOW (ref 38.4–49.9)
HEMOGLOBIN: 11.1 g/dL — AB (ref 13.0–17.1)
LYMPH%: 30.1 % (ref 14.0–49.0)
MCH: 32.1 pg (ref 27.2–33.4)
MCHC: 34.9 g/dL (ref 32.0–36.0)
MCV: 91.9 fL (ref 79.3–98.0)
MONO#: 0.4 10*3/uL (ref 0.1–0.9)
MONO%: 19.1 % — AB (ref 0.0–14.0)
NEUT#: 1 10*3/uL — ABNORMAL LOW (ref 1.5–6.5)
NEUT%: 49.4 % (ref 39.0–75.0)
Platelets: 55 10*3/uL — ABNORMAL LOW (ref 140–400)
RBC: 3.46 10*6/uL — ABNORMAL LOW (ref 4.20–5.82)
RDW: 14.8 % — AB (ref 11.0–14.6)
WBC: 2.1 10*3/uL — ABNORMAL LOW (ref 4.0–10.3)
lymph#: 0.6 10*3/uL — ABNORMAL LOW (ref 0.9–3.3)

## 2016-04-12 LAB — COMPREHENSIVE METABOLIC PANEL
ALBUMIN: 3.2 g/dL — AB (ref 3.5–5.0)
ALK PHOS: 60 U/L (ref 40–150)
ALT: 18 U/L (ref 0–55)
ANION GAP: 10 meq/L (ref 3–11)
AST: 20 U/L (ref 5–34)
BILIRUBIN TOTAL: 0.46 mg/dL (ref 0.20–1.20)
BUN: 11.1 mg/dL (ref 7.0–26.0)
CALCIUM: 8.9 mg/dL (ref 8.4–10.4)
CO2: 21 mEq/L — ABNORMAL LOW (ref 22–29)
CREATININE: 1.1 mg/dL (ref 0.7–1.3)
Chloride: 109 mEq/L (ref 98–109)
EGFR: 74 mL/min/{1.73_m2} — AB (ref >90)
Glucose: 160 mg/dl — ABNORMAL HIGH (ref 70–140)
Potassium: 3.9 mEq/L (ref 3.5–5.1)
Sodium: 140 mEq/L (ref 136–145)
TOTAL PROTEIN: 6 g/dL — AB (ref 6.4–8.3)

## 2016-04-19 ENCOUNTER — Ambulatory Visit (HOSPITAL_BASED_OUTPATIENT_CLINIC_OR_DEPARTMENT_OTHER): Payer: Medicare Other | Admitting: Internal Medicine

## 2016-04-19 ENCOUNTER — Ambulatory Visit (HOSPITAL_BASED_OUTPATIENT_CLINIC_OR_DEPARTMENT_OTHER): Payer: Medicare Other

## 2016-04-19 ENCOUNTER — Encounter: Payer: Self-pay | Admitting: Internal Medicine

## 2016-04-19 ENCOUNTER — Other Ambulatory Visit (HOSPITAL_BASED_OUTPATIENT_CLINIC_OR_DEPARTMENT_OTHER): Payer: Medicare Other

## 2016-04-19 ENCOUNTER — Telehealth: Payer: Self-pay | Admitting: Internal Medicine

## 2016-04-19 VITALS — BP 130/67 | HR 85 | Temp 98.0°F | Resp 17 | Ht 73.0 in | Wt 243.7 lb

## 2016-04-19 DIAGNOSIS — C3432 Malignant neoplasm of lower lobe, left bronchus or lung: Secondary | ICD-10-CM

## 2016-04-19 DIAGNOSIS — C3490 Malignant neoplasm of unspecified part of unspecified bronchus or lung: Secondary | ICD-10-CM

## 2016-04-19 DIAGNOSIS — Z5111 Encounter for antineoplastic chemotherapy: Secondary | ICD-10-CM

## 2016-04-19 LAB — CBC WITH DIFFERENTIAL/PLATELET
BASO%: 0.4 % (ref 0.0–2.0)
BASOS ABS: 0 10*3/uL (ref 0.0–0.1)
EOS%: 0 % (ref 0.0–7.0)
Eosinophils Absolute: 0 10*3/uL (ref 0.0–0.5)
HCT: 35.4 % — ABNORMAL LOW (ref 38.4–49.9)
HEMOGLOBIN: 11.9 g/dL — AB (ref 13.0–17.1)
LYMPH%: 11.5 % — AB (ref 14.0–49.0)
MCH: 32.1 pg (ref 27.2–33.4)
MCHC: 33.5 g/dL (ref 32.0–36.0)
MCV: 95.7 fL (ref 79.3–98.0)
MONO#: 0.4 10*3/uL (ref 0.1–0.9)
MONO%: 9.7 % (ref 0.0–14.0)
NEUT#: 2.9 10*3/uL (ref 1.5–6.5)
NEUT%: 78.4 % — AB (ref 39.0–75.0)
Platelets: 184 10*3/uL (ref 140–400)
RBC: 3.69 10*6/uL — ABNORMAL LOW (ref 4.20–5.82)
RDW: 15.7 % — AB (ref 11.0–14.6)
WBC: 3.6 10*3/uL — ABNORMAL LOW (ref 4.0–10.3)
lymph#: 0.4 10*3/uL — ABNORMAL LOW (ref 0.9–3.3)

## 2016-04-19 LAB — COMPREHENSIVE METABOLIC PANEL
ALBUMIN: 3.4 g/dL — AB (ref 3.5–5.0)
ALK PHOS: 62 U/L (ref 40–150)
ALT: 19 U/L (ref 0–55)
AST: 21 U/L (ref 5–34)
Anion Gap: 11 mEq/L (ref 3–11)
BUN: 15.1 mg/dL (ref 7.0–26.0)
CHLORIDE: 108 meq/L (ref 98–109)
CO2: 20 mEq/L — ABNORMAL LOW (ref 22–29)
Calcium: 9.3 mg/dL (ref 8.4–10.4)
Creatinine: 1.2 mg/dL (ref 0.7–1.3)
EGFR: 69 mL/min/{1.73_m2} — AB (ref >90)
GLUCOSE: 183 mg/dL — AB (ref 70–140)
POTASSIUM: 4.2 meq/L (ref 3.5–5.1)
SODIUM: 138 meq/L (ref 136–145)
Total Bilirubin: 0.39 mg/dL (ref 0.20–1.20)
Total Protein: 6.7 g/dL (ref 6.4–8.3)

## 2016-04-19 MED ORDER — SODIUM CHLORIDE 0.9 % IV SOLN
10.0000 mg | Freq: Once | INTRAVENOUS | Status: AC
  Administered 2016-04-19: 10 mg via INTRAVENOUS
  Filled 2016-04-19: qty 1

## 2016-04-19 MED ORDER — CYANOCOBALAMIN 1000 MCG/ML IJ SOLN
INTRAMUSCULAR | Status: AC
  Filled 2016-04-19: qty 1

## 2016-04-19 MED ORDER — SODIUM CHLORIDE 0.9 % IV SOLN
Freq: Once | INTRAVENOUS | Status: AC
  Administered 2016-04-19: 10:00:00 via INTRAVENOUS

## 2016-04-19 MED ORDER — CYANOCOBALAMIN 1000 MCG/ML IJ SOLN
1000.0000 ug | Freq: Once | INTRAMUSCULAR | Status: AC
  Administered 2016-04-19: 1000 ug via INTRAMUSCULAR

## 2016-04-19 MED ORDER — SODIUM CHLORIDE 0.9 % IV SOLN
568.0000 mg | Freq: Once | INTRAVENOUS | Status: AC
  Administered 2016-04-19: 570 mg via INTRAVENOUS
  Filled 2016-04-19: qty 57

## 2016-04-19 MED ORDER — PALONOSETRON HCL INJECTION 0.25 MG/5ML
INTRAVENOUS | Status: AC
  Filled 2016-04-19: qty 5

## 2016-04-19 MED ORDER — PALONOSETRON HCL INJECTION 0.25 MG/5ML
0.2500 mg | Freq: Once | INTRAVENOUS | Status: AC
  Administered 2016-04-19: 0.25 mg via INTRAVENOUS

## 2016-04-19 MED ORDER — SODIUM CHLORIDE 0.9 % IV SOLN
500.0000 mg/m2 | Freq: Once | INTRAVENOUS | Status: AC
  Administered 2016-04-19: 1200 mg via INTRAVENOUS
  Filled 2016-04-19: qty 40

## 2016-04-19 NOTE — Telephone Encounter (Signed)
Gave pt cal & avs °

## 2016-04-19 NOTE — Patient Instructions (Signed)
[facility] Discharge Instructions for Patients Receiving Chemotherapy  Today you received the following chemotherapy agents Alimta/Carboplatin/B-12 injection.  To help prevent nausea and vomiting after your treatment, we encourage you to take your nausea medication as directed.    If you develop nausea and vomiting that is not controlled by your nausea medication, call the clinic.   BELOW ARE SYMPTOMS THAT SHOULD BE REPORTED IMMEDIATELY:  *FEVER GREATER THAN 100.5 F  *CHILLS WITH OR WITHOUT FEVER  NAUSEA AND VOMITING THAT IS NOT CONTROLLED WITH YOUR NAUSEA MEDICATION  *UNUSUAL SHORTNESS OF BREATH  *UNUSUAL BRUISING OR BLEEDING  TENDERNESS IN MOUTH AND THROAT WITH OR WITHOUT PRESENCE OF ULCERS  *URINARY PROBLEMS  *BOWEL PROBLEMS  UNUSUAL RASH Items with * indicate a potential emergency and should be followed up as soon as possible.  Feel free to call the clinic you have any questions or concerns. The clinic phone number is [phone].  Please show the [place] at check-in to the Emergency Department and triage nurse.

## 2016-04-19 NOTE — Progress Notes (Signed)
[facility] [phone]   [phone]  OFFICE PROGRESS NOTE  [name], MD [address] [place] [address]  DIAGNOSIS: Stage IV (T3, N2, M1b) non-small cell lung cancer, adenocarcinoma with negative EGFR mutation, negative ALK gene translocation, negative ROS 1, presented with large left lower lobe consolidative mass in addition to bilateral pulmonary nodules, right hilar and subcarinal lymphadenopathy as well as suspicious bony lesion diagnosed in February 2017.  PRIOR THERAPY: None.  CURRENT THERAPY: Systemic chemotherapy with carboplatin for AUC of 5 and Alimta 500 MG/M2 every 3 weeks. First dose 12/29/2015. Status post 5 cycles.  INTERVAL HISTORY: Matthew Berger 80 y.o. male returns to the clinic today for follow-up visit accompanied by his son and daughter. The patient is feeling fine today with no specific complaints except for mild fatigue. He tolerated the last cycle of his systemic chemotherapy with carboplatin and Alimta fairly well except for mild prolonged fatigue. He denied having any significant fever or chills. He has no chest pain, shortness of breath except with exertion, cough or hemoptysis. He is scheduled for a stress test on 04/24/2016 by his cardiologist. He is here today to start cycle #6 of his treatment.  MEDICAL HISTORY: Past Medical History  Diagnosis Date  . Hypertension   . Hypercholesteremia   . Atrial fibrillation ([place])   . Pneumonia 11/18/2015  . GERD (gastroesophageal reflux disease)   . Non-small cell lung cancer (NSCLC) ([facility]) 11/19/15  . Encounter for antineoplastic chemotherapy 01/05/2016    ALLERGIES:  has No Known Allergies.  MEDICATIONS:  Current Outpatient Prescriptions  Medication Sig Dispense Refill  . aspirin 81 MG tablet Take 81 mg by mouth daily.    . Brinzolamide-Brimonidine (SIMBRINZA) 1-0.2 % SUSP Apply 2 drops to eye 2 (two) times daily.    Marland Kitchen dexamethasone (DECADRON) 4 MG tablet 4 mg by  mouth twice a day the day before, day of and day after the chemotherapy every 3 weeks 40 tablet 1  . diltiazem (CARDIZEM CD) 180 MG 24 hr capsule 1 (ONE) CAPSULE ER 24HR BY MOUTH DAILY  0  . diltiazem (DILACOR XR) 180 MG 24 hr capsule Take 180 mg by mouth daily.    . dorzolamide (TRUSOPT) 2 % ophthalmic solution Place 1 drop into the left eye 2 (two) times daily.    Marland Kitchen ELIQUIS 5 MG TABS tablet Take 5 mg by mouth 2 (two) times daily.  1  . folic acid (FOLVITE) 1 MG tablet TAKE 1 TABLET (1 MG TOTAL) BY MOUTH DAILY.  0  . latanoprost (XALATAN) 0.005 % ophthalmic solution Place 1 drop into both eyes at bedtime.    . metoprolol tartrate (LOPRESSOR) 25 MG tablet Take 25 mg by mouth 2 (two) times daily.    . Multiple Vitamins-Minerals (MULTIVITAMIN WITH MINERALS) tablet Take 1 tablet by mouth daily.    . prochlorperazine (COMPAZINE) 10 MG tablet TAKE 1 TABLET BY MOUTH EVERY 6 HOURS AS NEEDED FOR NAUSEA AND VOMITING 30 tablet 0  . psyllium (METAMUCIL) 58.6 % powder Take 1 packet by mouth daily.    . tamsulosin (FLOMAX) 0.4 MG CAPS capsule Take 0.4 mg by mouth daily.    Marland Kitchen atorvastatin (LIPITOR) 20 MG tablet Take 20 mg by mouth daily.    Marland Kitchen dexamethasone (DECADRON) 4 MG tablet TAKE 1 TABLET BY MOUTH TWICE A DAY WITH A MEAL 40 tablet 1   No current facility-administered medications for this visit.    SURGICAL HISTORY:  Past Surgical  History  Procedure Laterality Date  . Wisdom tooth extraction    . Rotator cuff repair Right   . Video bronchoscopy Bilateral 11/19/2015    Procedure: VIDEO BRONCHOSCOPY WITH FLUORO;  Surgeon: Rigoberto Noel, MD;  Location: [facility];  Service: Cardiopulmonary;  Laterality: Bilateral;  . Cataract extraction Bilateral     REVIEW OF SYSTEMS:  A comprehensive review of systems was negative except for: Constitutional: positive for fatigue   PHYSICAL EXAMINATION: General appearance: alert, cooperative and no distress Head: Normocephalic, without obvious abnormality,  atraumatic Neck: no adenopathy, no JVD, supple, symmetrical, trachea midline and thyroid not enlarged, symmetric, no tenderness/mass/nodules Lymph nodes: Cervical, supraclavicular, and axillary nodes normal. Resp: clear to auscultation bilaterally Back: symmetric, no curvature. ROM normal. No CVA tenderness. Cardio: regular rate and rhythm, S1, S2 normal, no murmur, click, rub or gallop GI: soft, non-tender; bowel sounds normal; no masses,  no organomegaly Extremities: extremities normal, atraumatic, no cyanosis or edema Neurologic: Alert and oriented X 3, normal strength and tone. Normal symmetric reflexes. Normal coordination and gait  ECOG PERFORMANCE STATUS: 1 - Symptomatic but completely ambulatory  Blood pressure 130/67, pulse 85, temperature 98 F (36.7 C), temperature source Oral, resp. rate 17, height _0  (1.854 m), weight 243 lb 11.2 oz (110.542 kg), SpO2 96 %.  LABORATORY DATA: Lab Results  Component Value Date   WBC 3.6* 04/19/2016   HGB 11.9* 04/19/2016   HCT 35.4* 04/19/2016   MCV 95.7 04/19/2016   PLT 184 04/19/2016      Chemistry      Component Value Date/Time   NA 140 04/12/2016 0941   NA 140 11/24/2015 0453   K 3.9 04/12/2016 0941   K 4.1 11/24/2015 0453   CL 106 11/24/2015 0453   CO2 21* 04/12/2016 0941   CO2 28 11/24/2015 0453   BUN 11.1 04/12/2016 0941   BUN 11 11/24/2015 0453   CREATININE 1.1 04/12/2016 0941   CREATININE 1.15 11/24/2015 0453      Component Value Date/Time   CALCIUM 8.9 04/12/2016 0941   CALCIUM 9.1 11/24/2015 0453   ALKPHOS 60 04/12/2016 0941   ALKPHOS 77 11/17/2015 1143   AST 20 04/12/2016 0941   AST 32 11/17/2015 1143   ALT 18 04/12/2016 0941   ALT 19 11/17/2015 1143   BILITOT 0.46 04/12/2016 0941   BILITOT 0.5 11/17/2015 1143       RADIOGRAPHIC STUDIES: No results found.  ASSESSMENT AND PLAN: This is a very pleasant 80 years old African-American male with metastatic non-small cell lung cancer, adenocarcinoma. The  molecular study showed no actionable mutations. The patient is currently undergoing systemic chemotherapy with carboplatin and Alimta status post 5 cycles. He tolerated the last cycle of his treatment fairly well with no significant complaints except for mild fatigue. I recommended for the patient to proceed with cycle #6 today as scheduled. I will see him back for follow-up visit in one month's after repeating CT scan of the chest, abdomen and pelvis for restaging of his disease. He was advised to call immediately if he has any concerning symptoms in the interval. The patient voices understanding of current disease status and treatment options and is in agreement with the current care plan.  All questions were answered. The patient knows to call the clinic with any problems, questions or concerns. We can certainly see the patient much sooner if necessary.  Disclaimer: This note was dictated with voice recognition software. Similar sounding words can inadvertently be transcribed and may not  be corrected upon review.

## 2016-04-24 ENCOUNTER — Ambulatory Visit: Payer: Medicare Other | Admitting: Pulmonary Disease

## 2016-04-24 ENCOUNTER — Ambulatory Visit: Payer: Medicare Other

## 2016-04-26 ENCOUNTER — Other Ambulatory Visit (HOSPITAL_BASED_OUTPATIENT_CLINIC_OR_DEPARTMENT_OTHER): Payer: Medicare Other

## 2016-04-26 DIAGNOSIS — C3432 Malignant neoplasm of lower lobe, left bronchus or lung: Secondary | ICD-10-CM | POA: Diagnosis not present

## 2016-04-26 DIAGNOSIS — C3490 Malignant neoplasm of unspecified part of unspecified bronchus or lung: Secondary | ICD-10-CM

## 2016-04-26 LAB — CBC WITH DIFFERENTIAL/PLATELET
BASO%: 0 % (ref 0.0–2.0)
Basophils Absolute: 0 10*3/uL (ref 0.0–0.1)
EOS%: 0 % (ref 0.0–7.0)
Eosinophils Absolute: 0 10*3/uL (ref 0.0–0.5)
HCT: 35.1 % — ABNORMAL LOW (ref 38.4–49.9)
HGB: 12.4 g/dL — ABNORMAL LOW (ref 13.0–17.1)
LYMPH%: 31.7 % (ref 14.0–49.0)
MCH: 32.6 pg (ref 27.2–33.4)
MCHC: 35.3 g/dL (ref 32.0–36.0)
MCV: 92.4 fL (ref 79.3–98.0)
MONO#: 0.1 10*3/uL (ref 0.1–0.9)
MONO%: 4.5 % (ref 0.0–14.0)
NEUT#: 1.7 10*3/uL (ref 1.5–6.5)
NEUT%: 63.8 % (ref 39.0–75.0)
PLATELETS: 81 10*3/uL — AB (ref 140–400)
RBC: 3.8 10*6/uL — AB (ref 4.20–5.82)
RDW: 14.2 % (ref 11.0–14.6)
WBC: 2.7 10*3/uL — ABNORMAL LOW (ref 4.0–10.3)
lymph#: 0.9 10*3/uL (ref 0.9–3.3)

## 2016-04-26 LAB — COMPREHENSIVE METABOLIC PANEL
ALT: 15 U/L (ref 0–55)
ANION GAP: 11 meq/L (ref 3–11)
AST: 18 U/L (ref 5–34)
Albumin: 3.3 g/dL — ABNORMAL LOW (ref 3.5–5.0)
Alkaline Phosphatase: 61 U/L (ref 40–150)
BILIRUBIN TOTAL: 1.43 mg/dL — AB (ref 0.20–1.20)
BUN: 15.5 mg/dL (ref 7.0–26.0)
CHLORIDE: 103 meq/L (ref 98–109)
CO2: 24 meq/L (ref 22–29)
Calcium: 9.2 mg/dL (ref 8.4–10.4)
Creatinine: 1.2 mg/dL (ref 0.7–1.3)
EGFR: 70 mL/min/{1.73_m2} — AB (ref >90)
Glucose: 149 mg/dl — ABNORMAL HIGH (ref 70–140)
POTASSIUM: 4.1 meq/L (ref 3.5–5.1)
Sodium: 139 mEq/L (ref 136–145)
Total Protein: 6.5 g/dL (ref 6.4–8.3)

## 2016-05-03 ENCOUNTER — Other Ambulatory Visit (HOSPITAL_BASED_OUTPATIENT_CLINIC_OR_DEPARTMENT_OTHER): Payer: Medicare Other

## 2016-05-03 DIAGNOSIS — C3490 Malignant neoplasm of unspecified part of unspecified bronchus or lung: Secondary | ICD-10-CM

## 2016-05-03 DIAGNOSIS — C3432 Malignant neoplasm of lower lobe, left bronchus or lung: Secondary | ICD-10-CM

## 2016-05-03 LAB — CBC WITH DIFFERENTIAL/PLATELET
BASO%: 0.2 % (ref 0.0–2.0)
BASOS ABS: 0 10*3/uL (ref 0.0–0.1)
EOS ABS: 0 10*3/uL (ref 0.0–0.5)
EOS%: 1.2 % (ref 0.0–7.0)
HCT: 34.8 % — ABNORMAL LOW (ref 38.4–49.9)
HGB: 11.4 g/dL — ABNORMAL LOW (ref 13.0–17.1)
LYMPH%: 22.7 % (ref 14.0–49.0)
MCH: 31.8 pg (ref 27.2–33.4)
MCHC: 32.8 g/dL (ref 32.0–36.0)
MCV: 97.1 fL (ref 79.3–98.0)
MONO#: 0.3 10*3/uL (ref 0.1–0.9)
MONO%: 14.3 % — ABNORMAL HIGH (ref 0.0–14.0)
NEUT#: 1.5 10*3/uL (ref 1.5–6.5)
NEUT%: 61.6 % (ref 39.0–75.0)
PLATELETS: 81 10*3/uL — AB (ref 140–400)
RBC: 3.58 10*6/uL — AB (ref 4.20–5.82)
RDW: 15.9 % — ABNORMAL HIGH (ref 11.0–14.6)
WBC: 2.4 10*3/uL — ABNORMAL LOW (ref 4.0–10.3)
lymph#: 0.5 10*3/uL — ABNORMAL LOW (ref 0.9–3.3)

## 2016-05-03 LAB — COMPREHENSIVE METABOLIC PANEL
ALK PHOS: 53 U/L (ref 40–150)
ALT: 14 U/L (ref 0–55)
ANION GAP: 10 meq/L (ref 3–11)
AST: 19 U/L (ref 5–34)
Albumin: 3.3 g/dL — ABNORMAL LOW (ref 3.5–5.0)
BUN: 10.2 mg/dL (ref 7.0–26.0)
CHLORIDE: 107 meq/L (ref 98–109)
CO2: 24 mEq/L (ref 22–29)
Calcium: 9.5 mg/dL (ref 8.4–10.4)
Creatinine: 1.1 mg/dL (ref 0.7–1.3)
EGFR: 75 mL/min/{1.73_m2} — AB (ref >90)
GLUCOSE: 121 mg/dL (ref 70–140)
Potassium: 3.6 mEq/L (ref 3.5–5.1)
Sodium: 141 mEq/L (ref 136–145)
Total Bilirubin: 0.67 mg/dL (ref 0.20–1.20)
Total Protein: 6.6 g/dL (ref 6.4–8.3)

## 2016-05-10 ENCOUNTER — Other Ambulatory Visit (HOSPITAL_BASED_OUTPATIENT_CLINIC_OR_DEPARTMENT_OTHER): Payer: Medicare Other

## 2016-05-10 ENCOUNTER — Ambulatory Visit (INDEPENDENT_AMBULATORY_CARE_PROVIDER_SITE_OTHER): Payer: Medicare Other | Admitting: Pulmonary Disease

## 2016-05-10 VITALS — BP 122/66 | HR 79 | Ht 73.0 in | Wt 239.6 lb

## 2016-05-10 DIAGNOSIS — R0600 Dyspnea, unspecified: Secondary | ICD-10-CM

## 2016-05-10 DIAGNOSIS — C3432 Malignant neoplasm of lower lobe, left bronchus or lung: Secondary | ICD-10-CM

## 2016-05-10 DIAGNOSIS — Z5111 Encounter for antineoplastic chemotherapy: Secondary | ICD-10-CM

## 2016-05-10 DIAGNOSIS — C3490 Malignant neoplasm of unspecified part of unspecified bronchus or lung: Secondary | ICD-10-CM

## 2016-05-10 LAB — COMPREHENSIVE METABOLIC PANEL
ALT: 15 U/L (ref 0–55)
AST: 18 U/L (ref 5–34)
Albumin: 3.3 g/dL — ABNORMAL LOW (ref 3.5–5.0)
Alkaline Phosphatase: 59 U/L (ref 40–150)
Anion Gap: 10 mEq/L (ref 3–11)
BILIRUBIN TOTAL: 0.31 mg/dL (ref 0.20–1.20)
BUN: 9.5 mg/dL (ref 7.0–26.0)
CO2: 25 meq/L (ref 22–29)
CREATININE: 1.1 mg/dL (ref 0.7–1.3)
Calcium: 9.5 mg/dL (ref 8.4–10.4)
Chloride: 107 mEq/L (ref 98–109)
EGFR: 73 mL/min/{1.73_m2} — ABNORMAL LOW (ref >90)
GLUCOSE: 141 mg/dL — AB (ref 70–140)
Potassium: 3.5 mEq/L (ref 3.5–5.1)
SODIUM: 142 meq/L (ref 136–145)
TOTAL PROTEIN: 6.5 g/dL (ref 6.4–8.3)

## 2016-05-10 LAB — CBC WITH DIFFERENTIAL/PLATELET
BASO%: 0.8 % (ref 0.0–2.0)
Basophils Absolute: 0 10*3/uL (ref 0.0–0.1)
EOS ABS: 0 10*3/uL (ref 0.0–0.5)
EOS%: 1.6 % (ref 0.0–7.0)
HCT: 34.7 % — ABNORMAL LOW (ref 38.4–49.9)
HEMOGLOBIN: 11.9 g/dL — AB (ref 13.0–17.1)
LYMPH#: 1.1 10*3/uL (ref 0.9–3.3)
LYMPH%: 42.7 % (ref 14.0–49.0)
MCH: 32.1 pg (ref 27.2–33.4)
MCHC: 34.3 g/dL (ref 32.0–36.0)
MCV: 93.5 fL (ref 79.3–98.0)
MONO#: 0.4 10*3/uL (ref 0.1–0.9)
MONO%: 16.1 % — AB (ref 0.0–14.0)
NEUT%: 38.8 % — ABNORMAL LOW (ref 39.0–75.0)
NEUTROS ABS: 1 10*3/uL — AB (ref 1.5–6.5)
NRBC: 0 % (ref 0–0)
PLATELETS: 239 10*3/uL (ref 140–400)
RBC: 3.71 10*6/uL — AB (ref 4.20–5.82)
RDW: 14.9 % — ABNORMAL HIGH (ref 11.0–14.6)
WBC: 2.5 10*3/uL — AB (ref 4.0–10.3)

## 2016-05-10 NOTE — Patient Instructions (Signed)
   Call me if you have any new breathing problems before your next appointment.  I will see you back in 3 months or sooner if needed.  TESTS ORDERED: 1. SPIROMETRY WITH DLCO at follow-up appointment 2. 6MWT on room air at next appointment

## 2016-05-10 NOTE — Progress Notes (Signed)
Subjective:    Patient ID: Matthew Berger, male    DOB: Nov 08, 1935, 80 y.o.   MRN: [id number]  C.C.:  Follow-up for Stage IV (T3, N2, M1b) NSCLC LLL & Dyspnea.  HPI Stage IV NSCLC LLL:  Adenocarcinoma on biopsy. Follows with medical oncology. Post 6 cycles of chemotherapy. They plan on repeating CT imaging at his follow-up appointment. No hemoptysis. No headaches. No focal weakness, numbness or tingling.   Dyspnea:  No evidence of COPD on spirometry. He reports his dyspnea is largely unchanged. He is currently on no inhaler medications. He continues to have a cough. Reports cough is only intermittently productive of a clear mucus.  Review of Systems He denies any chest tightness or pressure. Denies any palpitations. No fever, chills, or sweats. Reports he recently started to use a walker due to his "knees giving out on him". He does feel he lacks energy.   No Known Allergies  Current Outpatient Prescriptions on File Prior to Visit  Medication Sig Dispense Refill  . aspirin 81 MG tablet Take 81 mg by mouth daily.    Marland Kitchen atorvastatin (LIPITOR) 20 MG tablet Take 20 mg by mouth daily.    . Brinzolamide-Brimonidine (SIMBRINZA) 1-0.2 % SUSP Apply 2 drops to eye 2 (two) times daily.    Marland Kitchen diltiazem (CARDIZEM CD) 180 MG 24 hr capsule 1 (ONE) CAPSULE ER 24HR BY MOUTH DAILY  0  . dorzolamide (TRUSOPT) 2 % ophthalmic solution Place 1 drop into the left eye 2 (two) times daily.    . folic acid (FOLVITE) 1 MG tablet TAKE 1 TABLET (1 MG TOTAL) BY MOUTH DAILY.  0  . latanoprost (XALATAN) 0.005 % ophthalmic solution Place 1 drop into both eyes at bedtime.    . metoprolol tartrate (LOPRESSOR) 25 MG tablet Take 25 mg by mouth 2 (two) times daily.    . Multiple Vitamins-Minerals (MULTIVITAMIN WITH MINERALS) tablet Take 1 tablet by mouth daily.    . prochlorperazine (COMPAZINE) 10 MG tablet TAKE 1 TABLET BY MOUTH EVERY 6 HOURS AS NEEDED FOR NAUSEA AND VOMITING 30 tablet 0  . tamsulosin (FLOMAX) 0.4 MG CAPS  capsule Take 0.4 mg by mouth daily.     No current facility-administered medications on file prior to visit.     Past Medical History:  Diagnosis Date  . Atrial fibrillation ([facility])   . Encounter for antineoplastic chemotherapy 01/05/2016  . GERD (gastroesophageal reflux disease)   . Hypercholesteremia   . Hypertension   . Non-small cell lung cancer (NSCLC) ([facility]) 11/19/15  . Pneumonia 11/18/2015    Past Surgical History:  Procedure Laterality Date  . CATARACT EXTRACTION Bilateral   . ROTATOR CUFF REPAIR Right   . VIDEO BRONCHOSCOPY Bilateral 11/19/2015   Procedure: VIDEO BRONCHOSCOPY WITH FLUORO;  Surgeon: Rigoberto Noel, MD;  Location: [facility];  Service: Cardiopulmonary;  Laterality: Bilateral;  . WISDOM TOOTH EXTRACTION      Family History  Problem Relation Age of Onset  . Breast cancer Sister   . Stroke Sister   . Colon cancer Brother   . Cancer Maternal Aunt     x2  . Hypertension Mother   . Stroke Mother   . Breast cancer Cousin   . Diabetes Other   . Hypertension Other   . Hyperlipidemia Other     Social History   Social History  . Marital status: Married    Spouse name: N/A  . Number of children: Y8  . Years of education: N/A  Occupational History  . retired Administrator    Social History Main Topics  . Smoking status: Former Smoker    Packs/day: 2.00    Years: 40.00    Types: Cigarettes    Quit date: 10/02/1990  . Smokeless tobacco: Never Used     Comment: smoked 1-2 ppd.   . Alcohol use No  . Drug use: No  . Sexual activity: Not on file   Other Topics Concern  . Not on file   Social History Narrative   Originally from [place]. Always lived in [place]. Previously has traveled to [place], [place], [place], [place], [place], [place], [place], & [place]. Previously worked driving garbage truck. No pets currently. No known mold exposure.       Objective:   Physical Exam BP 122/66 (BP Location: Left Arm, Cuff Size: Normal)   Pulse 79   Ht '6\' 1"'$  (1.854 m)   Wt 239 lb 9.6 oz (108.7 kg)   SpO2  93%   BMI 31.61 kg/m  General:  Awake. Alert. Accompanied by daughter today. Integument:  Warm & dry. No rash on exposed skin.  Lymphatics:  No appreciated cervical or supraclavicular lymphadenoapthy. HEENT:  Moist mucus membranes. Mild bilateral nasal turbinate swelling. No oral ulcers.  Cardiovascular:  Regular rate. No edema. Normal S1 & S2.  Pulmonary:  Bronchial breath sounds left apex. Otherwise good aeration bilaterally. Normal work of breathing on room air. Neurological:  CN 2-12 grossly in tact. No meningismus. Moving all 4 extremities equally. Alert and oriented 4.   PFT 04/03/16: FVC 3.4 L (82%) FEV1 2.57 L (83%) FEV1/FVC 0.75 FEF 25-75 1.87 L (76%) no bronchodilator response TLC 6.28 L (81%) RV 93% ERV 106% DLCO corrected 55% (Hgb 13.0)  6MWT 05/10/16:  Walked 294 meters / Baseline Sat 98% on RA / Nadir Sat 90% on RA  IMAGING MRI BRAIN W/ & W/O 12/17/15 (per radiologist):  No intracranial abnormality to suggest metastasis.   CT CHEST W/ 11/19/15 (previously reviewed by me): Subcarinal lymph node as well as right hilar lymph node measuring over 1 cm in short axis. Enlarging bilateral parenchymal nodules as well as persistent medial left lower lobe consolidation. No pleural effusion or thickening. No pericardial effusion.  CT CHEST W/ 10/06/15 (previously reviewed by me): Medial left lower lobe consolidation with air bronchograms and some groundglass component. Satellite nodules noted within left lower lobe, lingula, and left upper lobe. Right upper lobe nodules with largest measuring approximately 1.3 cm. No pericardial effusion. No pleural effusion or thickening. No pathologic mediastinal adenopathy.  CXR PA/LAT 09/09/15 (previously reviewed by me): Left hilar opacity. No pleural effusion or thickening appreciated. Heart normal in size. Mediastinum normal in contour.  PATHOLOGY  Bronchial Wash LLL 11/19/15:  Malignant cells consistent with non-small cell carcinoma.  Bronchial Wash  RUL 11/19/15:  Atypical cells present. Bronchial Brush LLL 11/19/15: Malignant cells consistent with non-small cell carcinoma.  Endobronchial FNA/TBBx 11/19/15:  Adenocarcionma  MICROBIOLOGY BAL 11/19/15:  Oral Flora / AFB negative / Fungal negative  LABS 11/24/15 CBC:  5.9/13.1/38.2/170 BMP:  140/4.1/106/28/11/1.15/113/9.1 Magnesium:  2.1  09/15/15 CBC: 4.6/14.6/42.8/151 BMP: 139/3.8/107/25/17/1.18/119/8.8 LFT: 3.7/6.3/0.6/75/16/13 LDH: 131    Assessment & Plan:  80 y.o. male with Stage IV adenocarcinoma of the left lung. Suspect dyspnea is multifactorial but with 8% decrease in oxygen saturation with ambulation I do feel this is significant likely secondary to his underlying lung cancer. His spirometry shows no evidence of obstruction therefore I do not feel that inhaled medication therapy is necessary at  this time. I instructed the patient to notify me if he had any worsening symptoms or questions before his next appointment.  1. Dyspnea: Multifactorial. Patient did have significant desaturation during walk test today. Repeat 6 minute walk test on room air at next appointment. 2. Stage IV NSCLC: Follows with medical oncology. Completed 6 courses of chemotherapy. 3. Spirometry with DLCO next appointment. 4. Follow-up: Patient to return to clinic in 3 months or sooner if needed.  Sonia Baller Ashok Cordia, M.D. [facility] Pager:  [phone] After 3pm or if no response, call [phone] 11:53 AM 05/10/16

## 2016-05-10 NOTE — Progress Notes (Signed)
Test reviewed.  

## 2016-05-16 ENCOUNTER — Telehealth: Payer: Self-pay | Admitting: *Deleted

## 2016-05-16 ENCOUNTER — Encounter: Payer: Self-pay | Admitting: *Deleted

## 2016-05-16 NOTE — Telephone Encounter (Signed)
Oncology Nurse Navigator Documentation  Oncology Nurse Navigator Flowsheets 05/16/2016  Navigator Encounter Type Telephone/I received notification that Mr. Fackler scans have been aurthorized.  I called and spoke to Mr. Eifler and his sister to call central scheduling with an appt for his CT scan.    Telephone Outgoing Call  Treatment Phase Treatment  Barriers/Navigation Needs Coordination of Care  Interventions Coordination of Care  Coordination of Care Radiology  Acuity Level 2  Acuity Level 2 Assistance expediting appointments  Time Spent with Patient 30

## 2016-05-16 NOTE — Progress Notes (Signed)
Oncology Nurse Navigator Documentation  Oncology Nurse Navigator Flowsheets 05/16/2016  Navigator Encounter Type Other/I received a call that Matthew Berger scan has not been scheduled.  He needs scan before 8/23 when the patient sees Dr. [name].  I updated pre cert team regarding scan.  Once approved, I will follow up with central scheduling for a appt  Treatment Phase Treatment  Barriers/Navigation Needs Coordination of Care  Interventions Coordination of Care  Coordination of Care Appts  Acuity Level 2  Acuity Level 2 Assistance expediting appointments  Time Spent with Patient 30

## 2016-05-17 ENCOUNTER — Other Ambulatory Visit: Payer: Self-pay | Admitting: Medical Oncology

## 2016-05-17 ENCOUNTER — Other Ambulatory Visit: Payer: Medicare Other

## 2016-05-17 DIAGNOSIS — C3490 Malignant neoplasm of unspecified part of unspecified bronchus or lung: Secondary | ICD-10-CM

## 2016-05-19 ENCOUNTER — Telehealth: Payer: Self-pay | Admitting: Internal Medicine

## 2016-05-19 NOTE — Telephone Encounter (Signed)
spoke w/ [pt confirmed lab cahnge to 8/21 beforeCT

## 2016-05-22 ENCOUNTER — Ambulatory Visit (HOSPITAL_COMMUNITY)
Admission: RE | Admit: 2016-05-22 | Discharge: 2016-05-22 | Disposition: A | Discharge status: Home / Self Care | Payer: Medicare Other | Source: Ambulatory Visit | Attending: Internal Medicine | Admitting: Internal Medicine

## 2016-05-22 ENCOUNTER — Other Ambulatory Visit (HOSPITAL_BASED_OUTPATIENT_CLINIC_OR_DEPARTMENT_OTHER): Payer: Medicare Other

## 2016-05-22 ENCOUNTER — Encounter (HOSPITAL_COMMUNITY): Payer: Self-pay

## 2016-05-22 DIAGNOSIS — Z5111 Encounter for antineoplastic chemotherapy: Secondary | ICD-10-CM

## 2016-05-22 DIAGNOSIS — C3432 Malignant neoplasm of lower lobe, left bronchus or lung: Secondary | ICD-10-CM

## 2016-05-22 DIAGNOSIS — M899 Disorder of bone, unspecified: Secondary | ICD-10-CM | POA: Diagnosis not present

## 2016-05-22 DIAGNOSIS — C3490 Malignant neoplasm of unspecified part of unspecified bronchus or lung: Secondary | ICD-10-CM | POA: Insufficient documentation

## 2016-05-22 DIAGNOSIS — R918 Other nonspecific abnormal finding of lung field: Secondary | ICD-10-CM | POA: Diagnosis not present

## 2016-05-22 DIAGNOSIS — M5380 Other specified dorsopathies, site unspecified: Secondary | ICD-10-CM | POA: Insufficient documentation

## 2016-05-22 LAB — CBC WITH DIFFERENTIAL/PLATELET
BASO%: 0.8 % (ref 0.0–2.0)
Basophils Absolute: 0 10*3/uL (ref 0.0–0.1)
EOS%: 0.7 % (ref 0.0–7.0)
Eosinophils Absolute: 0 10*3/uL (ref 0.0–0.5)
HCT: 39.3 % (ref 38.4–49.9)
HEMOGLOBIN: 12.9 g/dL — AB (ref 13.0–17.1)
LYMPH#: 1.1 10*3/uL (ref 0.9–3.3)
LYMPH%: 20.7 % (ref 14.0–49.0)
MCH: 31.5 pg (ref 27.2–33.4)
MCHC: 32.7 g/dL (ref 32.0–36.0)
MCV: 96.2 fL (ref 79.3–98.0)
MONO#: 0.7 10*3/uL (ref 0.1–0.9)
MONO%: 13.6 % (ref 0.0–14.0)
NEUT%: 64.2 % (ref 39.0–75.0)
NEUTROS ABS: 3.3 10*3/uL (ref 1.5–6.5)
Platelets: 123 10*3/uL — ABNORMAL LOW (ref 140–400)
RBC: 4.09 10*6/uL — AB (ref 4.20–5.82)
RDW: 15.5 % — AB (ref 11.0–14.6)
WBC: 5.2 10*3/uL (ref 4.0–10.3)

## 2016-05-22 LAB — COMPREHENSIVE METABOLIC PANEL
ALT: 11 U/L (ref 0–55)
AST: 16 U/L (ref 5–34)
Albumin: 3.4 g/dL — ABNORMAL LOW (ref 3.5–5.0)
Alkaline Phosphatase: 63 U/L (ref 40–150)
Anion Gap: 9 mEq/L (ref 3–11)
BILIRUBIN TOTAL: 0.58 mg/dL (ref 0.20–1.20)
BUN: 12.1 mg/dL (ref 7.0–26.0)
CO2: 24 meq/L (ref 22–29)
Calcium: 9.9 mg/dL (ref 8.4–10.4)
Chloride: 110 mEq/L — ABNORMAL HIGH (ref 98–109)
Creatinine: 1.2 mg/dL (ref 0.7–1.3)
EGFR: 65 mL/min/{1.73_m2} — AB (ref >90)
GLUCOSE: 107 mg/dL (ref 70–140)
Potassium: 3.9 mEq/L (ref 3.5–5.1)
SODIUM: 143 meq/L (ref 136–145)
TOTAL PROTEIN: 6.8 g/dL (ref 6.4–8.3)

## 2016-05-22 MED ORDER — IOPAMIDOL (ISOVUE-300) INJECTION 61%
100.0000 mL | Freq: Once | INTRAVENOUS | Status: AC | PRN
  Administered 2016-05-22: 100 mL via INTRAVENOUS

## 2016-05-24 ENCOUNTER — Telehealth: Payer: Self-pay | Admitting: Internal Medicine

## 2016-05-24 ENCOUNTER — Ambulatory Visit (HOSPITAL_BASED_OUTPATIENT_CLINIC_OR_DEPARTMENT_OTHER): Payer: Medicare Other | Admitting: Internal Medicine

## 2016-05-24 ENCOUNTER — Encounter: Payer: Self-pay | Admitting: Internal Medicine

## 2016-05-24 ENCOUNTER — Other Ambulatory Visit: Payer: Medicare Other

## 2016-05-24 VITALS — BP 138/62 | HR 88 | Temp 97.9°F | Resp 17 | Ht 73.0 in | Wt 242.0 lb

## 2016-05-24 DIAGNOSIS — C3432 Malignant neoplasm of lower lobe, left bronchus or lung: Secondary | ICD-10-CM

## 2016-05-24 DIAGNOSIS — Z5111 Encounter for antineoplastic chemotherapy: Secondary | ICD-10-CM

## 2016-05-24 DIAGNOSIS — R5383 Other fatigue: Secondary | ICD-10-CM | POA: Diagnosis not present

## 2016-05-24 DIAGNOSIS — C3490 Malignant neoplasm of unspecified part of unspecified bronchus or lung: Secondary | ICD-10-CM

## 2016-05-24 NOTE — Telephone Encounter (Signed)
GAVE PATIENT AVS REPORT AND APPOINTMENTS FOR AUGUST THRU October.

## 2016-05-24 NOTE — Progress Notes (Signed)
[place] [phone]   [phone]) [phone]  OFFICE PROGRESS NOTE  [name], MD [address] [place] [address]  DIAGNOSIS: Stage IV (T3, N2, M1b) non-small cell lung cancer, adenocarcinoma with negative EGFR mutation, negative ALK gene translocation, negative ROS 1, presented with large left lower lobe consolidative mass in addition to bilateral pulmonary nodules, right hilar and subcarinal lymphadenopathy as well as suspicious bony lesion diagnosed in February 2017.  PRIOR THERAPY: Systemic chemotherapy with carboplatin for AUC of 5 and Alimta 500 MG/M2 every 3 weeks. First dose 12/29/2015. Status post 6 cycles. Last dose was given 04/19/2016 with stable disease.  CURRENT THERAPY: Maintenance systemic chemotherapy with single agent Alimta 500 MG/M2 every 3 weeks. First dose 05/31/2016.  INTERVAL HISTORY: Matthew Berger 80 y.o. male returns to the clinic today for follow-up visit accompanied by several family members. The patient is feeling fine today with no specific complaints. He tolerated the last cycle of his systemic chemotherapy with carboplatin and Alimta fairly well except for fatigue. He denied having any significant fever or chills. He has no chest pain, shortness of breath except with exertion, cough or hemoptysis. He had repeat CT scan of the chest, abdomen and pelvis performed recently and he is here for evaluation and discussion of his scan results.  MEDICAL HISTORY: Past Medical History:  Diagnosis Date  . Atrial fibrillation ([place])   . Encounter for antineoplastic chemotherapy 01/05/2016  . GERD (gastroesophageal reflux disease)   . Hypercholesteremia   . Hypertension   . Non-small cell lung cancer (NSCLC) ([place]) 11/19/15  . Pneumonia 11/18/2015    ALLERGIES:  has No Known Allergies.  MEDICATIONS:  Current Outpatient Prescriptions  Medication Sig Dispense Refill  . aspirin 81 MG tablet Take 81 mg by mouth daily.    Marland Kitchen  atorvastatin (LIPITOR) 20 MG tablet Take 20 mg by mouth daily.    . Brinzolamide-Brimonidine (SIMBRINZA) 1-0.2 % SUSP Apply 2 drops to eye 2 (two) times daily.    Marland Kitchen diltiazem (CARDIZEM CD) 180 MG 24 hr capsule 1 (ONE) CAPSULE ER 24HR BY MOUTH DAILY  0  . dorzolamide (TRUSOPT) 2 % ophthalmic solution Place 1 drop into the left eye 2 (two) times daily.    . folic acid (FOLVITE) 1 MG tablet TAKE 1 TABLET (1 MG TOTAL) BY MOUTH DAILY.  0  . latanoprost (XALATAN) 0.005 % ophthalmic solution Place 1 drop into both eyes at bedtime.    . metoprolol tartrate (LOPRESSOR) 25 MG tablet Take 25 mg by mouth 2 (two) times daily.    . Multiple Vitamins-Minerals (MULTIVITAMIN WITH MINERALS) tablet Take 1 tablet by mouth daily.    . prochlorperazine (COMPAZINE) 10 MG tablet TAKE 1 TABLET BY MOUTH EVERY 6 HOURS AS NEEDED FOR NAUSEA AND VOMITING 30 tablet 0  . tamsulosin (FLOMAX) 0.4 MG CAPS capsule Take 0.4 mg by mouth daily.    . vitamin B-12 (CYANOCOBALAMIN) 1000 MCG tablet Take 1,000 mcg by mouth daily.     No current facility-administered medications for this visit.     SURGICAL HISTORY:  Past Surgical History:  Procedure Laterality Date  . CATARACT EXTRACTION Bilateral   . ROTATOR CUFF REPAIR Right   . VIDEO BRONCHOSCOPY Bilateral 11/19/2015   Procedure: VIDEO BRONCHOSCOPY WITH FLUORO;  Surgeon: Rigoberto Noel, MD;  Location: [facility];  Service: Cardiopulmonary;  Laterality: Bilateral;  . WISDOM TOOTH EXTRACTION      REVIEW OF SYSTEMS:  Constitutional: positive for fatigue Eyes: negative Ears,  nose, mouth, throat, and face: negative Respiratory: negative Cardiovascular: negative Gastrointestinal: negative Genitourinary:negative Integument/breast: negative Hematologic/lymphatic: negative Musculoskeletal:negative Neurological: negative Behavioral/Psych: negative Endocrine: negative Allergic/Immunologic: negative   PHYSICAL EXAMINATION: General appearance: alert, cooperative and no  distress Head: Normocephalic, without obvious abnormality, atraumatic Neck: no adenopathy, no JVD, supple, symmetrical, trachea midline and thyroid not enlarged, symmetric, no tenderness/mass/nodules Lymph nodes: Cervical, supraclavicular, and axillary nodes normal. Resp: clear to auscultation bilaterally Back: symmetric, no curvature. ROM normal. No CVA tenderness. Cardio: regular rate and rhythm, S1, S2 normal, no murmur, click, rub or gallop GI: soft, non-tender; bowel sounds normal; no masses,  no organomegaly Extremities: extremities normal, atraumatic, no cyanosis or edema Neurologic: Alert and oriented X 3, normal strength and tone. Normal symmetric reflexes. Normal coordination and gait  ECOG PERFORMANCE STATUS: 1 - Symptomatic but completely ambulatory  Blood pressure 138/62, pulse 88, temperature 97.9 F (36.6 C), temperature source Oral, resp. rate 17, height '6\' 1"'$  (1.854 m), weight 242 lb (109.8 kg), SpO2 96 %.  LABORATORY DATA: Lab Results  Component Value Date   WBC 5.2 05/22/2016   HGB 12.9 (L) 05/22/2016   HCT 39.3 05/22/2016   MCV 96.2 05/22/2016   PLT 123 (L) 05/22/2016      Chemistry      Component Value Date/Time   NA 143 05/22/2016 0935   K 3.9 05/22/2016 0935   CL 106 11/24/2015 0453   CO2 24 05/22/2016 0935   BUN 12.1 05/22/2016 0935   CREATININE 1.2 05/22/2016 0935      Component Value Date/Time   CALCIUM 9.9 05/22/2016 0935   ALKPHOS 63 05/22/2016 0935   AST 16 05/22/2016 0935   ALT 11 05/22/2016 0935   BILITOT 0.58 05/22/2016 0935       RADIOGRAPHIC STUDIES: Ct Chest W Contrast  Result Date: 05/22/2016 CLINICAL DATA:  Subsequent treatment strategy for lung carcinoma. Chemotherapy ongoing. Stage IV (T3, N2, M1b) non-small cell lung cancer, adenocarcinoma with negative EGFR mutation, negative ALK gene translocation, negative ROS 1, EXAM: CT CHEST, ABDOMEN, AND PELVIS WITH CONTRAST TECHNIQUE: Multidetector CT imaging of the chest, abdomen and  pelvis was performed following the standard protocol during bolus administration of intravenous contrast. CONTRAST:  172m ISOVUE-300 IOPAMIDOL (ISOVUE-300) INJECTION 61% COMPARISON:  CT 02/23/2016, PET-CT 12/10/2015 FINDINGS: CT CHEST FINDINGS Cardiovascular: Coronary artery calcification and aortic atherosclerotic calcification. No pericardial fluid. No central pulmonary embolism Mediastinum/Nodes: No axillary or supraclavicular lymphadenopathy. No mediastinal hilar adenopathy. Lungs/Pleura: Infrahilar consolidation and multiple solid and semi-solid nodules in the LEFT lower lobe measure slightly larger. For example: 17 mm nodule (image 129, series 4 compares to 15 mm. Nodule just above the diaphragm in the LEFT lower lobe measures 7 mm (image 124, series 4) compared to 5 mm. Inferior lingular nodule measures 6 mm compared to 5 mm. Within the RIGHT lung, nodule in the RIGHT middle lobe anterior to the oblique fissure measures 7 mm compared to 6 mm (image 86, series 4) 17 mm RIGHT upper lobe nodule image 49, series 4 compares to 16 mm. Musculoskeletal: No aggressive osseous lesion. CT ABDOMEN AND PELVIS FINDINGS Hepatobiliary: No focal hepatic lesion. No biliary ductal dilatation. Gallbladder is normal. Common bile duct is normal. Pancreas: Pancreas is normal. No ductal dilatation. No pancreatic inflammation. Spleen: Normal spleen Adrenals/urinary tract: Adrenal glands and kidneys are normal. The ureters and bladder normal. Stomach/Bowel: Stomach, small bowel, appendix, and cecum are normal. The colon and rectosigmoid colon are normal. Vascular/Lymphatic: Abdominal aorta is normal caliber. There is no retroperitoneal or periportal  lymphadenopathy. No pelvic lymphadenopathy. Reproductive: Prostate hypertrophy Other: No peritoneal disease Musculoskeletal: Stable sclerotic lesions at L1 and T 9. IMPRESSION: Chest Impression: 1. Subtle interval increase in bilateral solid and semi-solid nodules (1 mm) is concerning for  mild disease progression. Recommend continued surveillance. 2. No mediastinal lymphadenopathy. Abdomen / Pelvis Impression: 1. No evidence of metastatic disease in the soft tissues abdomen or pelvis. 2. Stable sclerotic lesions in the spine. Electronically Signed   By: Suzy Bouchard M.D.   On: 05/22/2016 12:54   Ct Abdomen Pelvis W Contrast  Result Date: 05/22/2016 CLINICAL DATA:  Subsequent treatment strategy for lung carcinoma. Chemotherapy ongoing. Stage IV (T3, N2, M1b) non-small cell lung cancer, adenocarcinoma with negative EGFR mutation, negative ALK gene translocation, negative ROS 1, EXAM: CT CHEST, ABDOMEN, AND PELVIS WITH CONTRAST TECHNIQUE: Multidetector CT imaging of the chest, abdomen and pelvis was performed following the standard protocol during bolus administration of intravenous contrast. CONTRAST:  123m ISOVUE-300 IOPAMIDOL (ISOVUE-300) INJECTION 61% COMPARISON:  CT 02/23/2016, PET-CT 12/10/2015 FINDINGS: CT CHEST FINDINGS Cardiovascular: Coronary artery calcification and aortic atherosclerotic calcification. No pericardial fluid. No central pulmonary embolism Mediastinum/Nodes: No axillary or supraclavicular lymphadenopathy. No mediastinal hilar adenopathy. Lungs/Pleura: Infrahilar consolidation and multiple solid and semi-solid nodules in the LEFT lower lobe measure slightly larger. For example: 17 mm nodule (image 129, series 4 compares to 15 mm. Nodule just above the diaphragm in the LEFT lower lobe measures 7 mm (image 124, series 4) compared to 5 mm. Inferior lingular nodule measures 6 mm compared to 5 mm. Within the RIGHT lung, nodule in the RIGHT middle lobe anterior to the oblique fissure measures 7 mm compared to 6 mm (image 86, series 4) 17 mm RIGHT upper lobe nodule image 49, series 4 compares to 16 mm. Musculoskeletal: No aggressive osseous lesion. CT ABDOMEN AND PELVIS FINDINGS Hepatobiliary: No focal hepatic lesion. No biliary ductal dilatation. Gallbladder is normal. Common  bile duct is normal. Pancreas: Pancreas is normal. No ductal dilatation. No pancreatic inflammation. Spleen: Normal spleen Adrenals/urinary tract: Adrenal glands and kidneys are normal. The ureters and bladder normal. Stomach/Bowel: Stomach, small bowel, appendix, and cecum are normal. The colon and rectosigmoid colon are normal. Vascular/Lymphatic: Abdominal aorta is normal caliber. There is no retroperitoneal or periportal lymphadenopathy. No pelvic lymphadenopathy. Reproductive: Prostate hypertrophy Other: No peritoneal disease Musculoskeletal: Stable sclerotic lesions at L1 and T 9. IMPRESSION: Chest Impression: 1. Subtle interval increase in bilateral solid and semi-solid nodules (1 mm) is concerning for mild disease progression. Recommend continued surveillance. 2. No mediastinal lymphadenopathy. Abdomen / Pelvis Impression: 1. No evidence of metastatic disease in the soft tissues abdomen or pelvis. 2. Stable sclerotic lesions in the spine. Electronically Signed   By: SSuzy BouchardM.D.   On: 05/22/2016 12:54    ASSESSMENT AND PLAN: This is a very pleasant 80years old African-American male with metastatic non-small cell lung cancer, adenocarcinoma. The molecular study showed no actionable mutations. The patient is currently undergoing systemic chemotherapy with carboplatin and Alimta status post 6 cycles. He tolerated the last cycle of his treatment fairly well with no significant complaints except for mild fatigue. The recent CT scan of the chest, abdomen and pelvis showed in general stable disease except for mild increase in size of bilateral per my nodules by a few millimeters. I discussed the scan results with the patient his family. I gave him the option of continuous observation and close monitoring versus consideration of maintenance systemic chemotherapy with single agent Alimta 500 MG/M2 every 3  weeks. The patient is interested in the maintenance treatment and I discussed with him the  adverse effect of this treatment. He is expected to start the first dose of this treatment on 05/31/2016. I will see him back for follow-up visit in 4 weeks with the start of cycle #2. He was advised to call immediately if he has any concerning symptoms in the interval. The patient voices understanding of current disease status and treatment options and is in agreement with the current care plan.  All questions were answered. The patient knows to call the clinic with any problems, questions or concerns. We can certainly see the patient much sooner if necessary.  Disclaimer: This note was dictated with voice recognition software. Similar sounding words can inadvertently be transcribed and may not be corrected upon review.

## 2016-05-31 ENCOUNTER — Other Ambulatory Visit (HOSPITAL_BASED_OUTPATIENT_CLINIC_OR_DEPARTMENT_OTHER): Payer: Medicare Other

## 2016-05-31 ENCOUNTER — Ambulatory Visit (HOSPITAL_BASED_OUTPATIENT_CLINIC_OR_DEPARTMENT_OTHER): Payer: Medicare Other

## 2016-05-31 VITALS — BP 121/80 | HR 78 | Temp 98.4°F | Resp 17

## 2016-05-31 DIAGNOSIS — C3432 Malignant neoplasm of lower lobe, left bronchus or lung: Secondary | ICD-10-CM

## 2016-05-31 DIAGNOSIS — C3490 Malignant neoplasm of unspecified part of unspecified bronchus or lung: Secondary | ICD-10-CM

## 2016-05-31 DIAGNOSIS — Z5111 Encounter for antineoplastic chemotherapy: Secondary | ICD-10-CM | POA: Diagnosis not present

## 2016-05-31 LAB — CBC WITH DIFFERENTIAL/PLATELET
BASO%: 0.4 % (ref 0.0–2.0)
Basophils Absolute: 0.1 10*3/uL (ref 0.0–0.1)
EOS%: 0 % (ref 0.0–7.0)
Eosinophils Absolute: 0 10*3/uL (ref 0.0–0.5)
HEMATOCRIT: 38.9 % (ref 38.4–49.9)
HGB: 12.8 g/dL — ABNORMAL LOW (ref 13.0–17.1)
LYMPH#: 0.5 10*3/uL — AB (ref 0.9–3.3)
LYMPH%: 3.6 % — ABNORMAL LOW (ref 14.0–49.0)
MCH: 31.5 pg (ref 27.2–33.4)
MCHC: 33 g/dL (ref 32.0–36.0)
MCV: 95.5 fL (ref 79.3–98.0)
MONO#: 0.2 10*3/uL (ref 0.1–0.9)
MONO%: 1.6 % (ref 0.0–14.0)
NEUT%: 94.4 % — AB (ref 39.0–75.0)
NEUTROS ABS: 11.9 10*3/uL — AB (ref 1.5–6.5)
PLATELETS: 130 10*3/uL — AB (ref 140–400)
RBC: 4.07 10*6/uL — ABNORMAL LOW (ref 4.20–5.82)
RDW: 15.3 % — AB (ref 11.0–14.6)
WBC: 12.7 10*3/uL — ABNORMAL HIGH (ref 4.0–10.3)

## 2016-05-31 LAB — COMPREHENSIVE METABOLIC PANEL
ALT: 11 U/L (ref 0–55)
ANION GAP: 12 meq/L — AB (ref 3–11)
AST: 15 U/L (ref 5–34)
Albumin: 3.4 g/dL — ABNORMAL LOW (ref 3.5–5.0)
Alkaline Phosphatase: 64 U/L (ref 40–150)
BILIRUBIN TOTAL: 0.6 mg/dL (ref 0.20–1.20)
BUN: 13.7 mg/dL (ref 7.0–26.0)
CALCIUM: 9.5 mg/dL (ref 8.4–10.4)
CO2: 19 mEq/L — ABNORMAL LOW (ref 22–29)
CREATININE: 1.2 mg/dL (ref 0.7–1.3)
Chloride: 108 mEq/L (ref 98–109)
EGFR: 69 mL/min/{1.73_m2} — ABNORMAL LOW (ref >90)
Glucose: 201 mg/dl — ABNORMAL HIGH (ref 70–140)
Potassium: 4.1 mEq/L (ref 3.5–5.1)
Sodium: 138 mEq/L (ref 136–145)
TOTAL PROTEIN: 6.7 g/dL (ref 6.4–8.3)

## 2016-05-31 MED ORDER — SODIUM CHLORIDE 0.9 % IV SOLN
500.0000 mg/m2 | Freq: Once | INTRAVENOUS | Status: AC
  Administered 2016-05-31: 1200 mg via INTRAVENOUS
  Filled 2016-05-31: qty 40

## 2016-05-31 MED ORDER — PROCHLORPERAZINE MALEATE 10 MG PO TABS
10.0000 mg | ORAL_TABLET | Freq: Once | ORAL | Status: AC
  Administered 2016-05-31: 10 mg via ORAL

## 2016-05-31 MED ORDER — SODIUM CHLORIDE 0.9 % IV SOLN
Freq: Once | INTRAVENOUS | Status: AC
  Administered 2016-05-31: 14:00:00 via INTRAVENOUS

## 2016-05-31 MED ORDER — PROCHLORPERAZINE MALEATE 10 MG PO TABS
ORAL_TABLET | ORAL | Status: AC
  Filled 2016-05-31: qty 1

## 2016-05-31 NOTE — Patient Instructions (Signed)
[facility] Discharge Instructions for Patients Receiving Chemotherapy  Today you received the following chemotherapy agents; Alimta.   To help prevent nausea and vomiting after your treatment, we encourage you to take your nausea medication as directed.    If you develop nausea and vomiting that is not controlled by your nausea medication, call the clinic.   BELOW ARE SYMPTOMS THAT SHOULD BE REPORTED IMMEDIATELY:  *FEVER GREATER THAN 100.5 F  *CHILLS WITH OR WITHOUT FEVER  NAUSEA AND VOMITING THAT IS NOT CONTROLLED WITH YOUR NAUSEA MEDICATION  *UNUSUAL SHORTNESS OF BREATH  *UNUSUAL BRUISING OR BLEEDING  TENDERNESS IN MOUTH AND THROAT WITH OR WITHOUT PRESENCE OF ULCERS  *URINARY PROBLEMS  *BOWEL PROBLEMS  UNUSUAL RASH Items with * indicate a potential emergency and should be followed up as soon as possible.  Feel free to call the clinic you have any questions or concerns. The clinic phone number is [phone].  Please show the CHEMO ALERT CARD at check-in to the Emergency Department and triage nurse.   

## 2016-06-21 ENCOUNTER — Telehealth: Payer: Self-pay | Admitting: *Deleted

## 2016-06-21 ENCOUNTER — Telehealth: Payer: Self-pay | Admitting: Internal Medicine

## 2016-06-21 ENCOUNTER — Ambulatory Visit (HOSPITAL_BASED_OUTPATIENT_CLINIC_OR_DEPARTMENT_OTHER): Payer: Medicare Other | Admitting: Internal Medicine

## 2016-06-21 ENCOUNTER — Encounter: Payer: Self-pay | Admitting: Internal Medicine

## 2016-06-21 ENCOUNTER — Ambulatory Visit (HOSPITAL_BASED_OUTPATIENT_CLINIC_OR_DEPARTMENT_OTHER): Payer: Medicare Other

## 2016-06-21 ENCOUNTER — Other Ambulatory Visit (HOSPITAL_BASED_OUTPATIENT_CLINIC_OR_DEPARTMENT_OTHER): Payer: Medicare Other

## 2016-06-21 VITALS — BP 134/63 | HR 88 | Temp 97.8°F | Resp 17 | Ht 73.0 in | Wt 242.2 lb

## 2016-06-21 DIAGNOSIS — C3490 Malignant neoplasm of unspecified part of unspecified bronchus or lung: Secondary | ICD-10-CM

## 2016-06-21 DIAGNOSIS — C3432 Malignant neoplasm of lower lobe, left bronchus or lung: Secondary | ICD-10-CM

## 2016-06-21 DIAGNOSIS — Z5111 Encounter for antineoplastic chemotherapy: Secondary | ICD-10-CM

## 2016-06-21 LAB — CBC WITH DIFFERENTIAL/PLATELET
BASO%: 0 % (ref 0.0–2.0)
BASOS ABS: 0 10*3/uL (ref 0.0–0.1)
EOS ABS: 0 10*3/uL (ref 0.0–0.5)
EOS%: 0 % (ref 0.0–7.0)
HCT: 34.1 % — ABNORMAL LOW (ref 38.4–49.9)
HEMOGLOBIN: 11.8 g/dL — AB (ref 13.0–17.1)
LYMPH%: 6.2 % — AB (ref 14.0–49.0)
MCH: 31.7 pg (ref 27.2–33.4)
MCHC: 34.6 g/dL (ref 32.0–36.0)
MCV: 91.7 fL (ref 79.3–98.0)
MONO#: 0.1 10*3/uL (ref 0.1–0.9)
MONO%: 2.6 % (ref 0.0–14.0)
NEUT#: 4.8 10*3/uL (ref 1.5–6.5)
NEUT%: 91.2 % — AB (ref 39.0–75.0)
Platelets: 195 10*3/uL (ref 140–400)
RBC: 3.72 10*6/uL — AB (ref 4.20–5.82)
RDW: 14.2 % (ref 11.0–14.6)
WBC: 5.3 10*3/uL (ref 4.0–10.3)
lymph#: 0.3 10*3/uL — ABNORMAL LOW (ref 0.9–3.3)

## 2016-06-21 LAB — COMPREHENSIVE METABOLIC PANEL
ALBUMIN: 3.3 g/dL — AB (ref 3.5–5.0)
ALK PHOS: 69 U/L (ref 40–150)
ALT: 17 U/L (ref 0–55)
AST: 17 U/L (ref 5–34)
Anion Gap: 11 mEq/L (ref 3–11)
BUN: 17 mg/dL (ref 7.0–26.0)
CHLORIDE: 109 meq/L (ref 98–109)
CO2: 19 mEq/L — ABNORMAL LOW (ref 22–29)
Calcium: 9.4 mg/dL (ref 8.4–10.4)
Creatinine: 1.4 mg/dL — ABNORMAL HIGH (ref 0.7–1.3)
EGFR: 57 mL/min/{1.73_m2} — AB (ref >90)
GLUCOSE: 246 mg/dL — AB (ref 70–140)
POTASSIUM: 4.2 meq/L (ref 3.5–5.1)
SODIUM: 139 meq/L (ref 136–145)
Total Bilirubin: 0.43 mg/dL (ref 0.20–1.20)
Total Protein: 6.7 g/dL (ref 6.4–8.3)

## 2016-06-21 MED ORDER — SODIUM CHLORIDE 0.9 % IV SOLN
500.0000 mg/m2 | Freq: Once | INTRAVENOUS | Status: AC
  Administered 2016-06-21: 1200 mg via INTRAVENOUS
  Filled 2016-06-21: qty 40

## 2016-06-21 MED ORDER — SODIUM CHLORIDE 0.9 % IV SOLN
Freq: Once | INTRAVENOUS | Status: AC
Start: 2016-06-21 — End: 2016-06-21
  Administered 2016-06-21: 13:00:00 via INTRAVENOUS

## 2016-06-21 MED ORDER — PROCHLORPERAZINE MALEATE 10 MG PO TABS
10.0000 mg | ORAL_TABLET | Freq: Once | ORAL | Status: AC
  Administered 2016-06-21: 10 mg via ORAL

## 2016-06-21 MED ORDER — PROCHLORPERAZINE MALEATE 10 MG PO TABS
ORAL_TABLET | ORAL | Status: AC
  Filled 2016-06-21: qty 1

## 2016-06-21 NOTE — Progress Notes (Signed)
[place] [phone]   Fax:([phone]  OFFICE PROGRESS NOTE  [name], MD [address] [place] [address]  DIAGNOSIS: Stage IV (T3, N2, M1b) non-small cell lung cancer, adenocarcinoma with negative EGFR mutation, negative ALK gene translocation, negative ROS 1, presented with large left lower lobe consolidative mass in addition to bilateral pulmonary nodules, right hilar and subcarinal lymphadenopathy as well as suspicious bony lesion diagnosed in February 2017.  PRIOR THERAPY: Systemic chemotherapy with carboplatin for AUC of 5 and Alimta 500 MG/M2 every 3 weeks. First dose 12/29/2015. Status post 6 cycles. Last dose was given 04/19/2016 with stable disease.  CURRENT THERAPY: Maintenance systemic chemotherapy with single agent Alimta 500 MG/M2 every 3 weeks. First dose 05/31/2016. Status post one cycle.  INTERVAL HISTORY: Matthew Berger 80 y.o. male returns to the clinic today for follow-up visit accompanied by several family members. The patient is feeling fine today with no specific complaints. He is currently on maintenance systemic chemotherapy with single agent Alimta status post 1 cycle and tolerated the first cycle of his treatment well.  He continues to have fatigue. He denied having any significant fever or chills. He has no chest pain, shortness of breath except with exertion, cough or hemoptysis. He is here today for evaluation before starting cycle #2.  MEDICAL HISTORY: Past Medical History:  Diagnosis Date  . Atrial fibrillation ([place])   . Encounter for antineoplastic chemotherapy 01/05/2016  . GERD (gastroesophageal reflux disease)   . Hypercholesteremia   . Hypertension   . Non-small cell lung cancer (NSCLC) ([place]) 11/19/15  . Pneumonia 11/18/2015    ALLERGIES:  has No Known Allergies.  MEDICATIONS:  Current Outpatient Prescriptions  Medication Sig Dispense Refill  . aspirin 81 MG tablet Take 81 mg by mouth daily.    Marland Kitchen  atorvastatin (LIPITOR) 20 MG tablet Take 20 mg by mouth daily.    . Brinzolamide-Brimonidine (SIMBRINZA) 1-0.2 % SUSP Apply 2 drops to eye 2 (two) times daily.    Marland Kitchen dexamethasone (DECADRON) 4 MG tablet TAKE 1 TABLET BY MOUTH TWICE A DAY WITH A MEAL  1  . diltiazem (CARDIZEM CD) 180 MG 24 hr capsule 1 (ONE) CAPSULE ER 24HR BY MOUTH DAILY  0  . dorzolamide (TRUSOPT) 2 % ophthalmic solution Place 1 drop into the left eye 2 (two) times daily.    . folic acid (FOLVITE) 1 MG tablet TAKE 1 TABLET (1 MG TOTAL) BY MOUTH DAILY.  0  . latanoprost (XALATAN) 0.005 % ophthalmic solution Place 1 drop into both eyes at bedtime.    . metoprolol tartrate (LOPRESSOR) 25 MG tablet Take 25 mg by mouth 2 (two) times daily.    . Multiple Vitamins-Minerals (MULTIVITAMIN WITH MINERALS) tablet Take 1 tablet by mouth daily.    . prochlorperazine (COMPAZINE) 10 MG tablet TAKE 1 TABLET BY MOUTH EVERY 6 HOURS AS NEEDED FOR NAUSEA AND VOMITING 30 tablet 0  . tamsulosin (FLOMAX) 0.4 MG CAPS capsule Take 0.4 mg by mouth daily.    . vitamin B-12 (CYANOCOBALAMIN) 1000 MCG tablet Take 1,000 mcg by mouth daily.     No current facility-administered medications for this visit.     SURGICAL HISTORY:  Past Surgical History:  Procedure Laterality Date  . CATARACT EXTRACTION Bilateral   . ROTATOR CUFF REPAIR Right   . VIDEO BRONCHOSCOPY Bilateral 11/19/2015   Procedure: VIDEO BRONCHOSCOPY WITH FLUORO;  Surgeon: Rigoberto Noel, MD;  Location: [facility];  Service: Cardiopulmonary;  Laterality: Bilateral;  .  WISDOM TOOTH EXTRACTION      REVIEW OF SYSTEMS:  A comprehensive review of systems was negative except for: Constitutional: positive for fatigue   PHYSICAL EXAMINATION: General appearance: alert, cooperative and no distress Head: Normocephalic, without obvious abnormality, atraumatic Neck: no adenopathy, no JVD, supple, symmetrical, trachea midline and thyroid not enlarged, symmetric, no tenderness/mass/nodules Lymph nodes:  Cervical, supraclavicular, and axillary nodes normal. Resp: clear to auscultation bilaterally Back: symmetric, no curvature. ROM normal. No CVA tenderness. Cardio: regular rate and rhythm, S1, S2 normal, no murmur, click, rub or gallop GI: soft, non-tender; bowel sounds normal; no masses,  no organomegaly Extremities: extremities normal, atraumatic, no cyanosis or edema Neurologic: Alert and oriented X 3, normal strength and tone. Normal symmetric reflexes. Normal coordination and gait  ECOG PERFORMANCE STATUS: 1 - Symptomatic but completely ambulatory  Blood pressure 134/63, pulse 88, temperature 97.8 F (36.6 C), temperature source Oral, resp. rate 17, height _0  (1.854 m), weight 242 lb 3.2 oz (109.9 kg), SpO2 94 %.  LABORATORY DATA: Lab Results  Component Value Date   WBC 5.3 06/21/2016   HGB 11.8 (L) 06/21/2016   HCT 34.1 (L) 06/21/2016   MCV 91.7 06/21/2016   PLT 195 06/21/2016      Chemistry      Component Value Date/Time   NA 139 06/21/2016 1039   K 4.2 06/21/2016 1039   CL 106 11/24/2015 0453   CO2 19 (L) 06/21/2016 1039   BUN 17.0 06/21/2016 1039   CREATININE 1.4 (H) 06/21/2016 1039      Component Value Date/Time   CALCIUM 9.4 06/21/2016 1039   ALKPHOS 69 06/21/2016 1039   AST 17 06/21/2016 1039   ALT 17 06/21/2016 1039   BILITOT 0.43 06/21/2016 1039       RADIOGRAPHIC STUDIES: No results found.  ASSESSMENT AND PLAN: This is a very pleasant 80 years old African-American male with metastatic non-small cell lung cancer, adenocarcinoma. The molecular study showed no actionable mutations. The patient is currently undergoing systemic chemotherapy with carboplatin and Alimta status post 6 cycles. He tolerated the last cycle of his treatment fairly well with no significant complaints except for mild fatigue. Imaging studies after the induction chemotherapy showed no evidence for disease progression.  The patient is currently undergoing maintenance systemic  chemotherapy with single agent Alimta status post 1 cycle and tolerated the first cycle well.  I recommended for him to proceed with cycle #2 today as a scheduled.  He would come back for follow-up visit in 3 weeks for evaluation before starting cycle #3.  He was advised to call immediately if he has any concerning symptoms in the interval. The patient voices understanding of current disease status and treatment options and is in agreement with the current care plan.  All questions were answered. The patient knows to call the clinic with any problems, questions or concerns. We can certainly see the patient much sooner if necessary.  Disclaimer: This note was dictated with voice recognition software. Similar sounding words can inadvertently be transcribed and may not be corrected upon review.

## 2016-06-21 NOTE — Patient Instructions (Signed)
[facility] Discharge Instructions for Patients Receiving Chemotherapy  Today you received the following chemotherapy agents Alimta.  To help prevent nausea and vomiting after your treatment, we encourage you to take your nausea medication as prescribed.   If you develop nausea and vomiting that is not controlled by your nausea medication, call the clinic.   BELOW ARE SYMPTOMS THAT SHOULD BE REPORTED IMMEDIATELY:  *FEVER GREATER THAN 100.5 F  *CHILLS WITH OR WITHOUT FEVER  NAUSEA AND VOMITING THAT IS NOT CONTROLLED WITH YOUR NAUSEA MEDICATION  *UNUSUAL SHORTNESS OF BREATH  *UNUSUAL BRUISING OR BLEEDING  TENDERNESS IN MOUTH AND THROAT WITH OR WITHOUT PRESENCE OF ULCERS  *URINARY PROBLEMS  *BOWEL PROBLEMS  UNUSUAL RASH Items with * indicate a potential emergency and should be followed up as soon as possible.  Feel free to call the clinic you have any questions or concerns. The clinic phone number is [phone].  Please show the CHEMO ALERT CARD at check-in to the Emergency Department and triage nurse.   

## 2016-06-21 NOTE — Telephone Encounter (Signed)
Per LOS I have scheduled appts and notified the scheduler 

## 2016-06-21 NOTE — Telephone Encounter (Signed)
Message sent to chemo scheduler to add chemo, per MD orders. Avs report and appointment schedule given to patient, per 06/21/16 los.

## 2016-06-26 ENCOUNTER — Telehealth: Payer: Self-pay | Admitting: *Deleted

## 2016-06-26 NOTE — Telephone Encounter (Signed)
Patient's niece called leaving message requesting "return call.  What is causing headache.  He's experiencing pain in the back of his head each time he coughs.  Return number [phone]."    Called Matthew Berger who "denies experiencing this before.  Yesterday I coughed like when you have a cold and a sharp pain shoots up the back of my head.  It's a nagging dry cough I've had since I had pneumonia.  I've not had any pain today with the cough.  The cough continues but the pain was only once."  Will notify Dr. [name].       Next scheduled F/U with next Alimta is 07-12-2016.

## 2016-06-28 ENCOUNTER — Encounter (HOSPITAL_COMMUNITY): Payer: Self-pay

## 2016-06-28 ENCOUNTER — Other Ambulatory Visit: Payer: Self-pay

## 2016-06-28 ENCOUNTER — Inpatient Hospital Stay (HOSPITAL_COMMUNITY)
Admission: EM | Admit: 2016-06-28 | Discharge: 2016-07-06 | DRG: 871 | Disposition: A | Discharge status: Home Health | Payer: Medicare Other | Attending: Internal Medicine | Admitting: Internal Medicine

## 2016-06-28 ENCOUNTER — Emergency Department (HOSPITAL_COMMUNITY): Payer: Medicare Other

## 2016-06-28 DIAGNOSIS — E876 Hypokalemia: Secondary | ICD-10-CM | POA: Diagnosis present

## 2016-06-28 DIAGNOSIS — Z803 Family history of malignant neoplasm of breast: Secondary | ICD-10-CM | POA: Diagnosis not present

## 2016-06-28 DIAGNOSIS — I110 Hypertensive heart disease with heart failure: Secondary | ICD-10-CM | POA: Diagnosis present

## 2016-06-28 DIAGNOSIS — J189 Pneumonia, unspecified organism: Secondary | ICD-10-CM | POA: Diagnosis present

## 2016-06-28 DIAGNOSIS — N400 Enlarged prostate without lower urinary tract symptoms: Secondary | ICD-10-CM | POA: Diagnosis present

## 2016-06-28 DIAGNOSIS — K219 Gastro-esophageal reflux disease without esophagitis: Secondary | ICD-10-CM | POA: Diagnosis present

## 2016-06-28 DIAGNOSIS — D899 Disorder involving the immune mechanism, unspecified: Secondary | ICD-10-CM | POA: Diagnosis present

## 2016-06-28 DIAGNOSIS — Z87891 Personal history of nicotine dependence: Secondary | ICD-10-CM | POA: Diagnosis not present

## 2016-06-28 DIAGNOSIS — E872 Acidosis: Secondary | ICD-10-CM | POA: Diagnosis present

## 2016-06-28 DIAGNOSIS — I959 Hypotension, unspecified: Secondary | ICD-10-CM | POA: Diagnosis not present

## 2016-06-28 DIAGNOSIS — C3490 Malignant neoplasm of unspecified part of unspecified bronchus or lung: Secondary | ICD-10-CM | POA: Diagnosis not present

## 2016-06-28 DIAGNOSIS — R739 Hyperglycemia, unspecified: Secondary | ICD-10-CM | POA: Diagnosis present

## 2016-06-28 DIAGNOSIS — Z823 Family history of stroke: Secondary | ICD-10-CM | POA: Diagnosis not present

## 2016-06-28 DIAGNOSIS — E785 Hyperlipidemia, unspecified: Secondary | ICD-10-CM | POA: Diagnosis present

## 2016-06-28 DIAGNOSIS — A419 Sepsis, unspecified organism: Secondary | ICD-10-CM | POA: Diagnosis present

## 2016-06-28 DIAGNOSIS — E860 Dehydration: Secondary | ICD-10-CM | POA: Diagnosis present

## 2016-06-28 DIAGNOSIS — J9601 Acute respiratory failure with hypoxia: Secondary | ICD-10-CM | POA: Diagnosis present

## 2016-06-28 DIAGNOSIS — Z800 Family history of malignant neoplasm of digestive organs: Secondary | ICD-10-CM

## 2016-06-28 DIAGNOSIS — I4891 Unspecified atrial fibrillation: Secondary | ICD-10-CM | POA: Diagnosis not present

## 2016-06-28 DIAGNOSIS — C3432 Malignant neoplasm of lower lobe, left bronchus or lung: Secondary | ICD-10-CM | POA: Diagnosis present

## 2016-06-28 DIAGNOSIS — C7951 Secondary malignant neoplasm of bone: Secondary | ICD-10-CM | POA: Diagnosis present

## 2016-06-28 DIAGNOSIS — Z79899 Other long term (current) drug therapy: Secondary | ICD-10-CM | POA: Diagnosis not present

## 2016-06-28 DIAGNOSIS — K5900 Constipation, unspecified: Secondary | ICD-10-CM | POA: Diagnosis present

## 2016-06-28 DIAGNOSIS — Z8249 Family history of ischemic heart disease and other diseases of the circulatory system: Secondary | ICD-10-CM

## 2016-06-28 DIAGNOSIS — Z7982 Long term (current) use of aspirin: Secondary | ICD-10-CM

## 2016-06-28 DIAGNOSIS — I10 Essential (primary) hypertension: Secondary | ICD-10-CM | POA: Diagnosis present

## 2016-06-28 DIAGNOSIS — N390 Urinary tract infection, site not specified: Secondary | ICD-10-CM

## 2016-06-28 DIAGNOSIS — I5032 Chronic diastolic (congestive) heart failure: Secondary | ICD-10-CM | POA: Diagnosis present

## 2016-06-28 DIAGNOSIS — R6510 Systemic inflammatory response syndrome (SIRS) of non-infectious origin without acute organ dysfunction: Secondary | ICD-10-CM | POA: Diagnosis not present

## 2016-06-28 DIAGNOSIS — I471 Supraventricular tachycardia: Secondary | ICD-10-CM | POA: Diagnosis present

## 2016-06-28 DIAGNOSIS — D61810 Antineoplastic chemotherapy induced pancytopenia: Secondary | ICD-10-CM | POA: Diagnosis present

## 2016-06-28 DIAGNOSIS — N179 Acute kidney failure, unspecified: Secondary | ICD-10-CM | POA: Diagnosis present

## 2016-06-28 DIAGNOSIS — R531 Weakness: Secondary | ICD-10-CM

## 2016-06-28 DIAGNOSIS — R918 Other nonspecific abnormal finding of lung field: Secondary | ICD-10-CM | POA: Diagnosis present

## 2016-06-28 DIAGNOSIS — R0600 Dyspnea, unspecified: Secondary | ICD-10-CM

## 2016-06-28 DIAGNOSIS — R7303 Prediabetes: Secondary | ICD-10-CM | POA: Diagnosis present

## 2016-06-28 HISTORY — DX: Heart failure, unspecified: I50.9

## 2016-06-28 LAB — URINE MICROSCOPIC-ADD ON: RBC / HPF: NONE SEEN RBC/hpf (ref 0–5)

## 2016-06-28 LAB — CBC WITH DIFFERENTIAL/PLATELET
BASOS ABS: 0 10*3/uL (ref 0.0–0.1)
BASOS PCT: 0 %
EOS ABS: 0 10*3/uL (ref 0.0–0.7)
Eosinophils Relative: 0 %
HCT: 36.3 % — ABNORMAL LOW (ref 39.0–52.0)
HEMOGLOBIN: 12.6 g/dL — AB (ref 13.0–17.0)
LYMPHS PCT: 9 %
Lymphs Abs: 0.3 10*3/uL — ABNORMAL LOW (ref 0.7–4.0)
MCH: 31.7 pg (ref 26.0–34.0)
MCHC: 34.7 g/dL (ref 30.0–36.0)
MCV: 91.2 fL (ref 78.0–100.0)
MONO ABS: 0.6 10*3/uL (ref 0.1–1.0)
Monocytes Relative: 23 %
NEUTROS PCT: 68 %
Neutro Abs: 1.9 10*3/uL (ref 1.7–7.7)
PLATELETS: 75 10*3/uL — AB (ref 150–400)
RBC: 3.98 MIL/uL — AB (ref 4.22–5.81)
RDW: 13.7 % (ref 11.5–15.5)
WBC: 2.8 10*3/uL — AB (ref 4.0–10.5)

## 2016-06-28 LAB — COMPREHENSIVE METABOLIC PANEL
ALT: 12 U/L — ABNORMAL LOW (ref 17–63)
ANION GAP: 11 (ref 5–15)
AST: 19 U/L (ref 15–41)
Albumin: 3.5 g/dL (ref 3.5–5.0)
Alkaline Phosphatase: 57 U/L (ref 38–126)
BILIRUBIN TOTAL: 2.4 mg/dL — AB (ref 0.3–1.2)
BUN: 27 mg/dL — AB (ref 6–20)
CHLORIDE: 104 mmol/L (ref 101–111)
CO2: 21 mmol/L — ABNORMAL LOW (ref 22–32)
Calcium: 9.2 mg/dL (ref 8.9–10.3)
Creatinine, Ser: 1.45 mg/dL — ABNORMAL HIGH (ref 0.61–1.24)
GFR, EST AFRICAN AMERICAN: 52 mL/min — AB (ref >60)
GFR, EST NON AFRICAN AMERICAN: 45 mL/min — AB (ref >60)
Glucose, Bld: 247 mg/dL — ABNORMAL HIGH (ref 65–99)
POTASSIUM: 4.2 mmol/L (ref 3.5–5.1)
Sodium: 136 mmol/L (ref 135–145)
TOTAL PROTEIN: 7.1 g/dL (ref 6.5–8.1)

## 2016-06-28 LAB — GLUCOSE, CAPILLARY
GLUCOSE-CAPILLARY: 222 mg/dL — AB (ref 65–99)
Glucose-Capillary: 133 mg/dL — ABNORMAL HIGH (ref 65–99)

## 2016-06-28 LAB — URINALYSIS, ROUTINE W REFLEX MICROSCOPIC
BILIRUBIN URINE: NEGATIVE
Glucose, UA: 500 mg/dL — AB
Hgb urine dipstick: NEGATIVE
KETONES UR: NEGATIVE mg/dL
LEUKOCYTES UA: NEGATIVE
NITRITE: NEGATIVE
PH: 6 (ref 5.0–8.0)
PROTEIN: 30 mg/dL — AB
Specific Gravity, Urine: 1.022 (ref 1.005–1.030)

## 2016-06-28 LAB — I-STAT CG4 LACTIC ACID, ED: LACTIC ACID, VENOUS: 3.23 mmol/L — AB (ref 0.5–1.9)

## 2016-06-28 LAB — I-STAT TROPONIN, ED: TROPONIN I, POC: 0.08 ng/mL (ref 0.00–0.08)

## 2016-06-28 LAB — LACTIC ACID, PLASMA
LACTIC ACID, VENOUS: 1.6 mmol/L (ref 0.5–1.9)
LACTIC ACID, VENOUS: 2.4 mmol/L — AB (ref 0.5–1.9)

## 2016-06-28 LAB — MAGNESIUM
MAGNESIUM: 1.8 mg/dL (ref 1.7–2.4)
Magnesium: 1.8 mg/dL (ref 1.7–2.4)

## 2016-06-28 LAB — BRAIN NATRIURETIC PEPTIDE: B NATRIURETIC PEPTIDE 5: 123.7 pg/mL — AB (ref 0.0–100.0)

## 2016-06-28 LAB — TSH: TSH: 1.193 u[IU]/mL (ref 0.350–4.500)

## 2016-06-28 LAB — LIPASE, BLOOD: LIPASE: 18 U/L (ref 11–51)

## 2016-06-28 LAB — PHOSPHORUS: PHOSPHORUS: 2.5 mg/dL (ref 2.5–4.6)

## 2016-06-28 MED ORDER — DILTIAZEM HCL ER COATED BEADS 180 MG PO CP24
180.0000 mg | ORAL_CAPSULE | Freq: Every day | ORAL | Status: DC
  Administered 2016-06-28: 180 mg via ORAL
  Filled 2016-06-28: qty 1

## 2016-06-28 MED ORDER — SODIUM CHLORIDE 0.9 % IV BOLUS (SEPSIS)
1000.0000 mL | Freq: Once | INTRAVENOUS | Status: AC
  Administered 2016-06-28: 1000 mL via INTRAVENOUS

## 2016-06-28 MED ORDER — BRINZOLAMIDE 1 % OP SUSP
2.0000 [drp] | Freq: Two times a day (BID) | OPHTHALMIC | Status: DC
  Administered 2016-06-28 – 2016-07-06 (×16): 2 [drp] via OPHTHALMIC
  Filled 2016-06-28 (×3): qty 10

## 2016-06-28 MED ORDER — ADENOSINE 6 MG/2ML IV SOLN
12.0000 mg | Freq: Once | INTRAVENOUS | Status: AC
  Administered 2016-06-28: 12 mg via INTRAVENOUS

## 2016-06-28 MED ORDER — VITAMIN B-12 1000 MCG PO TABS
1000.0000 ug | ORAL_TABLET | Freq: Every day | ORAL | Status: DC
  Administered 2016-06-28 – 2016-07-06 (×9): 1000 ug via ORAL
  Filled 2016-06-28 (×9): qty 1

## 2016-06-28 MED ORDER — ENOXAPARIN SODIUM 40 MG/0.4ML ~~LOC~~ SOLN
40.0000 mg | SUBCUTANEOUS | Status: DC
  Administered 2016-06-29: 40 mg via SUBCUTANEOUS
  Filled 2016-06-28: qty 0.4

## 2016-06-28 MED ORDER — DORZOLAMIDE HCL 2 % OP SOLN
1.0000 [drp] | Freq: Two times a day (BID) | OPHTHALMIC | Status: DC
  Administered 2016-06-28 – 2016-07-06 (×16): 1 [drp] via OPHTHALMIC
  Filled 2016-06-28 (×3): qty 10

## 2016-06-28 MED ORDER — ATORVASTATIN CALCIUM 10 MG PO TABS
20.0000 mg | ORAL_TABLET | Freq: Every day | ORAL | Status: DC
  Administered 2016-06-28 – 2016-06-30 (×3): 20 mg via ORAL
  Filled 2016-06-28 (×3): qty 2

## 2016-06-28 MED ORDER — ACETAMINOPHEN 650 MG RE SUPP: 650.0000 mg | Freq: Four times a day (QID) | RECTAL | Status: DC | PRN

## 2016-06-28 MED ORDER — ASPIRIN EC 81 MG PO TBEC
81.0000 mg | DELAYED_RELEASE_TABLET | Freq: Every day | ORAL | Status: DC
  Administered 2016-06-28 – 2016-07-06 (×7): 81 mg via ORAL
  Filled 2016-06-28 (×7): qty 1

## 2016-06-28 MED ORDER — ONDANSETRON HCL 4 MG/2ML IJ SOLN: 4.0000 mg | Freq: Four times a day (QID) | INTRAMUSCULAR | Status: DC | PRN

## 2016-06-28 MED ORDER — TAMSULOSIN HCL 0.4 MG PO CAPS
0.4000 mg | ORAL_CAPSULE | Freq: Every day | ORAL | Status: DC
  Administered 2016-06-28 – 2016-07-05 (×8): 0.4 mg via ORAL
  Filled 2016-06-28 (×8): qty 1

## 2016-06-28 MED ORDER — DILTIAZEM HCL ER COATED BEADS 180 MG PO CP24
180.0000 mg | ORAL_CAPSULE | Freq: Two times a day (BID) | ORAL | Status: DC
  Administered 2016-06-29 – 2016-07-06 (×14): 180 mg via ORAL
  Filled 2016-06-28 (×14): qty 1

## 2016-06-28 MED ORDER — SENNOSIDES-DOCUSATE SODIUM 8.6-50 MG PO TABS
1.0000 | ORAL_TABLET | Freq: Every evening | ORAL | Status: DC | PRN
  Administered 2016-06-30 (×2): 1 via ORAL
  Filled 2016-06-28 (×2): qty 1

## 2016-06-28 MED ORDER — FOLIC ACID 1 MG PO TABS
1.0000 mg | ORAL_TABLET | Freq: Every day | ORAL | Status: DC
  Administered 2016-06-28 – 2016-07-06 (×9): 1 mg via ORAL
  Filled 2016-06-28 (×9): qty 1

## 2016-06-28 MED ORDER — INSULIN ASPART 100 UNIT/ML ~~LOC~~ SOLN: 0.0000 [IU] | Freq: Every day | SUBCUTANEOUS | Status: DC

## 2016-06-28 MED ORDER — ONDANSETRON HCL 4 MG PO TABS: 4.0000 mg | ORAL_TABLET | Freq: Four times a day (QID) | ORAL | Status: DC | PRN

## 2016-06-28 MED ORDER — NON FORMULARY: 2.0000 [drp] | Freq: Two times a day (BID) | Status: DC

## 2016-06-28 MED ORDER — DILTIAZEM LOAD VIA INFUSION
20.0000 mg | Freq: Once | INTRAVENOUS | Status: AC
  Administered 2016-06-28: 20 mg via INTRAVENOUS
  Filled 2016-06-28: qty 20

## 2016-06-28 MED ORDER — DIGOXIN 125 MCG PO TABS
0.2500 mg | ORAL_TABLET | Freq: Every day | ORAL | Status: DC
Start: 2016-06-28 — End: 2016-07-01
  Administered 2016-06-28 – 2016-07-01 (×4): 0.25 mg via ORAL
  Filled 2016-06-28 (×4): qty 2

## 2016-06-28 MED ORDER — PANTOPRAZOLE SODIUM 40 MG PO TBEC
40.0000 mg | DELAYED_RELEASE_TABLET | Freq: Every day | ORAL | Status: DC
  Administered 2016-06-28 – 2016-07-06 (×9): 40 mg via ORAL
  Filled 2016-06-28 (×9): qty 1

## 2016-06-28 MED ORDER — METOPROLOL TARTRATE 25 MG PO TABS
25.0000 mg | ORAL_TABLET | Freq: Two times a day (BID) | ORAL | Status: DC
  Administered 2016-06-28: 25 mg via ORAL
  Filled 2016-06-28: qty 1

## 2016-06-28 MED ORDER — PROCHLORPERAZINE MALEATE 10 MG PO TABS: 10.0000 mg | ORAL_TABLET | Freq: Four times a day (QID) | ORAL | Status: DC | PRN

## 2016-06-28 MED ORDER — METOPROLOL TARTRATE 50 MG PO TABS
50.0000 mg | ORAL_TABLET | Freq: Two times a day (BID) | ORAL | Status: DC
  Administered 2016-06-28 – 2016-07-06 (×15): 50 mg via ORAL
  Filled 2016-06-28: qty 1
  Filled 2016-06-28: qty 2
  Filled 2016-06-28: qty 1
  Filled 2016-06-28: qty 2
  Filled 2016-06-28 (×3): qty 1
  Filled 2016-06-28 (×7): qty 2
  Filled 2016-06-28: qty 1

## 2016-06-28 MED ORDER — ACETAMINOPHEN 325 MG PO TABS
650.0000 mg | ORAL_TABLET | Freq: Four times a day (QID) | ORAL | Status: DC | PRN
  Administered 2016-06-30: 650 mg via ORAL
  Filled 2016-06-28: qty 2

## 2016-06-28 MED ORDER — ADENOSINE 6 MG/2ML IV SOLN
INTRAVENOUS | Status: AC
  Filled 2016-06-28: qty 2

## 2016-06-28 MED ORDER — BRIMONIDINE TARTRATE 0.2 % OP SOLN
2.0000 [drp] | Freq: Two times a day (BID) | OPHTHALMIC | Status: DC
  Administered 2016-06-28 – 2016-07-06 (×16): 2 [drp] via OPHTHALMIC
  Filled 2016-06-28 (×3): qty 5

## 2016-06-28 MED ORDER — ADENOSINE 6 MG/2ML IV SOLN
INTRAVENOUS | Status: AC
  Administered 2016-06-28: 6 mg
  Filled 2016-06-28: qty 4

## 2016-06-28 MED ORDER — SODIUM CHLORIDE 0.9 % IV SOLN
INTRAVENOUS | Status: DC
  Administered 2016-06-28 – 2016-07-01 (×4): via INTRAVENOUS

## 2016-06-28 MED ORDER — LATANOPROST 0.005 % OP SOLN
1.0000 [drp] | Freq: Every day | OPHTHALMIC | Status: DC
  Administered 2016-06-28 – 2016-07-05 (×8): 1 [drp] via OPHTHALMIC
  Filled 2016-06-28 (×2): qty 2.5

## 2016-06-28 MED ORDER — SODIUM CHLORIDE 0.9 % IV SOLN: INTRAVENOUS | Status: DC

## 2016-06-28 MED ORDER — INSULIN ASPART 100 UNIT/ML ~~LOC~~ SOLN
0.0000 [IU] | Freq: Three times a day (TID) | SUBCUTANEOUS | Status: DC
  Administered 2016-06-28: 5 [IU] via SUBCUTANEOUS
  Administered 2016-06-29: 3 [IU] via SUBCUTANEOUS
  Administered 2016-06-29: 5 [IU] via SUBCUTANEOUS
  Administered 2016-06-30 (×2): 2 [IU] via SUBCUTANEOUS
  Administered 2016-07-01 – 2016-07-02 (×2): 3 [IU] via SUBCUTANEOUS
  Administered 2016-07-03 – 2016-07-06 (×4): 2 [IU] via SUBCUTANEOUS

## 2016-06-28 MED ORDER — DILTIAZEM HCL-DEXTROSE 100-5 MG/100ML-% IV SOLN (PREMIX)
5.0000 mg/h | INTRAVENOUS | Status: DC
  Administered 2016-06-28: 5 mg/h via INTRAVENOUS
  Administered 2016-06-28 – 2016-06-29 (×4): 15 mg/h via INTRAVENOUS
  Filled 2016-06-28 (×5): qty 100

## 2016-06-28 NOTE — ED Notes (Signed)
Pt given water w/ EDP permission.

## 2016-06-28 NOTE — ED Notes (Signed)
Bed: WA09 Expected date:  Expected time:  Means of arrival:  Comments: EMS-cancer

## 2016-06-28 NOTE — ED Notes (Signed)
Pam, ICU Charge RN, called and 20 min timer started.

## 2016-06-28 NOTE — Telephone Encounter (Signed)
Pt admitted to hospital  

## 2016-06-28 NOTE — H&P (Addendum)
History and Physical  Matthew Berger BLT:[id number] DOB: [birth year] DOA: 06/28/2016  Referring physician: Vanita Panda, MD PCP: [name], MD  Oncologist: Earlie Server  Chief Complaint: dizziness  HPI: Matthew Berger is a 80 y.o. male currently being treated with maintenance chemotherapy for stage IV lung cancer with metastases who presented to the emergency Department complaining of progressive weakness and dyspnea on exertion. The patient had chemotherapy last week and tolerated it well. He continues to have difficulty with ambulating and occasionally reports shortness of breath. He denies chest pain. He also has had poor oral intake over the last several days according to family members. He denies fever and chills. He denies nausea vomiting and diarrhea. He was noted to have an elevated lactic acid but no apparent source of infection could be found.  In the emergency department he was found to be clinically dehydrated with an acute renal insufficiency. He was also noted to be in atrial fibrillation with RVR. He was started on an IV diltiazem infusion to control his heart rate. He is being admitted to the stepdown unit for further evaluation and management.    Review of Systems: All systems reviewed and apart from history of presenting illness, are negative.  Past Medical History:  Diagnosis Date  . Atrial fibrillation ([place])   . Encounter for antineoplastic chemotherapy 01/05/2016  . GERD (gastroesophageal reflux disease)   . Hypercholesteremia   . Hypertension   . Non-small cell lung cancer (NSCLC) ([place]) 11/19/15  . Pneumonia 11/18/2015   Past Surgical History:  Procedure Laterality Date  . CATARACT EXTRACTION Bilateral   . ROTATOR CUFF REPAIR Right   . VIDEO BRONCHOSCOPY Bilateral 11/19/2015   Procedure: VIDEO BRONCHOSCOPY WITH FLUORO;  Surgeon: Rigoberto Noel, MD;  Location: [facility];  Service: Cardiopulmonary;  Laterality: Bilateral;  . WISDOM TOOTH EXTRACTION     Social  History:  reports that he quit smoking about 25 years ago. His smoking use included Cigarettes. He has a 80.00 pack-year smoking history. He has never used smokeless tobacco. He reports that he does not drink alcohol or use drugs.   No Known Allergies  Family History  Problem Relation Age of Onset  . Breast cancer Sister   . Stroke Sister   . Colon cancer Brother   . Cancer Maternal Aunt     x2  . Hypertension Mother   . Stroke Mother   . Breast cancer Cousin   . Diabetes Other   . Hypertension Other   . Hyperlipidemia Other     Prior to Admission medications   Medication Sig Start Date End Date Taking? Authorizing Provider  aspirin 81 MG tablet Take 81 mg by mouth daily.   Yes Historical Provider, [place]  atorvastatin (LIPITOR) 20 MG tablet Take 20 mg by mouth daily.   Yes Historical Provider, [place]  Brinzolamide-Brimonidine [facility]) 1-0.2 % SUSP Apply 2 drops to eye 2 (two) times daily.   Yes Historical Provider, [place]  dexamethasone (DECADRON) 4 MG tablet TAKE 1 TABLET BY MOUTH TWICE A DAY WITH A MEAL the day before,of and the day after chemo 03/30/16  Yes Historical Provider, [place]  diltiazem (CARDIZEM CD) 180 MG 24 hr capsule 1 (ONE) CAPSULE ER 24HR BY MOUTH DAILY 03/08/16  Yes Historical Provider, [place]  dorzolamide (TRUSOPT) 2 % ophthalmic solution Place 1 drop into the left eye 2 (two) times daily.   Yes Historical Provider, [place]  folic acid (FOLVITE) 1 MG tablet TAKE 1 TABLET (1 MG TOTAL) BY MOUTH DAILY.  03/10/16  Yes Historical Provider, [place]  latanoprost (XALATAN) 0.005 % ophthalmic solution Place 1 drop into both eyes at bedtime.   Yes Historical Provider, [place]  metoprolol tartrate (LOPRESSOR) 25 MG tablet Take 25 mg by mouth 2 (two) times daily.   Yes Historical Provider, [place]  prochlorperazine (COMPAZINE) 10 MG tablet TAKE 1 TABLET BY MOUTH EVERY 6 HOURS AS NEEDED FOR NAUSEA AND VOMITING 04/13/16  Yes Curt Bears, MD  tamsulosin (FLOMAX) 0.4 MG CAPS capsule Take 0.4 mg by mouth daily.   Yes  Historical Provider, MD  vitamin B-12 (CYANOCOBALAMIN) 1000 MCG tablet Take 1,000 mcg by mouth daily.   Yes Historical Provider, [place]   Physical Exam: Vitals:   06/28/16 1240 06/28/16 1249 06/28/16 1300 06/28/16 1320  BP: (!) 144/128 (!) 144/128 108/61 123/72  Pulse: 76 (!) 160 105 113  Resp: (!) 33 23 (!) 30 (!) 32  Temp:      TempSrc:      SpO2: 95% 92% 94% 96%  Weight:      Height:         General exam: Moderately built and nourished patient, lying comfortably supine on the gurney in no obvious distress.  Head, eyes and ENT: Nontraumatic and normocephalic. Pupils equally reacting to light and accommodation. Oral mucosa Dry.  Neck: Supple. No JVD, carotid bruit or thyromegaly.  Lymphatics: No lymphadenopathy.  Respiratory system: Bilateral diminished basilar breath sounds. No increased work of breathing.  Cardiovascular system: S1 and S2 heard,Tachycardic rate, irregularly irregular. No JVD, murmurs, gallops, clicks or pedal edema.  Gastrointestinal system: Abdomen is nondistended, soft and nontender. Normal bowel sounds heard. No organomegaly or masses appreciated.  Central nervous system: Alert and oriented. No focal neurological deficits.  Extremities: Symmetric 5 x 5 power. Peripheral pulses symmetrically felt. 1+ bilateral nonpitting edema.  Skin: No rashes or acute findings.  Musculoskeletal system: Negative exam.  Psychiatry: Pleasant and cooperative.   Labs on Admission:  Basic Metabolic Panel:  Recent Labs Lab 06/28/16 0946  NA 136  K 4.2  CL 104  CO2 21*  GLUCOSE 247*  BUN 27*  CREATININE 1.45*  CALCIUM 9.2  MG 1.8   Liver Function Tests:  Recent Labs Lab 06/28/16 0946  AST 19  ALT 12*  ALKPHOS 57  BILITOT 2.4*  PROT 7.1  ALBUMIN 3.5    Recent Labs Lab 06/28/16 0946  LIPASE 18   No results for input(s): AMMONIA in the last 168 hours. CBC:  Recent Labs Lab 06/28/16 0946  WBC 2.8*  NEUTROABS 1.9  HGB 12.6*  HCT 36.3*  MCV  91.2  PLT 75*   Cardiac Enzymes: No results for input(s): CKTOTAL, CKMB, CKMBINDEX, TROPONINI in the last 168 hours.  BNP (last 3 results) No results for input(s): PROBNP in the last 8760 hours. CBG: No results for input(s): GLUCAP in the last 168 hours.  Radiological Exams on Admission: Dg Chest Port 1 View  Result Date: 06/28/2016 CLINICAL DATA:  Increased weakness and shortness of breath. History of lung cancer. EXAM: PORTABLE CHEST 1 VIEW COMPARISON:  Chest CT 05/22/2016 FINDINGS: Hazy left perihilar opacity correlates with adenocarcinoma seen on previous CT. Multiple sub solid pulmonary nodules are not visible radiographically. No evidence of superimposed pneumonia. No effusion or pneumothorax. Normal heart size mediastinal contours. IMPRESSION: Multi focal pulmonary adenocarcinoma. No evidence of acute superimposed disease. Electronically Signed   By: Monte Fantasia M.D.   On: 06/28/2016 10:28   EKG: Independently reviewed.  Assessment/Plan Principal Problem:   Atrial fibrillation  with rapid ventricular response (HCC) Active Problems:   HTN (hypertension)   Dyslipidemia   BPH (benign prostatic hyperplasia)   Pulmonary nodules/lesions, multiple   GERD (gastroesophageal reflux disease)   Non-small cell lung cancer (NSCLC) (HCC)   Atrial fibrillation with RVR (HCC)   SIRS (systemic inflammatory response syndrome) (HCC)   Dehydration   Hyperglycemia   1. A. fib with RVR-continue on IV diltiazem infusion, appreciate cardiology consultation, resume home diltiazem and metoprolol. Hopefully can wean off IV diltiazem later today. Admitted to stepdown unit for closer monitoring. Corrected dehydration with IV fluids. Monitor electrolytes closely.  SIRS-no acute source of infection has been found at this time. Repeating chest x-ray after IV hydration. Cycle and trend lactic acid to be sure that it is coming down with hydration. 2. Hypertension-resume eating home diltiazem and metoprolol  as noted above. Follow blood pressures closely. 3. GERD-continue GI protection with pantoprazole.  4. BPH - resume home tamsulosin. 5. Dyslipidemia - stable on home meds. 6. Non-small cell lung cancer-patient has been on maintenance chemotherapy with Dr. Earlie Server and recently received chemotherapy 1 week ago and tolerated fairly well per family. Would need to follow up with Dr. Earlie Server after discharge. Will send an electronic EMR message to Dr. Earlie Server notifying him of patient's admission.  7. Thrombocytopenia - repeat CBC in AM to follow platelets.  Would stop lovenox if platelets drop any further.   8. Hyperglycemia - Pt has prediabetes but recently had taken steroids for chemotherapy.  Will monitor and provide sliding scale coverage and carb modified diet and adjust further as needed.    DVT Prophylaxis; lovenox, TED hoses Family: updated at bedside GI prophylaxis: protonix  Irwin Brakeman MD [facility] [phone]

## 2016-06-28 NOTE — ED Triage Notes (Addendum)
Per EMS, Pt, from home, c/o dizziness w/ standing x 1 week and BLE weakness x "several months."  Denies pain.  Pt reports that he changed chemo medications prior to symptoms starting.  Hx of lung CA.  Last chemo x 1 week ago.  CBG 230.   Pt's wife reports "he has only been peeing a little."

## 2016-06-28 NOTE — Consult Note (Signed)
CARDIOLOGY CONSULT NOTE  Patient ID: Matthew Berger MRN: [id number] DOB/AGE: [birth year] 78 y.o.  Admit date: 06/28/2016 Referring Physician: Internal Medicine Primary Physician:  [name], MD Reason for Consultation: Afib with RVR  HPI: Matthew Berger  is a 80 y.o. male who was initially seen in our office in February 2015 for shortness of breath, echocardiogram revealing mild to moderate LVH with moderate to severe pulmonary regurgitation. Treadmill stress test initially equivocal for ischemia and he was then scheduled for nuclear stress test which revealed atrial tachycardia and frequent PACs but with normal perfusion imaging and normal LVEF. Pulmonary etiology was suspected and a chest x-ray was performed which revealed a left lower lobe infiltrate. He later underwent CT scan and then bronchoscopy confirming non-small cell carcinoma with mets to thevertebrae. He was then started on chemotherapy. He had been stable with Metoprolol and Cardizem combination.   He was recently seen by his PCP for follow-up and auscultation of the heart revealed an irregular rhythm and an EKG was performed revealing new onset atrial fibrillation versus atrial tachycardia. He was referred back to [place] for reevaluation, last seen on 06/14/2016. Diltiazem dose was increased and he was started on Xarelto for CVA prevention (high Chads score) and scheduled to follow up this week. I was not completely convinced that it was A. Fib vs A. Tach  He presented to the ED on 06/28/2016 with weakness and worsening dyspnea on exertion. On arrival he was noted to have acute renal insufficiency and tachycardia, again appeared to be a.fib with RVR.    Patient states that over the past 1 week, since his chemotherapy on Friday, he has felt extremely weak and tired. He states that standing up was extremely difficult with marked dizziness. No syncope. Finally presented to the hospital for evaluation. Denies any chest pain, has chronic  dyspnea, denies any palpitations. No leg edema. No PND or orthopnea.  Past Medical History:  Diagnosis Date  . Atrial fibrillation ([place])   . Encounter for antineoplastic chemotherapy 01/05/2016  . GERD (gastroesophageal reflux disease)   . Hypercholesteremia   . Hypertension   . Non-small cell lung cancer (NSCLC) ([place]) 11/19/15  . Pneumonia 11/18/2015     Past Surgical History:  Procedure Laterality Date  . CATARACT EXTRACTION Bilateral   . ROTATOR CUFF REPAIR Right   . VIDEO BRONCHOSCOPY Bilateral 11/19/2015   Procedure: VIDEO BRONCHOSCOPY WITH FLUORO;  Surgeon: Rigoberto Noel, MD;  Location: [place];  Service: Cardiopulmonary;  Laterality: Bilateral;  . WISDOM TOOTH EXTRACTION       Family History  Problem Relation Age of Onset  . Breast cancer Sister   . Stroke Sister   . Colon cancer Brother   . Cancer Maternal Aunt     x2  . Hypertension Mother   . Stroke Mother   . Breast cancer Cousin   . Diabetes Other   . Hypertension Other   . Hyperlipidemia Other      Social History: Social History   Social History  . Marital status: Married    Spouse name: N/A  . Number of children: Y8  . Years of education: N/A   Occupational History  . retired Administrator    Social History Main Topics  . Smoking status: Former Smoker    Packs/day: 2.00    Years: 40.00    Types: Cigarettes    Quit date: 10/02/1990  . Smokeless tobacco: Never Used     Comment: smoked 1-2 ppd.   . Alcohol  use No  . Drug use: No  . Sexual activity: Not Currently   Other Topics Concern  . Not on file   Social History Narrative   Originally from [place]. Always lived in [place]. Previously has traveled to [place], [place], [place], [place], [place], [place], [place], & [place]. Previously worked driving garbage truck. No pets currently. No known mold exposure.      Prescriptions Prior to Admission  Medication Sig Dispense Refill Last Dose  . aspirin 81 MG tablet Take 81 mg by mouth daily.   06/27/2016 at 1430  . atorvastatin (LIPITOR) 20 MG  tablet Take 20 mg by mouth daily.   06/27/2016 at Unknown time  . Brinzolamide-Brimonidine (SIMBRINZA) 1-0.2 % SUSP Apply 2 drops to eye 2 (two) times daily.   06/27/2016 at Unknown time  . dexamethasone (DECADRON) 4 MG tablet TAKE 1 TABLET BY MOUTH TWICE A DAY WITH A MEAL the day before,of and the day after chemo  1 06/22/2016  . diltiazem (CARDIZEM CD) 180 MG 24 hr capsule 1 (ONE) CAPSULE ER 24HR BY MOUTH DAILY  0 06/27/2016 at Unknown time  . dorzolamide (TRUSOPT) 2 % ophthalmic solution Place 1 drop into the left eye 2 (two) times daily.   06/27/2016 at Unknown time  . folic acid (FOLVITE) 1 MG tablet TAKE 1 TABLET (1 MG TOTAL) BY MOUTH DAILY.  0 Past Week at Unknown time  . latanoprost (XALATAN) 0.005 % ophthalmic solution Place 1 drop into both eyes at bedtime.   06/27/2016 at Unknown time  . metoprolol tartrate (LOPRESSOR) 25 MG tablet Take 25 mg by mouth 2 (two) times daily.   06/27/2016 at 2300  . prochlorperazine (COMPAZINE) 10 MG tablet TAKE 1 TABLET BY MOUTH EVERY 6 HOURS AS NEEDED FOR NAUSEA AND VOMITING 30 tablet 0 06/27/2016 at Unknown time  . tamsulosin (FLOMAX) 0.4 MG CAPS capsule Take 0.4 mg by mouth daily.   Past Week at Unknown time  . vitamin B-12 (CYANOCOBALAMIN) 1000 MCG tablet Take 1,000 mcg by mouth daily.   Past Week at Unknown time    ROS: General: Reports fatigue, weakness; no fevers/chills/night sweats Eyes: no blurry vision, diplopia, or amaurosis ENT: no sore throat or hearing loss Resp: Reports dyspnea on exertion; no cough, has occasional wheezing, no hemoptysis CV: no edema or palpitations GI: no abdominal pain, nausea, vomiting, diarrhea, or constipation GU: no dysuria, frequency, or hematuria Skin: no rash Neuro: no headache, numbness, tingling, or weakness of extremities Musculoskeletal: no joint pain or swelling Heme: no bleeding, DVT, or easy bruising Endo: no polydipsia or polyuria   Physical Exam: Blood pressure 127/64, pulse (!) 43, temperature 100.3 F  (37.9 C), temperature source Oral, resp. rate (!) 30, height '6\' 1"'$  (1.854 m), weight 109.8 kg (242 lb), SpO2 97 %.   General appearance: alert, cooperative, fatigued and no distress Lungs: Scattered bilateral basilar crackles, diminished breath sounds at the bases, no expiratory wheeze. Heart: Tachycardia present. No murmur appreciated. S1 and S2 appear to be normal. Abdomen: soft, non-tender; bowel sounds normal; no masses,  no organomegaly and Obese. Extremities: extremities normal, atraumatic, no cyanosis or edema Pulses: 2+ and symmetric Neurologic: Grossly normal  Labs:   Lab Results  Component Value Date   WBC 2.8 (L) 06/28/2016   HGB 12.6 (L) 06/28/2016   HCT 36.3 (L) 06/28/2016   MCV 91.2 06/28/2016   PLT 75 (L) 06/28/2016    Recent Labs Lab 06/28/16 0946  NA 136  K 4.2  CL 104  CO2 21*  BUN 27*  CREATININE 1.45*  CALCIUM 9.2  PROT 7.1  BILITOT 2.4*  ALKPHOS 57  ALT 12*  AST 19  GLUCOSE 247*    Recent Labs  06/28/16 0946  BNP 123.7*    Recent Labs  11/21/15 0541  TSH 2.013    EKG: Atrial tachycardia with RVR. Intravenous adenosine was administered, appears to have P waves. Highly doubt this is atrial flutter ablation with RVR. Findings more consistent with multifocal atrial tachycardia..  Echo:  11/23/2015: Normal LV systolic function, grade 1 diastolic dysfunction, no significant valvular abnormality.   Radiology: Dg Chest Port 1 View  Result Date: 06/28/2016 CLINICAL DATA:  Increased weakness and shortness of breath. History of lung cancer. EXAM: PORTABLE CHEST 1 VIEW COMPARISON:  Chest CT 05/22/2016 FINDINGS: Hazy left perihilar opacity correlates with adenocarcinoma seen on previous CT. Multiple sub solid pulmonary nodules are not visible radiographically. No evidence of superimposed pneumonia. No effusion or pneumothorax. Normal heart size mediastinal contours. IMPRESSION: Multi focal pulmonary adenocarcinoma. No evidence of acute superimposed  disease. Electronically Signed   By: Monte Fantasia M.D.   On: 06/28/2016 10:28    Scheduled Meds: . aspirin EC  81 mg Oral Daily  . atorvastatin  20 mg Oral Daily  . brinzolamide  2 drop Both Eyes BID   And  . brimonidine  2 drop Both Eyes BID  . diltiazem  180 mg Oral Daily  . dorzolamide  1 drop Left Eye BID  . [START ON 06/29/2016] enoxaparin (LOVENOX) injection  40 mg Subcutaneous Q24H  . folic acid  1 mg Oral Daily  . insulin aspart  0-15 Units Subcutaneous TID WC  . insulin aspart  0-5 Units Subcutaneous QHS  . latanoprost  1 drop Both Eyes QHS  . metoprolol tartrate  25 mg Oral BID  . pantoprazole  40 mg Oral Daily  . sodium chloride  1,000 mL Intravenous Once  . tamsulosin  0.4 mg Oral QHS  . vitamin B-12  1,000 mcg Oral Daily   Continuous Infusions: . sodium chloride    . diltiazem (CARDIZEM) infusion 15 mg/hr (06/28/16 1504)   PRN Meds:.acetaminophen **OR** acetaminophen, ondansetron **OR** ondansetron (ZOFRAN) IV, prochlorperazine, senna-docusate  ASSESSMENT AND PLAN:  1. Multifocal atrial tachycardia, unlikely to be Atrial fibrillation with RVR  (CHA2DS2-VASc Score is 3 with yearly risk of stroke of 3.2%.   2. Shortness of breath 3. Essential hypertension 4. Hyperlipidemia 5. Non-small cell carcinoma 6. Hyperglycemia 7. Thrombocytopenia  Recommendation:  I administered intravenous adenosine to confirm if this is atrial tachycardia. Rhythm strips do show sinus rhythm, ectopic atrial rhythm. Extremely difficult situation, suspect MAT than true atrial flutter ablation. He would also be at high risk for bleeding due to marked thrombocytopenia related to chemotherapy. At this point I have not initiated anticoagulation. I will increase his metoprolol from 25 mg to 50 mg twice a day and also increase diltiazem by mouth to 180 mg twice a day. Continue IV diltiazem for now. Add digoxin.  Adrian Prows, MD 06/28/2016, 3:04 PM [facility]. [place] Pager:  [phone] Office: [phone] If no answer Cell [phone]

## 2016-06-28 NOTE — ED Provider Notes (Signed)
[facility] DEPT Provider Note   CSN: [id number] Arrival date & time: 06/28/16  0901     History   Chief Complaint Chief Complaint  Patient presents with  . CA Pt  . Dizziness    HPI Matthew Berger is a 80 y.o. male.  HPI  Patient with multiple medical issues presents with concern of weakness, dyspnea with exertion and upright positioning. Patient has multiple medical issues including lung cancer, for which he is receiving chemotherapy. His last chemotherapy session was one week ago, and he states that it was well tolerated. It is unclear when this illness began, but over at least the past few days the patient has had ongoing generalized weakness, difficulty with breathing while exerting himself, standing. He denies focal pain, and when asked how he is feeling, while he is still, he states that he feels"pretty good". He denies recent fever, chills, confusion, disorientation.   Past Medical History:  Diagnosis Date  . Atrial fibrillation ([place])   . Encounter for antineoplastic chemotherapy 01/05/2016  . GERD (gastroesophageal reflux disease)   . Hypercholesteremia   . Hypertension   . Non-small cell lung cancer (NSCLC) ([place]) 11/19/15  . Pneumonia 11/18/2015    Patient Active Problem List   Diagnosis Date Noted  . Dyspnea 02/04/2016  . Encounter for antineoplastic chemotherapy 01/05/2016  . GERD (gastroesophageal reflux disease) 12/02/2015  . Atrial fibrillation ([place]) 12/02/2015  . Non-small cell lung cancer (NSCLC) ([place]) 11/19/2015  . Pulmonary nodules/lesions, multiple   . HTN (hypertension) 11/17/2015  . Dyslipidemia 11/17/2015  . BPH (benign prostatic hyperplasia) 11/17/2015    Past Surgical History:  Procedure Laterality Date  . CATARACT EXTRACTION Bilateral   . ROTATOR CUFF REPAIR Right   . VIDEO BRONCHOSCOPY Bilateral 11/19/2015   Procedure: VIDEO BRONCHOSCOPY WITH FLUORO;  Surgeon: Rigoberto Noel, MD;  Location: [facility];  Service: Cardiopulmonary;   Laterality: Bilateral;  . WISDOM TOOTH EXTRACTION         Home Medications    Prior to Admission medications   Medication Sig Start Date End Date Taking? Authorizing Provider  aspirin 81 MG tablet Take 81 mg by mouth daily.   Yes Historical Provider, MD  atorvastatin (LIPITOR) 20 MG tablet Take 20 mg by mouth daily.   Yes Historical Provider, MD  Brinzolamide-Brimonidine [facility]) 1-0.2 % SUSP Apply 2 drops to eye 2 (two) times daily.   Yes Historical Provider, MD  dexamethasone (DECADRON) 4 MG tablet TAKE 1 TABLET BY MOUTH TWICE A DAY WITH A MEAL the day before,of and the day after chemo 03/30/16  Yes Historical Provider, MD  diltiazem (CARDIZEM CD) 180 MG 24 hr capsule 1 (ONE) CAPSULE ER 24HR BY MOUTH DAILY 03/08/16  Yes Historical Provider, MD  dorzolamide (TRUSOPT) 2 % ophthalmic solution Place 1 drop into the left eye 2 (two) times daily.   Yes Historical Provider, MD  folic acid (FOLVITE) 1 MG tablet TAKE 1 TABLET (1 MG TOTAL) BY MOUTH DAILY. 03/10/16  Yes Historical Provider, MD  latanoprost (XALATAN) 0.005 % ophthalmic solution Place 1 drop into both eyes at bedtime.   Yes Historical Provider, MD  metoprolol tartrate (LOPRESSOR) 25 MG tablet Take 25 mg by mouth 2 (two) times daily.   Yes Historical Provider, MD  prochlorperazine (COMPAZINE) 10 MG tablet TAKE 1 TABLET BY MOUTH EVERY 6 HOURS AS NEEDED FOR NAUSEA AND VOMITING 04/13/16  Yes Curt Bears, MD  tamsulosin (FLOMAX) 0.4 MG CAPS capsule Take 0.4 mg by mouth daily.   Yes Historical Provider,  MD  vitamin B-12 (CYANOCOBALAMIN) 1000 MCG tablet Take 1,000 mcg by mouth daily.   Yes Historical Provider, MD    Family History Family History  Problem Relation Age of Onset  . Breast cancer Sister   . Stroke Sister   . Colon cancer Brother   . Cancer Maternal Aunt     x2  . Hypertension Mother   . Stroke Mother   . Breast cancer Cousin   . Diabetes Other   . Hypertension Other   . Hyperlipidemia Other     Social  History Social History  Substance Use Topics  . Smoking status: Former Smoker    Packs/day: 2.00    Years: 40.00    Types: Cigarettes    Quit date: 10/02/1990  . Smokeless tobacco: Never Used     Comment: smoked 1-2 ppd.   . Alcohol use No     Allergies   Review of patient's allergies indicates no known allergies.   Review of Systems Review of Systems  Constitutional:       Per HPI, otherwise negative  HENT:       Per HPI, otherwise negative  Respiratory:       Per HPI, otherwise negative  Cardiovascular:       Per HPI, otherwise negative  Gastrointestinal: Negative for vomiting.  Endocrine:       Negative aside from HPI  Genitourinary:       Neg aside from HPI   Musculoskeletal:       Per HPI, otherwise negative  Skin: Negative.   Allergic/Immunologic: Positive for immunocompromised state.  Neurological: Positive for weakness and light-headedness. Negative for syncope.     Physical Exam Updated Vital Signs BP 133/92   Pulse 95   Temp 100.3 F (37.9 C) (Oral)   Resp (!) 28   Ht '6\' 1"'$  (1.854 m)   Wt 242 lb (109.8 kg)   SpO2 93%   BMI 31.93 kg/m   Physical Exam  Constitutional: He is oriented to person, place, and time. He appears well-developed. No distress.  HENT:  Head: Normocephalic and atraumatic.  Eyes: Conjunctivae and EOM are normal.  Cardiovascular:  Irregular tachycardia, heart rate greater than 200  Pulmonary/Chest: No stridor. No respiratory distress.  Diminished breath sounds bilaterally, no obvious wheezing  Abdominal: He exhibits no distension.  Musculoskeletal: He exhibits edema.  Sym bilateral edema  Neurological: He is alert and oriented to person, place, and time.  Skin: Skin is warm and dry.  Psychiatric: He has a normal mood and affect.  Nursing note and vitals reviewed.    ED Treatments / Results  Labs (all labs ordered are listed, but only abnormal results are displayed) Labs Reviewed  COMPREHENSIVE METABOLIC PANEL -  Abnormal; Notable for the following:       Result Value   CO2 21 (*)    Glucose, Bld 247 (*)    BUN 27 (*)    Creatinine, Ser 1.45 (*)    ALT 12 (*)    Total Bilirubin 2.4 (*)    GFR calc non Af Amer 45 (*)    GFR calc Af Amer 52 (*)    All other components within normal limits  BRAIN NATRIURETIC PEPTIDE - Abnormal; Notable for the following:    B Natriuretic Peptide 123.7 (*)    All other components within normal limits  CBC WITH DIFFERENTIAL/PLATELET - Abnormal; Notable for the following:    WBC 2.8 (*)    RBC 3.98 (*)  Hemoglobin 12.6 (*)    HCT 36.3 (*)    Platelets 75 (*)    Lymphs Abs 0.3 (*)    All other components within normal limits  URINALYSIS, ROUTINE W REFLEX MICROSCOPIC (NOT AT [facility]) - Abnormal; Notable for the following:    Color, Urine AMBER (*)    APPearance CLOUDY (*)    Glucose, UA 500 (*)    Protein, ur 30 (*)    All other components within normal limits  URINE MICROSCOPIC-ADD ON - Abnormal; Notable for the following:    Squamous Epithelial / LPF 0-5 (*)    Bacteria, UA FEW (*)    All other components within normal limits  I-STAT CG4 LACTIC ACID, ED - Abnormal; Notable for the following:    Lactic Acid, Venous 3.23 (*)    All other components within normal limits  LIPASE, BLOOD  MAGNESIUM  CALCIUM, IONIZED  I-STAT TROPOININ, ED    EKG #1 rates 201, A. fib, rapid ventricular rate, abnormal   EKG  EKG Interpretation None       Radiology Dg Chest Port 1 View  Result Date: 06/28/2016 CLINICAL DATA:  Increased weakness and shortness of breath. History of lung cancer. EXAM: PORTABLE CHEST 1 VIEW COMPARISON:  Chest CT 05/22/2016 FINDINGS: Hazy left perihilar opacity correlates with adenocarcinoma seen on previous CT. Multiple sub solid pulmonary nodules are not visible radiographically. No evidence of superimposed pneumonia. No effusion or pneumothorax. Normal heart size mediastinal contours. IMPRESSION: Multi focal pulmonary adenocarcinoma. No  evidence of acute superimposed disease. Electronically Signed   By: Monte Fantasia M.D.   On: 06/28/2016 10:28    Procedures Procedures (including critical care time)  Medications Ordered in ED Medications  diltiazem (CARDIZEM) 1 mg/mL load via infusion 20 mg (20 mg Intravenous Bolus from Bag 06/28/16 1009)    And  diltiazem (CARDIZEM) 100 mg in dextrose 5% 140m (1 mg/mL) infusion (10 mg/hr Intravenous Rate/Dose Change 06/28/16 1057)  sodium chloride 0.9 % bolus 1,000 mL (0 mLs Intravenous Stopped 06/28/16 1057)     Initial Impression / Assessment and Plan / ED Course  I have reviewed the triage vital signs and the nursing notes.  Pertinent labs & imaging results that were available during my care of the patient were reviewed by me and considered in my medical decision making (see chart for details).  Clinical Course  Comment By Time  HR now 150-165 RCarmin Muskrat MD 09/27 1048    Initial heart rate during my initial evaluation greater than 200. A. fib, rapid ventricular response. Patient had 2 attempts at vagal maneuver, each with decline in heart rate to 175, but with quick recurrence of tachycardia greater than 200. Patient receiving Diltiazem bolus, infusion. This is a home medication for the patient.  Initial lactic acid 3.2 Trop positive   12:08 PM Multiple family members are aware of all findings. Patient with similar condition, heart rate still 150. Labs consistent with relative dehydration, with creatinine 1.5. Patient continues to receive IV fluids, continuous diltiazem. Persistent A. fib I discussed this case with cardiology, who follow as a consulting team.  Final Clinical Impressions(s) / ED Diagnoses  Visual multiple medical issues including A. fib, stage IV lung cancer, currently getting chemotherapy presents with dyspnea, fatigue, and on initial exam is found be notably tachycardic, with heart rate greater than 200. Given the patient's recent chemotherapy,  there is concern for immunocompromised state, the patient's labs are generally reassuring, he is slightly neutropenic. Patient has minimal fever, and  given his immunocompromise state, will require additional monitoring. Ceftriaxone provided for presumed UTI.  Patient needs multiple SIRS criteria.  Patient received continuous fluids, diltiazem, ceftriaxone, supplemental oxygen. After discussion with cardiology, patient was admitted to the stepdown unit for further evaluation, management.  CRITICAL CARE Performed by: Carmin Muskrat Total critical care time: 40 minutes Critical care time was exclusive of separately billable procedures and treating other patients. Critical care was necessary to treat or prevent imminent or life-threatening deterioration. Critical care was time spent personally by me on the following activities: development of treatment plan with patient and/or surrogate as well as nursing, discussions with consultants, evaluation of patient's response to treatment, examination of patient, obtaining history from patient or surrogate, ordering and performing treatments and interventions, ordering and review of laboratory studies, ordering and review of radiographic studies, pulse oximetry and re-evaluation of patient's condition.       Carmin Muskrat, MD 06/28/16 1213

## 2016-06-29 ENCOUNTER — Inpatient Hospital Stay (HOSPITAL_COMMUNITY): Payer: Medicare Other

## 2016-06-29 ENCOUNTER — Encounter (HOSPITAL_COMMUNITY): Payer: Self-pay | Admitting: Radiology

## 2016-06-29 LAB — GLUCOSE, CAPILLARY
GLUCOSE-CAPILLARY: 157 mg/dL — AB (ref 65–99)
Glucose-Capillary: 116 mg/dL — ABNORMAL HIGH (ref 65–99)
Glucose-Capillary: 168 mg/dL — ABNORMAL HIGH (ref 65–99)
Glucose-Capillary: 223 mg/dL — ABNORMAL HIGH (ref 65–99)
Glucose-Capillary: 173 mg/dL — ABNORMAL HIGH (ref 65–99)

## 2016-06-29 LAB — CBC
HEMATOCRIT: 29 % — AB (ref 39.0–52.0)
Hemoglobin: 10.2 g/dL — ABNORMAL LOW (ref 13.0–17.0)
MCH: 31.4 pg (ref 26.0–34.0)
MCHC: 35.2 g/dL (ref 30.0–36.0)
MCV: 89.2 fL (ref 78.0–100.0)
PLATELETS: 52 10*3/uL — AB (ref 150–400)
RBC: 3.25 MIL/uL — ABNORMAL LOW (ref 4.22–5.81)
RDW: 13.7 % (ref 11.5–15.5)
WBC: 3.8 10*3/uL — AB (ref 4.0–10.5)

## 2016-06-29 LAB — COMPREHENSIVE METABOLIC PANEL
ALBUMIN: 2.9 g/dL — AB (ref 3.5–5.0)
ALT: 12 U/L — AB (ref 17–63)
AST: 17 U/L (ref 15–41)
Alkaline Phosphatase: 46 U/L (ref 38–126)
Anion gap: 6 (ref 5–15)
BILIRUBIN TOTAL: 1 mg/dL (ref 0.3–1.2)
BUN: 20 mg/dL (ref 6–20)
CO2: 24 mmol/L (ref 22–32)
CREATININE: 1.01 mg/dL (ref 0.61–1.24)
Calcium: 8.3 mg/dL — ABNORMAL LOW (ref 8.9–10.3)
Chloride: 107 mmol/L (ref 101–111)
GFR calc Af Amer: >60 mL/min (ref >60)
GLUCOSE: 123 mg/dL — AB (ref 65–99)
Potassium: 3.8 mmol/L (ref 3.5–5.1)
Sodium: 137 mmol/L (ref 135–145)
TOTAL PROTEIN: 5.6 g/dL — AB (ref 6.5–8.1)

## 2016-06-29 LAB — MRSA PCR SCREENING: MRSA by PCR: POSITIVE — AB

## 2016-06-29 LAB — APTT: aPTT: 25 seconds (ref 24–36)

## 2016-06-29 LAB — CALCIUM, IONIZED: Calcium, Ionized, Serum: 4.9 mg/dL (ref 4.5–5.6)

## 2016-06-29 LAB — PROCALCITONIN: Procalcitonin: 1.28 ng/mL

## 2016-06-29 LAB — LACTIC ACID, PLASMA
LACTIC ACID, VENOUS: 2.9 mmol/L — AB (ref 0.5–1.9)
Lactic Acid, Venous: 1.6 mmol/L (ref 0.5–1.9)

## 2016-06-29 LAB — PROTIME-INR
INR: 1.23
PROTHROMBIN TIME: 15.6 s — AB (ref 11.4–15.2)

## 2016-06-29 MED ORDER — FLECAINIDE ACETATE 50 MG PO TABS
50.0000 mg | ORAL_TABLET | Freq: Two times a day (BID) | ORAL | Status: DC
  Administered 2016-06-30: 50 mg via ORAL
  Filled 2016-06-29 (×2): qty 1

## 2016-06-29 MED ORDER — MUPIROCIN 2 % EX OINT
1.0000 "application " | TOPICAL_OINTMENT | Freq: Two times a day (BID) | CUTANEOUS | Status: AC
  Administered 2016-06-29 – 2016-07-03 (×10): 1 via NASAL
  Filled 2016-06-29 (×2): qty 22

## 2016-06-29 MED ORDER — VANCOMYCIN HCL IN DEXTROSE 1-5 GM/200ML-% IV SOLN
1000.0000 mg | Freq: Once | INTRAVENOUS | Status: AC
  Administered 2016-06-29: 1000 mg via INTRAVENOUS

## 2016-06-29 MED ORDER — VANCOMYCIN HCL IN DEXTROSE 1-5 GM/200ML-% IV SOLN
1000.0000 mg | Freq: Once | INTRAVENOUS | Status: AC
  Administered 2016-06-29: 1000 mg via INTRAVENOUS
  Filled 2016-06-29: qty 200

## 2016-06-29 MED ORDER — VANCOMYCIN HCL IN DEXTROSE 750-5 MG/150ML-% IV SOLN
750.0000 mg | Freq: Two times a day (BID) | INTRAVENOUS | Status: DC
  Administered 2016-06-30 – 2016-07-01 (×3): 750 mg via INTRAVENOUS
  Filled 2016-06-29 (×4): qty 150

## 2016-06-29 MED ORDER — CHLORHEXIDINE GLUCONATE CLOTH 2 % EX PADS
6.0000 | MEDICATED_PAD | Freq: Every day | CUTANEOUS | Status: AC
  Administered 2016-06-29 – 2016-07-03 (×5): 6 via TOPICAL

## 2016-06-29 MED ORDER — METOPROLOL TARTRATE 5 MG/5ML IV SOLN
5.0000 mg | Freq: Once | INTRAVENOUS | Status: AC
  Administered 2016-06-29: 5 mg via INTRAVENOUS
  Filled 2016-06-29: qty 5

## 2016-06-29 MED ORDER — PIPERACILLIN-TAZOBACTAM 3.375 G IVPB
3.3750 g | Freq: Three times a day (TID) | INTRAVENOUS | Status: DC
  Administered 2016-06-29 – 2016-07-03 (×12): 3.375 g via INTRAVENOUS
  Filled 2016-06-29 (×12): qty 50

## 2016-06-29 MED ORDER — SODIUM CHLORIDE 0.9 % IV BOLUS (SEPSIS)
500.0000 mL | Freq: Once | INTRAVENOUS | Status: AC
  Administered 2016-06-29: 500 mL via INTRAVENOUS

## 2016-06-29 MED ORDER — PIPERACILLIN-TAZOBACTAM 3.375 G IVPB 30 MIN
3.3750 g | Freq: Once | INTRAVENOUS | Status: AC
  Administered 2016-06-29: 3.375 g via INTRAVENOUS
  Filled 2016-06-29: qty 50

## 2016-06-29 NOTE — Care Management Note (Signed)
Case Management Note  Patient Details  Name: Matthew Berger MRN: [id number] Date of Birth: 04-15-1936  Subjective/Objective:              Sepsis and a.fib requiring iv cardizem drip      Action/Plan: home with wife   Expected Discharge Date:   (unknown)               Expected Discharge Plan:  Home/Self Care  In-House Referral:     Discharge planning Services     Post Acute Care Choice:    Choice offered to:     DME Arranged:    DME Agency:     HH Arranged:    [place] Agency:     Status of Service:  In process, will continue to follow  If discussed at Long Length of Stay Meetings, dates discussed:    Additional Comments:Date:  June 29, 2016 Chart reviewed for concurrent status and case management needs. Will continue to follow the patient for status change: Discharge Planning: following for needs Expected discharge date: [id number] Velva Harman, BSN, [place], [place]  Leeroy Cha, RN 06/29/2016, 10:49 AM

## 2016-06-29 NOTE — Progress Notes (Signed)
CRITICAL VALUE ALERT  Critical value received:  Lactic Acid 2.9  Date of notification:  06/29/16  Time of notification:  8441  Critical value read back:Yes.    Nurse who received alert:  Katherine Mantle RN  MD notified (1st page):  Dr Doyle Askew  Time of first page:  23  MD notified (2nd page):  Time of second page:  Responding MD:  Doyle Askew  Time MD responded:  [phone]

## 2016-06-29 NOTE — Progress Notes (Signed)
Pharmacy Antibiotic Note  Matthew Berger is a 80 y.o. male with hx of NSCL currently undergoing chemotherapy, presented to the ED on 9/28 with c/o dizziness and weakness.  He was found to be on  afib-- s/p adenosine on 9/27 and now being managed by cardiology.  Patient is febrile.  To start vancomycin and zosyn for suspected sepsis.  -  9/28 CXR: no PNA - Tmax 100.6, wbc low, scr down 1.01 (crcl~62 N), ANC 1.9, LA 2.4 on 9/27   Plan: - vancomycin 1gm IV x1 per MD. Will give and additional 1 gm to get 2 gram total as a loading dose, then 750 mg IV q12h - zosyn 3.375 gm IV x1 over 30 minutes, then 3.375gm IV q8h (infuse over 4 hours)  ___________________________  Height: '6\' 2"'$  (188 cm) Weight: 228 lb 2.8 oz (103.5 kg) IBW/kg (Calculated) : 82.2  Temp (24hrs), Avg:98.9 F (37.2 C), Min:98.2 F (36.8 C), Max:100.6 F (38.1 C)   Recent Labs Lab 06/28/16 0946 06/28/16 1003 06/28/16 1443 06/28/16 1713 06/29/16 0353  WBC 2.8*  --   --   --  3.8*  CREATININE 1.45*  --   --   --  1.01  LATICACIDVEN  --  3.23* 1.6 2.4*  --     Estimated Creatinine Clearance: 78.6 mL/min (by C-G formula based on SCr of 1.01 mg/dL).    No Known Allergies   Thank you for allowing pharmacy to be a part of this patient's care.  Lynelle Doctor 06/29/2016 1:19 PM

## 2016-06-29 NOTE — Progress Notes (Signed)
Patient heart rate was elevated and sustaining between 120-130's. Pt.had scheduled Cardizem and Metoprolol, however too soon to receive. Called on call for cardiology and notified Dr.Kadakia. New order was written.

## 2016-06-29 NOTE — Progress Notes (Addendum)
Patient ID: Matthew Berger, male   DOB: [birth year], 80 y.o.   MRN: [id number]    PROGRESS NOTE    Matthew Berger  NTZ:[id number] DOB: [birth year] DOA: 06/28/2016  PCP: [name], MD   Brief Narrative:  80 y.o. male currently being treated with maintenance chemotherapy for stage IV lung cancer with metastases who presented to the emergency Department complaining of progressive weakness and dyspnea on exertion. The patient had chemotherapy last week and tolerated it well. He continues to have difficulty with ambulating mostly due to dyspnea but he currently denies chest pain and he also denies dyspnea at rest.   In the emergency department he was found to be clinically dehydrated with an acute renal insufficiency. He was also noted to be in atrial fibrillation with RVR. He was started on an IV diltiazem infusion to control his heart rate.   Assessment & Plan:  Sepsis of unclear etiology - please note that pt met sepsis criteria on admission, Tachycardia, fever, leukopenia in immunocompromised pt - source unclear  - sepsis order set placed this am, CT chest, blood cultures, procalcitonin, lactic acid  - place on empiric ABX vanc and zosyn for now until we get results of the above tests back  - keep in SDU - provide IVF  Multifocal atrial tachycardia - per cardiology unlikely to be Atrial fibrillation with RVR. (CHA2DS2-VASc Score is 3 with yearly risk of stroke of 3.2%. - suspect this is related to underlying lung cancer with mets - pt transitioned to oral Cardizem 80 mg PO QD, metoprolol dose also increased from 25 mg PO BID to 50 mg PO BID  - no plan for anticoagulation due to thrombocytopenia  - keep on tele monitor for now   Acute kidney injury - pre renal in etiology in the setting of sepsis - resolved with IVF - BMP in AM  Acute hypoxic respiratory failure due to lung cancer, ? MAT vs a-fib  - pt with known stage IV (T3, N2, M1b) non-small cell lung cancer, adenocarcinoma    - CT chest pending this AM  - pt follows with Dr. [name] - I will notify him of pt's admission   Essential hypertension - reasonably stable this AM   Pancytopenia   - from chemotherapy  - pt on Lovenox SQ for DVT prophylaxis, will stop and place on SCD's - monitor counts, CBC in AM   DVT prophylaxis: SCD's Code Status: Full  Family Communication: Patient at bedside  Disposition Plan: Keep in SDU  Consultants:   Cardiology   Procedures:   None  Antimicrobials:   Vancomycin 9/28 -->  Zosyn 9/28 -->   Subjective: Reports he feels better, still with exertional dyspnea.   Objective: Vitals:   06/29/16 1008 06/29/16 1100 06/29/16 1200 06/29/16 1214  BP:  134/76 102/63   Pulse: (!) 140 (!) 135 (!) 112   Resp:  (!) 27 (!) 23   Temp:    (!) 100.6 F (38.1 C)  TempSrc:    Oral  SpO2:  92% 90%   Weight:      Height:        Intake/Output Summary (Last 24 hours) at 06/29/16 1304 Last data filed at 06/29/16 1200  Gross per 24 hour  Intake          1512.08 ml  Output              600 ml  Net           912.08 ml  Filed Weights   06/28/16 0923 06/28/16 1430 06/29/16 0500  Weight: 109.8 kg (242 lb) 103.3 kg (227 lb 11.8 oz) 103.5 kg (228 lb 2.8 oz)    Examination:  General exam: Appears to be resting this AM, NAD Respiratory system: diminished breath sounds at bases with scattered rhonchi  Cardiovascular system: IRRR. No rubs, gallops or clicks. No pedal edema. Gastrointestinal system: Abdomen is nondistended, soft and nontender. No organomegaly or masses felt.  Central nervous system: Alert and oriented. No focal neurological deficits.  Data Reviewed: I have personally reviewed following labs and imaging studies  CBC:  Recent Labs Lab 06/28/16 0946 06/29/16 0353  WBC 2.8* 3.8*  NEUTROABS 1.9  --   HGB 12.6* 10.2*  HCT 36.3* 29.0*  MCV 91.2 89.2  PLT 75* 52*   Basic Metabolic Panel:  Recent Labs Lab 06/28/16 0946 06/28/16 1443 06/29/16 0353   NA 136  --  137  K 4.2  --  3.8  CL 104  --  107  CO2 21*  --  24  GLUCOSE 247*  --  123*  BUN 27*  --  20  CREATININE 1.45*  --  1.01  CALCIUM 9.2  --  8.3*  MG 1.8 1.8  --   PHOS  --  2.5  --    Liver Function Tests:  Recent Labs Lab 06/28/16 0946 06/29/16 0353  AST 19 17  ALT 12* 12*  ALKPHOS 57 46  BILITOT 2.4* 1.0  PROT 7.1 5.6*  ALBUMIN 3.5 2.9*    Recent Labs Lab 06/28/16 0946  LIPASE 18   CBG:  Recent Labs Lab 06/28/16 1620 06/28/16 2111 06/29/16 0804  GLUCAP 222* 133* 116*   Thyroid Function Tests:  Recent Labs  06/28/16 1443  TSH 1.193   Urine analysis:    Component Value Date/Time   COLORURINE AMBER (A) 06/28/2016 0950   APPEARANCEUR CLOUDY (A) 06/28/2016 0950   LABSPEC 1.022 06/28/2016 0950   PHURINE 6.0 06/28/2016 0950   GLUCOSEU 500 (A) 06/28/2016 0950   HGBUR NEGATIVE 06/28/2016 0950   BILIRUBINUR NEGATIVE 06/28/2016 0950   [place] 06/28/2016 0950   PROTEINUR 30 (A) 06/28/2016 0950   UROBILINOGEN 1.0 08/25/2010 1351   NITRITE NEGATIVE 06/28/2016 0950   LEUKOCYTESUR NEGATIVE 06/28/2016 0950   Recent Results (from the past 240 hour(s))  MRSA PCR Screening     Status: Abnormal   Collection Time: 06/28/16  2:48 PM  Result Value Ref Range Status   MRSA by PCR POSITIVE (A) NEGATIVE Final    Comment:        The GeneXpert MRSA Assay (FDA approved for NASAL specimens only), is one component of a comprehensive MRSA colonization surveillance program. It is not intended to diagnose MRSA infection nor to guide or monitor treatment for MRSA infections. RESULT CALLED TO, READ BACK BY AND VERIFIED WITH: MAYS,S. RN _0  ON 9.28.17 BY MCCOY,N.       Radiology Studies: Dg Chest Port 1 View  Result Date: 06/29/2016 CLINICAL DATA:  Sirs EXAM: PORTABLE CHEST 1 VIEW COMPARISON:  Yesterday FINDINGS: Chronic left lower lobe airspace disease correlating with adenocarcinoma. Multiple sub solid pulmonary nodules by CT are not  visible radiographically. No superimposed consolidation, edema, effusion, or pneumothorax. IMPRESSION: Chronic airspace disease from adenocarcinoma. No detectable pneumonia. Electronically Signed   By: Monte Fantasia M.D.   On: 06/29/2016 07:17   Dg Chest Port 1 View  Result Date: 06/28/2016 CLINICAL DATA:  Increased weakness and shortness of breath. History of lung  cancer. EXAM: PORTABLE CHEST 1 VIEW COMPARISON:  Chest CT 05/22/2016 FINDINGS: Hazy left perihilar opacity correlates with adenocarcinoma seen on previous CT. Multiple sub solid pulmonary nodules are not visible radiographically. No evidence of superimposed pneumonia. No effusion or pneumothorax. Normal heart size mediastinal contours. IMPRESSION: Multi focal pulmonary adenocarcinoma. No evidence of acute superimposed disease. Electronically Signed   By: Monte Fantasia M.D.   On: 06/28/2016 10:28      Scheduled Meds: . aspirin EC  81 mg Oral Daily  . atorvastatin  20 mg Oral Daily  . brinzolamide  2 drop Both Eyes BID   And  . brimonidine  2 drop Both Eyes BID  . Chlorhexidine Gluconate Cloth  6 each Topical Q0600  . digoxin  0.25 mg Oral Daily  . diltiazem  180 mg Oral BID  . dorzolamide  1 drop Left Eye BID  . enoxaparin (LOVENOX) injection  40 mg Subcutaneous Q24H  . folic acid  1 mg Oral Daily  . insulin aspart  0-15 Units Subcutaneous TID WC  . insulin aspart  0-5 Units Subcutaneous QHS  . latanoprost  1 drop Both Eyes QHS  . metoprolol tartrate  50 mg Oral BID  . mupirocin ointment  1 application Nasal BID  . pantoprazole  40 mg Oral Daily  . tamsulosin  0.4 mg Oral QHS  . vitamin B-12  1,000 mcg Oral Daily   Continuous Infusions: . sodium chloride 125 mL/hr at 06/29/16 0000  . diltiazem (CARDIZEM) infusion 12.5 mg/hr (06/29/16 1230)     LOS: 1 day    Time spent: 20 minutes    Faye Ramsay, MD [facility] Pager [phone]  If 7PM-7AM, please contact  night-coverage [email or web address] Password [email or web address] 06/29/2016, 1:04 PM

## 2016-06-29 NOTE — Progress Notes (Signed)
Called and notified cardiologist on call, Dr. Einar Gip about patient's BP. BP was 114/46 MAP 57. HR 110's-120's. New order for Flecainide given.

## 2016-06-30 LAB — GLUCOSE, CAPILLARY
GLUCOSE-CAPILLARY: 124 mg/dL — AB (ref 65–99)
GLUCOSE-CAPILLARY: 141 mg/dL — AB (ref 65–99)
Glucose-Capillary: 117 mg/dL — ABNORMAL HIGH (ref 65–99)
Glucose-Capillary: 127 mg/dL — ABNORMAL HIGH (ref 65–99)

## 2016-06-30 LAB — BASIC METABOLIC PANEL
ANION GAP: 7 (ref 5–15)
BUN: 15 mg/dL (ref 6–20)
CALCIUM: 8.2 mg/dL — AB (ref 8.9–10.3)
CO2: 22 mmol/L (ref 22–32)
Chloride: 105 mmol/L (ref 101–111)
Creatinine, Ser: 1.14 mg/dL (ref 0.61–1.24)
GFR calc Af Amer: >60 mL/min (ref >60)
GLUCOSE: 145 mg/dL — AB (ref 65–99)
Potassium: 3.8 mmol/L (ref 3.5–5.1)
SODIUM: 134 mmol/L — AB (ref 135–145)

## 2016-06-30 LAB — CBC
HCT: 27.8 % — ABNORMAL LOW (ref 39.0–52.0)
Hemoglobin: 9.5 g/dL — ABNORMAL LOW (ref 13.0–17.0)
MCH: 31.8 pg (ref 26.0–34.0)
MCHC: 34.2 g/dL (ref 30.0–36.0)
MCV: 93 fL (ref 78.0–100.0)
PLATELETS: 43 10*3/uL — AB (ref 150–400)
RBC: 2.99 MIL/uL — ABNORMAL LOW (ref 4.22–5.81)
RDW: 13.9 % (ref 11.5–15.5)
WBC: 7.6 10*3/uL (ref 4.0–10.5)

## 2016-06-30 MED ORDER — POLYETHYLENE GLYCOL 3350 17 G PO PACK
17.0000 g | PACK | Freq: Once | ORAL | Status: AC
  Administered 2016-06-30: 17 g via ORAL
  Filled 2016-06-30: qty 1

## 2016-06-30 MED ORDER — POLYETHYLENE GLYCOL 3350 17 G PO PACK
17.0000 g | PACK | Freq: Every day | ORAL | Status: DC
  Administered 2016-07-01 – 2016-07-05 (×4): 17 g via ORAL
  Filled 2016-06-30 (×5): qty 1

## 2016-06-30 MED ORDER — AMIODARONE HCL 200 MG PO TABS: 200.0000 mg | ORAL_TABLET | Freq: Once | ORAL | Status: DC

## 2016-06-30 MED ORDER — SODIUM CHLORIDE 0.9 % IV BOLUS (SEPSIS)
250.0000 mL | Freq: Once | INTRAVENOUS | Status: AC
  Administered 2016-06-30: 250 mL via INTRAVENOUS

## 2016-06-30 MED ORDER — AMIODARONE HCL 200 MG PO TABS
400.0000 mg | ORAL_TABLET | Freq: Once | ORAL | Status: AC
  Administered 2016-06-30: 400 mg via ORAL
  Filled 2016-06-30: qty 2

## 2016-06-30 MED ORDER — AMIODARONE HCL 200 MG PO TABS
400.0000 mg | ORAL_TABLET | Freq: Three times a day (TID) | ORAL | Status: DC
  Administered 2016-06-30 – 2016-07-05 (×15): 400 mg via ORAL
  Filled 2016-06-30 (×15): qty 2

## 2016-06-30 MED ORDER — SODIUM CHLORIDE 0.9 % IV BOLUS (SEPSIS)
1000.0000 mL | Freq: Once | INTRAVENOUS | Status: AC
  Administered 2016-06-30: 1000 mL via INTRAVENOUS

## 2016-06-30 NOTE — Progress Notes (Signed)
[facility] Pt c/o day of constipation. Discussed with nurse on Senakot and no results. Pt actively trying to use toilet currently. Typically uses MgCitrate. Will give dose miralax now and then qam. No KUB for now to assess stool burden. Will leave for day team.  Elwin Mocha MD

## 2016-06-30 NOTE — Progress Notes (Signed)
Patient's HR began to sustain in the 130's to 140's with HR increasing as high as 156 non-sustained at rest. He is afebrile and denies any symptoms. He is not hypotensive. Called and notified cardiologist on call. Was told to give scheduled cardiac medications earlier.

## 2016-06-30 NOTE — Progress Notes (Signed)
Subjective:  No acute symptoms or concerns. Reports feeling very winded when trying to ambulate but feels okay laying in the bed.  Objective:  Vital Signs in the last 24 hours: Temp:  [97.9 F (36.6 C)-100.7 F (38.2 C)] 98.9 F (37.2 C) (09/29 1600) Pulse Rate:  [35-143] 81 (09/29 1800) Resp:  [20-36] 34 (09/29 1800) BP: (50-135)/(18-73) 109/55 (09/29 1800) SpO2:  [88 %-99 %] 91 % (09/29 1800) Weight:  [107 kg (235 lb 14.3 oz)] 107 kg (235 lb 14.3 oz) (09/29 0230)  Intake/Output from previous day: 09/28 0701 - 09/29 0700 In: 1209.6 [I.V.:959.6; IV Piggyback:250] Out: 750 [Urine:750]  Physical Exam: General appearance: alert, cooperative, fatigued and no distress Lungs: Scattered bilateral basilar crackles, diminished breath sounds at the bases, no expiratory wheeze. Heart: Tachycardia present. No murmur appreciated. S1 and S2 appear to be normal. Abdomen: soft, non-tender; bowel sounds normal; no masses,  no organomegaly and Obese. Extremities: extremities normal, atraumatic, no cyanosis or edema Pulses: 2+ and symmetric Neurologic: Grossly normal  Lab Results: BMP  Recent Labs  06/28/16 0946 06/29/16 0353 06/30/16 0409  NA 136 137 134*  K 4.2 3.8 3.8  CL 104 107 105  CO2 21* 24 22  GLUCOSE 247* 123* 145*  BUN 27* 20 15  CREATININE 1.45* 1.01 1.14  CALCIUM 9.2 8.3* 8.2*  GFRNONAA 45* >60 >60  GFRAA 52* >60 >60    CBC  Recent Labs Lab 06/28/16 0946  06/30/16 0409  WBC 2.8*  < > 7.6  RBC 3.98*  < > 2.99*  HGB 12.6*  < > 9.5*  HCT 36.3*  < > 27.8*  PLT 75*  < > 43*  MCV 91.2  < > 93.0  MCH 31.7  < > 31.8  MCHC 34.7  < > 34.2  RDW 13.7  < > 13.9  LYMPHSABS 0.3*  --   --   MONOABS 0.6  --   --   EOSABS 0.0  --   --   BASOSABS 0.0  --   --   < > = values in this interval not displayed.  HEMOGLOBIN A1C Lab Results  Component Value Date   HGBA1C (H) 08/21/2010    5.8 (NOTE)                                                                        According to the ADA Clinical Practice Recommendations for 2011, when HbA1c is used as a screening test:   >=6.5%   Diagnostic of Diabetes Mellitus           (if abnormal result  is confirmed)  5.7-6.4%   Increased risk of developing Diabetes Mellitus  References:Diagnosis and Classification of Diabetes Mellitus,Diabetes OVFI,4332,95(JOACZ 1):S62-S69 and Standards of Medical Care in         Diabetes - 2011,Diabetes YSAY,3016,01  (Suppl 1):S11-S61.   MPG 120 (H) 08/21/2010    Cardiac Panel (last 3 results) No results for input(s): CKTOTAL, CKMB, TROPONINI, RELINDX in the last 8760 hours.  BNP (last 3 results) No results for input(s): PROBNP in the last 8760 hours.  TSH  Recent Labs  11/21/15 0541 06/28/16 1443  TSH 2.013 1.193    CHOLESTEROL No results for input(s): CHOL in the last  8760 hours.  Hepatic Function Panel  Recent Labs  06/21/16 1039 06/28/16 0946 06/29/16 0353  PROT 6.7 7.1 5.6*  ALBUMIN 3.3* 3.5 2.9*  AST '17 19 17  '$ ALT 17 12* 12*  ALKPHOS 69 57 46  BILITOT 0.43 2.4* 1.0    Imaging: Ct Chest Wo Contrast  Result Date: 06/29/2016 CLINICAL DATA:  Shortness of breath. Non-small cell lung cancer of left lower lobe. EXAM: CT CHEST WITHOUT CONTRAST TECHNIQUE: Multidetector CT imaging of the chest was performed following the standard protocol without IV contrast. COMPARISON:  CT of the chest 05/22/2016 FINDINGS: Cardiovascular: Calcific atherosclerotic disease of the coronary arteries and aorta. Small pericardial effusion at the heart base. Mediastinum/Nodes: Numerous mostly less than a cm mediastinal lymph nodes. More prominent lymph nodes are seen in precarinal station, the largest measuring 12 mm in short axis, and subcarinal station measuring up to 2 cm in short axis. Frothy material within the esophagus. Lungs/Pleura: There is overall worsening of the aeration of the lungs. There is more confluent consolidation of the left lower lobe. The previously seen individual  pulmonary masses in the basilar segments of the left lobe have coalesced. A mass in the superior segment of the left lower lobe 1.9 by 1.8 cm, previously 1.8 x 1.1 cm. There are more confluent ground-glass opacities in the lingula. The index mass in the right upper lobe is 1.6 cm, stable. There is a new ground-glass opacity in the right middle lobe, as well as individual ground-glass nodules in the medial segment of the right middle lobe. Upper Abdomen: No acute abnormality. Musculoskeletal: Sclerotic lesion in T9 vertebral body is stable without evidence of pathologic fracture. IMPRESSION: Worsening of innumerable bilateral pulmonary masses. The previous seen individual solid and ground-glass pulmonary masses in the left lower lobe have coalesced. Enlargement of pre-existing pulmonary masses an interval development of ground-glass sub solid masses in the right middle lobe. Stable predominantly sclerotic in appearance metastatic lesion in T9 vertebral body with evidence pathologic fracture. Small pericardial junction. Calcific atherosclerotic disease of the aorta and coronary arteries. Electronically Signed   By: Fidela Salisbury M.D.   On: 06/29/2016 16:11   Dg Chest Port 1 View  Result Date: 06/29/2016 CLINICAL DATA:  Sirs EXAM: PORTABLE CHEST 1 VIEW COMPARISON:  Yesterday FINDINGS: Chronic left lower lobe airspace disease correlating with adenocarcinoma. Multiple sub solid pulmonary nodules by CT are not visible radiographically. No superimposed consolidation, edema, effusion, or pneumothorax. IMPRESSION: Chronic airspace disease from adenocarcinoma. No detectable pneumonia. Electronically Signed   By: Monte Fantasia M.D.   On: 06/29/2016 07:17    Cardiac Studies:  EKG: Atrial tachycardia with RVR. Intravenous adenosine was administered, appears to have P waves. Highly doubt this is atrial flutter ablation with RVR. Findings more consistent with multifocal atrial tachycardia..  Echo:  11/23/2015:  Normal LV systolic function, grade 1 diastolic dysfunction, no significant valvular abnormality.  Assessment/Plan:  1. Multifocal atrial tachycardia, unlikely to be Atrial fibrillation with RVR  (CHA2DS2-VASc Score is 3 with yearly risk of stroke of 3.2%.   2. Shortness of breath 3. Essential hypertension 4. Hyperlipidemia 5. Non-small cell carcinoma 6. Hyperglycemia 7. Thrombocytopenia  Recommendation: Tachycardia likely secondary to sepsis. Will continue to manage conservatively to avoid profound hypotension. No acute symptoms. Will continue to follow.   Rachel Bo, NP-C 06/30/2016, 7:36 PM [facility], [place] Pager: [phone] Office: [phone]

## 2016-06-30 NOTE — Progress Notes (Addendum)
Patient ID: Matthew Berger, male   DOB: [birth year], 80 y.o.   MRN: [id number]    PROGRESS NOTE    Matthew Berger  AST:[id number] DOB: May 27, 1936 DOA: 06/28/2016  PCP: [name], MD   Brief Narrative:  80 y.o. male currently being treated with maintenance chemotherapy for stage IV lung cancer with metastases who presented to the emergency Department complaining of progressive weakness and dyspnea on exertion. The patient had chemotherapy last week and tolerated it well. He continues to have difficulty with ambulating mostly due to dyspnea but he currently denies chest pain and he also denies dyspnea at rest.   In the emergency department he was found to be clinically dehydrated with an acute renal insufficiency. He was also noted to be in atrial fibrillation with RVR. He was started on an IV diltiazem infusion to control his heart rate.   Assessment & Plan:  Sepsis of unclear etiology - please note that pt met sepsis criteria on admission, Tachycardia, fever, leukopenia in immunocompromised pt - source unclear  - CT chest with no clear evidence of PNA but progressive metastatic lung cancer  - placed on empiric ABX vanc and zosyn and will continue same regimen for now as pt still with Tmax 100.7 F - keep in SDU as pt still with low BP - pt is on metoprolol and cardizem for rate control but may need to hold pending BP stability  - cardiology has been consulted for assistance - we may need to get PCCM on board as well as pt is at high risk for clinical deterioration with multiple and complex medical conditions   Multifocal atrial tachycardia - per cardiology unlikely to be Atrial fibrillation with RVR. (CHA2DS2-VASc Score is 3 with yearly risk of stroke of 3.2%. - suspect this is related to underlying lung cancer with mets - pt transitioned to oral Cardizem 80 mg PO QD, metoprolol dose also increased from 25 mg PO BID to 50 mg PO BID  - no plan for anticoagulation due to  thrombocytopenia  - BP soft and we can support with IVF for now but if persistent hypotension, would hold at leas one of those meds to see how pt does - certainly keep in SDU   Chronic diastolic CHF - with last ECHO 11/2015 with stable EF 60% and grade I diastolic CHF - carefull with IVF as weight is up from 228 --> 235 lbs this AM - if BP low, would avoid further boluses and stop cardizem vs metoprolol   Acute kidney injury - pre renal in etiology in the setting of sepsis - resolved with IVF - BMP in AM  Acute hypoxic respiratory failure due to lung cancer, ? MAT vs a-fib  - pt with known stage IV (T3, N2, M1b) non-small cell lung cancer, adenocarcinoma  - CT chest with progressive lung cancer - pt follows with Dr. [name], he was notified of pt's admission via EPIC  - may need to consider PCT input but will discuss with pt's oncologist first per pt's wishes   Essential hypertension - hypotensive overnight, discuss with cardiology if we can stop one of the meds: Cardizem or metoprolol until BP stabilizes   Pancytopenia   - from chemotherapy  - pt on Lovenox SQ for DVT prophylaxis, will stop and place on SCD's - monitor counts, CBC in AM   DVT prophylaxis: SCD's Code Status: Full  Family Communication: Patient at bedside  Disposition Plan: Keep in SDU  Consultants:   Cardiology  Procedures:   None  Antimicrobials:   Vancomycin 9/28 -->  Zosyn 9/28 -->   Subjective: Reports he feels tired, still with exertional dyspnea.   Objective: Vitals:   06/30/16 0900 06/30/16 1000 06/30/16 1057 06/30/16 1131  BP: (!) 91/43 (!) 105/57    Pulse: (!) 51 (!) 109 (!) 111   Resp:      Temp:    99 F (37.2 C)  TempSrc:    Axillary  SpO2: 90% 94%    Weight:      Height:        Intake/Output Summary (Last 24 hours) at 06/30/16 1333 Last data filed at 06/30/16 0400  Gross per 24 hour  Intake           370.83 ml  Output              500 ml  Net          -129.17 ml    Filed Weights   06/28/16 1430 06/29/16 0500 06/30/16 0230  Weight: 103.3 kg (227 lb 11.8 oz) 103.5 kg (228 lb 2.8 oz) 107 kg (235 lb 14.3 oz)    Examination:  General exam: Appears to be resting this AM, NAD Respiratory system: diminished breath sounds at bases with scattered rhonchi  Cardiovascular system: IRRR. No rubs, gallops or clicks. Mild pedal edema.  Gastrointestinal system: Abdomen is nondistended, soft and nontender. No organomegaly or masses felt.  Central nervous system: Alert and oriented.   Data Reviewed: I have personally reviewed following labs and imaging studies  CBC:  Recent Labs Lab 06/28/16 0946 06/29/16 0353 06/30/16 0409  WBC 2.8* 3.8* 7.6  NEUTROABS 1.9  --   --   HGB 12.6* 10.2* 9.5*  HCT 36.3* 29.0* 27.8*  MCV 91.2 89.2 93.0  PLT 75* 52* 43*   Basic Metabolic Panel:  Recent Labs Lab 06/28/16 0946 06/28/16 1443 06/29/16 0353 06/30/16 0409  NA 136  --  137 134*  K 4.2  --  3.8 3.8  CL 104  --  107 105  CO2 21*  --  24 22  GLUCOSE 247*  --  123* 145*  BUN 27*  --  20 15  CREATININE 1.45*  --  1.01 1.14  CALCIUM 9.2  --  8.3* 8.2*  MG 1.8 1.8  --   --   PHOS  --  2.5  --   --    Liver Function Tests:  Recent Labs Lab 06/28/16 0946 06/29/16 0353  AST 19 17  ALT 12* 12*  ALKPHOS 57 46  BILITOT 2.4* 1.0  PROT 7.1 5.6*  ALBUMIN 3.5 2.9*    Recent Labs Lab 06/28/16 0946  LIPASE 18   CBG:  Recent Labs Lab 06/29/16 1318 06/29/16 1628 06/29/16 2115 06/30/16 0754 06/30/16 1130  GLUCAP 168* 223* 173* 124* 141*   Thyroid Function Tests:  Recent Labs  06/28/16 1443  TSH 1.193   Urine analysis:    Component Value Date/Time   COLORURINE AMBER (A) 06/28/2016 0950   APPEARANCEUR CLOUDY (A) 06/28/2016 0950   LABSPEC 1.022 06/28/2016 0950   PHURINE 6.0 06/28/2016 0950   GLUCOSEU 500 (A) 06/28/2016 0950   HGBUR NEGATIVE 06/28/2016 0950   BILIRUBINUR NEGATIVE 06/28/2016 0950   KETONESUR NEGATIVE 06/28/2016 0950    PROTEINUR 30 (A) 06/28/2016 0950   UROBILINOGEN 1.0 08/25/2010 1351   NITRITE NEGATIVE 06/28/2016 0950   LEUKOCYTESUR NEGATIVE 06/28/2016 0950   Recent Results (from the past 240 hour(s))  MRSA PCR Screening  Status: Abnormal   Collection Time: 06/28/16  2:48 PM  Result Value Ref Range Status   MRSA by PCR POSITIVE (A) NEGATIVE Final    Comment:        The GeneXpert MRSA Assay (FDA approved for NASAL specimens only), is one component of a comprehensive MRSA colonization surveillance program. It is not intended to diagnose MRSA infection nor to guide or monitor treatment for MRSA infections. RESULT CALLED TO, READ BACK BY AND VERIFIED WITH: MAYS,S. RN _0  ON 9.28.17 BY MCCOY,N.       Radiology Studies: Ct Chest Wo Contrast  Result Date: 06/29/2016 CLINICAL DATA:  Shortness of breath. Non-small cell lung cancer of left lower lobe. EXAM: CT CHEST WITHOUT CONTRAST TECHNIQUE: Multidetector CT imaging of the chest was performed following the standard protocol without IV contrast. COMPARISON:  CT of the chest 05/22/2016 FINDINGS: Cardiovascular: Calcific atherosclerotic disease of the coronary arteries and aorta. Small pericardial effusion at the heart base. Mediastinum/Nodes: Numerous mostly less than a cm mediastinal lymph nodes. More prominent lymph nodes are seen in precarinal station, the largest measuring 12 mm in short axis, and subcarinal station measuring up to 2 cm in short axis. Frothy material within the esophagus. Lungs/Pleura: There is overall worsening of the aeration of the lungs. There is more confluent consolidation of the left lower lobe. The previously seen individual pulmonary masses in the basilar segments of the left lobe have coalesced. A mass in the superior segment of the left lower lobe 1.9 by 1.8 cm, previously 1.8 x 1.1 cm. There are more confluent ground-glass opacities in the lingula. The index mass in the right upper lobe is 1.6 cm, stable. There is a new  ground-glass opacity in the right middle lobe, as well as individual ground-glass nodules in the medial segment of the right middle lobe. Upper Abdomen: No acute abnormality. Musculoskeletal: Sclerotic lesion in T9 vertebral body is stable without evidence of pathologic fracture. IMPRESSION: Worsening of innumerable bilateral pulmonary masses. The previous seen individual solid and ground-glass pulmonary masses in the left lower lobe have coalesced. Enlargement of pre-existing pulmonary masses an interval development of ground-glass sub solid masses in the right middle lobe. Stable predominantly sclerotic in appearance metastatic lesion in T9 vertebral body with evidence pathologic fracture. Small pericardial junction. Calcific atherosclerotic disease of the aorta and coronary arteries. Electronically Signed   By: Fidela Salisbury M.D.   On: 06/29/2016 16:11   Dg Chest Port 1 View  Result Date: 06/29/2016 CLINICAL DATA:  Sirs EXAM: PORTABLE CHEST 1 VIEW COMPARISON:  Yesterday FINDINGS: Chronic left lower lobe airspace disease correlating with adenocarcinoma. Multiple sub solid pulmonary nodules by CT are not visible radiographically. No superimposed consolidation, edema, effusion, or pneumothorax. IMPRESSION: Chronic airspace disease from adenocarcinoma. No detectable pneumonia. Electronically Signed   By: Monte Fantasia M.D.   On: 06/29/2016 07:17      Scheduled Meds: . amiodarone  400 mg Oral Q8H  . aspirin EC  81 mg Oral Daily  . brinzolamide  2 drop Both Eyes BID   And  . brimonidine  2 drop Both Eyes BID  . Chlorhexidine Gluconate Cloth  6 each Topical Q0600  . digoxin  0.25 mg Oral Daily  . diltiazem  180 mg Oral BID  . dorzolamide  1 drop Left Eye BID  . folic acid  1 mg Oral Daily  . insulin aspart  0-15 Units Subcutaneous TID WC  . insulin aspart  0-5 Units Subcutaneous QHS  . latanoprost  1  drop Both Eyes QHS  . metoprolol tartrate  50 mg Oral BID  . mupirocin ointment  1  application Nasal BID  . pantoprazole  40 mg Oral Daily  . piperacillin-tazobactam (ZOSYN)  IV  3.375 g Intravenous Q8H  . tamsulosin  0.4 mg Oral QHS  . vancomycin  750 mg Intravenous Q12H  . vitamin B-12  1,000 mcg Oral Daily   Continuous Infusions: . sodium chloride 50 mL/hr at 06/29/16 1742  . diltiazem (CARDIZEM) infusion Stopped (06/29/16 1340)     LOS: 2 days    Time spent: 20 minutes    Faye Ramsay, MD [facility] Pager [phone]  If 7PM-7AM, please contact night-coverage [email or web address] Password [email or web address] 06/30/2016, 1:33 PM

## 2016-07-01 LAB — GLUCOSE, CAPILLARY
GLUCOSE-CAPILLARY: 107 mg/dL — AB (ref 65–99)
GLUCOSE-CAPILLARY: 129 mg/dL — AB (ref 65–99)
Glucose-Capillary: 151 mg/dL — ABNORMAL HIGH (ref 65–99)
Glucose-Capillary: 106 mg/dL — ABNORMAL HIGH (ref 65–99)

## 2016-07-01 LAB — CBC
HCT: 26.8 % — ABNORMAL LOW (ref 39.0–52.0)
Hemoglobin: 9.2 g/dL — ABNORMAL LOW (ref 13.0–17.0)
MCH: 31.7 pg (ref 26.0–34.0)
MCHC: 34.3 g/dL (ref 30.0–36.0)
MCV: 92.4 fL (ref 78.0–100.0)
Platelets: 49 10*3/uL — ABNORMAL LOW (ref 150–400)
RBC: 2.9 MIL/uL — ABNORMAL LOW (ref 4.22–5.81)
RDW: 13.9 % (ref 11.5–15.5)
WBC: 9 10*3/uL (ref 4.0–10.5)

## 2016-07-01 LAB — BASIC METABOLIC PANEL
Anion gap: 6 (ref 5–15)
BUN: 13 mg/dL (ref 6–20)
CHLORIDE: 108 mmol/L (ref 101–111)
CO2: 23 mmol/L (ref 22–32)
CREATININE: 1.15 mg/dL (ref 0.61–1.24)
Calcium: 8.4 mg/dL — ABNORMAL LOW (ref 8.9–10.3)
GFR, EST NON AFRICAN AMERICAN: 60 mL/min — AB (ref >60)
Glucose, Bld: 120 mg/dL — ABNORMAL HIGH (ref 65–99)
Potassium: 3.6 mmol/L (ref 3.5–5.1)
SODIUM: 137 mmol/L (ref 135–145)

## 2016-07-01 LAB — VANCOMYCIN, TROUGH: VANCOMYCIN TR: 12 ug/mL — AB (ref 15–20)

## 2016-07-01 MED ORDER — DIGOXIN 125 MCG PO TABS
0.1250 mg | ORAL_TABLET | Freq: Every day | ORAL | Status: DC
  Administered 2016-07-02 – 2016-07-06 (×5): 0.125 mg via ORAL
  Filled 2016-07-01 (×5): qty 1

## 2016-07-01 MED ORDER — VANCOMYCIN HCL IN DEXTROSE 1-5 GM/200ML-% IV SOLN
1000.0000 mg | Freq: Two times a day (BID) | INTRAVENOUS | Status: DC
  Administered 2016-07-02 – 2016-07-03 (×2): 1000 mg via INTRAVENOUS
  Filled 2016-07-01 (×2): qty 200

## 2016-07-01 NOTE — Progress Notes (Signed)
Ref: Matthew Panda, MD   Subjective:  Feels little better. Heart rate in 70's. Sinus rhythm with frequent APCs from MAT. Constipated. No chest pain. T max 99.2.  Objective:  Vital Signs in the last 24 hours: Temp:  [98.2 F (36.8 C)-99.2 F (37.3 C)] 98.2 F (36.8 C) (09/30 1600) Pulse Rate:  [48-154] 72 (09/30 1600) Cardiac Rhythm: Atrial fibrillation (09/30 1600) Resp:  [18-34] 24 (09/30 1600) BP: (77-144)/(32-88) 97/42 (09/30 1600) SpO2:  [89 %-98 %] 97 % (09/30 1600) Weight:  [109.5 kg (241 lb 6.5 oz)] 109.5 kg (241 lb 6.5 oz) (09/30 0500)  Physical Exam: BP Readings from Last 1 Encounters:  07/01/16 (!) 97/42    Wt Readings from Last 1 Encounters:  07/01/16 109.5 kg (241 lb 6.5 oz)    Weight change: 2.5 kg (5 lb 8.2 oz)  HEENT: [place]/AT, Eyes-Brown, PERL, EOMI, Conjunctiva-Pale, Sclera-Non-icteric Neck: No JVD, No bruit, Trachea midline. Lungs:  Clearing, Bilateral. Cardiac: Irregular rhythm, normal S1 and S2, no S3. II/VI systolic murmur. Abdomen:  Soft, non-tender. Extremities:  No edema present. No cyanosis. No clubbing. CNS: AxOx3, Cranial nerves grossly intact, moves all 4 extremities.  Skin: Warm and dry.   Intake/Output from previous day: 09/29 0701 - 09/30 0700 In: 2130 [P.O.:600; I.V.:1200; IV Piggyback:200] Out: 1450 [Urine:1450]    Lab Results: BMET    Component Value Date/Time   NA 137 07/01/2016 0353   NA 134 (L) 06/30/2016 0409   NA 137 06/29/2016 0353   NA 139 06/21/2016 1039   NA 138 05/31/2016 1211   NA 143 05/22/2016 0935   K 3.6 07/01/2016 0353   K 3.8 06/30/2016 0409   K 3.8 06/29/2016 0353   K 4.2 06/21/2016 1039   K 4.1 05/31/2016 1211   K 3.9 05/22/2016 0935   CL 108 07/01/2016 0353   CL 105 06/30/2016 0409   CL 107 06/29/2016 0353   CO2 23 07/01/2016 0353   CO2 22 06/30/2016 0409   CO2 24 06/29/2016 0353   CO2 19 (L) 06/21/2016 1039   CO2 19 (L) 05/31/2016 1211   CO2 24 05/22/2016 0935   GLUCOSE 120 (H) 07/01/2016 0353    GLUCOSE 145 (H) 06/30/2016 0409   GLUCOSE 123 (H) 06/29/2016 0353   GLUCOSE 246 (H) 06/21/2016 1039   GLUCOSE 201 (H) 05/31/2016 1211   GLUCOSE 107 05/22/2016 0935   BUN 13 07/01/2016 0353   BUN 15 06/30/2016 0409   BUN 20 06/29/2016 0353   BUN 17.0 06/21/2016 1039   BUN 13.7 05/31/2016 1211   BUN 12.1 05/22/2016 0935   CREATININE 1.15 07/01/2016 0353   CREATININE 1.14 06/30/2016 0409   CREATININE 1.01 06/29/2016 0353   CREATININE 1.4 (H) 06/21/2016 1039   CREATININE 1.2 05/31/2016 1211   CREATININE 1.2 05/22/2016 0935   CALCIUM 8.4 (L) 07/01/2016 0353   CALCIUM 8.2 (L) 06/30/2016 0409   CALCIUM 8.3 (L) 06/29/2016 0353   CALCIUM 9.4 06/21/2016 1039   CALCIUM 9.5 05/31/2016 1211   CALCIUM 9.9 05/22/2016 0935   GFRNONAA 60 (L) 07/01/2016 0353   GFRNONAA >60 06/30/2016 0409   GFRNONAA >60 06/29/2016 0353   GFRAA >60 07/01/2016 0353   GFRAA >60 06/30/2016 0409   GFRAA >60 06/29/2016 0353   CBC    Component Value Date/Time   WBC 9.0 07/01/2016 0353   RBC 2.90 (L) 07/01/2016 0353   HGB 9.2 (L) 07/01/2016 0353   HGB 11.8 (L) 06/21/2016 1038   HCT 26.8 (L) 07/01/2016 0353   HCT  34.1 (L) 06/21/2016 1038   PLT 49 (L) 07/01/2016 0353   PLT 195 06/21/2016 1038   MCV 92.4 07/01/2016 0353   MCV 91.7 06/21/2016 1038   MCH 31.7 07/01/2016 0353   MCHC 34.3 07/01/2016 0353   RDW 13.9 07/01/2016 0353   RDW 14.2 06/21/2016 1038   LYMPHSABS 0.3 (L) 06/28/2016 0946   LYMPHSABS 0.3 (L) 06/21/2016 1038   MONOABS 0.6 06/28/2016 0946   MONOABS 0.1 06/21/2016 1038   EOSABS 0.0 06/28/2016 0946   EOSABS 0.0 06/21/2016 1038   BASOSABS 0.0 06/28/2016 0946   BASOSABS 0.0 06/21/2016 1038   HEPATIC Function Panel  Recent Labs  06/21/16 1039 06/28/16 0946 06/29/16 0353  PROT 6.7 7.1 5.6*   HEMOGLOBIN A1C No components found for: HGA1C,  MPG CARDIAC ENZYMES No results found for: CKTOTAL, CKMB, CKMBINDEX, TROPONINI BNP No results for input(s): PROBNP in the last 8760  hours. TSH  Recent Labs  11/21/15 0541 06/28/16 1443  TSH 2.013 1.193   CHOLESTEROL No results for input(s): CHOL in the last 8760 hours.  Scheduled Meds: . amiodarone  400 mg Oral Q8H  . aspirin EC  81 mg Oral Daily  . brinzolamide  2 drop Both Eyes BID   And  . brimonidine  2 drop Both Eyes BID  . Chlorhexidine Gluconate Cloth  6 each Topical Q0600  . digoxin  0.25 mg Oral Daily  . diltiazem  180 mg Oral BID  . dorzolamide  1 drop Left Eye BID  . folic acid  1 mg Oral Daily  . insulin aspart  0-15 Units Subcutaneous TID WC  . insulin aspart  0-5 Units Subcutaneous QHS  . latanoprost  1 drop Both Eyes QHS  . metoprolol tartrate  50 mg Oral BID  . mupirocin ointment  1 application Nasal BID  . pantoprazole  40 mg Oral Daily  . piperacillin-tazobactam (ZOSYN)  IV  3.375 g Intravenous Q8H  . polyethylene glycol  17 g Oral Daily  . tamsulosin  0.4 mg Oral QHS  . [START ON 07/02/2016] vancomycin  1,000 mg Intravenous Q12H  . vitamin B-12  1,000 mcg Oral Daily   Continuous Infusions: . sodium chloride 50 mL/hr at 07/01/16 0800  . diltiazem (CARDIZEM) infusion Stopped (06/29/16 1340)   PRN Meds:.acetaminophen **OR** acetaminophen, ondansetron **OR** ondansetron (ZOFRAN) IV, prochlorperazine, senna-docusate  Assessment/Plan: Sinus rhythm with with frequent APCs Non small cell CA with mets. Thrombocytopenia Leukopenia in immunocompromised patient DM, II with hyperglycemia Metabolic acidosis-improving. MRSA/Sepsis Constipation  Continue medical treatment.   LOS: 3 days    [name]  MD  07/01/2016, 4:22 PM

## 2016-07-01 NOTE — Progress Notes (Signed)
Patient ID: Matthew Berger, male   DOB: 12-Jul-1936, 80 y.o.   MRN: [id number]    PROGRESS NOTE    Matthew Berger  YIR:[id number] DOB: 1935-11-27 DOA: 06/28/2016  PCP: [name], MD   Brief Narrative:  80 y.o. male currently being treated with maintenance chemotherapy for stage IV lung cancer with metastases who presented to the emergency Department complaining of progressive weakness and dyspnea on exertion. The patient had chemotherapy last week and tolerated it well. He continues to have difficulty with ambulating mostly due to dyspnea but he currently denies chest pain and he also denies dyspnea at rest.   In the emergency department he was found to be clinically dehydrated with an acute renal insufficiency. He was also noted to be in atrial fibrillation with RVR. He was started on an IV diltiazem infusion to control his heart rate.   Assessment & Plan:  Sepsis of unclear etiology - please note that pt met sepsis criteria on admission, Tachycardia, fever, leukopenia in immunocompromised pt - source unclear  - CT chest with no clear evidence of PNA but progressive metastatic lung cancer  - placed on empiric ABX vanc and zosyn and will continue same regimen for now - BP stable this AM, no fevers, WBC is WNL  keep in SDU today   Multifocal atrial tachycardia - per cardiology unlikely to be Atrial fibrillation with RVR. (CHA2DS2-VASc Score is 3 with yearly risk of stroke of 3.2%. - suspect this is related to underlying lung cancer with mets - pt transitioned to oral Cardizem 80 mg PO QD, metoprolol dose also increased from 25 mg PO BID to 50 mg PO BID  - no plan for anticoagulation due to thrombocytopenia  - BP soft and we can support with IVF for now but if persistent hypotension, would hold at least one of those meds to see how pt does  Chronic diastolic CHF - with last ECHO 11/2015 with stable EF 60% and grade I diastolic CHF - carefull with IVF as weight is up from 228 --> 235  --> 241 lbs this AM - if BP low, would avoid further boluses and stop cardizem vs metoprolol  - if BP remains stable, d/c IVF  Acute kidney injury - pre renal in etiology in the setting of sepsis - resolved with IVF - BMP in AM  Acute hypoxic respiratory failure due to lung cancer, ? MAT vs a-fib  - pt with known stage IV (T3, N2, M1b) non-small cell lung cancer, adenocarcinoma  - CT chest with progressive lung cancer - pt follows with Dr. [name], he was notified of pt's admission via EPIC  - may need to consider PCT input but will discuss with pt's oncologist first per pt's wishes   Essential hypertension - hypotensive overnight, discuss with cardiology if we can stop one of the meds: Cardizem or metoprolol until BP stabilizes   Pancytopenia   - from chemotherapy  - pt on Lovenox SQ for DVT prophylaxis, will stop and place on SCD's - monitor counts, CBC in AM   DVT prophylaxis: SCD's Code Status: Full  Family Communication: Patient at bedside, family at bedside  Disposition Plan: Keep in SDU  Consultants:   Cardiology   Procedures:   None  Antimicrobials:   Vancomycin 9/28 -->  Zosyn 9/28 -->   Subjective: Reports he feels tired, still with exertional dyspnea.   Objective: Vitals:   07/01/16 1700 07/01/16 1800 07/01/16 1845 07/01/16 1900  BP: 110/60 106/66  (!) 105/52  Pulse: 67 88 75 90  Resp: (!) 29 (!) 21 (!) 26 (!) 30  Temp:      TempSrc:      SpO2: 96% 96% 96% 95%  Weight:      Height:        Intake/Output Summary (Last 24 hours) at 07/01/16 1930 Last data filed at 07/01/16 1900  Gross per 24 hour  Intake             2080 ml  Output             1550 ml  Net              530 ml   Filed Weights   06/29/16 0500 06/30/16 0230 07/01/16 0500  Weight: 103.5 kg (228 lb 2.8 oz) 107 kg (235 lb 14.3 oz) 109.5 kg (241 lb 6.5 oz)    Examination:  General exam: Appears to be resting this AM, NAD Respiratory system: diminished breath sounds at bases  with scattered rhonchi  Cardiovascular system: IRRR. No rubs, gallops or clicks. Mild pedal edema.  Gastrointestinal system: Abdomen is nondistended, soft and nontender. No organomegaly or masses felt.  Central nervous system: Alert and oriented.   Data Reviewed: I have personally reviewed following labs and imaging studies  CBC:  Recent Labs Lab 06/28/16 0946 06/29/16 0353 06/30/16 0409 07/01/16 0353  WBC 2.8* 3.8* 7.6 9.0  NEUTROABS 1.9  --   --   --   HGB 12.6* 10.2* 9.5* 9.2*  HCT 36.3* 29.0* 27.8* 26.8*  MCV 91.2 89.2 93.0 92.4  PLT 75* 52* 43* 49*   Basic Metabolic Panel:  Recent Labs Lab 06/28/16 0946 06/28/16 1443 06/29/16 0353 06/30/16 0409 07/01/16 0353  NA 136  --  137 134* 137  K 4.2  --  3.8 3.8 3.6  CL 104  --  107 105 108  CO2 21*  --  '24 22 23  '$ GLUCOSE 247*  --  123* 145* 120*  BUN 27*  --  '20 15 13  '$ CREATININE 1.45*  --  1.01 1.14 1.15  CALCIUM 9.2  --  8.3* 8.2* 8.4*  MG 1.8 1.8  --   --   --   PHOS  --  2.5  --   --   --    Liver Function Tests:  Recent Labs Lab 06/28/16 0946 06/29/16 0353  AST 19 17  ALT 12* 12*  ALKPHOS 57 46  BILITOT 2.4* 1.0  PROT 7.1 5.6*  ALBUMIN 3.5 2.9*    Recent Labs Lab 06/28/16 0946  LIPASE 18   CBG:  Recent Labs Lab 06/30/16 1653 06/30/16 2253 07/01/16 0817 07/01/16 1231 07/01/16 1550  GLUCAP 117* 127* 107* 151* 106*   Thyroid Function Tests: No results for input(s): TSH, T4TOTAL, FREET4, T3FREE, THYROIDAB in the last 72 hours. Urine analysis:    Component Value Date/Time   COLORURINE AMBER (A) 06/28/2016 0950   APPEARANCEUR CLOUDY (A) 06/28/2016 0950   LABSPEC 1.022 06/28/2016 0950   PHURINE 6.0 06/28/2016 0950   GLUCOSEU 500 (A) 06/28/2016 0950   HGBUR NEGATIVE 06/28/2016 0950   BILIRUBINUR NEGATIVE 06/28/2016 0950   KETONESUR NEGATIVE 06/28/2016 0950   PROTEINUR 30 (A) 06/28/2016 0950   UROBILINOGEN 1.0 08/25/2010 1351   NITRITE NEGATIVE 06/28/2016 0950   LEUKOCYTESUR NEGATIVE  06/28/2016 0950   Recent Results (from the past 240 hour(s))  MRSA PCR Screening     Status: Abnormal   Collection Time: 06/28/16  2:48 PM  Result Value  Ref Range Status   MRSA by PCR POSITIVE (A) NEGATIVE Final    Comment:        The GeneXpert MRSA Assay (FDA approved for NASAL specimens only), is one component of a comprehensive MRSA colonization surveillance program. It is not intended to diagnose MRSA infection nor to guide or monitor treatment for MRSA infections. RESULT CALLED TO, READ BACK BY AND VERIFIED WITH: MAYS,S. RN '@[id number]'$  ON 9.28.17 BY MCCOY,N.   Culture, blood (x 2)     Status: None (Preliminary result)   Collection Time: 06/29/16  1:40 PM  Result Value Ref Range Status   Specimen Description BLOOD LEFT ARM  Final   Special Requests BOTTLES DRAWN AEROBIC AND ANAEROBIC  5CC  Final   Culture   Final    NO GROWTH 2 DAYS Performed at [facility]    Report Status PENDING  Incomplete  Culture, blood (x 2)     Status: None (Preliminary result)   Collection Time: 06/29/16  1:43 PM  Result Value Ref Range Status   Specimen Description BLOOD RIGHT ARM  Final   Special Requests BOTTLES DRAWN AEROBIC AND ANAEROBIC  5CC  Final   Culture   Final    NO GROWTH 2 DAYS Performed at [facility]    Report Status PENDING  Incomplete      Radiology Studies: No results found.    Scheduled Meds: . amiodarone  400 mg Oral Q8H  . aspirin EC  81 mg Oral Daily  . brinzolamide  2 drop Both Eyes BID   And  . brimonidine  2 drop Both Eyes BID  . Chlorhexidine Gluconate Cloth  6 each Topical Q0600  . [START ON 07/02/2016] digoxin  0.125 mg Oral Daily  . diltiazem  180 mg Oral BID  . dorzolamide  1 drop Left Eye BID  . folic acid  1 mg Oral Daily  . insulin aspart  0-15 Units Subcutaneous TID WC  . insulin aspart  0-5 Units Subcutaneous QHS  . latanoprost  1 drop Both Eyes QHS  . metoprolol tartrate  50 mg Oral BID  . mupirocin ointment  1 application Nasal  BID  . pantoprazole  40 mg Oral Daily  . piperacillin-tazobactam (ZOSYN)  IV  3.375 g Intravenous Q8H  . polyethylene glycol  17 g Oral Daily  . tamsulosin  0.4 mg Oral QHS  . [START ON 07/02/2016] vancomycin  1,000 mg Intravenous Q12H  . vitamin B-12  1,000 mcg Oral Daily   Continuous Infusions: . sodium chloride 50 mL/hr at 07/01/16 1752     LOS: 3 days    Time spent: 20 minutes    Faye Ramsay, MD [facility] Pager [phone]  If 7PM-7AM, please contact night-coverage [email or web address] Password [email or web address] 07/01/2016, 7:30 PM

## 2016-07-01 NOTE — Progress Notes (Signed)
Pharmacy Antibiotic Note  Matthew Berger is a 80 y.o. male with hx of NSCL currently undergoing chemotherapy, presented to the ED on 9/28 with c/o dizziness and weakness.  He was found to be on  afib-- s/p adenosine on 9/27 and now being managed by cardiology.  Patient is febrile.  To start vancomycin and zosyn for suspected sepsis.  -  9/28 CXR: no PNA - Tmax 100.7 - WBC now up into nml range - SCr normalized with hydration(CrCl~55 N), ANC 1.9, LA improved - Vanc trough below goal 15-73mg/ml on '750mg'$  q12h  Plan: - Increase vancomycin to 1g IV q12h - Cont Zosyn 3.375g IV Q8H infused over 4hrs. - Recheck Vanc trough at steady state. - Follow up renal fxn, culture results, and clinical course.  ___________________________  Height: '6\' 2"'$  (188 cm) Weight: 241 lb 6.5 oz (109.5 kg) IBW/kg (Calculated) : 82.2  Temp (24hrs), Avg:98.7 F (37.1 C), Min:98.3 F (36.8 C), Max:99.2 F (37.3 C)   Recent Labs Lab 06/28/16 0946 06/28/16 1003 06/28/16 1443 06/28/16 1713 06/29/16 0353 06/29/16 1325 06/29/16 1734 06/30/16 0409 07/01/16 0353 07/01/16 1327  WBC 2.8*  --   --   --  3.8*  --   --  7.6 9.0  --   CREATININE 1.45*  --   --   --  1.01  --   --  1.14 1.15  --   LATICACIDVEN  --  3.23* 1.6 2.4*  --  2.9* 1.6  --   --   --   VANCOTROUGH  --   --   --   --   --   --   --   --   --  12*    Estimated Creatinine Clearance: 70.8 mL/min (by C-G formula based on SCr of 1.15 mg/dL).    No Known Allergies  Antimicrobials this admission:  Vanc 9/28 >>  Zosyn 9/28 >>   Dose adjustments this admission:  9/30: VT = 12 on '750mg'$  q12h, change to 1g q12h.  Microbiology results:  9/28 BCx: ngtd 9/28 MRSA PCR (+)  Thank you for allowing pharmacy to be a part of this patient's care.  TRomeo Rabon PharmD, pager [phone] 07/01/2016,2:02 PM.

## 2016-07-02 LAB — BASIC METABOLIC PANEL
Anion gap: 6 (ref 5–15)
BUN: 10 mg/dL (ref 6–20)
CHLORIDE: 107 mmol/L (ref 101–111)
CO2: 24 mmol/L (ref 22–32)
CREATININE: 0.95 mg/dL (ref 0.61–1.24)
Calcium: 8.7 mg/dL — ABNORMAL LOW (ref 8.9–10.3)
GFR calc Af Amer: >60 mL/min (ref >60)
Glucose, Bld: 105 mg/dL — ABNORMAL HIGH (ref 65–99)
Potassium: 3.7 mmol/L (ref 3.5–5.1)
SODIUM: 137 mmol/L (ref 135–145)

## 2016-07-02 LAB — CBC
HCT: 25.6 % — ABNORMAL LOW (ref 39.0–52.0)
Hemoglobin: 8.8 g/dL — ABNORMAL LOW (ref 13.0–17.0)
MCH: 31.9 pg (ref 26.0–34.0)
MCHC: 34.4 g/dL (ref 30.0–36.0)
MCV: 92.8 fL (ref 78.0–100.0)
PLATELETS: 55 10*3/uL — AB (ref 150–400)
RBC: 2.76 MIL/uL — ABNORMAL LOW (ref 4.22–5.81)
RDW: 14.2 % (ref 11.5–15.5)
WBC: 6.7 10*3/uL (ref 4.0–10.5)

## 2016-07-02 LAB — GLUCOSE, CAPILLARY
GLUCOSE-CAPILLARY: 107 mg/dL — AB (ref 65–99)
GLUCOSE-CAPILLARY: 106 mg/dL — AB (ref 65–99)
Glucose-Capillary: 160 mg/dL — ABNORMAL HIGH (ref 65–99)
Glucose-Capillary: 134 mg/dL — ABNORMAL HIGH (ref 65–99)

## 2016-07-02 NOTE — Progress Notes (Signed)
Ref: Matthew Panda, MD   Subjective:  Sitting up. No chest pain or leg swelling. Heart rate in 70's with frequent APCs.  Objective:  Vital Signs in the last 24 hours: Temp:  [98.6 F (37 C)-100.2 F (37.9 C)] 100.2 F (37.9 C) (10/01 0800) Pulse Rate:  [66-132] 69 (10/01 1200) Cardiac Rhythm: Atrial fibrillation (10/01 1200) Resp:  [19-30] 19 (10/01 1200) BP: (89-130)/(37-84) 97/56 (10/01 1200) SpO2:  [92 %-98 %] 93 % (10/01 1200) Weight:  [111.3 kg (245 lb 6 oz)] 111.3 kg (245 lb 6 oz) (10/01 0500)  Physical Exam: BP Readings from Last 1 Encounters:  07/02/16 (!) 97/56    Wt Readings from Last 1 Encounters:  07/02/16 111.3 kg (245 lb 6 oz)    Weight change: 1.8 kg (3 lb 15.5 oz)  HEENT: [place]/AT, Eyes-Brown, PERL, EOMI, Conjunctiva-Pale, Sclera-Non-icteric Neck: No JVD, No bruit, Trachea midline. Lungs:  Clearing, Bilateral. Cardiac:  Regular rhythm, normal S1 and S2, no S3. II/VI systolic murmur. Abdomen:  Soft, non-tender. Extremities:  No edema present. No cyanosis. No clubbing. CNS: AxOx3, Cranial nerves grossly intact, moves all 4 extremities.  Skin: Warm and dry.   Intake/Output from previous day: 09/30 0701 - 10/01 0700 In: 1870 [P.O.:340; I.V.:1200; IV Piggyback:200] Out: 6546 [Urine:1675]    Lab Results: BMET    Component Value Date/Time   NA 137 07/02/2016 0344   NA 137 07/01/2016 0353   NA 134 (L) 06/30/2016 0409   NA 139 06/21/2016 1039   NA 138 05/31/2016 1211   NA 143 05/22/2016 0935   K 3.7 07/02/2016 0344   K 3.6 07/01/2016 0353   K 3.8 06/30/2016 0409   K 4.2 06/21/2016 1039   K 4.1 05/31/2016 1211   K 3.9 05/22/2016 0935   CL 107 07/02/2016 0344   CL 108 07/01/2016 0353   CL 105 06/30/2016 0409   CO2 24 07/02/2016 0344   CO2 23 07/01/2016 0353   CO2 22 06/30/2016 0409   CO2 19 (L) 06/21/2016 1039   CO2 19 (L) 05/31/2016 1211   CO2 24 05/22/2016 0935   GLUCOSE 105 (H) 07/02/2016 0344   GLUCOSE 120 (H) 07/01/2016 0353   GLUCOSE 145 (H)  06/30/2016 0409   GLUCOSE 246 (H) 06/21/2016 1039   GLUCOSE 201 (H) 05/31/2016 1211   GLUCOSE 107 05/22/2016 0935   BUN 10 07/02/2016 0344   BUN 13 07/01/2016 0353   BUN 15 06/30/2016 0409   BUN 17.0 06/21/2016 1039   BUN 13.7 05/31/2016 1211   BUN 12.1 05/22/2016 0935   CREATININE 0.95 07/02/2016 0344   CREATININE 1.15 07/01/2016 0353   CREATININE 1.14 06/30/2016 0409   CREATININE 1.4 (H) 06/21/2016 1039   CREATININE 1.2 05/31/2016 1211   CREATININE 1.2 05/22/2016 0935   CALCIUM 8.7 (L) 07/02/2016 0344   CALCIUM 8.4 (L) 07/01/2016 0353   CALCIUM 8.2 (L) 06/30/2016 0409   CALCIUM 9.4 06/21/2016 1039   CALCIUM 9.5 05/31/2016 1211   CALCIUM 9.9 05/22/2016 0935   GFRNONAA >60 07/02/2016 0344   GFRNONAA 60 (L) 07/01/2016 0353   GFRNONAA >60 06/30/2016 0409   GFRAA >60 07/02/2016 0344   GFRAA >60 07/01/2016 0353   GFRAA >60 06/30/2016 0409   CBC    Component Value Date/Time   WBC 6.7 07/02/2016 0344   RBC 2.76 (L) 07/02/2016 0344   HGB 8.8 (L) 07/02/2016 0344   HGB 11.8 (L) 06/21/2016 1038   HCT 25.6 (L) 07/02/2016 0344   HCT 34.1 (L) 06/21/2016 1038  PLT 55 (L) 07/02/2016 0344   PLT 195 06/21/2016 1038   MCV 92.8 07/02/2016 0344   MCV 91.7 06/21/2016 1038   MCH 31.9 07/02/2016 0344   MCHC 34.4 07/02/2016 0344   RDW 14.2 07/02/2016 0344   RDW 14.2 06/21/2016 1038   LYMPHSABS 0.3 (L) 06/28/2016 0946   LYMPHSABS 0.3 (L) 06/21/2016 1038   MONOABS 0.6 06/28/2016 0946   MONOABS 0.1 06/21/2016 1038   EOSABS 0.0 06/28/2016 0946   EOSABS 0.0 06/21/2016 1038   BASOSABS 0.0 06/28/2016 0946   BASOSABS 0.0 06/21/2016 1038   HEPATIC Function Panel  Recent Labs  06/21/16 1039 06/28/16 0946 06/29/16 0353  PROT 6.7 7.1 5.6*   HEMOGLOBIN A1C No components found for: HGA1C,  MPG CARDIAC ENZYMES No results found for: CKTOTAL, CKMB, CKMBINDEX, TROPONINI BNP No results for input(s): PROBNP in the last 8760 hours. TSH  Recent Labs  11/21/15 0541 06/28/16 1443  TSH  2.013 1.193   CHOLESTEROL No results for input(s): CHOL in the last 8760 hours.  Scheduled Meds: . amiodarone  400 mg Oral Q8H  . aspirin EC  81 mg Oral Daily  . brinzolamide  2 drop Both Eyes BID   And  . brimonidine  2 drop Both Eyes BID  . Chlorhexidine Gluconate Cloth  6 each Topical Q0600  . digoxin  0.125 mg Oral Daily  . diltiazem  180 mg Oral BID  . dorzolamide  1 drop Left Eye BID  . folic acid  1 mg Oral Daily  . insulin aspart  0-15 Units Subcutaneous TID WC  . insulin aspart  0-5 Units Subcutaneous QHS  . latanoprost  1 drop Both Eyes QHS  . metoprolol tartrate  50 mg Oral BID  . mupirocin ointment  1 application Nasal BID  . pantoprazole  40 mg Oral Daily  . piperacillin-tazobactam (ZOSYN)  IV  3.375 g Intravenous Q8H  . polyethylene glycol  17 g Oral Daily  . tamsulosin  0.4 mg Oral QHS  . vancomycin  1,000 mg Intravenous Q12H  . vitamin B-12  1,000 mcg Oral Daily   Continuous Infusions: . sodium chloride 50 mL/hr at 07/01/16 1752   PRN Meds:.acetaminophen **OR** acetaminophen, ondansetron **OR** ondansetron (ZOFRAN) IV, prochlorperazine, senna-docusate  Assessment/Plan: Sinus rhythm with frequent APCs. Non-small cell CA with mets Thrombocytopenia Leukopenia/Immunocompromised patient DM, II, uncontrolled MRSA infection/sepsis Constipation  Continue medical treatment with Beta-blocker, Calcium channel blocker, amiodarone and lanoxin. After 2 days decrease amiodarone to 400 mg. bid and post 5 days decrease to 200 mg. bid x 1 week then one daily. May discontinue lanoxin in one week. Will follow as needed.   LOS: 4 days    [name]  MD  07/02/2016, 1:24 PM

## 2016-07-02 NOTE — Progress Notes (Signed)
Patient ID: Matthew Berger, male   DOB: [birth year], 80 y.o.   MRN: [id number]    PROGRESS NOTE    Matthew Berger  NOM:[id number] DOB: [birth year] DOA: 06/28/2016  PCP: [name], MD   Brief Narrative:  80 y.o. male currently being treated with maintenance chemotherapy for stage IV lung cancer with metastases who presented to the emergency Department complaining of progressive weakness and dyspnea on exertion. The patient had chemotherapy last week and tolerated it well. He continues to have difficulty with ambulating mostly due to dyspnea but he currently denies chest pain and he also denies dyspnea at rest.   In the emergency department he was found to be clinically dehydrated with an acute renal insufficiency. He was also noted to be in atrial fibrillation with RVR. He was started on an IV diltiazem infusion to control his heart rate.   Assessment & Plan:  Sepsis of unclear etiology - please note that pt met sepsis criteria on admission, Tachycardia, fever, leukopenia in immunocompromised pt - source unclear  - CT chest with no clear evidence of PNA but progressive metastatic lung cancer  - placed on empiric ABX vanc and zosyn, still with Tmax 100.2, continue same regimen  - BP slightly low this MA, WBC is WNL  Multifocal atrial tachycardia - per cardiology unlikely to be Atrial fibrillation with RVR. (CHA2DS2-VASc Score is 3 with yearly risk of stroke of 3.2%. - suspect this is related to underlying lung cancer with mets - pt transitioned to oral Cardizem 80 mg PO QD, metoprolol dose also increased from 25 mg PO BID to 50 mg PO BID  - no plan for anticoagulation due to thrombocytopenia  - BP soft and we can support with IVF for now but if persistent hypotension, would hold at least one of those meds to see how pt does  Chronic diastolic CHF - with last ECHO 11/2015 with stable EF 60% and grade I diastolic CHF - carefull with IVF as weight is up from 228 --> 235 --> 241 --> 245  lbs this AM - if BP low, would avoid further boluses and stop cardizem vs metoprolol   Acute kidney injury - pre renal in etiology in the setting of sepsis - resolved with IVF - BMP in AM  Acute hypoxic respiratory failure due to lung cancer, ? MAT vs a-fib  - pt with known stage IV (T3, N2, M1b) non-small cell lung cancer, adenocarcinoma  - CT chest with progressive lung cancer - pt follows with Dr. [name], he was notified of pt's admission via EPIC  - may need to consider PCT input but will discuss with pt's oncologist first per pt's wishes  - family wants to talk to Dr. [name], will ask if he can come to see pt   Essential hypertension - hypotensive overnight, discuss with cardiology if we can stop one of the meds: Cardizem or metoprolol until BP stabilizes   Pancytopenia   - from chemotherapy  - pt on Lovenox SQ for DVT prophylaxis, will stop and place on SCD's - monitor counts, CBC in AM   DVT prophylaxis: SCD's Code Status: Full  Family Communication: Patient at bedside, family at bedside  Disposition Plan: Keep in SDU  Consultants:   Cardiology   Procedures:   None  Antimicrobials:   Vancomycin 9/28 -->  Zosyn 9/28 -->  Subjective: Reports he feels better but still with exertional dyspnea.   Objective: Vitals:   07/02/16 0600 07/02/16 0800 07/02/16 0950 07/02/16 1200  BP: 123/84 (!) 124/42 122/60 (!) 97/56  Pulse: 68 93 (!) 107 69  Resp: (!) 24 20 (!) 24 19  Temp:  100.2 F (37.9 C)    TempSrc:  Oral    SpO2: 98% 95% 92% 93%  Weight:      Height:        Intake/Output Summary (Last 24 hours) at 07/02/16 1348 Last data filed at 07/02/16 1200  Gross per 24 hour  Intake             1810 ml  Output             1875 ml  Net              -65 ml   Filed Weights   06/30/16 0230 07/01/16 0500 07/02/16 0500  Weight: 107 kg (235 lb 14.3 oz) 109.5 kg (241 lb 6.5 oz) 111.3 kg (245 lb 6 oz)    Examination:  General exam: Appears to be resting this  AM, NAD Respiratory system: diminished breath sounds at bases with scattered rhonchi  Cardiovascular system: IRRR. No rubs, gallops or clicks. Mild pedal edema.  Gastrointestinal system: Abdomen is nondistended, soft and nontender. No organomegaly or masses felt.  Central nervous system: Alert and oriented.   Data Reviewed: I have personally reviewed following labs and imaging studies  CBC:  Recent Labs Lab 06/28/16 0946 06/29/16 0353 06/30/16 0409 07/01/16 0353 07/02/16 0344  WBC 2.8* 3.8* 7.6 9.0 6.7  NEUTROABS 1.9  --   --   --   --   HGB 12.6* 10.2* 9.5* 9.2* 8.8*  HCT 36.3* 29.0* 27.8* 26.8* 25.6*  MCV 91.2 89.2 93.0 92.4 92.8  PLT 75* 52* 43* 49* 55*   Basic Metabolic Panel:  Recent Labs Lab 06/28/16 0946 06/28/16 1443 06/29/16 0353 06/30/16 0409 07/01/16 0353 07/02/16 0344  NA 136  --  137 134* 137 137  K 4.2  --  3.8 3.8 3.6 3.7  CL 104  --  107 105 108 107  CO2 21*  --  _0 GLUCOSE 247*  --  123* 145* 120* 105*  BUN 27*  --  _1 CREATININE 1.45*  --  1.01 1.14 1.15 0.95  CALCIUM 9.2  --  8.3* 8.2* 8.4* 8.7*  MG 1.8 1.8  --   --   --   --   PHOS  --  2.5  --   --   --   --    Liver Function Tests:  Recent Labs Lab 06/28/16 0946 06/29/16 0353  AST 19 17  ALT 12* 12*  ALKPHOS 57 46  BILITOT 2.4* 1.0  PROT 7.1 5.6*  ALBUMIN 3.5 2.9*    Recent Labs Lab 06/28/16 0946  LIPASE 18   CBG:  Recent Labs Lab 07/01/16 1231 07/01/16 1550 07/01/16 2128 07/02/16 0732 07/02/16 1155  GLUCAP 151* 106* 129* 107* 160*   Urine analysis:    Component Value Date/Time   COLORURINE AMBER (A) 06/28/2016 0950   APPEARANCEUR CLOUDY (A) 06/28/2016 0950   LABSPEC 1.022 06/28/2016 0950   PHURINE 6.0 06/28/2016 0950   GLUCOSEU 500 (A) 06/28/2016 0950   HGBUR NEGATIVE 06/28/2016 0950   BILIRUBINUR NEGATIVE 06/28/2016 0950   KETONESUR NEGATIVE 06/28/2016 0950   PROTEINUR 30 (A) 06/28/2016 0950   UROBILINOGEN 1.0 08/25/2010 1351   NITRITE  NEGATIVE 06/28/2016 0950   LEUKOCYTESUR NEGATIVE 06/28/2016 0950   Recent Results (from the past 240 hour(s))  MRSA  PCR Screening     Status: Abnormal   Collection Time: 06/28/16  2:48 PM  Result Value Ref Range Status   MRSA by PCR POSITIVE (A) NEGATIVE Final    Comment:        The GeneXpert MRSA Assay (FDA approved for NASAL specimens only), is one component of a comprehensive MRSA colonization surveillance program. It is not intended to diagnose MRSA infection nor to guide or monitor treatment for MRSA infections. RESULT CALLED TO, READ BACK BY AND VERIFIED WITH: MAYS,S. RN _0  ON 9.28.17 BY MCCOY,N.   Culture, blood (x 2)     Status: None (Preliminary result)   Collection Time: 06/29/16  1:40 PM  Result Value Ref Range Status   Specimen Description BLOOD LEFT ARM  Final   Special Requests BOTTLES DRAWN AEROBIC AND ANAEROBIC  5CC  Final   Culture   Final    NO GROWTH 2 DAYS Performed at [facility]    Report Status PENDING  Incomplete  Culture, blood (x 2)     Status: None (Preliminary result)   Collection Time: 06/29/16  1:43 PM  Result Value Ref Range Status   Specimen Description BLOOD RIGHT ARM  Final   Special Requests BOTTLES DRAWN AEROBIC AND ANAEROBIC  5CC  Final   Culture   Final    NO GROWTH 2 DAYS Performed at [facility]    Report Status PENDING  Incomplete      Radiology Studies: No results found.    Scheduled Meds: . amiodarone  400 mg Oral Q8H  . aspirin EC  81 mg Oral Daily  . brinzolamide  2 drop Both Eyes BID   And  . brimonidine  2 drop Both Eyes BID  . Chlorhexidine Gluconate Cloth  6 each Topical Q0600  . digoxin  0.125 mg Oral Daily  . diltiazem  180 mg Oral BID  . dorzolamide  1 drop Left Eye BID  . folic acid  1 mg Oral Daily  . insulin aspart  0-15 Units Subcutaneous TID WC  . insulin aspart  0-5 Units Subcutaneous QHS  . latanoprost  1 drop Both Eyes QHS  . metoprolol tartrate  50 mg Oral BID  . mupirocin  ointment  1 application Nasal BID  . pantoprazole  40 mg Oral Daily  . piperacillin-tazobactam (ZOSYN)  IV  3.375 g Intravenous Q8H  . polyethylene glycol  17 g Oral Daily  . tamsulosin  0.4 mg Oral QHS  . vancomycin  1,000 mg Intravenous Q12H  . vitamin B-12  1,000 mcg Oral Daily   Continuous Infusions: . sodium chloride 50 mL/hr at 07/01/16 1752     LOS: 4 days    Time spent: 20 minutes    Faye Ramsay, MD [facility] Pager [phone]  If 7PM-7AM, please contact night-coverage [email or web address] Password [email or web address] 07/02/2016, 1:48 PM

## 2016-07-02 NOTE — Evaluation (Signed)
Physical Therapy Evaluation Patient Details Name: Matthew Berger MRN: [id number] DOB: 11/19/1935 Today's Date: 07/02/2016   History of Present Illness  80 y.o. male currently being treated with maintenance chemotherapy for stage IV lung cancer with metastases who presented to the emergency Department 06/28/16 complaining of progressive weakness and dyspnea on exertion.  Patient found to be dehydrated  and with ARI , in afib and RVR.  Clinical Impression  The patient is mobilizing well with RW. Did drop in oxygen saturation to 87% while on 4 liters while ambulating. Pt admitted with above diagnosis. Pt currently with functional limitations due to the deficits listed below (see PT Problem List).  Pt will benefit from skilled PT to increase their independence and safety with mobility to allow discharge to the venue listed below.       Follow Up Recommendations Home health PT;Supervision/Assistance - 24 hour    Equipment Recommendations  None recommended by PT    Recommendations for Other Services OT consult     Precautions / Restrictions Precautions Precautions: Fall Precaution Comments: monitor sats and HR      Mobility  Bed Mobility Overal bed mobility: Needs Assistance Bed Mobility: Sit to Supine       Sit to supine: Min assist   General bed mobility comments: assist legs onto bed  Transfers Overall transfer level: Needs assistance Equipment used: Rolling walker (2 wheeled) Transfers: Sit to/from Stand Sit to Stand: Min guard         General transfer comment: patient  stands without assistance  Ambulation/Gait Ambulation/Gait assistance: Min guard;+2 safety/equipment Ambulation Distance (Feet): 140 Feet Assistive device: Rolling walker (2 wheeled) Gait Pattern/deviations: Step-through pattern     General Gait Details: the patient ambulated with steady gait and RW. Sats  dropped from 100%  to 87 % while ambulating on 4 liters [place], encouraged  pursed lip breaths.    Stairs            Wheelchair Mobility    Modified Rankin (Stroke Patients Only)       Balance Overall balance assessment: Needs assistance Sitting-balance support: Feet supported;No upper extremity supported Sitting balance-Leahy Scale: Normal     Standing balance support: During functional activity;No upper extremity supported Standing balance-Leahy Scale: Fair                               Pertinent Vitals/Pain Pain Assessment: No/denies pain    Home Living Family/patient expects to be discharged to:: Private residence Living Arrangements: Spouse/significant other;Children Available Help at Discharge: Family Type of Home: House Home Access: Stairs to enter   Technical brewer of Steps: 4 Home Layout: One level Home Equipment: Cane - single point;Walker - 2 wheels      Prior Function                 Hand Dominance        Extremity/Trunk Assessment   Upper Extremity Assessment: Generalized weakness           Lower Extremity Assessment: Generalized weakness      Cervical / Trunk Assessment: Normal  Communication      Cognition Arousal/Alertness: Awake/alert Behavior During Therapy: WFL for tasks assessed/performed Overall Cognitive Status: Within Functional Limits for tasks assessed                      General Comments      Exercises     Assessment/Plan  PT Assessment Patient needs continued PT services  PT Problem List Decreased strength;Decreased activity tolerance;Decreased balance;Decreased mobility;Cardiopulmonary status limiting activity;Decreased knowledge of use of DME          PT Treatment Interventions DME instruction;Gait training;Functional mobility training;Therapeutic activities;Therapeutic exercise;Patient/family education    PT Goals (Current goals can be found in the Care Plan section)  Acute Rehab PT Goals Patient Stated Goal: to walk with my cane PT Goal Formulation: With  patient Time For Goal Achievement: 07/16/16 Potential to Achieve Goals: Good    Frequency Min 3X/week   Barriers to discharge        Co-evaluation               End of Session Equipment Utilized During Treatment: Oxygen;Gait belt Activity Tolerance: Patient tolerated treatment well Patient left: in bed;with call bell/phone within reach;with bed alarm set Nurse Communication: Mobility status         Time: 6812-7517 PT Time Calculation (min) (ACUTE ONLY): 22 min   Charges:   PT Evaluation $PT Eval Moderate Complexity: 1 Procedure     PT G CodesClaretha Cooper 07/02/2016, 3:12 PM Tresa Endo PT [phone]

## 2016-07-03 DIAGNOSIS — A419 Sepsis, unspecified organism: Principal | ICD-10-CM

## 2016-07-03 LAB — CBC
HCT: 26.2 % — ABNORMAL LOW (ref 39.0–52.0)
HEMOGLOBIN: 9 g/dL — AB (ref 13.0–17.0)
MCH: 32.4 pg (ref 26.0–34.0)
MCHC: 34.4 g/dL (ref 30.0–36.0)
MCV: 94.2 fL (ref 78.0–100.0)
PLATELETS: 83 10*3/uL — AB (ref 150–400)
RBC: 2.78 MIL/uL — AB (ref 4.22–5.81)
RDW: 14.2 % (ref 11.5–15.5)
WBC: 5.1 10*3/uL (ref 4.0–10.5)

## 2016-07-03 LAB — BASIC METABOLIC PANEL
ANION GAP: 9 (ref 5–15)
BUN: 10 mg/dL (ref 6–20)
CHLORIDE: 106 mmol/L (ref 101–111)
CO2: 23 mmol/L (ref 22–32)
Calcium: 8.6 mg/dL — ABNORMAL LOW (ref 8.9–10.3)
Creatinine, Ser: 0.99 mg/dL (ref 0.61–1.24)
GFR calc Af Amer: >60 mL/min (ref >60)
GLUCOSE: 107 mg/dL — AB (ref 65–99)
POTASSIUM: 3.5 mmol/L (ref 3.5–5.1)
Sodium: 138 mmol/L (ref 135–145)

## 2016-07-03 LAB — GLUCOSE, CAPILLARY
GLUCOSE-CAPILLARY: 108 mg/dL — AB (ref 65–99)
GLUCOSE-CAPILLARY: 150 mg/dL — AB (ref 65–99)
GLUCOSE-CAPILLARY: 154 mg/dL — AB (ref 65–99)
Glucose-Capillary: 149 mg/dL — ABNORMAL HIGH (ref 65–99)
Glucose-Capillary: 103 mg/dL — ABNORMAL HIGH (ref 65–99)

## 2016-07-03 MED ORDER — LEVOFLOXACIN 500 MG PO TABS
500.0000 mg | ORAL_TABLET | Freq: Every day | ORAL | Status: DC
  Administered 2016-07-03 – 2016-07-05 (×3): 500 mg via ORAL
  Filled 2016-07-03 (×4): qty 1

## 2016-07-03 NOTE — Progress Notes (Signed)
Date:  July 03, 2016 Chart reviewed for concurrent status and case management needs. Will continue to follow the patient for status change: hypotensive and iv boluses for support Discharge Planning: following for needs Expected discharge date: [id number] Velva Harman, [place], Cora, Elida

## 2016-07-03 NOTE — Progress Notes (Signed)
Patient ID: Matthew Berger, male   DOB: Sep 18, 1936, 80 y.o.   MRN: [id number]    PROGRESS NOTE    Matthew Berger  LEX:[id number] DOB: July 13, 1936 DOA: 06/28/2016  PCP: [name], MD   Brief Narrative:  80 y.o. male currently being treated with maintenance chemotherapy for stage IV lung cancer with metastases who presented to the emergency Department complaining of progressive weakness and dyspnea on exertion. The patient had chemotherapy last week and tolerated it well. He continues to have difficulty with ambulating mostly due to dyspnea but he currently denies chest pain and he also denies dyspnea at rest.   In the emergency department he was found to be clinically dehydrated with an acute renal insufficiency. He was also noted to be in atrial fibrillation with RVR. He was started on an IV diltiazem infusion to control his heart rate.   Assessment & Plan:  Sepsis of unclear etiology - please note that pt met sepsis criteria on admission, Tachycardia, fever, leukopenia in immunocompromised pt - source unclear  - CT chest with no clear evidence of PNA but progressive metastatic lung cancer  - placed on empiric ABX vanc and zosyn, no fevers overnight, WBC WNL - will transition to Levaquin PO today  - plan to transfer out of SDU to medical unit   Multifocal atrial tachycardia - per cardiology unlikely to be Atrial fibrillation with RVR. (CHA2DS2-VASc Score is 3 with yearly risk of stroke of 3.2%. - suspect this is related to underlying lung cancer with mets - pt transitioned to oral Cardizem 80 mg PO QD, metoprolol dose also increased from 25 mg PO BID to 50 mg PO BID  - no plan for anticoagulation due to thrombocytopenia  - HR has been relatively stable in the past 24 hours but still occasionally up to 120's  - pt prefers to be off cardiac monitor and I think this is reasonable, he was made aware that he needs to report any  New chest pain or worsening dyspnea, palpitations    Chronic diastolic CHF - with last ECHO 11/2015 with stable EF 60% and grade I diastolic CHF - carefull with IVF as weight is up from 228 --> 235 --> 241 --> 245 --> 240 lbs this AM - if BP low, would avoid further boluses and stop cardizem vs metoprolol   Acute kidney injury - pre renal in etiology in the setting of sepsis - resolved with IVF - BMP in AM  Acute hypoxic respiratory failure due to lung cancer, ? MAT vs a-fib  - pt with known stage IV (T3, N2, M1b) non-small cell lung cancer, adenocarcinoma  - CT chest with progressive lung cancer - pt follows with Dr. [name], appreciate his assistance   Essential hypertension - BP stable this AM 140/70  Pancytopenia   - from chemotherapy  - pt on Lovenox SQ for DVT prophylaxis which was changed to SCD's - Plt up this AM, Hg stable, no evidence of active bleeding - CBC in AM   DVT prophylaxis: SCD's Code Status: Full  Family Communication: Patient at bedside, family at bedside  Disposition Plan: transfer to medical unit   Consultants:   Cardiology   Procedures:   None  Antimicrobials:   Vancomycin 9/28 --> 10/01  Zosyn 9/28 --> 10/01  Levaquin 10/01 -->  Subjective: Reports he feels better.  Objective: Vitals:   07/03/16 0937 07/03/16 1000 07/03/16 1100 07/03/16 1156  BP:      Pulse: 88 (!) 142 76  Resp:  (!) 34 (!) 23   Temp:    98.2 F (36.8 C)  TempSrc:    Oral  SpO2:  92% 92%   Weight:      Height:        Intake/Output Summary (Last 24 hours) at 07/03/16 1334 Last data filed at 07/03/16 0940  Gross per 24 hour  Intake              730 ml  Output             1375 ml  Net             -645 ml   Filed Weights   07/01/16 0500 07/02/16 0500 07/03/16 0500  Weight: 109.5 kg (241 lb 6.5 oz) 111.3 kg (245 lb 6 oz) 109.2 kg (240 lb 11.9 oz)    Examination:  General exam: Appears to be resting this AM, NAD Respiratory system: diminished breath sounds at bases with scattered rhonchi   Cardiovascular system: IRRR. No rubs, gallops or clicks. Mild pedal edema.  Gastrointestinal system: Abdomen is nondistended, soft and nontender. No organomegaly or masses felt.  Central nervous system: Alert and oriented.   Data Reviewed: I have personally reviewed following labs and imaging studies  CBC:  Recent Labs Lab 06/28/16 0946 06/29/16 0353 06/30/16 0409 07/01/16 0353 07/02/16 0344 07/03/16 0316  WBC 2.8* 3.8* 7.6 9.0 6.7 5.1  NEUTROABS 1.9  --   --   --   --   --   HGB 12.6* 10.2* 9.5* 9.2* 8.8* 9.0*  HCT 36.3* 29.0* 27.8* 26.8* 25.6* 26.2*  MCV 91.2 89.2 93.0 92.4 92.8 94.2  PLT 75* 52* 43* 49* 55* 83*   Basic Metabolic Panel:  Recent Labs Lab 06/28/16 0946 06/28/16 1443 06/29/16 0353 06/30/16 0409 07/01/16 0353 07/02/16 0344 07/03/16 0316  NA 136  --  137 134* 137 137 138  K 4.2  --  3.8 3.8 3.6 3.7 3.5  CL 104  --  107 105 108 107 106  CO2 21*  --  _0 GLUCOSE 247*  --  123* 145* 120* 105* 107*  BUN 27*  --  _1 CREATININE 1.45*  --  1.01 1.14 1.15 0.95 0.99  CALCIUM 9.2  --  8.3* 8.2* 8.4* 8.7* 8.6*  MG 1.8 1.8  --   --   --   --   --   PHOS  --  2.5  --   --   --   --   --    Liver Function Tests:  Recent Labs Lab 06/28/16 0946 06/29/16 0353  AST 19 17  ALT 12* 12*  ALKPHOS 57 46  BILITOT 2.4* 1.0  PROT 7.1 5.6*  ALBUMIN 3.5 2.9*    Recent Labs Lab 06/28/16 0946  LIPASE 18   CBG:  Recent Labs Lab 07/02/16 1155 07/02/16 1543 07/02/16 2116 07/03/16 0733 07/03/16 1132  GLUCAP 160* 106* 134* 108* 149*   Urine analysis:    Component Value Date/Time   COLORURINE AMBER (A) 06/28/2016 0950   APPEARANCEUR CLOUDY (A) 06/28/2016 0950   LABSPEC 1.022 06/28/2016 0950   PHURINE 6.0 06/28/2016 0950   GLUCOSEU 500 (A) 06/28/2016 0950   HGBUR NEGATIVE 06/28/2016 0950   BILIRUBINUR NEGATIVE 06/28/2016 0950   KETONESUR NEGATIVE 06/28/2016 0950   PROTEINUR 30 (A) 06/28/2016 0950   UROBILINOGEN 1.0 08/25/2010  1351   NITRITE NEGATIVE 06/28/2016 0950   LEUKOCYTESUR NEGATIVE 06/28/2016 0950  Recent Results (from the past 240 hour(s))  MRSA PCR Screening     Status: Abnormal   Collection Time: 06/28/16  2:48 PM  Result Value Ref Range Status   MRSA by PCR POSITIVE (A) NEGATIVE Final    Comment:        The GeneXpert MRSA Assay (FDA approved for NASAL specimens only), is one component of a comprehensive MRSA colonization surveillance program. It is not intended to diagnose MRSA infection nor to guide or monitor treatment for MRSA infections. RESULT CALLED TO, READ BACK BY AND VERIFIED WITH: MAYS,S. RN _0  ON 9.28.17 BY MCCOY,N.   Culture, blood (x 2)     Status: None (Preliminary result)   Collection Time: 06/29/16  1:40 PM  Result Value Ref Range Status   Specimen Description BLOOD LEFT ARM  Final   Special Requests BOTTLES DRAWN AEROBIC AND ANAEROBIC  5CC  Final   Culture   Final    NO GROWTH 3 DAYS Performed at [facility]    Report Status PENDING  Incomplete  Culture, blood (x 2)     Status: None (Preliminary result)   Collection Time: 06/29/16  1:43 PM  Result Value Ref Range Status   Specimen Description BLOOD RIGHT ARM  Final   Special Requests BOTTLES DRAWN AEROBIC AND ANAEROBIC  5CC  Final   Culture   Final    NO GROWTH 3 DAYS Performed at [facility]    Report Status PENDING  Incomplete      Radiology Studies: No results found.    Scheduled Meds: . amiodarone  400 mg Oral Q8H  . aspirin EC  81 mg Oral Daily  . brinzolamide  2 drop Both Eyes BID   And  . brimonidine  2 drop Both Eyes BID  . digoxin  0.125 mg Oral Daily  . diltiazem  180 mg Oral BID  . dorzolamide  1 drop Left Eye BID  . folic acid  1 mg Oral Daily  . insulin aspart  0-15 Units Subcutaneous TID WC  . insulin aspart  0-5 Units Subcutaneous QHS  . latanoprost  1 drop Both Eyes QHS  . metoprolol tartrate  50 mg Oral BID  . mupirocin ointment  1 application Nasal BID  .  pantoprazole  40 mg Oral Daily  . piperacillin-tazobactam (ZOSYN)  IV  3.375 g Intravenous Q8H  . polyethylene glycol  17 g Oral Daily  . tamsulosin  0.4 mg Oral QHS  . vancomycin  1,000 mg Intravenous Q12H  . vitamin B-12  1,000 mcg Oral Daily   Continuous Infusions:     LOS: 5 days    Time spent: 20 minutes    Faye Ramsay, MD [facility] Pager [phone]  If 7PM-7AM, please contact night-coverage [email or web address] Password [email or web address] 07/03/2016, 1:34 PM

## 2016-07-04 LAB — CBC
HCT: 25.2 % — ABNORMAL LOW (ref 39.0–52.0)
Hemoglobin: 8.9 g/dL — ABNORMAL LOW (ref 13.0–17.0)
MCH: 31.7 pg (ref 26.0–34.0)
MCHC: 35.3 g/dL (ref 30.0–36.0)
MCV: 89.7 fL (ref 78.0–100.0)
PLATELETS: 83 10*3/uL — AB (ref 150–400)
RBC: 2.81 MIL/uL — ABNORMAL LOW (ref 4.22–5.81)
RDW: 14.1 % (ref 11.5–15.5)
WBC: 4.6 10*3/uL (ref 4.0–10.5)

## 2016-07-04 LAB — BASIC METABOLIC PANEL
ANION GAP: 6 (ref 5–15)
BUN: 8 mg/dL (ref 6–20)
CALCIUM: 8.5 mg/dL — AB (ref 8.9–10.3)
CO2: 26 mmol/L (ref 22–32)
Chloride: 105 mmol/L (ref 101–111)
Creatinine, Ser: 0.95 mg/dL (ref 0.61–1.24)
GLUCOSE: 99 mg/dL (ref 65–99)
Potassium: 3.3 mmol/L — ABNORMAL LOW (ref 3.5–5.1)
Sodium: 137 mmol/L (ref 135–145)

## 2016-07-04 LAB — GLUCOSE, CAPILLARY
GLUCOSE-CAPILLARY: 105 mg/dL — AB (ref 65–99)
Glucose-Capillary: 92 mg/dL (ref 65–99)
Glucose-Capillary: 125 mg/dL — ABNORMAL HIGH (ref 65–99)
Glucose-Capillary: 101 mg/dL — ABNORMAL HIGH (ref 65–99)

## 2016-07-04 LAB — CULTURE, BLOOD (ROUTINE X 2)
CULTURE: NO GROWTH
Culture: NO GROWTH

## 2016-07-04 MED ORDER — POTASSIUM CHLORIDE CRYS ER 20 MEQ PO TBCR
40.0000 meq | EXTENDED_RELEASE_TABLET | Freq: Once | ORAL | Status: AC
  Administered 2016-07-04: 40 meq via ORAL
  Filled 2016-07-04: qty 2

## 2016-07-04 NOTE — Progress Notes (Signed)
Patient ID: Matthew Berger, male   DOB: 1936/09/09, 80 y.o.   MRN: [id number]    PROGRESS NOTE    Matthew ROD  Berger:[id number] DOB: January 07, 1936 DOA: 06/28/2016  PCP: [name], MD   Brief Narrative:  80 y.o. male currently being treated with maintenance chemotherapy for stage IV lung cancer with metastases who presented to the emergency Department complaining of progressive weakness and dyspnea on exertion. The patient had chemotherapy last week and tolerated it well. He continues to have difficulty with ambulating mostly due to dyspnea but he currently denies chest pain and he also denies dyspnea at rest.   In the emergency department he was found to be clinically dehydrated with an acute renal insufficiency. He was also noted to be in atrial fibrillation with RVR. He was started on an IV diltiazem infusion to control his heart rate.   Assessment & Plan:  Sepsis of unclear etiology - please note that pt met sepsis criteria on admission, Tachycardia, fever, leukopenia in immunocompromised pt - source unclear but presumed to be PNA, difficult to tell as CT chest with ? Progression of underlying lung cancer  - placed on empiric ABX vanc and zosyn, no fevers overnight, WBC WNL, we have now transitioned to oral Levaquin, 10/01, stop on 10/05 which will complete 8 days of therapy  - possible d/c in AM if pt with no fevers, stable HR and stable WBC   Multifocal atrial tachycardia - per cardiology unlikely to be Atrial fibrillation with RVR. (CHA2DS2-VASc Score is 3 with yearly risk of stroke of 3.2%. - suspect this is related to underlying lung cancer with mets - pt transitioned to oral Cardizem 80 mg PO QD, metoprolol dose also increased from 25 mg PO BID to 50 mg PO BID  - no plan for anticoagulation due to thrombocytopenia  - HR has been relatively stable in the past 24 hours but still occasionally up to 120's  - per cardiology, Continue medical treatment with Beta-blocker, Calcium  channel blocker, amiodarone and lanoxin. - on 10/05, decrease amiodarone to 400 mg. bid and post 5 days decrease to 200 mg. bid x 1 week then one daily. - May discontinue lanoxin in one week. - cardiology will follow as needed.  Chronic diastolic CHF - with last ECHO 11/2015 with stable EF 60% and grade I diastolic CHF - carefull with IVF as weight is up from 228 --> 235 --> 241 --> 245 --> 240 lbs this AM - if BP low, would avoid further boluses and stop cardizem vs metoprolol   Acute kidney injury, hypokalemia - pre renal in etiology in the setting of sepsis - resolved with IVF - supplement K - BMP in AM  Acute hypoxic respiratory failure due to lung cancer, ? MAT vs a-fib  - pt with known stage IV (T3, N2, M1b) non-small cell lung cancer, adenocarcinoma  - CT chest with progressive lung cancer - pt follows with Dr. [name], appreciate his assistance, he will see pt in an outpatient setting   Essential hypertension - BP stable this AM 140/70  Pancytopenia   - from chemotherapy  - pt on Lovenox SQ for DVT prophylaxis which was changed to SCD's - Plt continue trending up this AM, Hg stable, no evidence of active bleeding - CBC in AM   DVT prophylaxis: SCD's Code Status: Full  Family Communication: Patient at bedside, family at bedside  Disposition Plan: was transferred to medical unit 10/02, plan d/c in AM  Consultants:   Cardiology  Procedures:   None  Antimicrobials:   Vancomycin 9/28 --> 10/01  Zosyn 9/28 --> 10/01  Levaquin 10/01 --> 10/05  Subjective: Reports he feels better.  Objective: Vitals:   07/04/16 0500 07/04/16 1008 07/04/16 1011 07/04/16 1335  BP: (!) 143/56 126/66  (!) 100/53  Pulse: 69 66 66 60  Resp: 18     Temp: 98.3 F (36.8 C)   98.2 F (36.8 C)  TempSrc: Oral   Oral  SpO2: 92%   93%  Weight: 109.8 kg (242 lb 1 oz)     Height:        Intake/Output Summary (Last 24 hours) at 07/04/16 1613 Last data filed at 07/04/16 1200   Gross per 24 hour  Intake              540 ml  Output              600 ml  Net              -60 ml   Filed Weights   07/02/16 0500 07/03/16 0500 07/04/16 0500  Weight: 111.3 kg (245 lb 6 oz) 109.2 kg (240 lb 11.9 oz) 109.8 kg (242 lb 1 oz)    Examination:  General exam: Appears to be resting this AM, NAD Respiratory system: diminished breath sounds at bases with scattered rhonchi  Cardiovascular system: IRRR. No rubs, gallops or clicks. Mild pedal edema.  Gastrointestinal system: Abdomen is nondistended, soft and nontender. No organomegaly or masses felt.  Central nervous system: Alert and oriented.   Data Reviewed: I have personally reviewed following labs and imaging studies  CBC:  Recent Labs Lab 06/28/16 0946  06/30/16 0409 07/01/16 0353 07/02/16 0344 07/03/16 0316 07/04/16 0534  WBC 2.8*  < > 7.6 9.0 6.7 5.1 4.6  NEUTROABS 1.9  --   --   --   --   --   --   HGB 12.6*  < > 9.5* 9.2* 8.8* 9.0* 8.9*  HCT 36.3*  < > 27.8* 26.8* 25.6* 26.2* 25.2*  MCV 91.2  < > 93.0 92.4 92.8 94.2 89.7  PLT 75*  < > 43* 49* 55* 83* 83*  < > = values in this interval not displayed. Basic Metabolic Panel:  Recent Labs Lab 06/28/16 0946 06/28/16 1443  06/30/16 0409 07/01/16 0353 07/02/16 0344 07/03/16 0316 07/04/16 0534  NA 136  --   < > 134* 137 137 138 137  K 4.2  --   < > 3.8 3.6 3.7 3.5 3.3*  CL 104  --   < > 105 108 107 106 105  CO2 21*  --   < > '22 23 24 23 26  '$ GLUCOSE 247*  --   < > 145* 120* 105* 107* 99  BUN 27*  --   < > '15 13 10 10 8  '$ CREATININE 1.45*  --   < > 1.14 1.15 0.95 0.99 0.95  CALCIUM 9.2  --   < > 8.2* 8.4* 8.7* 8.6* 8.5*  MG 1.8 1.8  --   --   --   --   --   --   PHOS  --  2.5  --   --   --   --   --   --   < > = values in this interval not displayed. Liver Function Tests:  Recent Labs Lab 06/28/16 0946 06/29/16 0353  AST 19 17  ALT 12* 12*  ALKPHOS 57 46  BILITOT 2.4* 1.0  PROT 7.1 5.6*  ALBUMIN 3.5 2.9*    Recent Labs Lab 06/28/16 0946    LIPASE 18   CBG:  Recent Labs Lab 07/03/16 1546 07/03/16 1804 07/03/16 2108 07/04/16 0759 07/04/16 1146  GLUCAP 150* 103* 154* 92 125*   Urine analysis:    Component Value Date/Time   COLORURINE AMBER (A) 06/28/2016 0950   APPEARANCEUR CLOUDY (A) 06/28/2016 0950   LABSPEC 1.022 06/28/2016 0950   PHURINE 6.0 06/28/2016 0950   GLUCOSEU 500 (A) 06/28/2016 0950   HGBUR NEGATIVE 06/28/2016 0950   BILIRUBINUR NEGATIVE 06/28/2016 0950   [place] NEGATIVE 06/28/2016 0950   PROTEINUR 30 (A) 06/28/2016 0950   UROBILINOGEN 1.0 08/25/2010 1351   NITRITE NEGATIVE 06/28/2016 0950   LEUKOCYTESUR NEGATIVE 06/28/2016 0950   Recent Results (from the past 240 hour(s))  MRSA PCR Screening     Status: Abnormal   Collection Time: 06/28/16  2:48 PM  Result Value Ref Range Status   MRSA by PCR POSITIVE (A) NEGATIVE Final    Comment:        The GeneXpert MRSA Assay (FDA approved for NASAL specimens only), is one component of a comprehensive MRSA colonization surveillance program. It is not intended to diagnose MRSA infection nor to guide or monitor treatment for MRSA infections. RESULT CALLED TO, READ BACK BY AND VERIFIED WITH: MAYS,S. RN '@[id number]'$  ON 9.28.17 BY MCCOY,N.   Culture, blood (x 2)     Status: None   Collection Time: 06/29/16  1:40 PM  Result Value Ref Range Status   Specimen Description BLOOD LEFT ARM  Final   Special Requests BOTTLES DRAWN AEROBIC AND ANAEROBIC  5CC  Final   Culture   Final    NO GROWTH 5 DAYS Performed at [facility]    Report Status 07/04/2016 FINAL  Final  Culture, blood (x 2)     Status: None   Collection Time: 06/29/16  1:43 PM  Result Value Ref Range Status   Specimen Description BLOOD RIGHT ARM  Final   Special Requests BOTTLES DRAWN AEROBIC AND ANAEROBIC  5CC  Final   Culture   Final    NO GROWTH 5 DAYS Performed at [facility]    Report Status 07/04/2016 FINAL  Final      Radiology Studies: No results  found.    Scheduled Meds: . amiodarone  400 mg Oral Q8H  . aspirin EC  81 mg Oral Daily  . brinzolamide  2 drop Both Eyes BID   And  . brimonidine  2 drop Both Eyes BID  . digoxin  0.125 mg Oral Daily  . diltiazem  180 mg Oral BID  . dorzolamide  1 drop Left Eye BID  . folic acid  1 mg Oral Daily  . insulin aspart  0-15 Units Subcutaneous TID WC  . insulin aspart  0-5 Units Subcutaneous QHS  . latanoprost  1 drop Both Eyes QHS  . levofloxacin  500 mg Oral q1800  . metoprolol tartrate  50 mg Oral BID  . pantoprazole  40 mg Oral Daily  . polyethylene glycol  17 g Oral Daily  . tamsulosin  0.4 mg Oral QHS  . vitamin B-12  1,000 mcg Oral Daily   Continuous Infusions:     LOS: 6 days    Time spent: 20 minutes    Faye Ramsay, MD [facility] Pager [phone]  If 7PM-7AM, please contact night-coverage [email or web address] Password [email or web address] 07/04/2016, 4:13 PM

## 2016-07-05 ENCOUNTER — Encounter (HOSPITAL_COMMUNITY): Payer: Self-pay

## 2016-07-05 DIAGNOSIS — I4891 Unspecified atrial fibrillation: Secondary | ICD-10-CM

## 2016-07-05 LAB — CBC
HEMATOCRIT: 25.8 % — AB (ref 39.0–52.0)
HEMOGLOBIN: 9 g/dL — AB (ref 13.0–17.0)
MCH: 32.3 pg (ref 26.0–34.0)
MCHC: 34.9 g/dL (ref 30.0–36.0)
MCV: 92.5 fL (ref 78.0–100.0)
Platelets: 102 10*3/uL — ABNORMAL LOW (ref 150–400)
RBC: 2.79 MIL/uL — AB (ref 4.22–5.81)
RDW: 14.2 % (ref 11.5–15.5)
WBC: 3.6 10*3/uL — AB (ref 4.0–10.5)

## 2016-07-05 LAB — GLUCOSE, CAPILLARY
GLUCOSE-CAPILLARY: 99 mg/dL (ref 65–99)
GLUCOSE-CAPILLARY: 101 mg/dL — AB (ref 65–99)
GLUCOSE-CAPILLARY: 135 mg/dL — AB (ref 65–99)
Glucose-Capillary: 128 mg/dL — ABNORMAL HIGH (ref 65–99)

## 2016-07-05 LAB — BASIC METABOLIC PANEL
Anion gap: 6 (ref 5–15)
BUN: 7 mg/dL (ref 6–20)
CALCIUM: 8.8 mg/dL — AB (ref 8.9–10.3)
CO2: 27 mmol/L (ref 22–32)
CREATININE: 1.06 mg/dL (ref 0.61–1.24)
Chloride: 105 mmol/L (ref 101–111)
GFR calc non Af Amer: >60 mL/min (ref >60)
Glucose, Bld: 102 mg/dL — ABNORMAL HIGH (ref 65–99)
Potassium: 3.6 mmol/L (ref 3.5–5.1)
SODIUM: 138 mmol/L (ref 135–145)

## 2016-07-05 MED ORDER — POTASSIUM CHLORIDE CRYS ER 20 MEQ PO TBCR
40.0000 meq | EXTENDED_RELEASE_TABLET | Freq: Once | ORAL | Status: AC
  Administered 2016-07-05: 40 meq via ORAL
  Filled 2016-07-05: qty 2

## 2016-07-05 MED ORDER — AMIODARONE HCL 200 MG PO TABS
400.0000 mg | ORAL_TABLET | Freq: Two times a day (BID) | ORAL | Status: DC
  Administered 2016-07-05 – 2016-07-06 (×2): 400 mg via ORAL
  Filled 2016-07-05 (×2): qty 2

## 2016-07-05 NOTE — Progress Notes (Signed)
SATURATION QUALIFICATIONS: (This note is used to comply with regulatory documentation for home oxygen)  Patient Saturations on Room Air at Rest = 97%  Patient Saturations on Room Air while Ambulating = 89%  Patient Saturations on 0 Liters of oxygen while Ambulating = 89%  Please briefly explain why patient needs home oxygen:

## 2016-07-05 NOTE — Care Management Important Message (Signed)
Important Message  Patient Details IM Letter given to Suzanne/Case Manager to present to Patient Name: Matthew Berger MRN: [id number] Date of Birth: May 16, 1936   Medicare Important Message Given:  Yes    Camillo Flaming 07/05/2016, 1:31 PM

## 2016-07-05 NOTE — Progress Notes (Addendum)
Physical Therapy Treatment Patient Details Name: Matthew Berger MRN: [id number] DOB: [birth year] Today's Date: 07/05/2016    History of Present Illness 80 y.o. male currently being treated with maintenance chemotherapy for stage IV lung cancer with metastases who presented to the emergency Department 06/28/16 complaining of progressive weakness and dyspnea on exertion.  Patient found to be dehydrated  and with ARI , in afib and RVR.    PT Comments    Pt progressing well with mobility, he ambulated 220' with RW, no loss of balance, SaO2 89-93% on RA, HR 70s, no dyspnea. He is safe to ambulate independently in halls.   Follow Up Recommendations  No PT follow up     Equipment Recommendations  None recommended by PT    Recommendations for Other Services OT consult     Precautions / Restrictions Precautions Precautions: Other (comment) Precaution Comments: monitor sats and HR Restrictions Weight Bearing Restrictions: No    Mobility  Bed Mobility               General bed mobility comments: NT- up on EOB  Transfers Overall transfer level: Independent Equipment used: None   Sit to Stand: Independent            Ambulation/Gait Ambulation/Gait assistance: Modified independent (Device/Increase time) Ambulation Distance (Feet): 220 Feet Assistive device: Rolling walker (2 wheeled) Gait Pattern/deviations: WFL(Within Functional Limits)     General Gait Details: SaO2 89-93% on RA walking, HR 70s, no LOB, no dyspnea   Stairs            Wheelchair Mobility    Modified Rankin (Stroke Patients Only)       Balance Overall balance assessment: Modified Independent                                  Cognition Arousal/Alertness: Awake/alert Behavior During Therapy: WFL for tasks assessed/performed Overall Cognitive Status: Within Functional Limits for tasks assessed                      Exercises      General Comments         Pertinent Vitals/Pain Pain Assessment: No/denies pain    Home Living                      Prior Function            PT Goals (current goals can now be found in the care plan section) Acute Rehab PT Goals Patient Stated Goal: to get outside PT Goal Formulation: With patient Time For Goal Achievement: 07/16/16 Potential to Achieve Goals: Good Progress towards PT goals: Progressing toward goals    Frequency    Min 3X/week      PT Plan Current plan remains appropriate    Co-evaluation             End of Session Equipment Utilized During Treatment: Gait belt Activity Tolerance: Patient tolerated treatment well Patient left: in chair;with call bell/phone within reach     Time: 1035-1057 PT Time Calculation (min) (ACUTE ONLY): 22 min  Charges:  $Gait Training: 8-22 mins                    G Codes:      Philomena Doheny 07/05/2016, 11:05 AM [phone]

## 2016-07-05 NOTE — Progress Notes (Signed)
Patient ID: Matthew Berger, male   DOB: 1936-05-07, 80 y.o.   MRN: [id number]    PROGRESS NOTE    Matthew Berger  AQT:[id number] DOB: 07/12/1936 DOA: 06/28/2016  PCP: [name], MD   Brief Narrative:  80 y.o. male currently being treated with maintenance chemotherapy for stage IV lung cancer with metastases who presented to the emergency Department complaining of progressive weakness and dyspnea on exertion. The patient had chemotherapy last week and tolerated it well. He continues to have difficulty with ambulating mostly due to dyspnea but he currently denies chest pain and he also denies dyspnea at rest.   In the emergency department he was found to be clinically dehydrated with an acute renal insufficiency. He was also noted to be in atrial fibrillation with RVR. He was started on an IV diltiazem infusion to control his heart rate.   Assessment & Plan:  Sepsis of unclear etiology - please note that pt met sepsis criteria on admission, Tachycardia, fever, leukopenia in immunocompromised pt - source unclear but presumed to be PNA, difficult to tell as CT chest with ? Progression of underlying lung cancer  - placed on empiric ABX vanc and zosyn, no fevers overnight, WBC WNL, we have now transitioned to oral Levaquin, 10/01, stop on 10/05 which will complete 8 days of therapy  - better   Multifocal atrial tachycardia - per cardiology unlikely to be Atrial fibrillation with RVR. (CHA2DS2-VASc Score is 3 with yearly risk of stroke of 3.2%. - suspect this is related to underlying lung cancer with mets - pt transitioned to oral Cardizem 80 mg PO QD, metoprolol dose also increased from 25 mg PO BID to 50 mg PO BID  - no plan for anticoagulation due to thrombocytopenia  - HR has been relatively stable in the past 24 hours but still occasionally up to 120's  - per cardiology, Continue medical treatment with Beta-blocker, Calcium channel blocker, amiodarone and lanoxin. - on 10/05, decrease  amiodarone to 400 mg. bid and post 5 days decrease to 200 mg. bid x 1 week then one daily. - May discontinue lanoxin in one week. - cardiology will follow as needed.  Chronic diastolic CHF - with last ECHO 11/2015 with stable EF 60% and grade I diastolic CHF - carefull with IVF as weight is up from 228 --> 235 --> 241 --> 245 --> 240 lbs this AM - if BP low, would avoid further boluses and stop cardizem vs metoprolol   Acute kidney injury, hypokalemia - pre renal in etiology in the setting of sepsis - resolved with IVF - supplement K - BMP in AM, check mag  Acute hypoxic respiratory failure due to lung cancer, ? MAT vs a-fib  - pt with known stage IV (T3, N2, M1b) non-small cell lung cancer, adenocarcinoma  - CT chest with progressive lung cancer - pt follows with Dr. [name], appreciate his assistance, he will see pt in an outpatient setting   Essential hypertension - BP stable this AM 140/70  Pancytopenia   - from chemotherapy  - pt on Lovenox SQ for DVT prophylaxis which was changed to SCD's - Plt continue trending up this AM, Hg stable, no evidence of active bleeding - seems improving   DVT prophylaxis: SCD's Code Status: Full  Family Communication: Patient at bedside, family at bedside  Disposition Plan: was transferred to medical unit 10/02, plan d/c on 10/5  Consultants:   Cardiology   Procedures:   None  Antimicrobials:   Vancomycin 9/28 -->  10/01  Zosyn 9/28 --> 10/01  Levaquin 10/01 --> 10/05  Subjective: Reports he feels better., sitting in chair, multiple family members in room  Objective: Vitals:   07/05/16 0500 07/05/16 0649 07/05/16 1022 07/05/16 1332  BP:  (!) 131/53 (!) 121/57 (!) 113/56  Pulse:  60 62 (!) 56  Resp:      Temp:  98.9 F (37.2 C)  98 F (36.7 C)  TempSrc:  Oral  Oral  SpO2:  97%  92%  Weight: 109 kg (240 lb 4.8 oz) 108.7 kg (239 lb 11.2 oz)    Height:        Intake/Output Summary (Last 24 hours) at 07/05/16 1825 Last  data filed at 07/05/16 1300  Gross per 24 hour  Intake             1080 ml  Output              900 ml  Net              180 ml   Filed Weights   07/04/16 0500 07/05/16 0500 07/05/16 0649  Weight: 109.8 kg (242 lb 1 oz) 109 kg (240 lb 4.8 oz) 108.7 kg (239 lb 11.2 oz)    Examination:  General exam: Appears to be resting this AM, NAD Respiratory system: diminished breath sounds at bases with scattered rhonchi  Cardiovascular system: IRRR. No rubs, gallops or clicks. Mild pedal edema.  Gastrointestinal system: Abdomen is nondistended, soft and nontender. No organomegaly or masses felt.  Central nervous system: Alert and oriented.   Data Reviewed: I have personally reviewed following labs and imaging studies  CBC:  Recent Labs Lab 07/01/16 0353 07/02/16 0344 07/03/16 0316 07/04/16 0534 07/05/16 0604  WBC 9.0 6.7 5.1 4.6 3.6*  HGB 9.2* 8.8* 9.0* 8.9* 9.0*  HCT 26.8* 25.6* 26.2* 25.2* 25.8*  MCV 92.4 92.8 94.2 89.7 92.5  PLT 49* 55* 83* 83* 842*   Basic Metabolic Panel:  Recent Labs Lab 07/01/16 0353 07/02/16 0344 07/03/16 0316 07/04/16 0534 07/05/16 0604  NA 137 137 138 137 138  K 3.6 3.7 3.5 3.3* 3.6  CL 108 107 106 105 105  CO2 _0 GLUCOSE 120* 105* 107* 99 102*  BUN _1 CREATININE 1.15 0.95 0.99 0.95 1.06  CALCIUM 8.4* 8.7* 8.6* 8.5* 8.8*   Liver Function Tests:  Recent Labs Lab 06/29/16 0353  AST 17  ALT 12*  ALKPHOS 46  BILITOT 1.0  PROT 5.6*  ALBUMIN 2.9*   No results for input(s): LIPASE, AMYLASE in the last 168 hours. CBG:  Recent Labs Lab 07/04/16 1702 07/04/16 2256 07/05/16 0755 07/05/16 1201 07/05/16 1701  GLUCAP 105* 101* 99 101* 135*   Urine analysis:    Component Value Date/Time   COLORURINE AMBER (A) 06/28/2016 0950   APPEARANCEUR CLOUDY (A) 06/28/2016 0950   LABSPEC 1.022 06/28/2016 0950   PHURINE 6.0 06/28/2016 0950   GLUCOSEU 500 (A) 06/28/2016 0950   HGBUR NEGATIVE 06/28/2016 0950   BILIRUBINUR  NEGATIVE 06/28/2016 0950   KETONESUR NEGATIVE 06/28/2016 0950   PROTEINUR 30 (A) 06/28/2016 0950   UROBILINOGEN 1.0 08/25/2010 1351   NITRITE NEGATIVE 06/28/2016 0950   LEUKOCYTESUR NEGATIVE 06/28/2016 0950   Recent Results (from the past 240 hour(s))  MRSA PCR Screening     Status: Abnormal   Collection Time: 06/28/16  2:48 PM  Result Value Ref Range Status   MRSA by PCR POSITIVE (A) NEGATIVE Final  Comment:        The GeneXpert MRSA Assay (FDA approved for NASAL specimens only), is one component of a comprehensive MRSA colonization surveillance program. It is not intended to diagnose MRSA infection nor to guide or monitor treatment for MRSA infections. RESULT CALLED TO, READ BACK BY AND VERIFIED WITH: MAYS,S. RN _0  ON 9.28.17 BY MCCOY,N.   Culture, blood (x 2)     Status: None   Collection Time: 06/29/16  1:40 PM  Result Value Ref Range Status   Specimen Description BLOOD LEFT ARM  Final   Special Requests BOTTLES DRAWN AEROBIC AND ANAEROBIC  5CC  Final   Culture   Final    NO GROWTH 5 DAYS Performed at [facility]    Report Status 07/04/2016 FINAL  Final  Culture, blood (x 2)     Status: None   Collection Time: 06/29/16  1:43 PM  Result Value Ref Range Status   Specimen Description BLOOD RIGHT ARM  Final   Special Requests BOTTLES DRAWN AEROBIC AND ANAEROBIC  5CC  Final   Culture   Final    NO GROWTH 5 DAYS Performed at [facility]    Report Status 07/04/2016 FINAL  Final      Radiology Studies: No results found.    Scheduled Meds: . amiodarone  400 mg Oral BID  . aspirin EC  81 mg Oral Daily  . brinzolamide  2 drop Both Eyes BID   And  . brimonidine  2 drop Both Eyes BID  . digoxin  0.125 mg Oral Daily  . diltiazem  180 mg Oral BID  . dorzolamide  1 drop Left Eye BID  . folic acid  1 mg Oral Daily  . insulin aspart  0-15 Units Subcutaneous TID WC  . insulin aspart  0-5 Units Subcutaneous QHS  . latanoprost  1 drop Both Eyes QHS   . levofloxacin  500 mg Oral q1800  . metoprolol tartrate  50 mg Oral BID  . pantoprazole  40 mg Oral Daily  . polyethylene glycol  17 g Oral Daily  . tamsulosin  0.4 mg Oral QHS  . vitamin B-12  1,000 mcg Oral Daily   Continuous Infusions:     LOS: 7 days    Time spent: 20 minutes    Torin Modica, MD PhD [facility] Pager [phone]  If 7PM-7AM, please contact night-coverage [email or web address] Password [email or web address] 07/05/2016, 6:25 PM

## 2016-07-05 NOTE — Progress Notes (Signed)
Discharge planning, spoke with patient at beside. Chose [facility] for [facility] services, contacted [facility] for referral. Lives at home with spouse, she will assist with meals and transportation. Awaiting saturation results to see if O2 will be needed at home.  [phone]

## 2016-07-06 LAB — COMPREHENSIVE METABOLIC PANEL
ALK PHOS: 50 U/L (ref 38–126)
ALT: 18 U/L (ref 17–63)
ANION GAP: 4 — AB (ref 5–15)
AST: 23 U/L (ref 15–41)
Albumin: 2.6 g/dL — ABNORMAL LOW (ref 3.5–5.0)
BILIRUBIN TOTAL: 0.7 mg/dL (ref 0.3–1.2)
BUN: 7 mg/dL (ref 6–20)
CALCIUM: 8.9 mg/dL (ref 8.9–10.3)
CO2: 28 mmol/L (ref 22–32)
CREATININE: 1.05 mg/dL (ref 0.61–1.24)
Chloride: 106 mmol/L (ref 101–111)
GFR calc non Af Amer: >60 mL/min (ref >60)
GLUCOSE: 103 mg/dL — AB (ref 65–99)
Potassium: 4.1 mmol/L (ref 3.5–5.1)
Sodium: 138 mmol/L (ref 135–145)
TOTAL PROTEIN: 5.8 g/dL — AB (ref 6.5–8.1)

## 2016-07-06 LAB — MAGNESIUM: Magnesium: 1.6 mg/dL — ABNORMAL LOW (ref 1.7–2.4)

## 2016-07-06 LAB — GLUCOSE, CAPILLARY
GLUCOSE-CAPILLARY: 95 mg/dL (ref 65–99)
GLUCOSE-CAPILLARY: 124 mg/dL — AB (ref 65–99)

## 2016-07-06 LAB — CBC
HEMATOCRIT: 27.9 % — AB (ref 39.0–52.0)
HEMOGLOBIN: 9.6 g/dL — AB (ref 13.0–17.0)
MCH: 32.1 pg (ref 26.0–34.0)
MCHC: 34.4 g/dL (ref 30.0–36.0)
MCV: 93.3 fL (ref 78.0–100.0)
Platelets: 133 10*3/uL — ABNORMAL LOW (ref 150–400)
RBC: 2.99 MIL/uL — ABNORMAL LOW (ref 4.22–5.81)
RDW: 14.7 % (ref 11.5–15.5)
WBC: 4.1 10*3/uL (ref 4.0–10.5)

## 2016-07-06 MED ORDER — DILTIAZEM HCL ER COATED BEADS 180 MG PO CP24: 180.0000 mg | ORAL_CAPSULE | Freq: Two times a day (BID) | ORAL | 0 refills | Status: DC

## 2016-07-06 MED ORDER — AMIODARONE HCL 400 MG PO TABS: 400.0000 mg | ORAL_TABLET | Freq: Two times a day (BID) | ORAL | 0 refills | Status: DC

## 2016-07-06 MED ORDER — DIGOXIN 125 MCG PO TABS: 0.1250 mg | ORAL_TABLET | Freq: Every day | ORAL | 0 refills | Status: DC

## 2016-07-06 MED ORDER — AMIODARONE HCL 200 MG PO TABS: 200.0000 mg | ORAL_TABLET | Freq: Every day | ORAL | 0 refills | Status: DC

## 2016-07-06 MED ORDER — MAGNESIUM SULFATE 2 GM/50ML IV SOLN
2.0000 g | Freq: Once | INTRAVENOUS | Status: AC
Start: 2016-07-06 — End: 2016-07-06
  Administered 2016-07-06: 2 g via INTRAVENOUS
  Filled 2016-07-06: qty 50

## 2016-07-06 MED ORDER — METOPROLOL TARTRATE 50 MG PO TABS: 50.0000 mg | ORAL_TABLET | Freq: Two times a day (BID) | ORAL | 0 refills | Status: AC

## 2016-07-06 MED ORDER — AMIODARONE HCL 200 MG PO TABS: 200.0000 mg | ORAL_TABLET | Freq: Two times a day (BID) | ORAL | 0 refills | Status: DC

## 2016-07-06 MED ORDER — LEVOFLOXACIN 500 MG PO TABS: 500.0000 mg | ORAL_TABLET | Freq: Every day | ORAL | 0 refills | Status: AC

## 2016-07-06 NOTE — Progress Notes (Signed)
Patient given discharge instructions, and verbalized an understanding of all discharge instructions.  Patient agrees with discharge plan, and is being discharged in stable medical condition.  Patient given transportation via wheelchair. 

## 2016-07-06 NOTE — Discharge Summary (Signed)
Discharge Summary  Matthew Berger JSE:[id number] DOB: 08-Oct-1935  PCP: [name], MD  Admit date: 06/28/2016 Discharge date: 07/06/2016  Time spent: >105mns  Recommendations for Outpatient Follow-up:  1. F/u with PMD within a week  for hospital discharge follow up, repeat cbc/bmp at follow up 2. F/u with cardiology Dr gEinar Gipfor atrial tachycardia 3. F/u with oncology Dr MJulien Nordmannfor lung cancer  Discharge Diagnoses:  Active Hospital Problems   Diagnosis Date Noted  . Atrial fibrillation with rapid ventricular response ([facility] 06/28/2016  . SIRS (systemic inflammatory response syndrome) ([facility] 06/28/2016  . Dehydration 06/28/2016  . Hyperglycemia 06/28/2016  . Atrial fibrillation with RVR ([facility] 12/02/2015  . GERD (gastroesophageal reflux disease) 12/02/2015  . Non-small cell lung cancer (NSCLC) ([facility] 11/19/2015  . Pulmonary nodules/lesions, multiple   . HTN (hypertension) 11/17/2015  . Dyslipidemia 11/17/2015  . BPH (benign prostatic hyperplasia) 11/17/2015    Resolved Hospital Problems   Diagnosis Date Noted Date Resolved  No resolved problems to display.    Discharge Condition: stable  Diet recommendation: heart healthy  Filed Weights   07/05/16 0500 07/05/16 0649 07/06/16 0500  Weight: 109 kg (240 lb 4.8 oz) 108.7 kg (239 lb 11.2 oz) 109.9 kg (242 lb 3.2 oz)    History of present illness:  Chief Complaint: dizziness  HPI: Matthew DOLLARis a 80y.o. male currently being treated with maintenance chemotherapy for stage IV lung cancer with metastases who presented to the emergency Department complaining of progressive weakness and dyspnea on exertion. The patient had chemotherapy last week and tolerated it well. He continues to have difficulty with ambulating and occasionally reports shortness of breath. He denies chest pain. He also has had poor oral intake over the last several days according to family members. He denies fever and chills. He denies nausea vomiting and  diarrhea. He was noted to have an elevated lactic acid but no apparent source of infection could be found.  In the emergency department he was found to be clinically dehydrated with an acute renal insufficiency. He was also noted to be in atrial fibrillation with RVR. He was started on an IV diltiazem infusion to control his heart rate. He is being admitted to the stepdown unit for further evaluation and management.  Hospital Course:  Principal Problem:   Atrial fibrillation with rapid ventricular response (HCC) Active Problems:   HTN (hypertension)   Dyslipidemia   BPH (benign prostatic hyperplasia)   Pulmonary nodules/lesions, multiple   GERD (gastroesophageal reflux disease)   Non-small cell lung cancer (NSCLC) (HCC)   Atrial fibrillation with RVR (HCC)   SIRS (systemic inflammatory response syndrome) (HCC)   Dehydration   Hyperglycemia   Sepsis of unclear etiology, his presenting symptom is dizziness which has resolved. - please note that pt met sepsis criteria on admission, Tachycardia, fever, leukopenia in immunocompromised pt - source unclear but presumed to be PNA, difficult to tell as CT chest with ? Progression of underlying lung cancer  - placed on empiric ABX vanc and zosyn, no fevers while in the hospital, WBC WNL, he is transitioned to oral Levaquin, to finish treatment  Multifocal atrial tachycardia - per cardiology unlikely to be Atrial fibrillation with RVR. (CHA2DS2-VASc Score is 3 with yearly risk of stroke of 3.2%. - suspect this is related to underlying lung cancer with mets - patient initially required cardizem drip, pt transitioned to oral Cardizem 80 mg PO BID, metoprolol dose also increased from 25 mg PO BID to 50 mg PO BID  -  no plan for anticoagulation due to thrombocytopenia per cardiology -  per cardiology, Continue medical treatment with Beta-blocker, Calcium channel blocker, taper amiodarone to '200mg'$  qd and discontinue lanoxin after finish total of one  week treatment.  Chronic diastolic CHF - with last ECHO 11/2015 with stable EF 60% and grade I diastolic CHF - euvolemic at discharge  Acute kidney injury, hypokalemia, hypomagnesemia  - pre renal in etiology in the setting of sepsis - cr normalized  with IVF - k/mag replaced  Acute hypoxic respiratory failure due to lung cancer, ? MAT vs a-fib  - pt with known stage IV (T3, N2, M1b) non-small cell lung cancer, adenocarcinoma  - CT chest with progressive lung cancer - pt follows with Dr. [name], appreciate his assistance, he will see pt in an outpatient setting   Essential hypertension - BP stable   thrombocytpenia - from chemotherapy +/-sepsis - pt on Lovenox SQ for DVT prophylaxis which was changed to SCD's - Plt nadir at 43 on 9/29, at time of discharge, plt 133.  Anemia: hgb stable around 9   DVT prophylaxis while in the hospital: SCD's Code Status: Full  Family Communication: Patient at bedside, family at bedside  Disposition Plan:  d/c home with home health on 10/5  Consultants:   Cardiology   Procedures:   None  Antimicrobials:   Vancomycin 9/28 --> 10/01  Zosyn 9/28 --> 10/01  Levaquin 10/01 --> 10/05   Discharge Exam: BP 133/87 (BP Location: Left Arm)   Pulse 63   Temp 98.6 F (37 C) (Oral)   Resp 16   Ht '6\' 2"'$  (1.88 m)   Wt 109.9 kg (242 lb 3.2 oz)   SpO2 90%   BMI 31.10 kg/m    General exam: Appears to be resting this AM, NAD Respiratory system: diminished breath sounds at bases with scattered rhonchi  Cardiovascular system: IRRR. No rubs, gallops or clicks. Mild pedal edema.  Gastrointestinal system: Abdomen is nondistended, soft and nontender. No organomegaly or masses felt.  Central nervous system: Alert and oriented.   Discharge Instructions You were cared for by a hospitalist during your hospital stay. If you have any questions about your discharge medications or the care you received while you were in the hospital after you  are discharged, you can call the unit and asked to speak with the hospitalist on call if the hospitalist that took care of you is not available. Once you are discharged, your primary care physician will handle any further medical issues. Please note that NO REFILLS for any discharge medications will be authorized once you are discharged, as it is imperative that you return to your primary care physician (or establish a relationship with a primary care physician if you do not have one) for your aftercare needs so that they can reassess your need for medications and monitor your lab values.  Discharge Instructions    Diet - low sodium heart healthy    Complete by:  As directed    Increase activity slowly    Complete by:  As directed        Medication List    TAKE these medications   amiodarone 400 MG tablet Commonly known as:  PACERONE Take 1 tablet (400 mg total) by mouth 2 (two) times daily.   amiodarone 200 MG tablet Commonly known as:  PACERONE Take 1 tablet (200 mg total) by mouth 2 (two) times daily. Start taking on:  07/09/2016   amiodarone 200 MG tablet Commonly known as:  PACERONE Take 1 tablet (200 mg total) by mouth daily. Please call cardiology Dr Einar Gip for refills. Start taking on:  07/17/2016   aspirin 81 MG tablet Take 81 mg by mouth daily.   atorvastatin 20 MG tablet Commonly known as:  LIPITOR Take 20 mg by mouth daily.   dexamethasone 4 MG tablet Commonly known as:  DECADRON TAKE 1 TABLET BY MOUTH TWICE A DAY WITH A MEAL the day before,of and the day after chemo   digoxin 0.125 MG tablet Commonly known as:  LANOXIN Take 1 tablet (0.125 mg total) by mouth daily.   diltiazem 180 MG 24 hr capsule Commonly known as:  CARDIZEM CD Take 1 capsule (180 mg total) by mouth 2 (two) times daily. What changed:  See the new instructions.   dorzolamide 2 % ophthalmic solution Commonly known as:  TRUSOPT Place 1 drop into the left eye 2 (two) times daily.   FLOMAX  0.4 MG Caps capsule Generic drug:  tamsulosin Take 0.4 mg by mouth daily.   folic acid 1 MG tablet Commonly known as:  FOLVITE TAKE 1 TABLET (1 MG TOTAL) BY MOUTH DAILY.   latanoprost 0.005 % ophthalmic solution Commonly known as:  XALATAN Place 1 drop into both eyes at bedtime.   levofloxacin 500 MG tablet Commonly known as:  LEVAQUIN Take 1 tablet (500 mg total) by mouth daily at 6 PM.   metoprolol 50 MG tablet Commonly known as:  LOPRESSOR Take 1 tablet (50 mg total) by mouth 2 (two) times daily. What changed:  medication strength  how much to take   prochlorperazine 10 MG tablet Commonly known as:  COMPAZINE TAKE 1 TABLET BY MOUTH EVERY 6 HOURS AS NEEDED FOR NAUSEA AND VOMITING   SIMBRINZA 1-0.2 % Susp Generic drug:  Brinzolamide-Brimonidine Apply 2 drops to eye 2 (two) times daily.   vitamin B-12 1000 MCG tablet Commonly known as:  CYANOCOBALAMIN Take 1,000 mcg by mouth daily.      No Known Allergies Follow-up Information    [facility] .   Why:  nurse, physical therapy, occupational therapy, aide Contact information: [place] [address] [phone]        Adrian Prows, MD Follow up in 3 week(s).   Specialty:  Cardiology Why:  atrial tachycardia Contact information: [place] [address] [place], MD Follow up in 1 week(s).   Specialty:  Internal Medicine Why:  hospital discharge follow up, repeat cbc/bmp Contact information: [address]  [address] [phone]        Eilleen Kempf., MD Follow up on 07/06/2016.   Specialty:  Oncology Contact information: [address] [place] [address] [phone]            The results of significant diagnostics from this hospitalization (including imaging, microbiology, ancillary and laboratory) are listed below for reference.    Significant Diagnostic Studies: Ct Chest Wo  Contrast  Result Date: 06/29/2016 CLINICAL DATA:  Shortness of breath. Non-small cell lung cancer of left lower lobe. EXAM: CT CHEST WITHOUT CONTRAST TECHNIQUE: Multidetector CT imaging of the chest was performed following the standard protocol without IV contrast. COMPARISON:  CT of the chest 05/22/2016 FINDINGS: Cardiovascular: Calcific atherosclerotic disease of the coronary arteries and aorta. Small pericardial effusion at the heart base. Mediastinum/Nodes: Numerous mostly less than a cm mediastinal lymph nodes. More prominent lymph nodes are seen in precarinal station, the largest  measuring 12 mm in short axis, and subcarinal station measuring up to 2 cm in short axis. Frothy material within the esophagus. Lungs/Pleura: There is overall worsening of the aeration of the lungs. There is more confluent consolidation of the left lower lobe. The previously seen individual pulmonary masses in the basilar segments of the left lobe have coalesced. A mass in the superior segment of the left lower lobe 1.9 by 1.8 cm, previously 1.8 x 1.1 cm. There are more confluent ground-glass opacities in the lingula. The index mass in the right upper lobe is 1.6 cm, stable. There is a new ground-glass opacity in the right middle lobe, as well as individual ground-glass nodules in the medial segment of the right middle lobe. Upper Abdomen: No acute abnormality. Musculoskeletal: Sclerotic lesion in T9 vertebral body is stable without evidence of pathologic fracture. IMPRESSION: Worsening of innumerable bilateral pulmonary masses. The previous seen individual solid and ground-glass pulmonary masses in the left lower lobe have coalesced. Enlargement of pre-existing pulmonary masses an interval development of ground-glass sub solid masses in the right middle lobe. Stable predominantly sclerotic in appearance metastatic lesion in T9 vertebral body with evidence pathologic fracture. Small pericardial junction. Calcific atherosclerotic  disease of the aorta and coronary arteries. Electronically Signed   By: Ted Mcalpine M.D.   On: 06/29/2016 16:11   Dg Chest Port 1 View  Result Date: 06/29/2016 CLINICAL DATA:  Sirs EXAM: PORTABLE CHEST 1 VIEW COMPARISON:  Yesterday FINDINGS: Chronic left lower lobe airspace disease correlating with adenocarcinoma. Multiple sub solid pulmonary nodules by CT are not visible radiographically. No superimposed consolidation, edema, effusion, or pneumothorax. IMPRESSION: Chronic airspace disease from adenocarcinoma. No detectable pneumonia. Electronically Signed   By: Marnee Spring M.D.   On: 06/29/2016 07:17   Dg Chest Port 1 View  Result Date: 06/28/2016 CLINICAL DATA:  Increased weakness and shortness of breath. History of lung cancer. EXAM: PORTABLE CHEST 1 VIEW COMPARISON:  Chest CT 05/22/2016 FINDINGS: Hazy left perihilar opacity correlates with adenocarcinoma seen on previous CT. Multiple sub solid pulmonary nodules are not visible radiographically. No evidence of superimposed pneumonia. No effusion or pneumothorax. Normal heart size mediastinal contours. IMPRESSION: Multi focal pulmonary adenocarcinoma. No evidence of acute superimposed disease. Electronically Signed   By: Marnee Spring M.D.   On: 06/28/2016 10:28    Microbiology: Recent Results (from the past 240 hour(s))  MRSA PCR Screening     Status: Abnormal   Collection Time: 06/28/16  2:48 PM  Result Value Ref Range Status   MRSA by PCR POSITIVE (A) NEGATIVE Final    Comment:        The GeneXpert MRSA Assay (FDA approved for NASAL specimens only), is one component of a comprehensive MRSA colonization surveillance program. It is not intended to diagnose MRSA infection nor to guide or monitor treatment for MRSA infections. RESULT CALLED TO, READ BACK BY AND VERIFIED WITH: MAYS,S. RN @0733  ON 9.28.17 BY MCCOY,N.   Culture, blood (x 2)     Status: None   Collection Time: 06/29/16  1:40 PM  Result Value Ref Range  Status   Specimen Description BLOOD LEFT ARM  Final   Special Requests BOTTLES DRAWN AEROBIC AND ANAEROBIC  5CC  Final   Culture   Final    NO GROWTH 5 DAYS Performed at [facility]    Report Status 07/04/2016 FINAL  Final  Culture, blood (x 2)     Status: None   Collection Time: 06/29/16  1:43 PM  Result Value Ref Range Status   Specimen Description BLOOD RIGHT ARM  Final   Special Requests BOTTLES DRAWN AEROBIC AND ANAEROBIC  5CC  Final   Culture   Final    NO GROWTH 5 DAYS Performed at [facility]    Report Status 07/04/2016 FINAL  Final     Labs: Basic Metabolic Panel:  Recent Labs Lab 07/02/16 0344 07/03/16 0316 07/04/16 0534 07/05/16 0604 07/06/16 0527  NA 137 138 137 138 138  K 3.7 3.5 3.3* 3.6 4.1  CL 107 106 105 105 106  CO2 '24 23 26 27 28  '$ GLUCOSE 105* 107* 99 102* 103*  BUN '10 10 8 7 7  '$ CREATININE 0.95 0.99 0.95 1.06 1.05  CALCIUM 8.7* 8.6* 8.5* 8.8* 8.9  MG  --   --   --   --  1.6*   Liver Function Tests:  Recent Labs Lab 07/06/16 0527  AST 23  ALT 18  ALKPHOS 50  BILITOT 0.7  PROT 5.8*  ALBUMIN 2.6*   No results for input(s): LIPASE, AMYLASE in the last 168 hours. No results for input(s): AMMONIA in the last 168 hours. CBC:  Recent Labs Lab 07/02/16 0344 07/03/16 0316 07/04/16 0534 07/05/16 0604 07/06/16 0527  WBC 6.7 5.1 4.6 3.6* 4.1  HGB 8.8* 9.0* 8.9* 9.0* 9.6*  HCT 25.6* 26.2* 25.2* 25.8* 27.9*  MCV 92.8 94.2 89.7 92.5 93.3  PLT 55* 83* 83* 102* 133*   Cardiac Enzymes: No results for input(s): CKTOTAL, CKMB, CKMBINDEX, TROPONINI in the last 168 hours. BNP: BNP (last 3 results)  Recent Labs  06/28/16 0946  BNP 123.7*    ProBNP (last 3 results) No results for input(s): PROBNP in the last 8760 hours.  CBG:  Recent Labs Lab 07/05/16 0755 07/05/16 1201 07/05/16 1701 07/05/16 2154 07/06/16 0737  GLUCAP 99 101* 135* 128* 95       Signed:  Itzell Bendavid MD, PhD  [facility] 07/06/2016, 9:59  AM

## 2016-07-12 ENCOUNTER — Ambulatory Visit: Payer: Medicare Other

## 2016-07-12 ENCOUNTER — Other Ambulatory Visit (HOSPITAL_BASED_OUTPATIENT_CLINIC_OR_DEPARTMENT_OTHER): Payer: Medicare Other

## 2016-07-12 ENCOUNTER — Telehealth: Payer: Self-pay | Admitting: Internal Medicine

## 2016-07-12 ENCOUNTER — Ambulatory Visit (HOSPITAL_BASED_OUTPATIENT_CLINIC_OR_DEPARTMENT_OTHER): Payer: Medicare Other | Admitting: Internal Medicine

## 2016-07-12 ENCOUNTER — Encounter: Payer: Self-pay | Admitting: Internal Medicine

## 2016-07-12 VITALS — BP 136/66 | HR 71 | Temp 98.4°F | Resp 17 | Ht 74.0 in | Wt 231.2 lb

## 2016-07-12 DIAGNOSIS — C3432 Malignant neoplasm of lower lobe, left bronchus or lung: Secondary | ICD-10-CM

## 2016-07-12 DIAGNOSIS — C3491 Malignant neoplasm of unspecified part of right bronchus or lung: Secondary | ICD-10-CM

## 2016-07-12 DIAGNOSIS — R5383 Other fatigue: Secondary | ICD-10-CM | POA: Diagnosis not present

## 2016-07-12 DIAGNOSIS — C3490 Malignant neoplasm of unspecified part of unspecified bronchus or lung: Secondary | ICD-10-CM

## 2016-07-12 DIAGNOSIS — Z5112 Encounter for antineoplastic immunotherapy: Secondary | ICD-10-CM

## 2016-07-12 HISTORY — DX: Encounter for antineoplastic immunotherapy: Z51.12

## 2016-07-12 LAB — CBC WITH DIFFERENTIAL/PLATELET
BASO%: 0 % (ref 0.0–2.0)
BASOS ABS: 0 10*3/uL (ref 0.0–0.1)
EOS%: 0 % (ref 0.0–7.0)
Eosinophils Absolute: 0 10*3/uL (ref 0.0–0.5)
HEMATOCRIT: 33.1 % — AB (ref 38.4–49.9)
HEMOGLOBIN: 11.3 g/dL — AB (ref 13.0–17.1)
LYMPH#: 0.5 10*3/uL — AB (ref 0.9–3.3)
LYMPH%: 7.2 % — ABNORMAL LOW (ref 14.0–49.0)
MCH: 31.4 pg (ref 27.2–33.4)
MCHC: 34.1 g/dL (ref 32.0–36.0)
MCV: 91.9 fL (ref 79.3–98.0)
MONO#: 0.1 10*3/uL (ref 0.1–0.9)
MONO%: 1.3 % (ref 0.0–14.0)
NEUT#: 6.1 10*3/uL (ref 1.5–6.5)
NEUT%: 91.5 % — ABNORMAL HIGH (ref 39.0–75.0)
Platelets: 274 10*3/uL (ref 140–400)
RBC: 3.6 10*6/uL — ABNORMAL LOW (ref 4.20–5.82)
RDW: 14.7 % — AB (ref 11.0–14.6)
WBC: 6.7 10*3/uL (ref 4.0–10.3)

## 2016-07-12 LAB — COMPREHENSIVE METABOLIC PANEL
ALBUMIN: 2.9 g/dL — AB (ref 3.5–5.0)
ALK PHOS: 73 U/L (ref 40–150)
ALT: 15 U/L (ref 0–55)
AST: 16 U/L (ref 5–34)
Anion Gap: 13 mEq/L — ABNORMAL HIGH (ref 3–11)
BILIRUBIN TOTAL: 0.31 mg/dL (ref 0.20–1.20)
BUN: 14.2 mg/dL (ref 7.0–26.0)
CALCIUM: 9.3 mg/dL (ref 8.4–10.4)
CO2: 19 mEq/L — ABNORMAL LOW (ref 22–29)
CREATININE: 1.3 mg/dL (ref 0.7–1.3)
Chloride: 106 mEq/L (ref 98–109)
EGFR: 62 mL/min/{1.73_m2} — ABNORMAL LOW (ref >90)
Glucose: 207 mg/dl — ABNORMAL HIGH (ref 70–140)
Potassium: 4.1 mEq/L (ref 3.5–5.1)
Sodium: 138 mEq/L (ref 136–145)
TOTAL PROTEIN: 6.5 g/dL (ref 6.4–8.3)

## 2016-07-12 NOTE — Progress Notes (Signed)
DISCONTINUE ON PATHWAY REGIMEN - Non-Small Cell Lung  [id number]: Pemetrexed 500 mg/m2 q21 Days Until Progression or Unacceptable Toxicity   A cycle is every 21 days:     Pemetrexed (Alimta(R)) 500 mg/m2 in 100 mL NS IV over 10 minutes, manufacturer recommends not administering to patients with CrCl < 45 mL/min Dose Mod: None Additional Orders: Note: Patient to receive the following prior to the initiation of therapy: 1) Dexamethasone 4 mg orally twice daily x 6 doses.  First dose 24 hours before chemotherapy. 2) Folic acid >= 791 mcg orally daily.  First dose at least 5 days prior to the first dose of pemetrexed. 3) Vitamin B12 1,000 mcg intramuscularly every 9 weeks.  First dose at least 5 days prior to the first dose of pemetrexed.  **Always confirm dose/schedule in your pharmacy ordering system**    REASON: Disease Progression PRIOR TREATMENT: [id number]: Pemetrexed 500 mg/m2 q21 Days Until Progression or Unacceptable Toxicity TREATMENT RESPONSE: Progressive Disease (PD)  START ON PATHWAY REGIMEN - Non-Small Cell Lung  [id number]: Nivolumab 240 mg q14 Days Until Progression or Unacceptable Toxicity   A cycle is every 14 days:     Nivolumab (Opdivo(R)) 240 mg flat dose in 100 mL NS IV over 60 minutes. Inline filter required (low protein binding) Dose Mod: None Additional Orders: Severe immune-mediated reactions can occur (e.g. pneumonitis, colitis, and hepatitis). See prescribing information for more details including monitoring and required immediate management with steroids. Monitor thyroid, renal, liver  function tests, glucose, and sodium at baseline and periodically during therapy.  **Always confirm dose/schedule in your pharmacy ordering system**    Patient Characteristics: Stage IV Metastatic, Non Squamous, Second Line - Chemotherapy/Immunotherapy, PS = 0, 1, No Prior PD-1/PD-L1  Inhibitor and Immunotherapy Candidate AJCC M Stage: 1 AJCC N Stage: 2 AJCC T Stage: 3 Current Disease  Status: Distant Metastases AJCC Stage Grouping: IV Histology: Non Squamous Cell ROS1 Rearrangement Status: Negative T790M Mutation Status: Not Applicable - EGFR Mutation Negative/Unknown Other Mutations/Biomarkers: No Other Actionable Mutations PD-L1 Expression Status: Test Not Ordered Chemotherapy/Immunotherapy LOT: Second Line Chemotherapy/Immunotherapy Molecular Targeted Therapy: Not Appropriate ALK Translocation Status: Negative Would you be surprised if this patient died  in the next year? I would NOT be surprised if this patient died in the next year EGFR Mutation Status: Negative/Wild Type BRAF V600E Mutation Status: Negative Performance Status: PS = 0, 1 Immunotherapy Candidate Status: Candidate for Immunotherapy Prior Immunotherapy Status: No Prior PD-1/PD-L1 Inhibitor  Intent of Therapy: Non-Curative / Palliative Intent, Discussed with Patient

## 2016-07-12 NOTE — Telephone Encounter (Signed)
Gave patient avs report and appointments for October thru December

## 2016-07-12 NOTE — Progress Notes (Signed)
[place] [phone]   [phone]) [phone]  OFFICE PROGRESS NOTE  [name], MD [address] [place] [address]  DIAGNOSIS: Stage IV (T3, N2, M1b) non-small cell lung cancer, adenocarcinoma with negative EGFR mutation, negative ALK gene translocation, negative ROS 1, presented with large left lower lobe consolidative mass in addition to bilateral pulmonary nodules, right hilar and subcarinal lymphadenopathy as well as suspicious bony lesion diagnosed in February 2017.  PRIOR THERAPY:  1) Systemic chemotherapy with carboplatin for AUC of 5 and Alimta 500 MG/M2 every 3 weeks. First dose 12/29/2015. Status post 6 cycles. Last dose was given 04/19/2016 with stable disease. 2) Maintenance systemic chemotherapy with single agent Alimta 500 MG/M2 every 3 weeks. First dose 05/31/2016. Status post 2 cycles, last dose was given 06/21/2016 discontinued secondary to disease progression.  CURRENT THERAPY: Immunotherapy with Nivolumab 240 mg IV every 2 weeks. First dose 07/26/2016.  INTERVAL HISTORY: Matthew Berger 80 y.o. male returns to the clinic today for follow-up visit accompanied by several family members. The patient is feeling fine today with no specific complaints except for fatigue. He was recently admitted to [facility] with shortness of breath and CT scan of the chest showed evidence for disease progression in addition to pneumonia. He was treated for pneumonia and was discharged from the hospital last week. He completed 2 cycles of systemic chemotherapy with maintenance Alimta and tolerated his treatment well except for the recent admission was pneumonia. He continues to have fatigue. He denied having any significant fever or chills. He has no chest pain, shortness of breath except with exertion, cough or hemoptysis. He is here today for evaluation and discussion of his treatment options.  MEDICAL HISTORY: Past Medical History:  Diagnosis  Date  . Atrial fibrillation ([place])   . CHF (congestive heart failure) ([place])   . Encounter for antineoplastic chemotherapy 01/05/2016  . GERD (gastroesophageal reflux disease)   . Hypercholesteremia   . Hypertension   . Non-small cell lung cancer (NSCLC) ([place]) 11/19/15  . Pneumonia 11/18/2015    ALLERGIES:  has No Known Allergies.  MEDICATIONS:  Current Outpatient Prescriptions  Medication Sig Dispense Refill  . amiodarone (PACERONE) 200 MG tablet Take 1 tablet (200 mg total) by mouth 2 (two) times daily. 14 tablet 0  . [START ON 07/17/2016] amiodarone (PACERONE) 200 MG tablet Take 1 tablet (200 mg total) by mouth daily. Please call cardiology Dr Einar Gip for refills. 30 tablet 0  . amiodarone (PACERONE) 400 MG tablet Take 1 tablet (400 mg total) by mouth 2 (two) times daily. 4 tablet 0  . aspirin 81 MG tablet Take 81 mg by mouth daily.    Marland Kitchen atorvastatin (LIPITOR) 20 MG tablet Take 20 mg by mouth daily.    . Brinzolamide-Brimonidine (SIMBRINZA) 1-0.2 % SUSP Apply 2 drops to eye 2 (two) times daily.    Marland Kitchen dexamethasone (DECADRON) 4 MG tablet TAKE 1 TABLET BY MOUTH TWICE A DAY WITH A MEAL the day before,of and the day after chemo  1  . digoxin (LANOXIN) 0.125 MG tablet Take 1 tablet (0.125 mg total) by mouth daily. 2 tablet 0  . diltiazem (CARDIZEM CD) 180 MG 24 hr capsule Take 1 capsule (180 mg total) by mouth 2 (two) times daily. 60 capsule 0  . dorzolamide (TRUSOPT) 2 % ophthalmic solution Place 1 drop into the left eye 2 (two) times daily.    . folic acid (FOLVITE) 1 MG tablet TAKE 1 TABLET (1 MG  TOTAL) BY MOUTH DAILY.  0  . latanoprost (XALATAN) 0.005 % ophthalmic solution Place 1 drop into both eyes at bedtime.    . metoprolol (LOPRESSOR) 50 MG tablet Take 1 tablet (50 mg total) by mouth 2 (two) times daily. 60 tablet 0  . prochlorperazine (COMPAZINE) 10 MG tablet TAKE 1 TABLET BY MOUTH EVERY 6 HOURS AS NEEDED FOR NAUSEA AND VOMITING 30 tablet 0  . tamsulosin (FLOMAX) 0.4 MG CAPS capsule Take  0.4 mg by mouth daily.    . vitamin B-12 (CYANOCOBALAMIN) 1000 MCG tablet Take 1,000 mcg by mouth daily.     No current facility-administered medications for this visit.     SURGICAL HISTORY:  Past Surgical History:  Procedure Laterality Date  . CATARACT EXTRACTION Bilateral   . ROTATOR CUFF REPAIR Right   . VIDEO BRONCHOSCOPY Bilateral 11/19/2015   Procedure: VIDEO BRONCHOSCOPY WITH FLUORO;  Surgeon: Rigoberto Noel, MD;  Location: [place];  Service: Cardiopulmonary;  Laterality: Bilateral;  . WISDOM TOOTH EXTRACTION      REVIEW OF SYSTEMS:  Constitutional: positive for fatigue Eyes: negative Ears, nose, mouth, throat, and face: negative Respiratory: positive for cough and dyspnea on exertion Cardiovascular: negative Gastrointestinal: negative Genitourinary:negative Integument/breast: negative Hematologic/lymphatic: negative Musculoskeletal:negative Neurological: negative Behavioral/Psych: negative Endocrine: negative Allergic/Immunologic: negative   PHYSICAL EXAMINATION: General appearance: alert, cooperative and no distress Head: Normocephalic, without obvious abnormality, atraumatic Neck: no adenopathy, no JVD, supple, symmetrical, trachea midline and thyroid not enlarged, symmetric, no tenderness/mass/nodules Lymph nodes: Cervical, supraclavicular, and axillary nodes normal. Resp: clear to auscultation bilaterally Back: symmetric, no curvature. ROM normal. No CVA tenderness. Cardio: regular rate and rhythm, S1, S2 normal, no murmur, click, rub or gallop GI: soft, non-tender; bowel sounds normal; no masses,  no organomegaly Extremities: extremities normal, atraumatic, no cyanosis or edema Neurologic: Alert and oriented X 3, normal strength and tone. Normal symmetric reflexes. Normal coordination and gait  ECOG PERFORMANCE STATUS: 1 - Symptomatic but completely ambulatory  Blood pressure (!) 128/59, pulse 71, temperature 98.4 F (36.9 C), temperature source Oral,  resp. rate 17, height '6\' 2"'$  (1.88 m), weight 231 lb 3.2 oz (104.9 kg), SpO2 93 %.  LABORATORY DATA: Lab Results  Component Value Date   WBC 6.7 07/12/2016   HGB 11.3 (L) 07/12/2016   HCT 33.1 (L) 07/12/2016   MCV 91.9 07/12/2016   PLT 274 07/12/2016      Chemistry      Component Value Date/Time   NA 138 07/12/2016 0933   K 4.1 07/12/2016 0933   CL 106 07/06/2016 0527   CO2 19 (L) 07/12/2016 0933   BUN 14.2 07/12/2016 0933   CREATININE 1.3 07/12/2016 0933      Component Value Date/Time   CALCIUM 9.3 07/12/2016 0933   ALKPHOS 73 07/12/2016 0933   AST 16 07/12/2016 0933   ALT 15 07/12/2016 0933   BILITOT 0.31 07/12/2016 0933       RADIOGRAPHIC STUDIES: Ct Chest Wo Contrast  Result Date: 06/29/2016 CLINICAL DATA:  Shortness of breath. Non-small cell lung cancer of left lower lobe. EXAM: CT CHEST WITHOUT CONTRAST TECHNIQUE: Multidetector CT imaging of the chest was performed following the standard protocol without IV contrast. COMPARISON:  CT of the chest 05/22/2016 FINDINGS: Cardiovascular: Calcific atherosclerotic disease of the coronary arteries and aorta. Small pericardial effusion at the heart base. Mediastinum/Nodes: Numerous mostly less than a cm mediastinal lymph nodes. More prominent lymph nodes are seen in precarinal station, the largest measuring 12 mm in short axis, and subcarinal  station measuring up to 2 cm in short axis. Frothy material within the esophagus. Lungs/Pleura: There is overall worsening of the aeration of the lungs. There is more confluent consolidation of the left lower lobe. The previously seen individual pulmonary masses in the basilar segments of the left lobe have coalesced. A mass in the superior segment of the left lower lobe 1.9 by 1.8 cm, previously 1.8 x 1.1 cm. There are more confluent ground-glass opacities in the lingula. The index mass in the right upper lobe is 1.6 cm, stable. There is a new ground-glass opacity in the right middle lobe, as well  as individual ground-glass nodules in the medial segment of the right middle lobe. Upper Abdomen: No acute abnormality. Musculoskeletal: Sclerotic lesion in T9 vertebral body is stable without evidence of pathologic fracture. IMPRESSION: Worsening of innumerable bilateral pulmonary masses. The previous seen individual solid and ground-glass pulmonary masses in the left lower lobe have coalesced. Enlargement of pre-existing pulmonary masses an interval development of ground-glass sub solid masses in the right middle lobe. Stable predominantly sclerotic in appearance metastatic lesion in T9 vertebral body with evidence pathologic fracture. Small pericardial junction. Calcific atherosclerotic disease of the aorta and coronary arteries. Electronically Signed   By: Fidela Salisbury M.D.   On: 06/29/2016 16:11   Dg Chest Port 1 View  Result Date: 06/29/2016 CLINICAL DATA:  Sirs EXAM: PORTABLE CHEST 1 VIEW COMPARISON:  Yesterday FINDINGS: Chronic left lower lobe airspace disease correlating with adenocarcinoma. Multiple sub solid pulmonary nodules by CT are not visible radiographically. No superimposed consolidation, edema, effusion, or pneumothorax. IMPRESSION: Chronic airspace disease from adenocarcinoma. No detectable pneumonia. Electronically Signed   By: Monte Fantasia M.D.   On: 06/29/2016 07:17   Dg Chest Port 1 View  Result Date: 06/28/2016 CLINICAL DATA:  Increased weakness and shortness of breath. History of lung cancer. EXAM: PORTABLE CHEST 1 VIEW COMPARISON:  Chest CT 05/22/2016 FINDINGS: Hazy left perihilar opacity correlates with adenocarcinoma seen on previous CT. Multiple sub solid pulmonary nodules are not visible radiographically. No evidence of superimposed pneumonia. No effusion or pneumothorax. Normal heart size mediastinal contours. IMPRESSION: Multi focal pulmonary adenocarcinoma. No evidence of acute superimposed disease. Electronically Signed   By: Monte Fantasia M.D.   On: 06/28/2016  10:28    ASSESSMENT AND PLAN: This is a very pleasant 80 years old African-American male with metastatic non-small cell lung cancer, adenocarcinoma. The molecular study showed no actionable mutations. The patient is currently undergoing systemic chemotherapy with carboplatin and Alimta status post 6 cycles. He tolerated the last cycle of his treatment fairly well with no significant complaints except for mild fatigue. Imaging studies after the induction chemotherapy showed no evidence for disease progression.  The patient completed 2 cycles of maintenance systemic chemotherapy with single agent Alimta and tolerated his treatment well.  Unfortunately the recent CT scan of the chest performed during his hospitalization showed evidence for disease progression. I discussed the scan results with the patient and his family. I gave him the option of palliative care and hospice referral versus consideration of treatment with second line treatment with immunotherapy with Nivolumab 240 MG IV every 2 weeks. He is interested in proceeding with treatment with immunotherapy. I discussed with the patient adverse effect of this treatment including but not limited to immune mediated skin rash, diarrhea, interstitial lung disease, liver, renal, thyroid or other endocrine dysfunction. He is expected to start the first cycle of this treatment in 2 weeks after improvement of his condition and  recovery from the recent pneumonia. He would come back for follow-up visit in 4 weeks with the start of cycle #2. He was advised to call immediately if he has any concerning symptoms in the interval. The patient voices understanding of current disease status and treatment options and is in agreement with the current care plan.  All questions were answered. The patient knows to call the clinic with any problems, questions or concerns. We can certainly see the patient much sooner if necessary.  Disclaimer: This note was dictated with  voice recognition software. Similar sounding words can inadvertently be transcribed and may not be corrected upon review.

## 2016-07-21 IMAGING — MR MR HEAD WO/W CM
10 of 14 series · 34 of 48 positions shown · IV contrast (Yes)
Comparison: PET-CT 12/10/2015

CLINICAL DATA: 79-year-old male with recent diagnosis of non-small
cell lung cancer. Staging. Subsequent encounter.

EXAM:
MRI HEAD WITHOUT AND WITH CONTRAST
TECHNIQUE: Multiplanar, multiecho pulse sequences of the brain and surrounding
structures were obtained without and with intravenous contrast.
CONTRAST:  20mL MULTIHANCE GADOBENATE DIMEGLUMINE 529 MG/ML IV SOLN

[Series 4: DWI · axial · 3.0mm · 1.09mm/px · z∈[-63,+93]mm · 9 of 106 slices shown (1 of 4)]
[im 1/106]
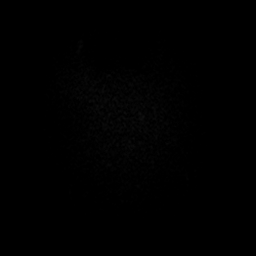
[im 14/106]
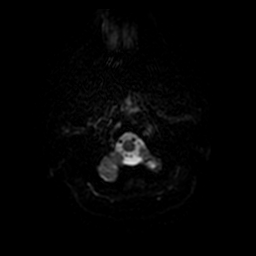
[im 27/106]
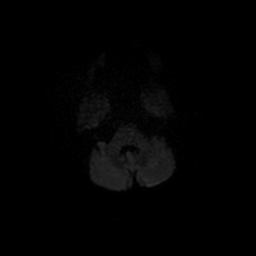
[im 40/106]
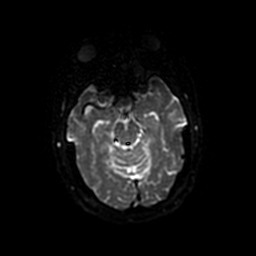
[im 53/106]
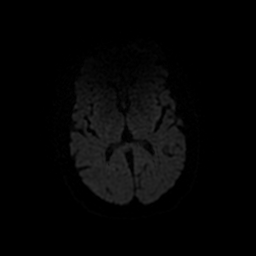
[im 66/106]
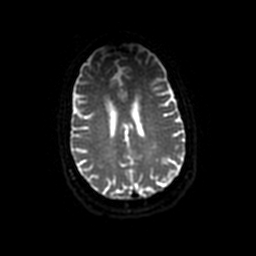
[im 79/106]
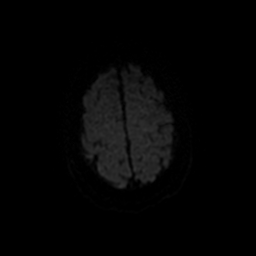
[im 92/106]
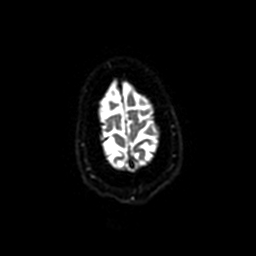
[im 106/106]
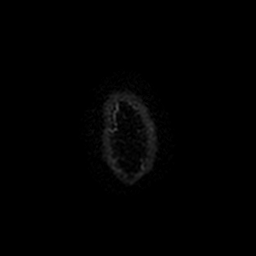

[Series 5: DWI · coronal · 5.0mm · 1.09mm/px · 6 of 69 slices shown (2 of 4)]
[im 1/69]
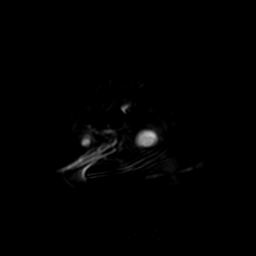
[im 14/69]
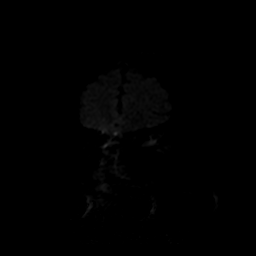
[im 28/69]
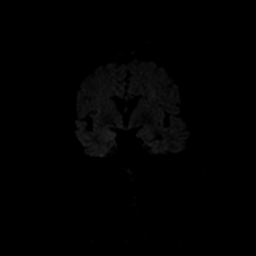
[im 41/69]
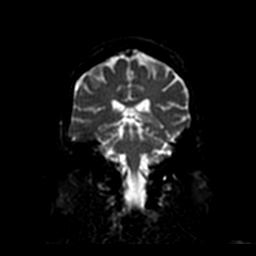
[im 55/69]
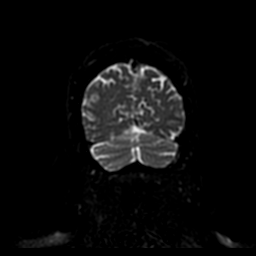
[im 69/69]
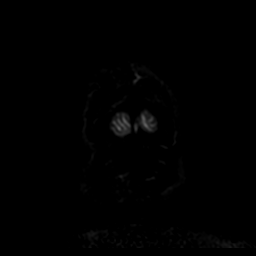

[Series 6: T2 · axial · 5.0mm · 0.45mm/px · z∈[-65,+89]mm · 2 of 25 slices shown (1 of 2)]
[im 1/25]
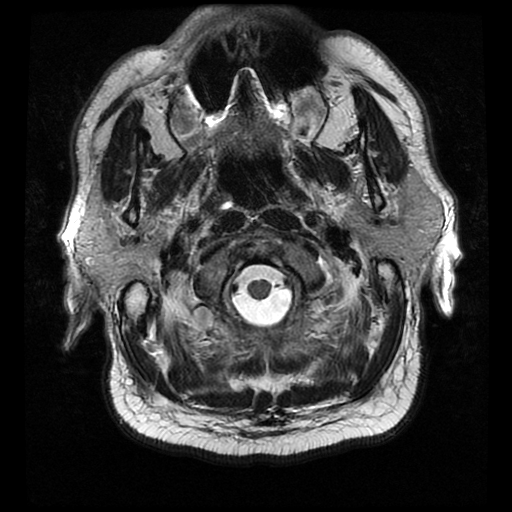
[im 25/25]
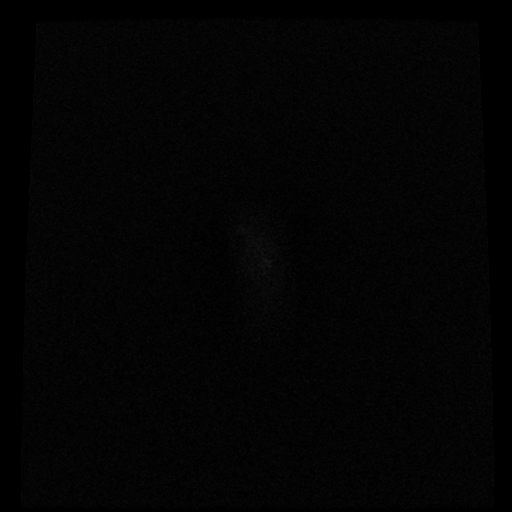

[Series 7: FLAIR · axial · 5.0mm · 0.43mm/px · z∈[-68,+93]mm · 2 of 28 slices shown]
[im 1/28]
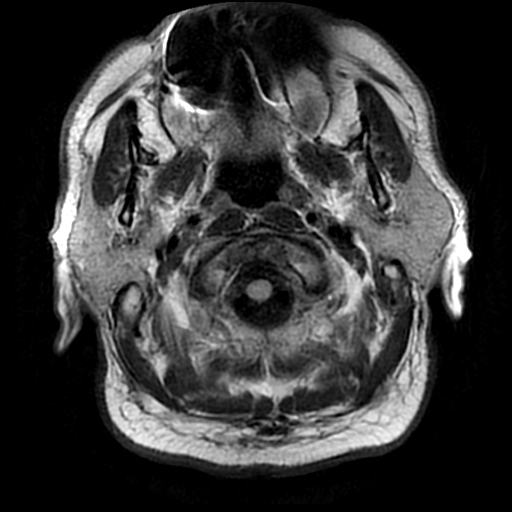
[im 28/28]
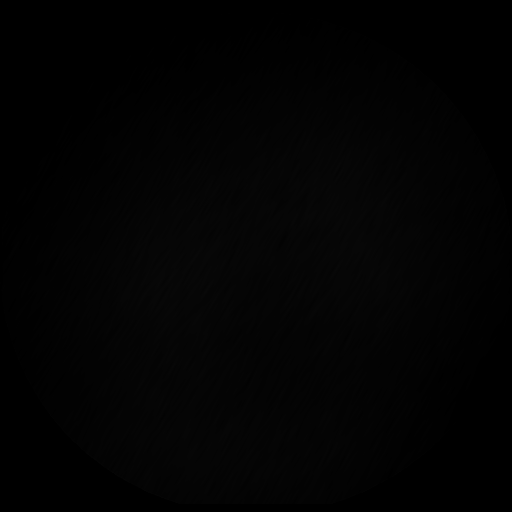

[Series 8: T2 · axial · 5.0mm · 0.43mm/px · z∈[-68,+93]mm · 2 of 28 slices shown (2 of 2)]
[im 1/28]
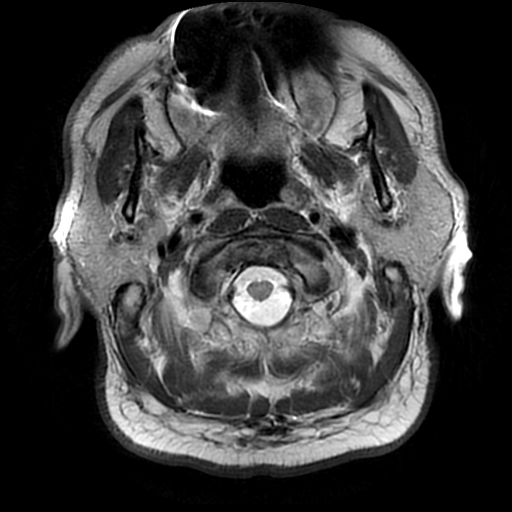
[im 28/28]
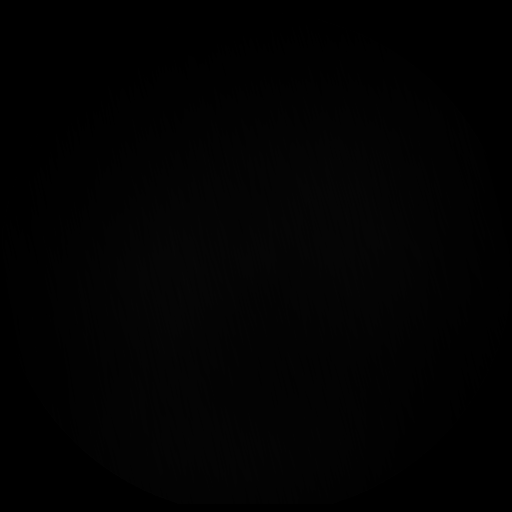

[Series 11: T2 post-contrast · coronal · 5.0mm · 0.45mm/px · 2 of 30 slices shown]
[im 1/30]
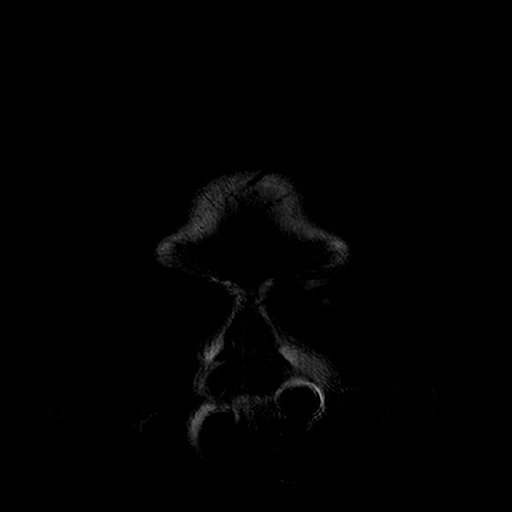
[im 30/30]
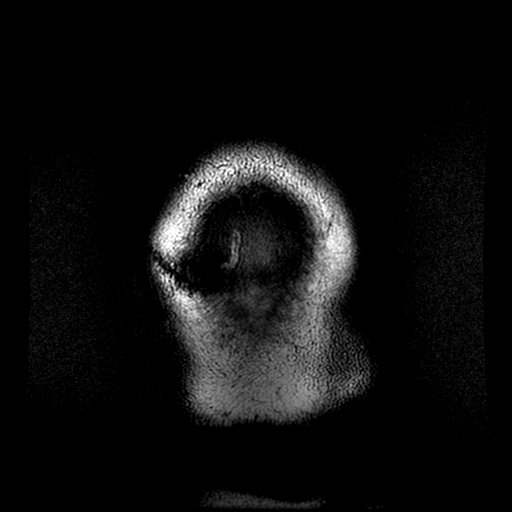

[Series 13: T1 post-contrast · coronal · 5.0mm · 0.45mm/px · 2 of 30 slices shown (1 of 2)]
[im 1/30]
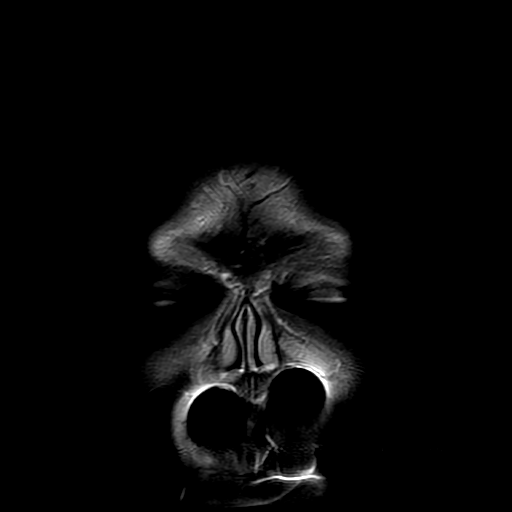
[im 30/30]
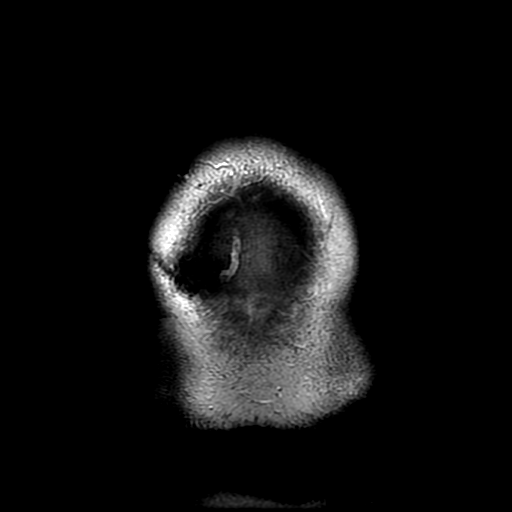

[Series 14: T1 post-contrast · sagittal · 5.0mm · 0.47mm/px · 2 of 24 slices shown (2 of 2)]
[im 1/24]
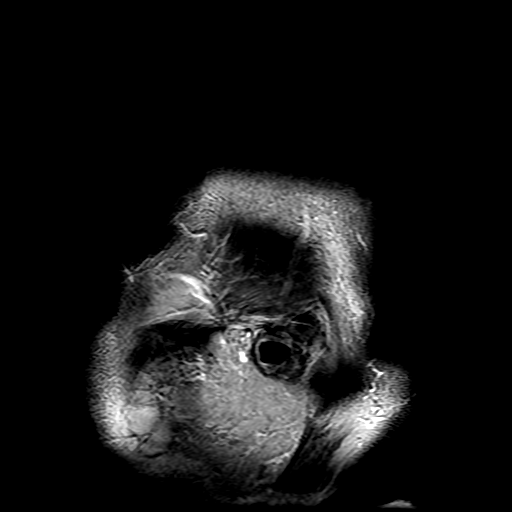
[im 24/24]
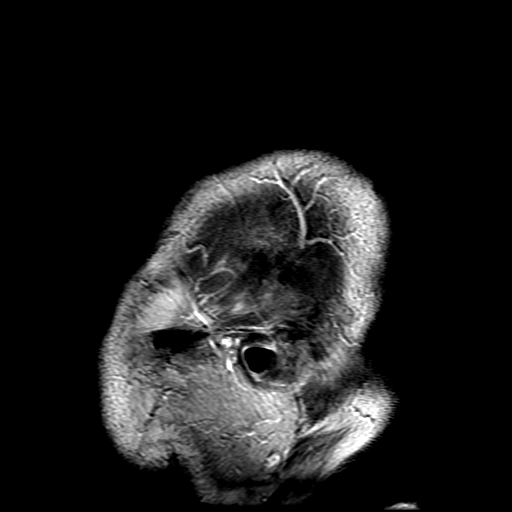

[Series 400: DWI · axial · 3.0mm · 1.09mm/px · z∈[-63,+93]mm · 4 of 52 slices shown (3 of 4)]
[im 1/52]
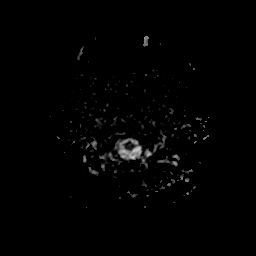
[im 18/52]
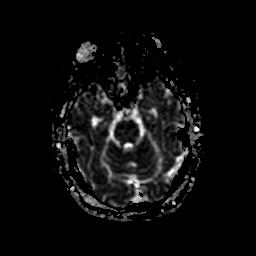
[im 35/52]
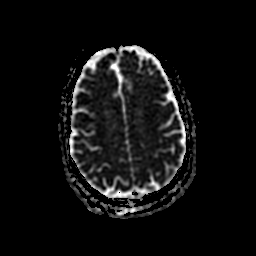
[im 52/52]
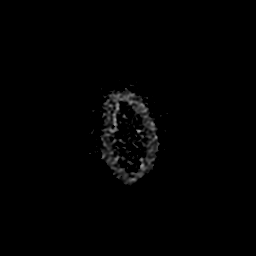

[Series 500: DWI · coronal · 5.0mm · 1.09mm/px · 3 of 36 slices shown (4 of 4)]
[im 1/36]
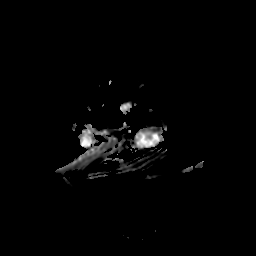
[im 18/36]
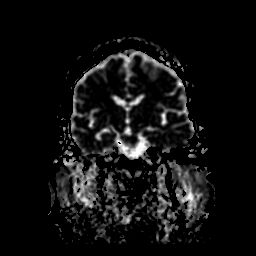
[im 36/36]
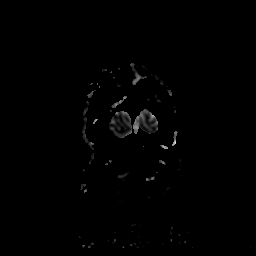

[34 of 48 positions shown; findings below may reference images not displayed]

FINDINGS: No midline shift, mass effect, or evidence of intracranial mass
lesion. No abnormal enhancement identified. No dural thickening
identified. Negative visualized spinal cord. Visible cervical spine
remarkable for abnormal T1 marrow signal in the left C3-C4 posterior
elements, but without apparent enhancement -probably is degenerative
in nature. Other visible bone marrow signal is within normal limits.

No restricted diffusion to suggest acute infarction. No
ventriculomegaly, extra-axial collection or acute intracranial
hemorrhage. Cervicomedullary junction and pituitary are within
normal limits. Major intracranial vascular flow voids are within
normal limits. Mild for age scattered nonspecific cerebral white
matter T2 and FLAIR hyperintensity. Suggestion of a chronic micro
hemorrhage in the right periatrial white matter series 9, image 12,
no other chronic cerebral blood products No cortical
encephalomalacia. Identified.

Can't exclude a punctate focus of abnormal enhancement along the
right seventh and eighth cranial nerves segments which appear normal
on T2 weighted imaging (series 12, image 16). Otherwise negative
visualized internal auditory structures. Mastoids are clear. Trace
paranasal sinus mucosal thickening. Postoperative changes to both
globes. Negative orbit and scalp soft tissues.
IMPRESSION: 1. No definite metastatic disease or acute intracranial abnormality.
Attention directed on followup to the right IAC to confirm apparent
punctate enhancement is stable, probably reflecting vascular
artifact.
2. Suspect degenerative bone marrow signal changes at the left C3-C4
facets.

## 2016-07-26 ENCOUNTER — Other Ambulatory Visit (HOSPITAL_BASED_OUTPATIENT_CLINIC_OR_DEPARTMENT_OTHER): Payer: Medicare Other

## 2016-07-26 ENCOUNTER — Ambulatory Visit (HOSPITAL_BASED_OUTPATIENT_CLINIC_OR_DEPARTMENT_OTHER): Payer: Medicare Other

## 2016-07-26 VITALS — BP 107/63 | HR 63 | Temp 98.3°F | Resp 18

## 2016-07-26 DIAGNOSIS — C3432 Malignant neoplasm of lower lobe, left bronchus or lung: Secondary | ICD-10-CM

## 2016-07-26 DIAGNOSIS — Z5112 Encounter for antineoplastic immunotherapy: Secondary | ICD-10-CM | POA: Diagnosis not present

## 2016-07-26 DIAGNOSIS — C3491 Malignant neoplasm of unspecified part of right bronchus or lung: Secondary | ICD-10-CM

## 2016-07-26 LAB — COMPREHENSIVE METABOLIC PANEL
ALK PHOS: 69 U/L (ref 40–150)
ALT: 13 U/L (ref 0–55)
ANION GAP: 12 meq/L — AB (ref 3–11)
AST: 11 U/L (ref 5–34)
Albumin: 3 g/dL — ABNORMAL LOW (ref 3.5–5.0)
BILIRUBIN TOTAL: 0.29 mg/dL (ref 0.20–1.20)
BUN: 15.1 mg/dL (ref 7.0–26.0)
CALCIUM: 9.2 mg/dL (ref 8.4–10.4)
CO2: 19 meq/L — AB (ref 22–29)
CREATININE: 1.1 mg/dL (ref 0.7–1.3)
Chloride: 107 mEq/L (ref 98–109)
EGFR: 73 mL/min/{1.73_m2} — AB (ref >90)
Glucose: 241 mg/dl — ABNORMAL HIGH (ref 70–140)
Potassium: 4 mEq/L (ref 3.5–5.1)
Sodium: 138 mEq/L (ref 136–145)
TOTAL PROTEIN: 6.5 g/dL (ref 6.4–8.3)

## 2016-07-26 LAB — CBC WITH DIFFERENTIAL/PLATELET
BASO%: 0 % (ref 0.0–2.0)
Basophils Absolute: 0 10*3/uL (ref 0.0–0.1)
EOS ABS: 0 10*3/uL (ref 0.0–0.5)
EOS%: 0 % (ref 0.0–7.0)
HEMATOCRIT: 34.3 % — AB (ref 38.4–49.9)
HGB: 11.8 g/dL — ABNORMAL LOW (ref 13.0–17.1)
LYMPH#: 0.6 10*3/uL — AB (ref 0.9–3.3)
LYMPH%: 5.2 % — AB (ref 14.0–49.0)
MCH: 31.4 pg (ref 27.2–33.4)
MCHC: 34.4 g/dL (ref 32.0–36.0)
MCV: 91.2 fL (ref 79.3–98.0)
MONO#: 0.1 10*3/uL (ref 0.1–0.9)
MONO%: 1 % (ref 0.0–14.0)
NEUT%: 93.8 % — AB (ref 39.0–75.0)
NEUTROS ABS: 11.1 10*3/uL — AB (ref 1.5–6.5)
PLATELETS: 112 10*3/uL — AB (ref 140–400)
RBC: 3.76 10*6/uL — AB (ref 4.20–5.82)
RDW: 14.6 % (ref 11.0–14.6)
WBC: 11.8 10*3/uL — AB (ref 4.0–10.3)

## 2016-07-26 MED ORDER — SODIUM CHLORIDE 0.9 % IV SOLN
Freq: Once | INTRAVENOUS | Status: AC
  Administered 2016-07-26: 11:00:00 via INTRAVENOUS

## 2016-07-26 MED ORDER — SODIUM CHLORIDE 0.9 % IV SOLN
240.0000 mg | Freq: Once | INTRAVENOUS | Status: AC
  Administered 2016-07-26: 240 mg via INTRAVENOUS
  Filled 2016-07-26: qty 20

## 2016-07-26 NOTE — Patient Instructions (Signed)
[facility] Discharge Instructions for Patients Receiving Chemotherapy  Today you received the following chemotherapy agents:  Nivolumab  To help prevent nausea and vomiting after your treatment, we encourage you to take your nausea medication as prescribed.   If you develop nausea and vomiting that is not controlled by your nausea medication, call the clinic.   BELOW ARE SYMPTOMS THAT SHOULD BE REPORTED IMMEDIATELY:  *FEVER GREATER THAN 100.5 F  *CHILLS WITH OR WITHOUT FEVER  NAUSEA AND VOMITING THAT IS NOT CONTROLLED WITH YOUR NAUSEA MEDICATION  *UNUSUAL SHORTNESS OF BREATH  *UNUSUAL BRUISING OR BLEEDING  TENDERNESS IN MOUTH AND THROAT WITH OR WITHOUT PRESENCE OF ULCERS  *URINARY PROBLEMS  *BOWEL PROBLEMS  UNUSUAL RASH Items with * indicate a potential emergency and should be followed up as soon as possible.  Feel free to call the clinic you have any questions or concerns. The clinic phone number is [phone].  Please show the CHEMO ALERT CARD at check-in to the Emergency Department and triage nurse.    Nivolumab injection What is this medicine? NIVOLUMAB (nye VOL ue mab) is a monoclonal antibody. It is used to treat melanoma, lung cancer, kidney cancer, and Hodgkin lymphoma. This medicine may be used for other purposes; ask your health care provider or pharmacist if you have questions. What should I tell my health care provider before I take this medicine? They need to know if you have any of these conditions: -diabetes -immune system problems -kidney disease -liver disease -lung disease -organ transplant -stomach or intestine problems -thyroid disease -an unusual or allergic reaction to nivolumab, other medicines, foods, dyes, or preservatives -pregnant or trying to get pregnant -breast-feeding How should I use this medicine? This medicine is for infusion into a vein. It is given by a health care professional in a hospital or clinic  setting. A special MedGuide will be given to you before each treatment. Be sure to read this information carefully each time. Talk to your pediatrician regarding the use of this medicine in children. Special care may be needed. Overdosage: If you think you have taken too much of this medicine contact a poison control center or emergency room at once. NOTE: This medicine is only for you. Do not share this medicine with others. What if I miss a dose? It is important not to miss your dose. Call your doctor or health care professional if you are unable to keep an appointment. What may interact with this medicine? Interactions have not been studied. Give your health care provider a list of all the medicines, herbs, non-prescription drugs, or dietary supplements you use. Also tell them if you smoke, drink alcohol, or use illegal drugs. Some items may interact with your medicine. This list may not describe all possible interactions. Give your health care provider a list of all the medicines, herbs, non-prescription drugs, or dietary supplements you use. Also tell them if you smoke, drink alcohol, or use illegal drugs. Some items may interact with your medicine. What should I watch for while using this medicine? This drug may make you feel generally unwell. Continue your course of treatment even though you feel ill unless your doctor tells you to stop. You may need blood work done while you are taking this medicine. Do not become pregnant while taking this medicine or for 5 months after stopping it. Women should inform their doctor if they wish to become pregnant or think they might be pregnant. There is a potential for serious side effects   to an unborn child. Talk to your health care professional or pharmacist for more information. Do not breast-feed an infant while taking this medicine. What side effects may I notice from receiving this medicine? Side effects that you should report to your doctor or health  care professional as soon as possible: -allergic reactions like skin rash, itching or hives, swelling of the face, lips, or tongue -black, tarry stools -blood in the urine -bloody or watery diarrhea -changes in vision -change in sex drive -changes in emotions or moods -chest pain -confusion -cough -decreased appetite -diarrhea -facial flushing -feeling faint or lightheaded -fever, chills -hair loss -hallucination, loss of contact with reality -headache -irritable -joint pain -loss of memory -muscle pain -muscle weakness -seizures -shortness of breath -signs and symptoms of high blood sugar such as dizziness; dry mouth; dry skin; fruity breath; nausea; stomach pain; increased hunger or thirst; increased urination -signs and symptoms of kidney injury like trouble passing urine or change in the amount of urine -signs and symptoms of liver injury like dark yellow or brown urine; general ill feeling or flu-like symptoms; light-colored stools; loss of appetite; nausea; right upper belly pain; unusually weak or tired; yellowing of the eyes or skin -stiff neck -swelling of the ankles, feet, hands -weight gain Side effects that usually do not require medical attention (report to your doctor or health care professional if they continue or are bothersome): -bone pain -constipation -tiredness -vomiting This list may not describe all possible side effects. Call your doctor for medical advice about side effects. You may report side effects to FDA at 1-800-FDA-1088. Where should I keep my medicine? This drug is given in a hospital or clinic and will not be stored at home. NOTE: This sheet is a summary. It may not cover all possible information. If you have questions about this medicine, talk to your doctor, pharmacist, or health care provider.    2016, [employer]vier/Gold Standard. (2015-02-17 10:03:42)  

## 2016-08-02 ENCOUNTER — Ambulatory Visit: Payer: Medicare Other

## 2016-08-02 ENCOUNTER — Ambulatory Visit: Payer: Medicare Other | Admitting: Nurse Practitioner

## 2016-08-02 ENCOUNTER — Other Ambulatory Visit: Payer: Medicare Other

## 2016-08-09 ENCOUNTER — Other Ambulatory Visit (HOSPITAL_BASED_OUTPATIENT_CLINIC_OR_DEPARTMENT_OTHER): Payer: Medicare Other

## 2016-08-09 ENCOUNTER — Ambulatory Visit (HOSPITAL_BASED_OUTPATIENT_CLINIC_OR_DEPARTMENT_OTHER): Payer: Medicare Other

## 2016-08-09 ENCOUNTER — Ambulatory Visit (HOSPITAL_BASED_OUTPATIENT_CLINIC_OR_DEPARTMENT_OTHER): Payer: Medicare Other | Admitting: Nurse Practitioner

## 2016-08-09 VITALS — BP 126/66 | HR 65 | Temp 97.7°F | Resp 16 | Ht 74.0 in | Wt 233.5 lb

## 2016-08-09 DIAGNOSIS — M8588 Other specified disorders of bone density and structure, other site: Secondary | ICD-10-CM | POA: Diagnosis not present

## 2016-08-09 DIAGNOSIS — C3432 Malignant neoplasm of lower lobe, left bronchus or lung: Secondary | ICD-10-CM

## 2016-08-09 DIAGNOSIS — C3491 Malignant neoplasm of unspecified part of right bronchus or lung: Secondary | ICD-10-CM

## 2016-08-09 DIAGNOSIS — Z5112 Encounter for antineoplastic immunotherapy: Secondary | ICD-10-CM

## 2016-08-09 DIAGNOSIS — Z5111 Encounter for antineoplastic chemotherapy: Secondary | ICD-10-CM

## 2016-08-09 DIAGNOSIS — Z79899 Other long term (current) drug therapy: Secondary | ICD-10-CM

## 2016-08-09 LAB — CBC WITH DIFFERENTIAL/PLATELET
BASO%: 1.4 % (ref 0.0–2.0)
Basophils Absolute: 0.2 10*3/uL — ABNORMAL HIGH (ref 0.0–0.1)
EOS%: 0 % (ref 0.0–7.0)
Eosinophils Absolute: 0 10*3/uL (ref 0.0–0.5)
HCT: 38.6 % (ref 38.4–49.9)
HGB: 12.5 g/dL — ABNORMAL LOW (ref 13.0–17.1)
LYMPH#: 0.5 10*3/uL — AB (ref 0.9–3.3)
LYMPH%: 4.4 % — AB (ref 14.0–49.0)
MCH: 30.4 pg (ref 27.2–33.4)
MCHC: 32.3 g/dL (ref 32.0–36.0)
MCV: 94.1 fL (ref 79.3–98.0)
MONO#: 0.5 10*3/uL (ref 0.1–0.9)
MONO%: 4.5 % (ref 0.0–14.0)
NEUT#: 10.5 10*3/uL — ABNORMAL HIGH (ref 1.5–6.5)
NEUT%: 89.7 % — AB (ref 39.0–75.0)
Platelets: 152 10*3/uL (ref 140–400)
RBC: 4.1 10*6/uL — AB (ref 4.20–5.82)
RDW: 15.4 % — ABNORMAL HIGH (ref 11.0–14.6)
WBC: 11.7 10*3/uL — ABNORMAL HIGH (ref 4.0–10.3)

## 2016-08-09 LAB — COMPREHENSIVE METABOLIC PANEL
ALT: 14 U/L (ref 0–55)
AST: 12 U/L (ref 5–34)
Albumin: 3 g/dL — ABNORMAL LOW (ref 3.5–5.0)
Alkaline Phosphatase: 60 U/L (ref 40–150)
Anion Gap: 11 mEq/L (ref 3–11)
BUN: 17.5 mg/dL (ref 7.0–26.0)
CHLORIDE: 110 meq/L — AB (ref 98–109)
CO2: 18 meq/L — AB (ref 22–29)
CREATININE: 1.2 mg/dL (ref 0.7–1.3)
Calcium: 9.4 mg/dL (ref 8.4–10.4)
EGFR: 65 mL/min/{1.73_m2} — ABNORMAL LOW (ref >90)
Glucose: 147 mg/dl — ABNORMAL HIGH (ref 70–140)
Potassium: 3.9 mEq/L (ref 3.5–5.1)
SODIUM: 139 meq/L (ref 136–145)
Total Bilirubin: 0.39 mg/dL (ref 0.20–1.20)
Total Protein: 6.9 g/dL (ref 6.4–8.3)

## 2016-08-09 LAB — TSH: TSH: 1.86 m(IU)/L (ref 0.320–4.118)

## 2016-08-09 MED ORDER — NIVOLUMAB CHEMO INJECTION 100 MG/10ML
240.0000 mg | Freq: Once | INTRAVENOUS | Status: AC
  Administered 2016-08-09: 240 mg via INTRAVENOUS
  Filled 2016-08-09: qty 20

## 2016-08-09 MED ORDER — SODIUM CHLORIDE 0.9 % IV SOLN
Freq: Once | INTRAVENOUS | Status: AC
  Administered 2016-08-09: 16:00:00 via INTRAVENOUS

## 2016-08-09 NOTE — Patient Instructions (Signed)
[facility] Discharge Instructions for Patients Receiving Chemotherapy  Today you received the following chemotherapy agents Nivolumab.  To help prevent nausea and vomiting after your treatment, we encourage you to take your nausea medication as prescribed.   If you develop nausea and vomiting that is not controlled by your nausea medication, call the clinic.   BELOW ARE SYMPTOMS THAT SHOULD BE REPORTED IMMEDIATELY:  *FEVER GREATER THAN 100.5 F  *CHILLS WITH OR WITHOUT FEVER  NAUSEA AND VOMITING THAT IS NOT CONTROLLED WITH YOUR NAUSEA MEDICATION  *UNUSUAL SHORTNESS OF BREATH  *UNUSUAL BRUISING OR BLEEDING  TENDERNESS IN MOUTH AND THROAT WITH OR WITHOUT PRESENCE OF ULCERS  *URINARY PROBLEMS  *BOWEL PROBLEMS  UNUSUAL RASH Items with * indicate a potential emergency and should be followed up as soon as possible.  Feel free to call the clinic you have any questions or concerns. The clinic phone number is [phone].  Please show the CHEMO ALERT CARD at check-in to the Emergency Department and triage nurse.   

## 2016-08-09 NOTE — Progress Notes (Signed)
  Matthew Berger OFFICE PROGRESS NOTE   DIAGNOSIS: Stage IV (T3, N2, M1b) non-small cell lung cancer, adenocarcinoma with negative EGFR mutation, negative ALK gene translocation, negative ROS 1, presented with large left lower lobe consolidative mass in addition to bilateral pulmonary nodules, right hilar and subcarinal lymphadenopathy as well as suspicious bony lesion diagnosed in February 2017.  PRIOR THERAPY:  1) Systemic chemotherapy with carboplatin for AUC of 5 and Alimta 500 MG/M2 every 3 weeks. First dose 12/29/2015. Status post 6 cycles. Last dose was given 04/19/2016 with stable disease. 2) Maintenance systemic chemotherapy with single agent Alimta 500 MG/M2 every 3 weeks. First dose 05/31/2016. Status post 2 cycles, last dose was given 06/21/2016 discontinued secondary to disease progression.  CURRENT THERAPY: Immunotherapy with Nivolumab 240 mg IV every 2 weeks. First dose 07/26/2016.   INTERVAL HISTORY:   Matthew Berger returns as scheduled. He completed cycle 1 nivolumab 07/26/2016. He denies nausea/vomiting. No mouth sores. No diarrhea. No rash. He notes improvement in the dyspnea with supplemental oxygen. He is coughing less. He reports a good appetite.  Objective:  Vital signs in last 24 hours:  Blood pressure 126/66, pulse 65, temperature 97.7 F (36.5 C), temperature source Oral, resp. rate 16, height _0  (1.88 m), weight 233 lb 8 oz (105.9 kg), SpO2 97 %.    HEENT: No thrush or ulcers. Resp: Faint rales at both lung bases. No respiratory distress. Cardio: Regular rate and rhythm. GI: Abdomen soft and nontender. No hepatomegaly. Vascular: No leg edema.   Lab Results:  Lab Results  Component Value Date   WBC 11.7 (H) 08/09/2016   HGB 12.5 (L) 08/09/2016   HCT 38.6 08/09/2016   MCV 94.1 08/09/2016   PLT 152 08/09/2016   NEUTROABS 10.5 (H) 08/09/2016    Imaging:  No results found.  Medications: I have reviewed the patient's current  medications.  Assessment/Plan: 1. Metastatic non-small cell lung cancer status post carboplatin/Alimta 6 cycles 12/29/2015 through 04/19/2016 with stable disease; maintenance therapy with single agent Alimta beginning 05/31/2016 status post 2 cycles with subsequent discontinuation due to disease progression. Cycle 1 nivolumab 07/26/2016.   Disposition:Matthew Berger appears stable. He has completed 1 cycle of nivolumab. Plan to proceed with cycle 2 today as scheduled. He will return for a follow-up visit and cycle 3 in 2 weeks. He will contact the office in the interim with any problems.    Ned Card ANP/GNP-BC   08/09/2016  1:43 PM

## 2016-08-18 ENCOUNTER — Ambulatory Visit (INDEPENDENT_AMBULATORY_CARE_PROVIDER_SITE_OTHER): Payer: Medicare Other | Admitting: Pulmonary Disease

## 2016-08-18 ENCOUNTER — Encounter: Payer: Self-pay | Admitting: Pulmonary Disease

## 2016-08-18 VITALS — BP 118/70 | HR 69 | Ht 73.0 in | Wt 231.0 lb

## 2016-08-18 DIAGNOSIS — R0602 Shortness of breath: Secondary | ICD-10-CM

## 2016-08-18 DIAGNOSIS — Z23 Encounter for immunization: Secondary | ICD-10-CM

## 2016-08-18 DIAGNOSIS — R0600 Dyspnea, unspecified: Secondary | ICD-10-CM

## 2016-08-18 DIAGNOSIS — J9601 Acute respiratory failure with hypoxia: Secondary | ICD-10-CM | POA: Insufficient documentation

## 2016-08-18 DIAGNOSIS — C3490 Malignant neoplasm of unspecified part of unspecified bronchus or lung: Secondary | ICD-10-CM

## 2016-08-18 LAB — PULMONARY FUNCTION TEST
DL/VA % PRED: 67 %
DL/VA: 3.22 ml/min/mmHg/L
DLCO COR: 16.69 ml/min/mmHg
DLCO cor % pred: 45 %
DLCO unc % pred: 44 %
DLCO unc: 16.25 ml/min/mmHg
FEF 25-75 PRE: 1.59 L/s
FEF2575-%PRED-PRE: 65 %
FEV1-%PRED-PRE: 78 %
FEV1-PRE: 2.4 L
FEV1FVC-%Pred-Pre: 96 %
FEV6-%Pred-Pre: 82 %
FEV6-PRE: 3.27 L
FEV6FVC-%PRED-PRE: 103 %
FVC-%PRED-PRE: 79 %
FVC-PRE: 3.32 L
PRE FEV6/FVC RATIO: 98 %
Pre FEV1/FVC ratio: 72 %

## 2016-08-18 NOTE — Progress Notes (Signed)
Test reviewed.  

## 2016-08-18 NOTE — Progress Notes (Signed)
Subjective:    Patient ID: Matthew Berger, male    DOB: 11/12/1935, 80 y.o.   MRN: [id number]  C.C.:  Follow-up for Stage IV (T3, N2, M1b) NSCLC LLL & Acute Hypoxic Respiratory Failure:  HPI Stage IV NSCLC LLL:  Adenocarcinoma on biopsy. Follows with medical oncology. Post 6 cycles of chemotherapy. Currently on immunotherapy with Nivolumab starting 07/26/16.   Acute Hypoxic Respiratory Failure: No anemia on CBC from 08/09/16. He reports his dyspnea persists. He is currently prescribed oxygen at night while sleeping. He is coughing intermittently, and mostly at night.   Review of Systems Denies any chest pain or pressure. Denies any lower extremity edema. He denies any orthopnea or PND. He has had some near syncope but no loss of consciousness. He does report he has near syncope with standing or sitting quickly. No fever, chills, or sweats.   No Known Allergies  Current Outpatient Prescriptions on File Prior to Visit  Medication Sig Dispense Refill  . amiodarone (PACERONE) 200 MG tablet Take 1 tablet (200 mg total) by mouth daily. Please call cardiology Dr Einar Gip for refills. 30 tablet 0  . aspirin 81 MG tablet Take 81 mg by mouth daily.    Marland Kitchen atorvastatin (LIPITOR) 20 MG tablet Take 20 mg by mouth daily.    . Brinzolamide-Brimonidine (SIMBRINZA) 1-0.2 % SUSP Apply 2 drops to eye 2 (two) times daily.    Marland Kitchen diltiazem (CARDIZEM CD) 180 MG 24 hr capsule Take 1 capsule (180 mg total) by mouth 2 (two) times daily. (Patient taking differently: Take 180 mg by mouth daily. ) 60 capsule 0  . dorzolamide (TRUSOPT) 2 % ophthalmic solution Place 1 drop into the left eye 2 (two) times daily.    . folic acid (FOLVITE) 1 MG tablet TAKE 1 TABLET (1 MG TOTAL) BY MOUTH DAILY.  0  . latanoprost (XALATAN) 0.005 % ophthalmic solution Place 1 drop into both eyes at bedtime.    . metoprolol (LOPRESSOR) 50 MG tablet Take 1 tablet (50 mg total) by mouth 2 (two) times daily. 60 tablet 0  . prochlorperazine  (COMPAZINE) 10 MG tablet TAKE 1 TABLET BY MOUTH EVERY 6 HOURS AS NEEDED FOR NAUSEA AND VOMITING 30 tablet 0  . tamsulosin (FLOMAX) 0.4 MG CAPS capsule Take 0.4 mg by mouth daily.    . vitamin B-12 (CYANOCOBALAMIN) 1000 MCG tablet Take 1,000 mcg by mouth daily.     No current facility-administered medications on file prior to visit.     Past Medical History:  Diagnosis Date  . Atrial fibrillation ([place])   . CHF (congestive heart failure) ([place])   . Encounter for antineoplastic chemotherapy 01/05/2016  . Encounter for antineoplastic immunotherapy 07/12/2016  . GERD (gastroesophageal reflux disease)   . Hypercholesteremia   . Hypertension   . Non-small cell lung cancer (NSCLC) ([place]) 11/19/15  . Pneumonia 11/18/2015    Past Surgical History:  Procedure Laterality Date  . CATARACT EXTRACTION Bilateral   . ROTATOR CUFF REPAIR Right   . VIDEO BRONCHOSCOPY Bilateral 11/19/2015   Procedure: VIDEO BRONCHOSCOPY WITH FLUORO;  Surgeon: Rigoberto Noel, MD;  Location: [facility];  Service: Cardiopulmonary;  Laterality: Bilateral;  . WISDOM TOOTH EXTRACTION      Family History  Problem Relation Age of Onset  . Breast cancer Sister   . Stroke Sister   . Colon cancer Brother   . Cancer Maternal Aunt     x2  . Hypertension Mother   . Stroke Mother   . Breast  cancer Cousin   . Diabetes Other   . Hypertension Other   . Hyperlipidemia Other     Social History   Social History  . Marital status: Married    Spouse name: N/A  . Number of children: Y8  . Years of education: N/A   Occupational History  . retired Administrator    Social History Main Topics  . Smoking status: Former Smoker    Packs/day: 2.00    Years: 40.00    Types: Cigarettes    Quit date: 10/02/1990  . Smokeless tobacco: Never Used     Comment: smoked 1-2 ppd.   . Alcohol use No  . Drug use: No  . Sexual activity: Not Currently   Other Topics Concern  . None   Social History Narrative   Originally from [place]. Always  lived in [place]. Previously has traveled to [place], [place], [place], [place], [place], [place], [place], & [place]. Previously worked driving garbage truck. No pets currently. No known mold exposure.       Objective:   Physical Exam BP 118/70 (BP Location: Left Arm, Cuff Size: Normal)   Pulse 69   Ht '6\' 1"'$  (1.854 m)   Wt 231 lb (104.8 kg)   SpO2 94%   BMI 30.48 kg/m  General:  Awake. No distress. Comfortable. Integument:  Warm & dry. No rash on exposed skin.  Lymphatics:  No appreciated cervical or supraclavicular lymphadenoapthy. HEENT:  Moist mucus membranes. Scleral icterus. No oral ulcers. Cardiovascular:  Regular rate and rhythm. No edema. Normal S1 & S2.  Pulmonary:  Clear bilaterally to auscultation. Normal work of breathing on room air at rest. Speaking in complete sentences. Neurological:  CN 2-12 grossly in tact. No meningismus. Strength seems symmetric.  PFT 08/18/16: FVC 3.32 L (79%) FEV1 2.40 L (78%) FEV1/FVC 0.72 FEF 25-75 1.59 L (65%)                                                                                                                 DLCO corrected 45% (Hgb 13.7) 04/03/16: FVC 3.44 L (82%) FEV1 2.57 L (83%) FEV1/FVC 0.75 FEF 25-75 1.87 L (76%) no bronchodilator response TLC 6.28 L (81%) RV 93% ERV 106% DLCO corrected 55% (Hgb 13.0)  6MWT 08/18/16:  Walked 294 meters / Baseline Sat 92% on RA / Nadir Sat 86% on RA (rewalked 3 laps with lowest saturation 89% on room air) 05/10/16:  Walked 294 meters / Baseline Sat 98% on RA / Nadir Sat 90% on RA  IMAGING MRI BRAIN W/ & W/O 12/17/15 (per radiologist):  No intracranial abnormality to suggest metastasis.   CT CHEST W/ 11/19/15 (previously reviewed by me): Subcarinal lymph node as well as right hilar lymph node measuring over 1 cm in short axis. Enlarging bilateral parenchymal nodules as well as persistent medial left lower lobe consolidation. No pleural effusion or thickening. No pericardial effusion.  CT CHEST W/ 10/06/15 (previously reviewed by me): Medial  left lower lobe consolidation with air bronchograms and some groundglass component. Satellite  nodules noted within left lower lobe, lingula, and left upper lobe. Right upper lobe nodules with largest measuring approximately 1.3 cm. No pericardial effusion. No pleural effusion or thickening. No pathologic mediastinal adenopathy.  CXR PA/LAT 09/09/15 (previously reviewed by me): Left hilar opacity. No pleural effusion or thickening appreciated. Heart normal in size. Mediastinum normal in contour.  PATHOLOGY  Bronchial Wash LLL 11/19/15:  Malignant cells consistent with non-small cell carcinoma.  Bronchial Wash RUL 11/19/15:  Atypical cells present. Bronchial Brush LLL 11/19/15: Malignant cells consistent with non-small cell carcinoma.  Endobronchial FNA/TBBx 11/19/15:  Adenocarcionma  MICROBIOLOGY BAL 11/19/15:  Oral Flora / AFB negative / Fungal negative  LABS 08/09/16 CBC: 11.7/12.5/38.6/152  11/24/15 CBC:  5.9/13.1/38.2/170 BMP:  140/4.1/106/28/11/1.15/113/9.1 Magnesium:  2.1  09/15/15 CBC: 4.6/14.6/42.8/151 BMP: 139/3.8/107/25/17/1.18/119/8.8 LFT: 3.7/6.3/0.6/75/16/13 LDH: 131    Assessment & Plan:  80 y.o. male with Stage IV adenocarcinoma of the left lung and continued dyspnea on exertion.  Patient has no evidence of anemia based on CBC from earlier this month.Continuing to monitor the patient's oxygen requirement closely. Instructed the patient to notify my office if he had any new breathing problems or questions before his next appointment.  1. Dyspnea on Exertion:  Checking 6 minute walk test on room air at next appointment. Checking an overnight oximetry on oxygen therapy.  2. Stage IV NSCLC: Follows with medical oncology. Completed 6 courses of chemotherapy. Continuing on Opdivo.  3. Health Maintenance:  Administering high-dose influenza vaccine today. 4. Follow-up: Patient to return to clinic in 4 weeks or sooner if needed.  Sonia Baller Ashok Cordia, M.D. [facility]  [facility] Pager:  [phone] After 3pm or if no response, call [phone] 11:29 AM 08/18/16

## 2016-08-18 NOTE — Patient Instructions (Addendum)
   Call me if you have any new breathing problems before your next appointment.  We will repeat your walking test at your next appointment.  I will see you back in 4 weeks or sooner if needed.  TESTS ORDERED: 1. 6MWT on room air at next appointment 2. Overnight Oximetry on room air

## 2016-08-22 ENCOUNTER — Other Ambulatory Visit: Payer: Self-pay | Admitting: *Deleted

## 2016-08-23 ENCOUNTER — Telehealth: Payer: Self-pay | Admitting: Internal Medicine

## 2016-08-23 ENCOUNTER — Ambulatory Visit: Payer: Medicare Other | Admitting: Internal Medicine

## 2016-08-23 ENCOUNTER — Other Ambulatory Visit: Payer: Medicare Other

## 2016-08-23 ENCOUNTER — Other Ambulatory Visit (HOSPITAL_BASED_OUTPATIENT_CLINIC_OR_DEPARTMENT_OTHER): Payer: Medicare Other

## 2016-08-23 ENCOUNTER — Encounter: Payer: Self-pay | Admitting: Internal Medicine

## 2016-08-23 ENCOUNTER — Ambulatory Visit: Payer: Medicare Other

## 2016-08-23 ENCOUNTER — Ambulatory Visit (HOSPITAL_BASED_OUTPATIENT_CLINIC_OR_DEPARTMENT_OTHER): Payer: Medicare Other | Admitting: Internal Medicine

## 2016-08-23 ENCOUNTER — Ambulatory Visit (HOSPITAL_BASED_OUTPATIENT_CLINIC_OR_DEPARTMENT_OTHER): Payer: Medicare Other

## 2016-08-23 VITALS — BP 110/59 | HR 65 | Temp 97.5°F | Resp 18 | Ht 73.0 in | Wt 232.3 lb

## 2016-08-23 DIAGNOSIS — Z5112 Encounter for antineoplastic immunotherapy: Secondary | ICD-10-CM | POA: Diagnosis not present

## 2016-08-23 DIAGNOSIS — R5383 Other fatigue: Secondary | ICD-10-CM

## 2016-08-23 DIAGNOSIS — C3432 Malignant neoplasm of lower lobe, left bronchus or lung: Secondary | ICD-10-CM

## 2016-08-23 DIAGNOSIS — C3491 Malignant neoplasm of unspecified part of right bronchus or lung: Secondary | ICD-10-CM

## 2016-08-23 LAB — COMPREHENSIVE METABOLIC PANEL
ALBUMIN: 2.9 g/dL — AB (ref 3.5–5.0)
ALK PHOS: 63 U/L (ref 40–150)
ALT: 11 U/L (ref 0–55)
ANION GAP: 9 meq/L (ref 3–11)
AST: 11 U/L (ref 5–34)
BILIRUBIN TOTAL: 0.59 mg/dL (ref 0.20–1.20)
BUN: 13.2 mg/dL (ref 7.0–26.0)
CO2: 21 mEq/L — ABNORMAL LOW (ref 22–29)
Calcium: 9.3 mg/dL (ref 8.4–10.4)
Chloride: 111 mEq/L — ABNORMAL HIGH (ref 98–109)
Creatinine: 1.4 mg/dL — ABNORMAL HIGH (ref 0.7–1.3)
EGFR: 58 mL/min/{1.73_m2} — AB (ref >90)
GLUCOSE: 141 mg/dL — AB (ref 70–140)
POTASSIUM: 3.6 meq/L (ref 3.5–5.1)
SODIUM: 142 meq/L (ref 136–145)
Total Protein: 6.7 g/dL (ref 6.4–8.3)

## 2016-08-23 LAB — CBC WITH DIFFERENTIAL/PLATELET
BASO%: 0.4 % (ref 0.0–2.0)
BASOS ABS: 0 10*3/uL (ref 0.0–0.1)
EOS ABS: 0.2 10*3/uL (ref 0.0–0.5)
EOS%: 4.1 % (ref 0.0–7.0)
HCT: 36 % — ABNORMAL LOW (ref 38.4–49.9)
HEMOGLOBIN: 12.4 g/dL — AB (ref 13.0–17.1)
LYMPH%: 14.5 % (ref 14.0–49.0)
MCH: 31.2 pg (ref 27.2–33.4)
MCHC: 34.4 g/dL (ref 32.0–36.0)
MCV: 90.5 fL (ref 79.3–98.0)
MONO#: 0.5 10*3/uL (ref 0.1–0.9)
MONO%: 9.3 % (ref 0.0–14.0)
NEUT#: 4 10*3/uL (ref 1.5–6.5)
NEUT%: 71.7 % (ref 39.0–75.0)
PLATELETS: 140 10*3/uL (ref 140–400)
RBC: 3.98 10*6/uL — AB (ref 4.20–5.82)
RDW: 14.2 % (ref 11.0–14.6)
WBC: 5.6 10*3/uL (ref 4.0–10.3)
lymph#: 0.8 10*3/uL — ABNORMAL LOW (ref 0.9–3.3)

## 2016-08-23 MED ORDER — NIVOLUMAB CHEMO INJECTION 100 MG/10ML
240.0000 mg | Freq: Once | INTRAVENOUS | Status: AC
  Administered 2016-08-23: 240 mg via INTRAVENOUS
  Filled 2016-08-23: qty 20

## 2016-08-23 MED ORDER — SODIUM CHLORIDE 0.9 % IV SOLN
Freq: Once | INTRAVENOUS | Status: AC
  Administered 2016-08-23: 14:00:00 via INTRAVENOUS

## 2016-08-23 NOTE — Progress Notes (Signed)
[facility] [phone]   Fax:([phone]  OFFICE PROGRESS NOTE  [name], MD [address] [place] [address]  DIAGNOSIS: Stage IV (T3, N2, M1b) non-small cell lung cancer, adenocarcinoma with negative EGFR mutation, negative ALK gene translocation, negative ROS 1, presented with large left lower lobe consolidative mass in addition to bilateral pulmonary nodules, right hilar and subcarinal lymphadenopathy as well as suspicious bony lesion diagnosed in February 2017.  PRIOR THERAPY:  1) Systemic chemotherapy with carboplatin for AUC of 5 and Alimta 500 MG/M2 every 3 weeks. First dose 12/29/2015. Status post 6 cycles. Last dose was given 04/19/2016 with stable disease. 2) Maintenance systemic chemotherapy with single agent Alimta 500 MG/M2 every 3 weeks. First dose 05/31/2016. Status post 2 cycles, last dose was given 06/21/2016 discontinued secondary to disease progression.  CURRENT THERAPY: Immunotherapy with Nivolumab 240 mg IV every 2 weeks. First dose 07/26/2016. Status post 2 cycles.  INTERVAL HISTORY: Matthew Berger 80 y.o. male returns to the clinic today for follow-up visit accompanied by several family members. The patient is feeling fine today with no specific complaints except for fatigue. He is currently on treatment with immunotherapy with Nivolumab status post 2 cycles. He is tolerating his treatment well with no significant adverse effects. He denied having any skin rash or diarrhea. He denied having any fever or chills. He has no nausea or vomiting. He is here today for evaluation before starting cycle #3.  MEDICAL HISTORY: Past Medical History:  Diagnosis Date  . Atrial fibrillation ([place])   . CHF (congestive heart failure) ([place])   . Encounter for antineoplastic chemotherapy 01/05/2016  . Encounter for antineoplastic immunotherapy 07/12/2016  . GERD (gastroesophageal reflux disease)   . Hypercholesteremia   . Hypertension   .  Non-small cell lung cancer (NSCLC) ([place]) 11/19/15  . Pneumonia 11/18/2015    ALLERGIES:  has No Known Allergies.  MEDICATIONS:  Current Outpatient Prescriptions  Medication Sig Dispense Refill  . aspirin 81 MG tablet Take 81 mg by mouth daily.    Marland Kitchen atorvastatin (LIPITOR) 20 MG tablet Take 20 mg by mouth daily.    . Brinzolamide-Brimonidine (SIMBRINZA) 1-0.2 % SUSP Apply 2 drops to eye 2 (two) times daily.    Marland Kitchen diltiazem (CARDIZEM CD) 180 MG 24 hr capsule Take 1 capsule (180 mg total) by mouth 2 (two) times daily. (Patient taking differently: Take 180 mg by mouth daily. ) 60 capsule 0  . dorzolamide (TRUSOPT) 2 % ophthalmic solution Place 1 drop into the left eye 2 (two) times daily.    . folic acid (FOLVITE) 1 MG tablet TAKE 1 TABLET (1 MG TOTAL) BY MOUTH DAILY.  0  . latanoprost (XALATAN) 0.005 % ophthalmic solution Place 1 drop into both eyes at bedtime.    . metoprolol (LOPRESSOR) 50 MG tablet Take 1 tablet (50 mg total) by mouth 2 (two) times daily. 60 tablet 0  . tamsulosin (FLOMAX) 0.4 MG CAPS capsule Take 0.4 mg by mouth daily.    . vitamin B-12 (CYANOCOBALAMIN) 1000 MCG tablet Take 1,000 mcg by mouth daily.    Marland Kitchen amiodarone (PACERONE) 200 MG tablet Take 1 tablet (200 mg total) by mouth daily. Please call cardiology Dr Einar Gip for refills. 30 tablet 0  . prochlorperazine (COMPAZINE) 10 MG tablet TAKE 1 TABLET BY MOUTH EVERY 6 HOURS AS NEEDED FOR NAUSEA AND VOMITING (Patient not taking: Reported on 08/23/2016) 30 tablet 0   No current facility-administered medications for this visit.  SURGICAL HISTORY:  Past Surgical History:  Procedure Laterality Date  . CATARACT EXTRACTION Bilateral   . ROTATOR CUFF REPAIR Right   . VIDEO BRONCHOSCOPY Bilateral 11/19/2015   Procedure: VIDEO BRONCHOSCOPY WITH FLUORO;  Surgeon: Oretha Milch, MD;  [facility] ENDOSCOPY;  Service: Cardiopulmonary;  Laterality: Bilateral;  . WISDOM TOOTH EXTRACTION      REVIEW OF SYSTEMS:  A comprehensive review  of systems was negative except for: Constitutional: positive for fatigue   PHYSICAL EXAMINATION: General appearance: alert, cooperative and no distress Head: Normocephalic, without obvious abnormality, atraumatic Neck: no adenopathy, no JVD, supple, symmetrical, trachea midline and thyroid not enlarged, symmetric, no tenderness/mass/nodules Lymph nodes: Cervical, supraclavicular, and axillary nodes normal. Resp: clear to auscultation bilaterally Back: symmetric, no curvature. ROM normal. No CVA tenderness. Cardio: regular rate and rhythm, S1, S2 normal, no murmur, click, rub or gallop GI: soft, non-tender; bowel sounds normal; no masses,  no organomegaly Extremities: extremities normal, atraumatic, no cyanosis or edema Neurologic: Alert and oriented X 3, normal strength and tone. Normal symmetric reflexes. Normal coordination and gait  ECOG PERFORMANCE STATUS: 1 - Symptomatic but completely ambulatory  Blood pressure (!) 110/59, pulse 65, temperature 97.5 F (36.4 C), temperature source Oral, resp. rate 18, height 6\' 1"  (1.854 m), weight 232 lb 4.8 oz (105.4 kg), SpO2 94 %.  LABORATORY DATA: Lab Results  Component Value Date   WBC 5.6 08/23/2016   HGB 12.4 (L) 08/23/2016   HCT 36.0 (L) 08/23/2016   MCV 90.5 08/23/2016   PLT 140 08/23/2016      Chemistry      Component Value Date/Time   NA 142 08/23/2016 1152   K 3.6 08/23/2016 1152   CL 106 07/06/2016 0527   CO2 21 (L) 08/23/2016 1152   BUN 13.2 08/23/2016 1152   CREATININE 1.4 (H) 08/23/2016 1152      Component Value Date/Time   CALCIUM 9.3 08/23/2016 1152   ALKPHOS 63 08/23/2016 1152   AST 11 08/23/2016 1152   ALT 11 08/23/2016 1152   BILITOT 0.59 08/23/2016 1152       RADIOGRAPHIC STUDIES: No results found.  ASSESSMENT AND PLAN: This is a very pleasant 80 years old African-American male with metastatic non-small cell lung cancer, adenocarcinoma. The molecular study showed no actionable mutations. The patient is  currently undergoing systemic chemotherapy with carboplatin and Alimta status post 6 cycles. He tolerated the last cycle of his treatment fairly well with no significant complaints except for mild fatigue. Imaging studies after the induction chemotherapy showed no evidence for disease progression.  The patient completed 2 cycles of maintenance systemic chemotherapy with single agent Alimta and tolerated his treatment well.  Unfortunately the recent CT scan of the chest performed during his hospitalization showed evidence for disease progression. He is currently undergoing treatment with immunotherapy with Nivolumab status post 2 cycles. He is tolerating his treatment well. I recommended for the patient to proceed with cycle #3 today as scheduled. He would come back for follow-up visit in 2 weeks with the start of cycle #4. He was advised to call immediately if he has any concerning symptoms in the interval. The patient voices understanding of current disease status and treatment options and is in agreement with the current care plan.  All questions were answered. The patient knows to call the clinic with any problems, questions or concerns. We can certainly see the patient much sooner if necessary.  Disclaimer: This note was dictated with voice recognition software. Similar sounding words  can inadvertently be transcribed and may not be corrected upon review.

## 2016-08-23 NOTE — Telephone Encounter (Signed)
Appointments scheduled per 11/22 LOS. Patient given AVS report and calendars with future scheduled appointments.

## 2016-08-23 NOTE — Patient Instructions (Signed)
[facility] Discharge Instructions for Patients Receiving Chemotherapy  Today you received the following chemotherapy agents:  Opdivo  To help prevent nausea and vomiting after your treatment, we encourage you to take your nausea medication as prescribed.   If you develop nausea and vomiting that is not controlled by your nausea medication, call the clinic.   BELOW ARE SYMPTOMS THAT SHOULD BE REPORTED IMMEDIATELY:  *FEVER GREATER THAN 100.5 F  *CHILLS WITH OR WITHOUT FEVER  NAUSEA AND VOMITING THAT IS NOT CONTROLLED WITH YOUR NAUSEA MEDICATION  *UNUSUAL SHORTNESS OF BREATH  *UNUSUAL BRUISING OR BLEEDING  TENDERNESS IN MOUTH AND THROAT WITH OR WITHOUT PRESENCE OF ULCERS  *URINARY PROBLEMS  *BOWEL PROBLEMS  UNUSUAL RASH Items with * indicate a potential emergency and should be followed up as soon as possible.  Feel free to call the clinic you have any questions or concerns. The clinic phone number is [phone].  Please show the CHEMO ALERT CARD at check-in to the Emergency Department and triage nurse.   

## 2016-08-25 ENCOUNTER — Telehealth: Payer: Self-pay | Admitting: *Deleted

## 2016-08-25 NOTE — Telephone Encounter (Signed)
Per LOS I have scheduled appts and notified the scheduler 

## 2016-09-04 ENCOUNTER — Telehealth: Payer: Self-pay | Admitting: Pulmonary Disease

## 2016-09-04 NOTE — Telephone Encounter (Signed)
lmtcb X1 for pt  

## 2016-09-06 ENCOUNTER — Other Ambulatory Visit (HOSPITAL_BASED_OUTPATIENT_CLINIC_OR_DEPARTMENT_OTHER): Payer: Medicare Other

## 2016-09-06 ENCOUNTER — Telehealth: Payer: Self-pay | Admitting: Internal Medicine

## 2016-09-06 ENCOUNTER — Ambulatory Visit (HOSPITAL_BASED_OUTPATIENT_CLINIC_OR_DEPARTMENT_OTHER): Payer: Medicare Other | Admitting: Internal Medicine

## 2016-09-06 ENCOUNTER — Encounter: Payer: Self-pay | Admitting: Internal Medicine

## 2016-09-06 ENCOUNTER — Ambulatory Visit (HOSPITAL_BASED_OUTPATIENT_CLINIC_OR_DEPARTMENT_OTHER): Payer: Medicare Other

## 2016-09-06 VITALS — BP 114/61 | HR 67 | Temp 97.4°F | Resp 18 | Ht 73.0 in | Wt 233.1 lb

## 2016-09-06 DIAGNOSIS — C7801 Secondary malignant neoplasm of right lung: Secondary | ICD-10-CM

## 2016-09-06 DIAGNOSIS — C3491 Malignant neoplasm of unspecified part of right bronchus or lung: Secondary | ICD-10-CM

## 2016-09-06 DIAGNOSIS — Z5112 Encounter for antineoplastic immunotherapy: Secondary | ICD-10-CM

## 2016-09-06 DIAGNOSIS — C3432 Malignant neoplasm of lower lobe, left bronchus or lung: Secondary | ICD-10-CM

## 2016-09-06 DIAGNOSIS — C7802 Secondary malignant neoplasm of left lung: Secondary | ICD-10-CM | POA: Diagnosis not present

## 2016-09-06 DIAGNOSIS — I4891 Unspecified atrial fibrillation: Secondary | ICD-10-CM | POA: Diagnosis not present

## 2016-09-06 DIAGNOSIS — C3430 Malignant neoplasm of lower lobe, unspecified bronchus or lung: Secondary | ICD-10-CM

## 2016-09-06 LAB — COMPREHENSIVE METABOLIC PANEL
ALBUMIN: 2.8 g/dL — AB (ref 3.5–5.0)
ALK PHOS: 67 U/L (ref 40–150)
ALT: 9 U/L (ref 0–55)
ANION GAP: 10 meq/L (ref 3–11)
AST: 11 U/L (ref 5–34)
BILIRUBIN TOTAL: 0.41 mg/dL (ref 0.20–1.20)
BUN: 14.4 mg/dL (ref 7.0–26.0)
CO2: 22 meq/L (ref 22–29)
CREATININE: 1.2 mg/dL (ref 0.7–1.3)
Calcium: 9.2 mg/dL (ref 8.4–10.4)
Chloride: 107 mEq/L (ref 98–109)
EGFR: 65 mL/min/{1.73_m2} — ABNORMAL LOW (ref >90)
GLUCOSE: 171 mg/dL — AB (ref 70–140)
Potassium: 3.8 mEq/L (ref 3.5–5.1)
SODIUM: 139 meq/L (ref 136–145)
TOTAL PROTEIN: 6.9 g/dL (ref 6.4–8.3)

## 2016-09-06 LAB — CBC WITH DIFFERENTIAL/PLATELET
BASO%: 0.6 % (ref 0.0–2.0)
Basophils Absolute: 0 10*3/uL (ref 0.0–0.1)
EOS ABS: 0.3 10*3/uL (ref 0.0–0.5)
EOS%: 5 % (ref 0.0–7.0)
HCT: 34.8 % — ABNORMAL LOW (ref 38.4–49.9)
HEMOGLOBIN: 12 g/dL — AB (ref 13.0–17.1)
LYMPH#: 0.8 10*3/uL — AB (ref 0.9–3.3)
LYMPH%: 15.7 % (ref 14.0–49.0)
MCH: 30.8 pg (ref 27.2–33.4)
MCHC: 34.5 g/dL (ref 32.0–36.0)
MCV: 89.5 fL (ref 79.3–98.0)
MONO#: 0.3 10*3/uL (ref 0.1–0.9)
MONO%: 6.9 % (ref 0.0–14.0)
NEUT%: 71.8 % (ref 39.0–75.0)
NEUTROS ABS: 3.6 10*3/uL (ref 1.5–6.5)
PLATELETS: 151 10*3/uL (ref 140–400)
RBC: 3.89 10*6/uL — AB (ref 4.20–5.82)
RDW: 13.8 % (ref 11.0–14.6)
WBC: 5 10*3/uL (ref 4.0–10.3)

## 2016-09-06 MED ORDER — SODIUM CHLORIDE 0.9 % IV SOLN
Freq: Once | INTRAVENOUS | Status: AC
  Administered 2016-09-06: 11:00:00 via INTRAVENOUS

## 2016-09-06 MED ORDER — SODIUM CHLORIDE 0.9 % IV SOLN
240.0000 mg | Freq: Once | INTRAVENOUS | Status: AC
  Administered 2016-09-06: 240 mg via INTRAVENOUS
  Filled 2016-09-06: qty 4

## 2016-09-06 NOTE — Patient Instructions (Signed)
[facility] Discharge Instructions for Patients Receiving Chemotherapy  Today you received the following chemotherapy agents:  Opdivo  To help prevent nausea and vomiting after your treatment, we encourage you to take your nausea medication as prescribed.   If you develop nausea and vomiting that is not controlled by your nausea medication, call the clinic.   BELOW ARE SYMPTOMS THAT SHOULD BE REPORTED IMMEDIATELY:  *FEVER GREATER THAN 100.5 F  *CHILLS WITH OR WITHOUT FEVER  NAUSEA AND VOMITING THAT IS NOT CONTROLLED WITH YOUR NAUSEA MEDICATION  *UNUSUAL SHORTNESS OF BREATH  *UNUSUAL BRUISING OR BLEEDING  TENDERNESS IN MOUTH AND THROAT WITH OR WITHOUT PRESENCE OF ULCERS  *URINARY PROBLEMS  *BOWEL PROBLEMS  UNUSUAL RASH Items with * indicate a potential emergency and should be followed up as soon as possible.  Feel free to call the clinic you have any questions or concerns. The clinic phone number is [phone].  Please show the CHEMO ALERT CARD at check-in to the Emergency Department and triage nurse.   

## 2016-09-06 NOTE — Telephone Encounter (Signed)
Opened in error.......... See previous note.

## 2016-09-06 NOTE — Telephone Encounter (Signed)
Called and spoke with pt and he stated that he did the test last night and they will come by today to pick up the equipment. Nothing further is needed.

## 2016-09-06 NOTE — Progress Notes (Signed)
[facility] [phone]   [phone]  OFFICE PROGRESS NOTE  [name], MD [address] [place] [address]  DIAGNOSIS: Stage IV (T3, N2, M1 B) non-small cell lung cancer, adenocarcinoma with no actionable mutations presented with large left lower lobe mass in addition to bilateral pulmonary nodules, right hilar and subcarinal lymphadenopathy as well as suspicious bony lesions diagnosed in 2017.  PRIOR THERAPY: 1) Systemic chemotherapy with carboplatin for AUC of 5 and Alimta 500 MG/M2 every 3 weeks. First dose 12/29/2015. Status post 6 cycles. Last dose was given 04/19/2016 with stable disease. 2) Maintenance systemic chemotherapy with single agent Alimta 500 MG/M2 every 3 weeks. First dose 05/31/2016. Status post 2 cycles, last dose was given 06/21/2016 discontinued secondary to disease progression.  CURRENT THERAPY: Immunotherapy with Nivolumab 240 MG IV every 2 weeks, status post 3 cycles. First dose was given on 07/22/2016.  INTERVAL HISTORY: Matthew Berger 80 y.o. male returns to the clinic today for follow-up visit accompanied by his wife and son. The patient is doing fine today but he fell a few days ago at home after he moved quickly from sitting position. He was evaluated by EMS at home and no concerning findings were seen. He is feeling much better today. He denied having any significant chest pain, shortness of breath, cough or hemoptysis. He has no nausea or vomiting. He tolerated the last cycle of his systemic treatment with immunotherapy well. He is here today for evaluation before starting cycle #4.  MEDICAL HISTORY: Past Medical History:  Diagnosis Date  . Atrial fibrillation ([facility])   . CHF (congestive heart failure) ([facility])   . Encounter for antineoplastic chemotherapy 01/05/2016  . Encounter for antineoplastic immunotherapy 07/12/2016  . GERD (gastroesophageal reflux disease)   . Hypercholesteremia   . Hypertension   .  Non-small cell lung cancer (NSCLC) ([facility]) 11/19/15  . Pneumonia 11/18/2015    ALLERGIES:  has No Known Allergies.  MEDICATIONS:  Current Outpatient Prescriptions  Medication Sig Dispense Refill  . amiodarone (PACERONE) 200 MG tablet Take 1 tablet (200 mg total) by mouth daily. Please call cardiology Dr Einar Gip for refills. 30 tablet 0  . aspirin 81 MG tablet Take 81 mg by mouth daily.    Marland Kitchen atorvastatin (LIPITOR) 20 MG tablet Take 20 mg by mouth daily.    . Brinzolamide-Brimonidine (SIMBRINZA) 1-0.2 % SUSP Apply 2 drops to eye 2 (two) times daily.    Marland Kitchen diltiazem (CARDIZEM CD) 180 MG 24 hr capsule Take 1 capsule (180 mg total) by mouth 2 (two) times daily. (Patient taking differently: Take 180 mg by mouth daily. ) 60 capsule 0  . dorzolamide (TRUSOPT) 2 % ophthalmic solution Place 1 drop into the left eye 2 (two) times daily.    . folic acid (FOLVITE) 1 MG tablet TAKE 1 TABLET (1 MG TOTAL) BY MOUTH DAILY.  0  . latanoprost (XALATAN) 0.005 % ophthalmic solution Place 1 drop into both eyes at bedtime.    . metoprolol (LOPRESSOR) 50 MG tablet Take 1 tablet (50 mg total) by mouth 2 (two) times daily. 60 tablet 0  . prochlorperazine (COMPAZINE) 10 MG tablet TAKE 1 TABLET BY MOUTH EVERY 6 HOURS AS NEEDED FOR NAUSEA AND VOMITING (Patient not taking: Reported on 08/23/2016) 30 tablet 0  . tamsulosin (FLOMAX) 0.4 MG CAPS capsule Take 0.4 mg by mouth daily.    . vitamin B-12 (CYANOCOBALAMIN) 1000 MCG tablet Take 1,000 mcg by mouth daily.  No current facility-administered medications for this visit.     SURGICAL HISTORY:  Past Surgical History:  Procedure Laterality Date  . CATARACT EXTRACTION Bilateral   . ROTATOR CUFF REPAIR Right   . VIDEO BRONCHOSCOPY Bilateral 11/19/2015   Procedure: VIDEO BRONCHOSCOPY WITH FLUORO;  Surgeon: Rigoberto Noel, MD;  Location: [facility];  Service: Cardiopulmonary;  Laterality: Bilateral;  . WISDOM TOOTH EXTRACTION      REVIEW OF SYSTEMS:  A comprehensive review  of systems was negative except for: Constitutional: positive for fatigue   PHYSICAL EXAMINATION: General appearance: alert, cooperative, fatigued and no distress Head: Normocephalic, without obvious abnormality, atraumatic Neck: no adenopathy, no JVD, supple, symmetrical, trachea midline and thyroid not enlarged, symmetric, no tenderness/mass/nodules Lymph nodes: Cervical, supraclavicular, and axillary nodes normal. Resp: clear to auscultation bilaterally Back: symmetric, no curvature. ROM normal. No CVA tenderness. Cardio: regular rate and rhythm, S1, S2 normal, no murmur, click, rub or gallop GI: soft, non-tender; bowel sounds normal; no masses,  no organomegaly Extremities: extremities normal, atraumatic, no cyanosis or edema  ECOG PERFORMANCE STATUS: 1 - Symptomatic but completely ambulatory  Blood pressure 114/61, pulse 67, temperature 97.4 F (36.3 C), temperature source Oral, resp. rate 18, height '6\' 1"'$  (1.854 m), weight 233 lb 1.6 oz (105.7 kg), SpO2 96 %.  LABORATORY DATA: Lab Results  Component Value Date   WBC 5.0 09/06/2016   HGB 12.0 (L) 09/06/2016   HCT 34.8 (L) 09/06/2016   MCV 89.5 09/06/2016   PLT 151 09/06/2016      Chemistry      Component Value Date/Time   NA 142 08/23/2016 1152   K 3.6 08/23/2016 1152   CL 106 07/06/2016 0527   CO2 21 (L) 08/23/2016 1152   BUN 13.2 08/23/2016 1152   CREATININE 1.4 (H) 08/23/2016 1152      Component Value Date/Time   CALCIUM 9.3 08/23/2016 1152   ALKPHOS 63 08/23/2016 1152   AST 11 08/23/2016 1152   ALT 11 08/23/2016 1152   BILITOT 0.59 08/23/2016 1152       RADIOGRAPHIC STUDIES: No results found.  ASSESSMENT AND PLAN: This is a very pleasant 80 years old African-American male with a stage IV non-small cell lung cancer status post induction systemic chemotherapy with carboplatin and Alimta followed by dose of maintenance Alimta discontinued secondary to disease progression. He is currently undergoing systemic  treatment with immunotherapy with Nivolumab status post 3 cycles. He is tolerating this treatment much better with no concerning findings. I recommended for him to proceed with cycle #4 today as scheduled. He would come back for follow-up visit in 2 weeks for reevaluation with repeat CT scan of the chest, abdomen and pelvis for restaging of his disease. He was advised to call immediately if he has any concerning symptoms in the interval. The patient voices understanding of current disease status and treatment options and is in agreement with the current care plan.  All questions were answered. The patient knows to call the clinic with any problems, questions or concerns. We can certainly see the patient much sooner if necessary.  I spent 10 minutes counseling the patient face to face. The total time spent in the appointment was 15 minutes.  Disclaimer: This note was dictated with voice recognition software. Similar sounding words can inadvertently be transcribed and may not be corrected upon review.

## 2016-09-06 NOTE — Telephone Encounter (Signed)
Patient already on scheduled for lab/fu/tx q2w thru end of January. Gave patient avs report and appointments for December and January. Central radiology will call re scan.

## 2016-09-08 ENCOUNTER — Other Ambulatory Visit: Payer: Self-pay | Admitting: Internal Medicine

## 2016-09-15 ENCOUNTER — Ambulatory Visit (HOSPITAL_COMMUNITY)
Admission: RE | Admit: 2016-09-15 | Discharge: 2016-09-15 | Disposition: A | Discharge status: Home / Self Care | Payer: Medicare Other | Source: Ambulatory Visit | Attending: Internal Medicine | Admitting: Internal Medicine

## 2016-09-15 ENCOUNTER — Encounter (HOSPITAL_COMMUNITY): Payer: Self-pay

## 2016-09-15 DIAGNOSIS — M5136 Other intervertebral disc degeneration, lumbar region: Secondary | ICD-10-CM | POA: Insufficient documentation

## 2016-09-15 DIAGNOSIS — R918 Other nonspecific abnormal finding of lung field: Secondary | ICD-10-CM | POA: Diagnosis not present

## 2016-09-15 DIAGNOSIS — I2510 Atherosclerotic heart disease of native coronary artery without angina pectoris: Secondary | ICD-10-CM | POA: Insufficient documentation

## 2016-09-15 DIAGNOSIS — M47896 Other spondylosis, lumbar region: Secondary | ICD-10-CM | POA: Diagnosis not present

## 2016-09-15 DIAGNOSIS — C3491 Malignant neoplasm of unspecified part of right bronchus or lung: Secondary | ICD-10-CM | POA: Insufficient documentation

## 2016-09-15 DIAGNOSIS — N400 Enlarged prostate without lower urinary tract symptoms: Secondary | ICD-10-CM | POA: Insufficient documentation

## 2016-09-15 DIAGNOSIS — Z5112 Encounter for antineoplastic immunotherapy: Secondary | ICD-10-CM | POA: Diagnosis not present

## 2016-09-15 DIAGNOSIS — I700 Atherosclerosis of aorta: Secondary | ICD-10-CM | POA: Diagnosis not present

## 2016-09-15 DIAGNOSIS — R938 Abnormal findings on diagnostic imaging of other specified body structures: Secondary | ICD-10-CM | POA: Diagnosis not present

## 2016-09-15 MED ORDER — IOPAMIDOL (ISOVUE-300) INJECTION 61%
INTRAVENOUS | Status: AC
  Administered 2016-09-15: 100 mL
  Filled 2016-09-15: qty 100

## 2016-09-15 MED ORDER — SODIUM CHLORIDE 0.9 % IJ SOLN
INTRAMUSCULAR | Status: AC
  Filled 2016-09-15: qty 50

## 2016-09-19 ENCOUNTER — Telehealth: Payer: Self-pay | Admitting: Pulmonary Disease

## 2016-09-19 NOTE — Telephone Encounter (Signed)
OVERNIGHT OXIMETRY 09/05/16:  On room air. 101.9 minutes with saturation </= 88%. Awake saturation 87%.

## 2016-09-20 ENCOUNTER — Telehealth: Payer: Self-pay

## 2016-09-20 ENCOUNTER — Encounter: Payer: Self-pay | Admitting: Internal Medicine

## 2016-09-20 ENCOUNTER — Other Ambulatory Visit (HOSPITAL_BASED_OUTPATIENT_CLINIC_OR_DEPARTMENT_OTHER): Payer: Medicare Other

## 2016-09-20 ENCOUNTER — Ambulatory Visit (HOSPITAL_BASED_OUTPATIENT_CLINIC_OR_DEPARTMENT_OTHER): Payer: Medicare Other

## 2016-09-20 ENCOUNTER — Telehealth: Payer: Self-pay | Admitting: Internal Medicine

## 2016-09-20 ENCOUNTER — Ambulatory Visit (HOSPITAL_BASED_OUTPATIENT_CLINIC_OR_DEPARTMENT_OTHER): Payer: Medicare Other | Admitting: Internal Medicine

## 2016-09-20 VITALS — BP 110/64 | HR 71 | Temp 98.2°F | Resp 18 | Ht 73.0 in | Wt 232.6 lb

## 2016-09-20 DIAGNOSIS — I4891 Unspecified atrial fibrillation: Secondary | ICD-10-CM

## 2016-09-20 DIAGNOSIS — Z5112 Encounter for antineoplastic immunotherapy: Secondary | ICD-10-CM

## 2016-09-20 DIAGNOSIS — C3491 Malignant neoplasm of unspecified part of right bronchus or lung: Secondary | ICD-10-CM

## 2016-09-20 DIAGNOSIS — C3432 Malignant neoplasm of lower lobe, left bronchus or lung: Secondary | ICD-10-CM | POA: Diagnosis not present

## 2016-09-20 DIAGNOSIS — R0902 Hypoxemia: Secondary | ICD-10-CM

## 2016-09-20 DIAGNOSIS — I10 Essential (primary) hypertension: Secondary | ICD-10-CM | POA: Diagnosis not present

## 2016-09-20 LAB — CBC WITH DIFFERENTIAL/PLATELET
BASO%: 1.1 % (ref 0.0–2.0)
Basophils Absolute: 0.1 10*3/uL (ref 0.0–0.1)
EOS%: 6.1 % (ref 0.0–7.0)
Eosinophils Absolute: 0.3 10*3/uL (ref 0.0–0.5)
HEMATOCRIT: 36.9 % — AB (ref 38.4–49.9)
HEMOGLOBIN: 12.1 g/dL — AB (ref 13.0–17.1)
LYMPH#: 0.8 10*3/uL — AB (ref 0.9–3.3)
LYMPH%: 18 % (ref 14.0–49.0)
MCH: 30.4 pg (ref 27.2–33.4)
MCHC: 32.9 g/dL (ref 32.0–36.0)
MCV: 92.4 fL (ref 79.3–98.0)
MONO#: 0.4 10*3/uL (ref 0.1–0.9)
MONO%: 8.2 % (ref 0.0–14.0)
NEUT%: 66.6 % (ref 39.0–75.0)
NEUTROS ABS: 3 10*3/uL (ref 1.5–6.5)
Platelets: 147 10*3/uL (ref 140–400)
RBC: 3.99 10*6/uL — ABNORMAL LOW (ref 4.20–5.82)
RDW: 14.9 % — AB (ref 11.0–14.6)
WBC: 4.5 10*3/uL (ref 4.0–10.3)

## 2016-09-20 LAB — COMPREHENSIVE METABOLIC PANEL
ALT: 10 U/L (ref 0–55)
AST: 9 U/L (ref 5–34)
Albumin: 3.1 g/dL — ABNORMAL LOW (ref 3.5–5.0)
Alkaline Phosphatase: 65 U/L (ref 40–150)
Anion Gap: 10 mEq/L (ref 3–11)
BILIRUBIN TOTAL: 0.46 mg/dL (ref 0.20–1.20)
BUN: 13.9 mg/dL (ref 7.0–26.0)
CALCIUM: 9.3 mg/dL (ref 8.4–10.4)
CHLORIDE: 108 meq/L (ref 98–109)
CO2: 20 mEq/L — ABNORMAL LOW (ref 22–29)
CREATININE: 1.3 mg/dL (ref 0.7–1.3)
EGFR: 62 mL/min/{1.73_m2} — ABNORMAL LOW (ref >90)
Glucose: 149 mg/dl — ABNORMAL HIGH (ref 70–140)
Potassium: 3.6 mEq/L (ref 3.5–5.1)
Sodium: 139 mEq/L (ref 136–145)
TOTAL PROTEIN: 7.1 g/dL (ref 6.4–8.3)

## 2016-09-20 MED ORDER — HEPARIN SOD (PORK) LOCK FLUSH 100 UNIT/ML IV SOLN
500.0000 [IU] | Freq: Once | INTRAVENOUS | Status: DC | PRN
  Filled 2016-09-20: qty 5

## 2016-09-20 MED ORDER — SODIUM CHLORIDE 0.9 % IV SOLN
Freq: Once | INTRAVENOUS | Status: AC
  Administered 2016-09-20: 11:00:00 via INTRAVENOUS

## 2016-09-20 MED ORDER — SODIUM CHLORIDE 0.9 % IV SOLN
240.0000 mg | Freq: Once | INTRAVENOUS | Status: AC
  Administered 2016-09-20: 240 mg via INTRAVENOUS
  Filled 2016-09-20: qty 20

## 2016-09-20 MED ORDER — SODIUM CHLORIDE 0.9% FLUSH
10.0000 mL | INTRAVENOUS | Status: DC | PRN
  Filled 2016-09-20: qty 10

## 2016-09-20 NOTE — Telephone Encounter (Signed)
Spoke with pt, aware of results/recs.  Order placed.  Nothing further needed.

## 2016-09-20 NOTE — Telephone Encounter (Signed)
-----   Message from Javier Glazier, MD sent at 09/19/2016  1:24 PM EST ----- Patient's overnight pulse ox definitely shows desaturation. Please repeat an overnight pulse oximetry on 2 L/m to see if his oxygenation improves. He already has oxygen at home set up.Thanks.

## 2016-09-20 NOTE — Telephone Encounter (Signed)
Patient already on schedule q2w thru end of February. Gave patient avs report and appointments for January

## 2016-09-20 NOTE — Progress Notes (Signed)
[facility] [phone]   [phone]  OFFICE PROGRESS NOTE  [name], MD [address] [place] [address]  DIAGNOSIS: Stage IV (T3, N2, M1b) non-small cell lung cancer, adenocarcinoma with no unacceptable mutations diagnosed in February 2017 and presented with large left lower lobe lung mass, bilateral pulmonary nodules, right hilar and subcarinal lymphadenopathy as well as suspicious bone lesions.  PRIOR THERAPY: 1) Systemic chemotherapy with carboplatin for AUC of 5 and Alimta 500 MG/M2 every 3 weeks. First dose 12/29/2015. Status post 6 cycles. Last dose was given 04/19/2016 with stable disease. 2) Maintenance systemic chemotherapy with single agent Alimta 500 MG/M2 every 3 weeks. First dose 05/31/2016. Status post 2 cycles, last dose was given 06/21/2016 discontinued secondary to disease progression.  CURRENT THERAPY: immunotherapy with Nivolumab 240 mg IV every 2 weeks status post 4 cycles. First dose was given 07/22/2016.   INTERVAL HISTORY: Matthew Berger 80 y.o. male returns to the clinic today for follow-up visit accompanied by his wife. The patient is currently undergoing treatment with immunotherapy with Nivolumab status post 4 cycles and tolerating the treatment well. He denied having any significant chest pain but continues to have shortness of breath with exertion with no cough or hemoptysis. He has no fever or chills. He has no nausea, vomiting, diarrhea or constipation. He denied having any significant weight loss or night sweats. He had repeat CT scan of the chest performed recently and he is here for evaluation and discussion of his scan results.  MEDICAL HISTORY: Past Medical History:  Diagnosis Date  . Atrial fibrillation ([facility])   . CHF (congestive heart failure) ([facility])   . Encounter for antineoplastic chemotherapy 01/05/2016  . Encounter for antineoplastic immunotherapy 07/12/2016  . GERD (gastroesophageal reflux disease)     . Hypercholesteremia   . Hypertension   . Non-small cell lung cancer (NSCLC) ([facility]) 11/19/15  . Pneumonia 11/18/2015    ALLERGIES:  has No Known Allergies.  MEDICATIONS:  Current Outpatient Prescriptions  Medication Sig Dispense Refill  . aspirin 81 MG tablet Take 81 mg by mouth daily.    Marland Kitchen atorvastatin (LIPITOR) 20 MG tablet Take 20 mg by mouth daily.    . Brinzolamide-Brimonidine (SIMBRINZA) 1-0.2 % SUSP Apply 2 drops to eye 2 (two) times daily.    Marland Kitchen diltiazem (CARDIZEM CD) 180 MG 24 hr capsule Take 1 capsule (180 mg total) by mouth 2 (two) times daily. (Patient taking differently: Take 180 mg by mouth daily. ) 60 capsule 0  . dorzolamide (TRUSOPT) 2 % ophthalmic solution Place 1 drop into the left eye 2 (two) times daily.    . folic acid (FOLVITE) 1 MG tablet TAKE 1 TABLET (1 MG TOTAL) BY MOUTH DAILY.  0  . latanoprost (XALATAN) 0.005 % ophthalmic solution Place 1 drop into both eyes at bedtime.    . metoprolol (LOPRESSOR) 50 MG tablet Take 1 tablet (50 mg total) by mouth 2 (two) times daily. 60 tablet 0  . tamsulosin (FLOMAX) 0.4 MG CAPS capsule Take 0.4 mg by mouth daily.    . vitamin B-12 (CYANOCOBALAMIN) 1000 MCG tablet Take 1,000 mcg by mouth daily.    Marland Kitchen amiodarone (PACERONE) 200 MG tablet Take 1 tablet (200 mg total) by mouth daily. Please call cardiology Dr Einar Gip for refills. 30 tablet 0  . prochlorperazine (COMPAZINE) 10 MG tablet TAKE 1 TABLET BY MOUTH EVERY 6 HOURS AS NEEDED FOR NAUSEA AND VOMITING (Patient not taking: Reported on 09/20/2016) 30 tablet 0  No current facility-administered medications for this visit.     SURGICAL HISTORY:  Past Surgical History:  Procedure Laterality Date  . CATARACT EXTRACTION Bilateral   . ROTATOR CUFF REPAIR Right   . VIDEO BRONCHOSCOPY Bilateral 11/19/2015   Procedure: VIDEO BRONCHOSCOPY WITH FLUORO;  Surgeon: Rigoberto Noel, MD;  Location: [facility];  Service: Cardiopulmonary;  Laterality: Bilateral;  . WISDOM TOOTH EXTRACTION       REVIEW OF SYSTEMS:  Constitutional: positive for fatigue Eyes: negative Ears, nose, mouth, throat, and face: negative Respiratory: positive for dyspnea on exertion Cardiovascular: negative Gastrointestinal: negative Genitourinary:negative Integument/breast: negative Hematologic/lymphatic: negative Musculoskeletal:negative Neurological: negative Behavioral/Psych: negative Endocrine: negative Allergic/Immunologic: negative   PHYSICAL EXAMINATION: General appearance: alert, cooperative, fatigued and no distress Head: Normocephalic, without obvious abnormality, atraumatic Neck: no adenopathy, no JVD, supple, symmetrical, trachea midline and thyroid not enlarged, symmetric, no tenderness/mass/nodules Lymph nodes: Cervical, supraclavicular, and axillary nodes normal. Resp: clear to auscultation bilaterally Back: symmetric, no curvature. ROM normal. No CVA tenderness. Cardio: regular rate and rhythm, S1, S2 normal, no murmur, click, rub or gallop GI: soft, non-tender; bowel sounds normal; no masses,  no organomegaly Extremities: extremities normal, atraumatic, no cyanosis or edema Neurologic: Alert and oriented X 3, normal strength and tone. Normal symmetric reflexes. Normal coordination and gait  ECOG PERFORMANCE STATUS: 1 - Symptomatic but completely ambulatory  Blood pressure 110/64, pulse 71, temperature 98.2 F (36.8 C), temperature source Oral, resp. rate 18, height '6\' 1"'$  (1.854 m), weight 232 lb 9.6 oz (105.5 kg), SpO2 100 %.  LABORATORY DATA: Lab Results  Component Value Date   WBC 4.5 09/20/2016   HGB 12.1 (L) 09/20/2016   HCT 36.9 (L) 09/20/2016   MCV 92.4 09/20/2016   PLT 147 09/20/2016      Chemistry      Component Value Date/Time   NA 139 09/20/2016 0920   K 3.6 09/20/2016 0920   CL 106 07/06/2016 0527   CO2 20 (L) 09/20/2016 0920   BUN 13.9 09/20/2016 0920   CREATININE 1.3 09/20/2016 0920      Component Value Date/Time   CALCIUM 9.3 09/20/2016 0920    ALKPHOS 65 09/20/2016 0920   AST 9 09/20/2016 0920   ALT 10 09/20/2016 0920   BILITOT 0.46 09/20/2016 0920       RADIOGRAPHIC STUDIES: Ct Chest W Contrast  Result Date: 09/15/2016 CLINICAL DATA:  Restaging of right non-small cell lung cancer EXAM: CT CHEST, ABDOMEN, AND PELVIS WITH CONTRAST TECHNIQUE: Multidetector CT imaging of the chest, abdomen and pelvis was performed following the standard protocol during bolus administration of intravenous contrast. CONTRAST:  1 ISOVUE-300 IOPAMIDOL (ISOVUE-300) INJECTION 61% COMPARISON:  Multiple exams, including 05/22/2016 FINDINGS: CT CHEST FINDINGS Cardiovascular: Coronary, aortic arch, and branch vessel atherosclerotic vascular disease. Mediastinum/Nodes: Right hilar node 1 cm in short axis on image 26/2, borderline prominent. Lungs/Pleura: Scattered numerous bilateral primarily sub solid oval-shaped pulmonary nodules, confluent in some regions including the lingula and left lower lobe into larger conglomerate shins. There is some progressive airspace opacity in the left lower lobe. An index sub solid nodule measures 2.0 by 2.0 cm on image 51/4, formerly 1.7 by 1.4 cm. The more solid-appearing nodule in the left lower lobe measures 2.0 by 1.9 cm on image 93/4, previously 1.5 by 1.1 cm. There is more dense consolidation in the superior segment left lower lobe than there was previously. Musculoskeletal: Generalized sclerosis in the T10 vertebral body, also extending in the spinous process which appears expanded, and in the remaining  posterior elements. CT ABDOMEN PELVIS FINDINGS Hepatobiliary: The liver is imaged prior 2 hepatic vein and portal vein opacification. Gallbladder unremarkable. No biliary dilatation. Pancreas: Unremarkable Spleen: Unremarkable Adrenals/Urinary Tract: Right mid kidney cyst. No stones identified. Cortical thinning suggesting renal atrophy. Hypodense 9 mm by 6 mm lesion in the left mid kidney on image 29/7, stable and likely a cyst but  technically nonspecific. Stomach/Bowel: Unremarkable. Vascular/Lymphatic: Aortoiliac atherosclerotic vascular disease. No adenopathy identified. Reproductive: Prominently enlarged prostate gland, measuring 7.3 by 5.6 by 6.4 cm (volume = 140 cm^3). Slightly high density of the seminal vesicles on both sides could reflect proteinaceous or complex fluid. Other: No supplemental non-categorized findings. Musculoskeletal: Diffuse vertebral sclerosis at L2. Degenerative endplate disease at Q1-1 and L5-S1 with spondylosis and degenerative disc disease, with resulting impingement at the L3-4, L4-5, and L5-S1 levels. Transitional S1 vertebra noted. Small lipoma in the left gluteus maximus muscle. IMPRESSION: 1. Progressive primarily sub solid oval-shaped nodularity throughout both lungs, although with some solid component some with some progressive airspace opacity in the left lower lobe. Appearance concerning for progressive multifocal malignancy in the chest. 2. Sclerotic replacement of much of T10 and L2 vertebral levels, suspicious for metastatic disease. The S1 vertebra is transitional. 3. Other imaging findings of potential clinical significance: Coronary, aortic arch, and branch vessel atherosclerotic vascular disease. Aortoiliac atherosclerotic vascular disease. Enlarged prostate gland. Lower lumbar spondylosis and degenerative disc disease causing multilevel impingement. Electronically Signed   By: Van Clines M.D.   On: 09/15/2016 15:42   Ct Abdomen Pelvis W Contrast  Result Date: 09/15/2016 CLINICAL DATA:  Restaging of right non-small cell lung cancer EXAM: CT CHEST, ABDOMEN, AND PELVIS WITH CONTRAST TECHNIQUE: Multidetector CT imaging of the chest, abdomen and pelvis was performed following the standard protocol during bolus administration of intravenous contrast. CONTRAST:  1 ISOVUE-300 IOPAMIDOL (ISOVUE-300) INJECTION 61% COMPARISON:  Multiple exams, including 05/22/2016 FINDINGS: CT CHEST FINDINGS  Cardiovascular: Coronary, aortic arch, and branch vessel atherosclerotic vascular disease. Mediastinum/Nodes: Right hilar node 1 cm in short axis on image 26/2, borderline prominent. Lungs/Pleura: Scattered numerous bilateral primarily sub solid oval-shaped pulmonary nodules, confluent in some regions including the lingula and left lower lobe into larger conglomerate shins. There is some progressive airspace opacity in the left lower lobe. An index sub solid nodule measures 2.0 by 2.0 cm on image 51/4, formerly 1.7 by 1.4 cm. The more solid-appearing nodule in the left lower lobe measures 2.0 by 1.9 cm on image 93/4, previously 1.5 by 1.1 cm. There is more dense consolidation in the superior segment left lower lobe than there was previously. Musculoskeletal: Generalized sclerosis in the T10 vertebral body, also extending in the spinous process which appears expanded, and in the remaining posterior elements. CT ABDOMEN PELVIS FINDINGS Hepatobiliary: The liver is imaged prior 2 hepatic vein and portal vein opacification. Gallbladder unremarkable. No biliary dilatation. Pancreas: Unremarkable Spleen: Unremarkable Adrenals/Urinary Tract: Right mid kidney cyst. No stones identified. Cortical thinning suggesting renal atrophy. Hypodense 9 mm by 6 mm lesion in the left mid kidney on image 29/7, stable and likely a cyst but technically nonspecific. Stomach/Bowel: Unremarkable. Vascular/Lymphatic: Aortoiliac atherosclerotic vascular disease. No adenopathy identified. Reproductive: Prominently enlarged prostate gland, measuring 7.3 by 5.6 by 6.4 cm (volume = 140 cm^3). Slightly high density of the seminal vesicles on both sides could reflect proteinaceous or complex fluid. Other: No supplemental non-categorized findings. Musculoskeletal: Diffuse vertebral sclerosis at L2. Degenerative endplate disease at H4-1 and L5-S1 with spondylosis and degenerative disc disease, with resulting impingement at  the L3-4, L4-5, and L5-S1  levels. Transitional S1 vertebra noted. Small lipoma in the left gluteus maximus muscle. IMPRESSION: 1. Progressive primarily sub solid oval-shaped nodularity throughout both lungs, although with some solid component some with some progressive airspace opacity in the left lower lobe. Appearance concerning for progressive multifocal malignancy in the chest. 2. Sclerotic replacement of much of T10 and L2 vertebral levels, suspicious for metastatic disease. The S1 vertebra is transitional. 3. Other imaging findings of potential clinical significance: Coronary, aortic arch, and branch vessel atherosclerotic vascular disease. Aortoiliac atherosclerotic vascular disease. Enlarged prostate gland. Lower lumbar spondylosis and degenerative disc disease causing multilevel impingement. Electronically Signed   By: Van Clines M.D.   On: 09/15/2016 15:42    ASSESSMENT AND PLAN: This is a very pleasant 80 years old African-American male with: 1) metastatic non-small cell lung cancer, adenocarcinoma status post induction systemic chemotherapy with carboplatin and Alimta followed by few doses of maintenance Alimta discontinued secondary to intolerance and progression. He is currently undergoing treatment with immunotherapy with Nivolumab status post 4 cycles. He is tolerating his treatment with Nivolumab fairly well. He had repeat CT scan of the chest, abdomen and pelvis recently. I personally and independently reviewed the scan images and discuss the results with the patient and his wife. There is some mild disease progression in the pulmonary nodules but this could be secondary to pseudoprogression. I had a lengthy discussion with the patient and his wife today about his condition. I recommended for him to continue with 4 more cycles of immunotherapy with Nivolumab. He will proceed with cycle #5 today. I will continue to monitor the pulmonary nodules closely on the upcoming scan for any further progression. The  patient and his wife agreed to the current plan. 2) hypertension: The patient will continue on Cardizem and metoprolol. 3) anemia of neoplastic disease: His hemoglobin and hematocrit are stable. We'll continue to monitor for now. The patient was advised to call immediately if he has any concerning symptoms in the interval. The patient voices understanding of current disease status and treatment options and is in agreement with the current care plan.  All questions were answered. The patient knows to call the clinic with any problems, questions or concerns. We can certainly see the patient much sooner if necessary.  Disclaimer: This note was dictated with voice recognition software. Similar sounding words can inadvertently be transcribed and may not be corrected upon review.

## 2016-09-20 NOTE — Patient Instructions (Signed)
[facility] Discharge Instructions for Patients Receiving Chemotherapy  Today you received the following chemotherapy agents:  Opdivo  To help prevent nausea and vomiting after your treatment, we encourage you to take your nausea medication as prescribed.   If you develop nausea and vomiting that is not controlled by your nausea medication, call the clinic.   BELOW ARE SYMPTOMS THAT SHOULD BE REPORTED IMMEDIATELY:  *FEVER GREATER THAN 100.5 F  *CHILLS WITH OR WITHOUT FEVER  NAUSEA AND VOMITING THAT IS NOT CONTROLLED WITH YOUR NAUSEA MEDICATION  *UNUSUAL SHORTNESS OF BREATH  *UNUSUAL BRUISING OR BLEEDING  TENDERNESS IN MOUTH AND THROAT WITH OR WITHOUT PRESENCE OF ULCERS  *URINARY PROBLEMS  *BOWEL PROBLEMS  UNUSUAL RASH Items with * indicate a potential emergency and should be followed up as soon as possible.  Feel free to call the clinic you have any questions or concerns. The clinic phone number is [phone].  Please show the CHEMO ALERT CARD at check-in to the Emergency Department and triage nurse.   

## 2016-10-04 ENCOUNTER — Ambulatory Visit (HOSPITAL_BASED_OUTPATIENT_CLINIC_OR_DEPARTMENT_OTHER): Payer: Medicare Other | Admitting: Nurse Practitioner

## 2016-10-04 ENCOUNTER — Other Ambulatory Visit (HOSPITAL_BASED_OUTPATIENT_CLINIC_OR_DEPARTMENT_OTHER): Payer: Medicare Other

## 2016-10-04 ENCOUNTER — Ambulatory Visit (HOSPITAL_BASED_OUTPATIENT_CLINIC_OR_DEPARTMENT_OTHER): Payer: Medicare Other

## 2016-10-04 VITALS — BP 97/46 | HR 62

## 2016-10-04 VITALS — BP 89/53 | HR 61 | Temp 97.6°F | Resp 17 | Ht 73.0 in | Wt 228.6 lb

## 2016-10-04 DIAGNOSIS — Z5112 Encounter for antineoplastic immunotherapy: Secondary | ICD-10-CM

## 2016-10-04 DIAGNOSIS — I951 Orthostatic hypotension: Secondary | ICD-10-CM | POA: Diagnosis not present

## 2016-10-04 DIAGNOSIS — R000 Tachycardia, unspecified: Secondary | ICD-10-CM

## 2016-10-04 DIAGNOSIS — C3491 Malignant neoplasm of unspecified part of right bronchus or lung: Secondary | ICD-10-CM

## 2016-10-04 DIAGNOSIS — C3432 Malignant neoplasm of lower lobe, left bronchus or lung: Secondary | ICD-10-CM

## 2016-10-04 LAB — CBC WITH DIFFERENTIAL/PLATELET
BASO%: 0.5 % (ref 0.0–2.0)
BASOS ABS: 0 10*3/uL (ref 0.0–0.1)
EOS ABS: 0.3 10*3/uL (ref 0.0–0.5)
EOS%: 6.6 % (ref 0.0–7.0)
HCT: 35.5 % — ABNORMAL LOW (ref 38.4–49.9)
HEMOGLOBIN: 12 g/dL — AB (ref 13.0–17.1)
LYMPH%: 25.6 % (ref 14.0–49.0)
MCH: 30.2 pg (ref 27.2–33.4)
MCHC: 33.8 g/dL (ref 32.0–36.0)
MCV: 89.2 fL (ref 79.3–98.0)
MONO#: 0.4 10*3/uL (ref 0.1–0.9)
MONO%: 9.4 % (ref 0.0–14.0)
NEUT#: 2.3 10*3/uL (ref 1.5–6.5)
NEUT%: 57.9 % (ref 39.0–75.0)
Platelets: 146 10*3/uL (ref 140–400)
RBC: 3.98 10*6/uL — ABNORMAL LOW (ref 4.20–5.82)
RDW: 13.8 % (ref 11.0–14.6)
WBC: 3.9 10*3/uL — ABNORMAL LOW (ref 4.0–10.3)
lymph#: 1 10*3/uL (ref 0.9–3.3)

## 2016-10-04 LAB — COMPREHENSIVE METABOLIC PANEL
ALBUMIN: 3.2 g/dL — AB (ref 3.5–5.0)
ALK PHOS: 62 U/L (ref 40–150)
ALT: <6 U/L (ref 0–55)
AST: 8 U/L (ref 5–34)
Anion Gap: 12 mEq/L — ABNORMAL HIGH (ref 3–11)
BUN: 13.7 mg/dL (ref 7.0–26.0)
CALCIUM: 9.2 mg/dL (ref 8.4–10.4)
CHLORIDE: 108 meq/L (ref 98–109)
CO2: 19 mEq/L — ABNORMAL LOW (ref 22–29)
Creatinine: 1.5 mg/dL — ABNORMAL HIGH (ref 0.7–1.3)
EGFR: 51 mL/min/{1.73_m2} — AB (ref >90)
GLUCOSE: 155 mg/dL — AB (ref 70–140)
Potassium: 3.7 mEq/L (ref 3.5–5.1)
SODIUM: 140 meq/L (ref 136–145)
Total Bilirubin: 0.51 mg/dL (ref 0.20–1.20)
Total Protein: 7 g/dL (ref 6.4–8.3)

## 2016-10-04 MED ORDER — SODIUM CHLORIDE 0.9 % IV SOLN
INTRAVENOUS | Status: AC
  Administered 2016-10-04: 12:00:00 via INTRAVENOUS

## 2016-10-04 MED ORDER — SODIUM CHLORIDE 0.9 % IV SOLN: Freq: Once | INTRAVENOUS | Status: DC

## 2016-10-04 MED ORDER — SODIUM CHLORIDE 0.9 % IV SOLN
240.0000 mg | Freq: Once | INTRAVENOUS | Status: AC
  Administered 2016-10-04: 240 mg via INTRAVENOUS
  Filled 2016-10-04: qty 20

## 2016-10-04 NOTE — Progress Notes (Signed)
Pt received 535m of saline for symptoms of dizziness and orthostatic hypotension. Rechecked bp (see flowsheet) and pt did have some improvement with symptoms, but still reports upper body shakiness. Pt to see cardiologist today at 345pm to discuss bp medication and current symptoms pt have been experiencing for the past few weeks. Per Dr.Mohamed, ok to proceed with nivolumab treatment today. Pt felt fine after infusion. Discharge in no acute distress.

## 2016-10-04 NOTE — Progress Notes (Addendum)
Matthew Berger OFFICE PROGRESS NOTE   DIAGNOSIS: Stage IV (T3, N2, M1b) non-small cell lung cancer, adenocarcinoma with no unacceptable mutations diagnosed in February 2017 and presented with large left lower lobe lung mass, bilateral pulmonary nodules, right hilar and subcarinal lymphadenopathy as well as suspicious bone lesions.  PRIOR THERAPY: 1) Systemic chemotherapy with carboplatin for AUC of 5 and Alimta 500 MG/M2 every 3 weeks. First dose 12/29/2015. Status post 6 cycles. Last dose was given 04/19/2016 with stable disease. 2) Maintenance systemic chemotherapy with single agent Alimta 500 MG/M2 every 3 weeks. First dose 05/31/2016. Status post 2 cycles, last dose was given 06/21/2016 discontinued secondary to disease progression.  CURRENT THERAPY: Immunotherapy with Nivolumab 240 mg IV every 2 weeks status post 4 cycles. First dose was given 07/26/2016.     INTERVAL HISTORY:   Matthew Berger returns as scheduled. He completed cycle 5 nivolumab 09/20/2016. He had a single episode of emesis yesterday. Otherwise no nausea or vomiting. No mouth sores. No diarrhea. No rash. He has stable dyspnea on exertion. He reports a recent cough related to a "cold". No fever. He continues to have intermittent dizziness. He reports this has been occurring since his initial diagnosis of lung cancer. Recently he has noted dizziness mainly with position change.  Objective:  Vital signs in last 24 hours:  Blood pressure (!) 100/58, pulse 66, temperature 97.6 F (36.4 C), temperature source Oral, resp. rate 17, height '6\' 1"'$  (1.854 m), weight 228 lb 9.6 oz (103.7 kg), SpO2 96 %.    HEENT: No thrush or ulcers. Resp: Lungs clear bilaterally. Cardio: Regular rate and rhythm. GI: Abdomen soft and nontender. No hepatomegaly. Vascular: No leg edema. Neuro: Alert and oriented.    Lab Results:  Lab Results  Component Value Date   WBC 3.9 (L) 10/04/2016   HGB 12.0 (L) 10/04/2016   HCT 35.5 (L)  10/04/2016   MCV 89.2 10/04/2016   PLT 146 10/04/2016   NEUTROABS 2.3 10/04/2016    Imaging:  No results found.  Medications: I have reviewed the patient's current medications.  Assessment/Plan: 1. Metastatic non-small cell lung cancer status post carboplatin/Alimta 6 cycles 12/29/2015 through 04/19/2016 with stable disease; maintenance therapy with single agent Alimta beginning 05/31/2016 status post 2 cycles with subsequent discontinuation due to disease progression. Cycle 1 nivolumab 07/26/2016.   Disposition: Matthew Berger has completed 5 cycles of nivolumab. At today's visit he reports dizziness with position change. Vital signs confirmed he is orthostatic. He is on metoprolol and Cardizem for atrial tachycardia.  I reviewed the above with Dr. [name]. We will administer a normal saline 500 mL bolus with plans to proceed with cycle 6 nivolumab if the blood pressure improves. We contacted Dr. Irven Shelling office. They can see him later today to review and likely adjust his medications. In the meantime we discussed the importance of slow position change.  He will return for a follow-up visit in 2 weeks. He will contact the office in the interim with any problems.  Plan reviewed with Dr. [name]. 25 minutes were spent face-to-face at today's visit with the majority of that time involved in counseling/coordination of care.  Addendum-Matthew Berger was unable to make the appointment today with cardiology due to receiving IV fluids and nivolumab. The chemotherapy infusion nurse spoke with Dr. Irven Shelling office. They requested we refer him to his PCP for management of the blood pressure issues. He has an appointment to see Dr. Mellody Drown later today.  Ned Card ANP/GNP-BC   10/04/2016  10:56 AM

## 2016-10-04 NOTE — Patient Instructions (Signed)
Dehydration, Adult Dehydration is a condition in which there is not enough fluid or water in the body. This happens when you lose more fluids than you take in. Important organs, such as the kidneys, brain, and heart, cannot function without a proper amount of fluids. Any loss of fluids from the body can lead to dehydration. Dehydration can range from mild to severe. This condition should be treated right away to prevent it from becoming severe. What are the causes? This condition may be caused by:  Vomiting.  Diarrhea.  Excessive sweating, such as from heat exposure or exercise.  Not drinking enough fluid, especially:  When ill.  While doing activity that requires a lot of energy.  Excessive urination.  Fever.  Infection.  Certain medicines, such as medicines that cause the body to lose excess fluid (diuretics).  Inability to access safe drinking water.  Reduced physical ability to get adequate water and food. What increases the risk? This condition is more likely to develop in people:  Who have a poorly controlled long-term (chronic) illness, such as diabetes, heart disease, or kidney disease.  Who are age 65 or older.  Who are disabled.  Who live in a place with high altitude.  Who play endurance sports. What are the signs or symptoms? Symptoms of mild dehydration may include:   Thirst.  Dry lips.  Slightly dry mouth.  Dry, warm skin.  Dizziness. Symptoms of moderate dehydration may include:   Very dry mouth.  Muscle cramps.  Dark urine. Urine may be the color of tea.  Decreased urine production.  Decreased tear production.  Heartbeat that is irregular or faster than normal (palpitations).  Headache.  Light-headedness, especially when you stand up from a sitting position.  Fainting (syncope). Symptoms of severe dehydration may include:   Changes in skin, such as:  Cold and clammy skin.  Blotchy (mottled) or pale skin.  Skin that does  not quickly return to normal after being lightly pinched and released (poor skin turgor).  Changes in body fluids, such as:  Extreme thirst.  No tear production.  Inability to sweat when body temperature is high, such as in hot weather.  Very little urine production.  Changes in vital signs, such as:  Weak pulse.  Pulse that is more than 100 beats a minute when sitting still.  Rapid breathing.  Low blood pressure.  Other changes, such as:  Sunken eyes.  Cold hands and feet.  Confusion.  Lack of energy (lethargy).  Difficulty waking up from sleep.  Short-term weight loss.  Unconsciousness. How is this diagnosed? This condition is diagnosed based on your symptoms and a physical exam. Blood and urine tests may be done to help confirm the diagnosis. How is this treated? Treatment for this condition depends on the severity. Mild or moderate dehydration can often be treated at home. Treatment should be started right away. Do not wait until dehydration becomes severe. Severe dehydration is an emergency and it needs to be treated in a hospital. Treatment for mild dehydration may include:   Drinking more fluids.  Replacing salts and minerals in your blood (electrolytes) that you may have lost. Treatment for moderate dehydration may include:   Drinking an oral rehydration solution (ORS). This is a drink that helps you replace fluids and electrolytes (rehydrate). It can be found at pharmacies and retail stores. Treatment for severe dehydration may include:   Receiving fluids through an IV tube.  Receiving an electrolyte solution through a feeding tube that is   passed through your nose and into your stomach (nasogastric tube, or NG tube).  Correcting any abnormalities in electrolytes.  Treating the underlying cause of dehydration. Follow these instructions at home:  If directed by your health care provider, drink an ORS:  Make an ORS by following instructions on the  package.  Start by drinking small amounts, about  cup (120 mL) every 5-10 minutes.  Slowly increase how much you drink until you have taken the amount recommended by your health care provider.  Drink enough clear fluid to keep your urine clear or pale yellow. If you were told to drink an ORS, finish the ORS first, then start slowly drinking other clear fluids. Drink fluids such as:  Water. Do not drink only water. Doing that can lead to having too little salt (sodium) in the body (hyponatremia).  Ice chips.  Fruit juice that you have added water to (diluted fruit juice).  Low-calorie sports drinks.  Avoid:  Alcohol.  Drinks that contain a lot of sugar. These include high-calorie sports drinks, fruit juice that is not diluted, and soda.  Caffeine.  Foods that are greasy or contain a lot of fat or sugar.  Take over-the-counter and prescription medicines only as told by your health care provider.  Do not take sodium tablets. This can lead to having too much sodium in the body (hypernatremia).  Eat foods that contain a healthy balance of electrolytes, such as bananas, oranges, potatoes, tomatoes, and spinach.  Keep all follow-up visits as told by your health care provider. This is important. Contact a health care provider if:  You have abdominal pain that:  Gets worse.  Stays in one area (localizes).  You have a rash.  You have a stiff neck.  You are more irritable than usual.  You are sleepier or more difficult to wake up than usual.  You feel weak or dizzy.  You feel very thirsty.  You have urinated only a small amount of very dark urine over 6-8 hours. Get help right away if:  You have symptoms of severe dehydration.  You cannot drink fluids without vomiting.  Your symptoms get worse with treatment.  You have a fever.  You have a severe headache.  You have vomiting or diarrhea that:  Gets worse.  Does not go away.  You have blood or green matter  (bile) in your vomit.  You have blood in your stool. This may cause stool to look black and tarry.  You have not urinated in 6-8 hours.  You faint.  Your heart rate while sitting still is over 100 beats a minute.  You have trouble breathing. This information is not intended to replace advice given to you by your health care provider. Make sure you discuss any questions you have with your health care provider. Document Released: 09/18/2005 Document Revised: 04/14/2016 Document Reviewed: 11/12/2015 Elsevier Interactive Patient Education  2017 Elsevier Inc.  

## 2016-10-04 NOTE — Addendum Note (Signed)
Addended by: Ned Card K on: 10/04/2016 11:52 AM   Modules accepted: Orders

## 2016-10-05 ENCOUNTER — Encounter: Payer: Self-pay | Admitting: Pulmonary Disease

## 2016-10-05 ENCOUNTER — Ambulatory Visit (INDEPENDENT_AMBULATORY_CARE_PROVIDER_SITE_OTHER): Payer: Medicare Other | Admitting: Pulmonary Disease

## 2016-10-05 VITALS — BP 108/58 | HR 64 | Ht 74.0 in | Wt 233.0 lb

## 2016-10-05 DIAGNOSIS — G4734 Idiopathic sleep related nonobstructive alveolar hypoventilation: Secondary | ICD-10-CM

## 2016-10-05 DIAGNOSIS — C3490 Malignant neoplasm of unspecified part of unspecified bronchus or lung: Secondary | ICD-10-CM | POA: Diagnosis not present

## 2016-10-05 NOTE — Patient Instructions (Signed)
   Call me if you have any new breathing problems before your next appointment.  I will see you back after your tests have been completed in 8 weeks.  TESTS ORDERED: 1. Overnight oximetry on 2 L/m 2. Polysomnogram

## 2016-10-05 NOTE — Progress Notes (Signed)
Test reviewed.  

## 2016-10-05 NOTE — Progress Notes (Signed)
Subjective:    Patient ID: Matthew Berger, male    DOB: 03-20-1936, 81 y.o.   MRN: [id number]  C.C.:  Follow-up for Stage IV (T3, N2, M1b) NSCLC LLL & Nocturnal Hypoxia.  HPI Stage IV NSCLC LLL:  Adenocarcinoma on biopsy. Follows with medical oncology. Post 6 cycles of chemotherapy. Completed Nivolumab starting 07/26/16 after 5 cycles. Last seen 1/3 by Medical Oncology. Patient's CT imaging from 09/15/16 shows progression.   Nocturnal Hypoxia:  Patient did have significant desaturation during overnight oximetry in December. Repeat test on 2 L/m pending. Patient had no immediate desaturation or oxygen requirement during his 6 minute walk test today. He denies any morning headaches. He does nap frequently throughout the day. He isn't sure if he feels well rested. No one has witnessed any snoring or apneic spells. He denies any difficulty falling asleep. He reports he gets up 3-4 times nightly to use the bathroom. He generally goes to bed at 11pm & gets out of bed at 10:30am.   Review of Systems He reports he has had a mild cough that is nonproductive. Denies any fever or chills. No chest pain or pressure.   No Known Allergies  Current Outpatient Prescriptions on File Prior to Visit  Medication Sig Dispense Refill  . aspirin 81 MG tablet Take 81 mg by mouth daily.    . Brinzolamide-Brimonidine (SIMBRINZA) 1-0.2 % SUSP Apply 2 drops to eye 2 (two) times daily.    Marland Kitchen diltiazem (CARDIZEM CD) 180 MG 24 hr capsule Take 1 capsule (180 mg total) by mouth 2 (two) times daily. (Patient taking differently: Take 180 mg by mouth daily. ) 60 capsule 0  . dorzolamide (TRUSOPT) 2 % ophthalmic solution Place 1 drop into the left eye 2 (two) times daily.    . fluticasone (FLONASE) 50 MCG/ACT nasal spray Place 2 sprays into both nostrils daily.    . folic acid (FOLVITE) 1 MG tablet TAKE 1 TABLET (1 MG TOTAL) BY MOUTH DAILY.  0  . guaifenesin (ROBITUSSIN) 100 MG/5ML syrup Take 200 mg by mouth 3 (three) times  daily as needed for cough.    . latanoprost (XALATAN) 0.005 % ophthalmic solution Place 1 drop into both eyes at bedtime.    . metoprolol (LOPRESSOR) 50 MG tablet Take 1 tablet (50 mg total) by mouth 2 (two) times daily. 60 tablet 0  . prochlorperazine (COMPAZINE) 10 MG tablet TAKE 1 TABLET BY MOUTH EVERY 6 HOURS AS NEEDED FOR NAUSEA AND VOMITING 30 tablet 0  . tamsulosin (FLOMAX) 0.4 MG CAPS capsule Take 0.4 mg by mouth daily.    . vitamin B-12 (CYANOCOBALAMIN) 1000 MCG tablet Take 1,000 mcg by mouth daily.    Marland Kitchen amiodarone (PACERONE) 200 MG tablet Take 1 tablet (200 mg total) by mouth daily. Please call cardiology Dr Einar Gip for refills. 30 tablet 0   No current facility-administered medications on file prior to visit.     Past Medical History:  Diagnosis Date  . Atrial fibrillation ([place])   . CHF (congestive heart failure) ([place])   . Encounter for antineoplastic chemotherapy 01/05/2016  . Encounter for antineoplastic immunotherapy 07/12/2016  . GERD (gastroesophageal reflux disease)   . Hypercholesteremia   . Hypertension   . Non-small cell lung cancer (NSCLC) ([place]) 11/19/15  . Pneumonia 11/18/2015    Past Surgical History:  Procedure Laterality Date  . CATARACT EXTRACTION Bilateral   . ROTATOR CUFF REPAIR Right   . VIDEO BRONCHOSCOPY Bilateral 11/19/2015   Procedure: VIDEO BRONCHOSCOPY WITH FLUORO;  Surgeon: Rigoberto Noel, MD;  Location: [facility];  Service: Cardiopulmonary;  Laterality: Bilateral;  . WISDOM TOOTH EXTRACTION      Family History  Problem Relation Age of Onset  . Breast cancer Sister   . Stroke Sister   . Colon cancer Brother   . Cancer Maternal Aunt     x2  . Hypertension Mother   . Stroke Mother   . Breast cancer Cousin   . Diabetes Other   . Hypertension Other   . Hyperlipidemia Other     Social History   Social History  . Marital status: Married    Spouse name: N/A  . Number of children: Y8  . Years of education: N/A   Occupational History  .  retired Administrator    Social History Main Topics  . Smoking status: Former Smoker    Packs/day: 2.00    Years: 40.00    Types: Cigarettes    Quit date: 10/02/1990  . Smokeless tobacco: Never Used     Comment: smoked 1-2 ppd.   . Alcohol use No  . Drug use: No  . Sexual activity: Not Currently   Other Topics Concern  . None   Social History Narrative   Originally from [place]. Always lived in [place]. Previously has traveled to [place], [place], [place], [place], [place], [place], [place], & [place]. Previously worked driving garbage truck. No pets currently. No known mold exposure.       Objective:   Physical Exam BP (!) 108/58 (BP Location: Left Arm, Cuff Size: Normal)   Pulse 64   Ht '6\' 2"'$  (1.88 m)   Wt 233 lb (105.7 kg)   SpO2 91%   BMI 29.92 kg/m  General:  Awake. No distress. Mild central obesity. Integument:  Warm & dry. No rash on exposed skin.  Lymphatics:  No appreciated cervical or supraclavicular lymphadenoapthy. HEENT:  Moist mucus membranes. Mallampati Class 3. Neck circumference 17 inches. Cardiovascular:  Regular rate and rhythm. No edema. Normal S1 & S2.  Pulmonary:  Slightly bronchial breath sounds on the left. Normal work of breathing on room air. Speaking in complete sentences. Neurological:  CN 2-12 grossly in tact. No meningismus. Moving all extremities equally.   PFT 08/18/16: FVC 3.32 L (79%) FEV1 2.40 L (78%) FEV1/FVC 0.72 FEF 25-75 1.59 L (65%)                                                                                                                 DLCO corrected 45% (Hgb 13.7) 04/03/16: FVC 3.44 L (82%) FEV1 2.57 L (83%) FEV1/FVC 0.75 FEF 25-75 1.87 L (76%) no bronchodilator response TLC 6.28 L (81%) RV 93% ERV 106% DLCO corrected 55% (Hgb 13.0)  6MWT 10/05/16:  Walked 288 meters / Baseline Sat 93% on RA / Nadir Sat 90% on RA 08/18/16:  Walked 294 meters / Baseline Sat 92% on RA / Nadir Sat 86% on RA (rewalked 3 laps with lowest saturation 89% on room air) 05/10/16:  Walked 294 meters /  Baseline Sat 98% on RA / Nadir Sat 90% on RA  OVERNIGHT OXIMETRY 09/05/16:  On room air. 101.9 minutes with saturation </= 88%. Awake saturation 87%.   IMAGING MRI BRAIN W/ & W/O 12/17/15 (per radiologist):  No intracranial abnormality to suggest metastasis.   CT CHEST W/ 11/19/15 (previously reviewed by me): Subcarinal lymph node as well as right hilar lymph node measuring over 1 cm in short axis. Enlarging bilateral parenchymal nodules as well as persistent medial left lower lobe consolidation. No pleural effusion or thickening. No pericardial effusion.  CT CHEST W/ 10/06/15 (previously reviewed by me): Medial left lower lobe consolidation with air bronchograms and some groundglass component. Satellite nodules noted within left lower lobe, lingula, and left upper lobe. Right upper lobe nodules with largest measuring approximately 1.3 cm. No pericardial effusion. No pleural effusion or thickening. No pathologic mediastinal adenopathy.  CXR PA/LAT 09/09/15 (previously reviewed by me): Left hilar opacity. No pleural effusion or thickening appreciated. Heart normal in size. Mediastinum normal in contour.  PATHOLOGY  Bronchial Wash LLL 11/19/15:  Malignant cells consistent with non-small cell carcinoma.  Bronchial Wash RUL 11/19/15:  Atypical cells present. Bronchial Brush LLL 11/19/15: Malignant cells consistent with non-small cell carcinoma.  Endobronchial FNA/TBBx 11/19/15:  Adenocarcionma  MICROBIOLOGY BAL 11/19/15:  Oral Flora / AFB negative / Fungal negative  LABS 08/09/16 CBC: 11.7/12.5/38.6/152  11/24/15 CBC:  5.9/13.1/38.2/170 BMP:  140/4.1/106/28/11/1.15/113/9.1 Magnesium:  2.1  09/15/15 CBC: 4.6/14.6/42.8/151 BMP: 139/3.8/107/25/17/1.18/119/8.8 LFT: 3.7/6.3/0.6/75/16/13 LDH: 131    Assessment & Plan:  81 y.o. male with Stage IV adenocarcinoma of the left lung and nocturnal hypoxia. Patient's non-small cell lung cancer appears to be progressing on repeat CT imaging as of December.  Patient's walk test today shows no significant desaturation or oxygen requirement with ambulation which is encouraging. We did discuss his frequent daytime naps and nocturnal hypoxia which could be indicative of underlying sleep apnea given his body habitus. I believe treatment of sleep apnea could improve his quality of life with his frequent daytime napping. Alternatively, this could be secondary to his chemotherapy regimen. I instructed the patient to contact my office if he had any new breathing problems or questions before his next appointment.  1. Nocturnal Hypoxia: Repeating overnight oximetry on 2 L/m. Also checking polysomnogram. 2. Stage IV NSCLC: Follows with medical oncology. Appears to be progressing on repeat imaging.   3. Health Maintenance:  S/P Influenza Vaccine November 2017. Previously received Pneumovax. 4. Follow-up: Patient to return to clinic in 8 weeks or sooner if needed.  Sonia Baller Ashok Cordia, M.D. [facility] Pager:  [phone] After 3pm or if no response, call [phone] 11:28 AM 10/05/16

## 2016-10-12 ENCOUNTER — Telehealth: Payer: Self-pay | Admitting: Pulmonary Disease

## 2016-10-12 NOTE — Telephone Encounter (Signed)
OVERNIGHT OXIMETRY 09/05/16:  Performed on room air. 9:10:44 analyzed. Lowest saturation 80%. A total of 101.9 minutes with saturation </= 88%. Lowest pulse 53 bpm.  Already ordered for repeat oximetry on 2 L/m and polysomnogram.

## 2016-10-17 ENCOUNTER — Other Ambulatory Visit: Payer: Self-pay | Admitting: Pharmacist

## 2016-10-17 DIAGNOSIS — C3490 Malignant neoplasm of unspecified part of unspecified bronchus or lung: Secondary | ICD-10-CM

## 2016-10-18 ENCOUNTER — Ambulatory Visit: Payer: Medicare Other | Admitting: Internal Medicine

## 2016-10-18 ENCOUNTER — Ambulatory Visit: Payer: Medicare Other

## 2016-10-18 ENCOUNTER — Other Ambulatory Visit: Payer: Medicare Other

## 2016-10-21 ENCOUNTER — Telehealth: Payer: Self-pay

## 2016-10-21 NOTE — Telephone Encounter (Signed)
1/17 los noted,pt has an appt 1/31 for lab md and tx and dr mkm does not have anything avail.  Will just leave appt as is unless the desk nurse advises otherwise,will send diane a message back, I left a message as well noting this information     Matthew Berger

## 2016-10-23 ENCOUNTER — Encounter: Payer: Self-pay | Admitting: Pulmonary Disease

## 2016-10-24 ENCOUNTER — Encounter: Payer: Self-pay | Admitting: Pulmonary Disease

## 2016-10-30 ENCOUNTER — Telehealth: Payer: Self-pay | Admitting: Pulmonary Disease

## 2016-10-30 DIAGNOSIS — G4734 Idiopathic sleep related nonobstructive alveolar hypoventilation: Secondary | ICD-10-CM

## 2016-10-30 NOTE — Telephone Encounter (Signed)
OVERNIGHT PULSE OXIMETRY 01/22 - 10/24/16:  Performed on 2 L/m while sleeping. Lowest saturation 82%. 18.1 minutes with saturation </= 88%. Lowest pulse 65bpm.   Repeating overnight oximetry on 4 L/m while sleeping. Polysomnogram scheduled for 2/18.

## 2016-11-01 ENCOUNTER — Inpatient Hospital Stay (HOSPITAL_COMMUNITY)
Admission: EM | Admit: 2016-11-01 | Discharge: 2016-11-08 | DRG: 193 | Disposition: A | Discharge status: Skilled Nursing Facility | Payer: Medicare Other | Attending: Internal Medicine | Admitting: Internal Medicine

## 2016-11-01 ENCOUNTER — Other Ambulatory Visit: Payer: Self-pay

## 2016-11-01 ENCOUNTER — Ambulatory Visit: Payer: Medicare Other

## 2016-11-01 ENCOUNTER — Encounter: Payer: Self-pay | Admitting: Internal Medicine

## 2016-11-01 ENCOUNTER — Encounter (HOSPITAL_COMMUNITY): Payer: Self-pay | Admitting: *Deleted

## 2016-11-01 ENCOUNTER — Other Ambulatory Visit (HOSPITAL_BASED_OUTPATIENT_CLINIC_OR_DEPARTMENT_OTHER): Payer: Medicare Other

## 2016-11-01 ENCOUNTER — Ambulatory Visit (HOSPITAL_BASED_OUTPATIENT_CLINIC_OR_DEPARTMENT_OTHER): Payer: Medicare Other | Admitting: Internal Medicine

## 2016-11-01 ENCOUNTER — Emergency Department (HOSPITAL_COMMUNITY): Payer: Medicare Other

## 2016-11-01 ENCOUNTER — Inpatient Hospital Stay: Admission: AD | Admit: 2016-11-01 | Payer: Medicare Other | Source: Ambulatory Visit | Admitting: Internal Medicine

## 2016-11-01 VITALS — BP 108/69 | HR 131 | Temp 101.1°F | Resp 18 | Ht 74.0 in | Wt 221.6 lb

## 2016-11-01 DIAGNOSIS — C3432 Malignant neoplasm of lower lobe, left bronchus or lung: Secondary | ICD-10-CM | POA: Diagnosis present

## 2016-11-01 DIAGNOSIS — Z6828 Body mass index (BMI) 28.0-28.9, adult: Secondary | ICD-10-CM

## 2016-11-01 DIAGNOSIS — Z800 Family history of malignant neoplasm of digestive organs: Secondary | ICD-10-CM | POA: Diagnosis not present

## 2016-11-01 DIAGNOSIS — I5033 Acute on chronic diastolic (congestive) heart failure: Secondary | ICD-10-CM | POA: Diagnosis not present

## 2016-11-01 DIAGNOSIS — A419 Sepsis, unspecified organism: Secondary | ICD-10-CM | POA: Diagnosis not present

## 2016-11-01 DIAGNOSIS — R410 Disorientation, unspecified: Secondary | ICD-10-CM

## 2016-11-01 DIAGNOSIS — D696 Thrombocytopenia, unspecified: Secondary | ICD-10-CM | POA: Diagnosis not present

## 2016-11-01 DIAGNOSIS — Z5112 Encounter for antineoplastic immunotherapy: Secondary | ICD-10-CM

## 2016-11-01 DIAGNOSIS — E876 Hypokalemia: Secondary | ICD-10-CM | POA: Diagnosis present

## 2016-11-01 DIAGNOSIS — R0902 Hypoxemia: Secondary | ICD-10-CM

## 2016-11-01 DIAGNOSIS — J9621 Acute and chronic respiratory failure with hypoxia: Secondary | ICD-10-CM | POA: Diagnosis present

## 2016-11-01 DIAGNOSIS — D72819 Decreased white blood cell count, unspecified: Secondary | ICD-10-CM | POA: Diagnosis not present

## 2016-11-01 DIAGNOSIS — J189 Pneumonia, unspecified organism: Secondary | ICD-10-CM

## 2016-11-01 DIAGNOSIS — Y95 Nosocomial condition: Secondary | ICD-10-CM | POA: Diagnosis present

## 2016-11-01 DIAGNOSIS — Z79899 Other long term (current) drug therapy: Secondary | ICD-10-CM | POA: Diagnosis not present

## 2016-11-01 DIAGNOSIS — I10 Essential (primary) hypertension: Secondary | ICD-10-CM | POA: Diagnosis present

## 2016-11-01 DIAGNOSIS — Z803 Family history of malignant neoplasm of breast: Secondary | ICD-10-CM | POA: Diagnosis not present

## 2016-11-01 DIAGNOSIS — J9601 Acute respiratory failure with hypoxia: Secondary | ICD-10-CM | POA: Diagnosis not present

## 2016-11-01 DIAGNOSIS — C3491 Malignant neoplasm of unspecified part of right bronchus or lung: Secondary | ICD-10-CM

## 2016-11-01 DIAGNOSIS — Z7982 Long term (current) use of aspirin: Secondary | ICD-10-CM | POA: Diagnosis not present

## 2016-11-01 DIAGNOSIS — C3490 Malignant neoplasm of unspecified part of unspecified bronchus or lung: Secondary | ICD-10-CM

## 2016-11-01 DIAGNOSIS — J1001 Influenza due to other identified influenza virus with the same other identified influenza virus pneumonia: Secondary | ICD-10-CM | POA: Diagnosis not present

## 2016-11-01 DIAGNOSIS — C3411 Malignant neoplasm of upper lobe, right bronchus or lung: Secondary | ICD-10-CM | POA: Diagnosis present

## 2016-11-01 DIAGNOSIS — J101 Influenza due to other identified influenza virus with other respiratory manifestations: Secondary | ICD-10-CM | POA: Diagnosis not present

## 2016-11-01 DIAGNOSIS — I110 Hypertensive heart disease with heart failure: Secondary | ICD-10-CM | POA: Diagnosis present

## 2016-11-01 DIAGNOSIS — E43 Unspecified severe protein-calorie malnutrition: Secondary | ICD-10-CM | POA: Diagnosis not present

## 2016-11-01 DIAGNOSIS — C3431 Malignant neoplasm of lower lobe, right bronchus or lung: Secondary | ICD-10-CM | POA: Diagnosis present

## 2016-11-01 DIAGNOSIS — C7951 Secondary malignant neoplasm of bone: Secondary | ICD-10-CM | POA: Diagnosis present

## 2016-11-01 DIAGNOSIS — J1008 Influenza due to other identified influenza virus with other specified pneumonia: Secondary | ICD-10-CM | POA: Diagnosis not present

## 2016-11-01 DIAGNOSIS — C3412 Malignant neoplasm of upper lobe, left bronchus or lung: Secondary | ICD-10-CM | POA: Diagnosis present

## 2016-11-01 DIAGNOSIS — K219 Gastro-esophageal reflux disease without esophagitis: Secondary | ICD-10-CM | POA: Diagnosis present

## 2016-11-01 DIAGNOSIS — I471 Supraventricular tachycardia: Secondary | ICD-10-CM | POA: Diagnosis present

## 2016-11-01 DIAGNOSIS — J1100 Influenza due to unidentified influenza virus with unspecified type of pneumonia: Secondary | ICD-10-CM | POA: Diagnosis not present

## 2016-11-01 DIAGNOSIS — N400 Enlarged prostate without lower urinary tract symptoms: Secondary | ICD-10-CM | POA: Diagnosis present

## 2016-11-01 DIAGNOSIS — D638 Anemia in other chronic diseases classified elsewhere: Secondary | ICD-10-CM

## 2016-11-01 DIAGNOSIS — Z66 Do not resuscitate: Secondary | ICD-10-CM | POA: Diagnosis present

## 2016-11-01 DIAGNOSIS — Z8249 Family history of ischemic heart disease and other diseases of the circulatory system: Secondary | ICD-10-CM

## 2016-11-01 DIAGNOSIS — J159 Unspecified bacterial pneumonia: Secondary | ICD-10-CM | POA: Diagnosis present

## 2016-11-01 DIAGNOSIS — I4891 Unspecified atrial fibrillation: Secondary | ICD-10-CM | POA: Diagnosis present

## 2016-11-01 DIAGNOSIS — J918 Pleural effusion in other conditions classified elsewhere: Secondary | ICD-10-CM | POA: Diagnosis not present

## 2016-11-01 DIAGNOSIS — Z87891 Personal history of nicotine dependence: Secondary | ICD-10-CM | POA: Diagnosis not present

## 2016-11-01 DIAGNOSIS — B9789 Other viral agents as the cause of diseases classified elsewhere: Secondary | ICD-10-CM | POA: Diagnosis not present

## 2016-11-01 DIAGNOSIS — J9600 Acute respiratory failure, unspecified whether with hypoxia or hypercapnia: Secondary | ICD-10-CM | POA: Diagnosis present

## 2016-11-01 LAB — CBC WITH DIFFERENTIAL/PLATELET
BASO%: 0.2 % (ref 0.0–2.0)
BASOS ABS: 0 10*3/uL (ref 0.0–0.1)
Basophils Absolute: 0 10*3/uL (ref 0.0–0.1)
Basophils Relative: 0 %
EOS ABS: 0 10*3/uL (ref 0.0–0.5)
EOS ABS: 0 10*3/uL (ref 0.0–0.7)
EOS PCT: 0 %
EOS%: 0.4 % (ref 0.0–7.0)
HCT: 37.1 % — ABNORMAL LOW (ref 39.0–52.0)
HEMATOCRIT: 37.6 % — AB (ref 38.4–49.9)
HGB: 12.6 g/dL — ABNORMAL LOW (ref 13.0–17.1)
Hemoglobin: 12.6 g/dL — ABNORMAL LOW (ref 13.0–17.0)
LYMPH#: 0.6 10*3/uL — AB (ref 0.9–3.3)
LYMPH%: 10.1 % — AB (ref 14.0–49.0)
LYMPHS PCT: 5 %
Lymphs Abs: 0.3 10*3/uL — ABNORMAL LOW (ref 0.7–4.0)
MCH: 29.9 pg (ref 27.2–33.4)
MCH: 29.9 pg (ref 26.0–34.0)
MCHC: 33.5 g/dL (ref 32.0–36.0)
MCHC: 34 g/dL (ref 30.0–36.0)
MCV: 89.1 fL (ref 79.3–98.0)
MCV: 88.1 fL (ref 78.0–100.0)
MONO ABS: 0.5 10*3/uL (ref 0.1–1.0)
MONO#: 0.6 10*3/uL (ref 0.1–0.9)
MONO%: 11 % (ref 0.0–14.0)
Monocytes Relative: 9 %
NEUT%: 78.3 % — AB (ref 39.0–75.0)
NEUTROS ABS: 4.4 10*3/uL (ref 1.5–6.5)
Neutro Abs: 4.7 10*3/uL (ref 1.7–7.7)
Neutrophils Relative %: 86 %
PLATELETS: 164 10*3/uL (ref 140–400)
PLATELETS: 175 10*3/uL (ref 150–400)
RBC: 4.22 10*6/uL (ref 4.20–5.82)
RBC: 4.21 MIL/uL — AB (ref 4.22–5.81)
RDW: 13.6 % (ref 11.0–14.6)
RDW: 13.7 % (ref 11.5–15.5)
WBC: 5.7 10*3/uL (ref 4.0–10.3)
WBC: 5.5 10*3/uL (ref 4.0–10.5)

## 2016-11-01 LAB — I-STAT CHEM 8, ED
BUN: 16 mg/dL (ref 6–20)
CHLORIDE: 106 mmol/L (ref 101–111)
Calcium, Ion: 1.12 mmol/L — ABNORMAL LOW (ref 1.15–1.40)
Creatinine, Ser: 1.3 mg/dL — ABNORMAL HIGH (ref 0.61–1.24)
Glucose, Bld: 127 mg/dL — ABNORMAL HIGH (ref 65–99)
HEMATOCRIT: 39 % (ref 39.0–52.0)
Hemoglobin: 13.3 g/dL (ref 13.0–17.0)
POTASSIUM: 3.7 mmol/L (ref 3.5–5.1)
Sodium: 140 mmol/L (ref 135–145)
TCO2: 22 mmol/L (ref 0–100)

## 2016-11-01 LAB — COMPREHENSIVE METABOLIC PANEL
ALBUMIN: 3.3 g/dL — AB (ref 3.5–5.0)
ALBUMIN: 3.5 g/dL (ref 3.5–5.0)
ALK PHOS: 70 U/L (ref 40–150)
ALK PHOS: 54 U/L (ref 38–126)
ALT: 13 U/L (ref 0–55)
ALT: 15 U/L — AB (ref 17–63)
AST: 23 U/L (ref 5–34)
AST: 31 U/L (ref 15–41)
Anion Gap: 11 mEq/L (ref 3–11)
Anion gap: 8 (ref 5–15)
BILIRUBIN TOTAL: 0.39 mg/dL (ref 0.20–1.20)
BILIRUBIN TOTAL: 0.7 mg/dL (ref 0.3–1.2)
BUN: 15.8 mg/dL (ref 7.0–26.0)
BUN: 17 mg/dL (ref 6–20)
CO2: 24 meq/L (ref 22–29)
CO2: 24 mmol/L (ref 22–32)
CREATININE: 1.4 mg/dL — AB (ref 0.7–1.3)
CREATININE: 1.51 mg/dL — AB (ref 0.61–1.24)
Calcium: 9.7 mg/dL (ref 8.4–10.4)
Calcium: 9.2 mg/dL (ref 8.9–10.3)
Chloride: 106 mEq/L (ref 98–109)
Chloride: 106 mmol/L (ref 101–111)
EGFR: 57 mL/min/{1.73_m2} — AB (ref >90)
GFR calc Af Amer: 49 mL/min — ABNORMAL LOW (ref >60)
GFR, EST NON AFRICAN AMERICAN: 42 mL/min — AB (ref >60)
GLUCOSE: 127 mg/dL — AB (ref 65–99)
Glucose: 136 mg/dl (ref 70–140)
Potassium: 3.6 mEq/L (ref 3.5–5.1)
Potassium: 3.6 mmol/L (ref 3.5–5.1)
Sodium: 140 mEq/L (ref 136–145)
Sodium: 138 mmol/L (ref 135–145)
TOTAL PROTEIN: 7.6 g/dL (ref 6.4–8.3)
TOTAL PROTEIN: 7.7 g/dL (ref 6.5–8.1)

## 2016-11-01 LAB — URINALYSIS, ROUTINE W REFLEX MICROSCOPIC
Bilirubin Urine: NEGATIVE
Glucose, UA: NEGATIVE mg/dL
Hgb urine dipstick: NEGATIVE
Ketones, ur: NEGATIVE mg/dL
LEUKOCYTES UA: NEGATIVE
NITRITE: NEGATIVE
Protein, ur: NEGATIVE mg/dL
Specific Gravity, Urine: 1.019 (ref 1.005–1.030)
pH: 5 (ref 5.0–8.0)

## 2016-11-01 LAB — I-STAT CG4 LACTIC ACID, ED: Lactic Acid, Venous: 1.77 mmol/L (ref 0.5–1.9)

## 2016-11-01 LAB — INFLUENZA PANEL BY PCR (TYPE A & B)
INFLAPCR: NEGATIVE
INFLBPCR: POSITIVE — AB

## 2016-11-01 LAB — TSH: TSH: 0.833 m[IU]/L (ref 0.320–4.118)

## 2016-11-01 LAB — MRSA PCR SCREENING: MRSA by PCR: NEGATIVE

## 2016-11-01 LAB — STREP PNEUMONIAE URINARY ANTIGEN: Strep Pneumo Urinary Antigen: NEGATIVE

## 2016-11-01 MED ORDER — ASPIRIN EC 81 MG PO TBEC
81.0000 mg | DELAYED_RELEASE_TABLET | Freq: Every day | ORAL | Status: DC
  Administered 2016-11-01 – 2016-11-08 (×8): 81 mg via ORAL
  Filled 2016-11-01 (×9): qty 1

## 2016-11-01 MED ORDER — ACETAMINOPHEN 650 MG RE SUPP: 650.0000 mg | Freq: Four times a day (QID) | RECTAL | Status: DC | PRN

## 2016-11-01 MED ORDER — SODIUM CHLORIDE 0.9 % IV SOLN
INTRAVENOUS | Status: DC
  Administered 2016-11-01 – 2016-11-05 (×5): via INTRAVENOUS

## 2016-11-01 MED ORDER — ALBUTEROL SULFATE (2.5 MG/3ML) 0.083% IN NEBU: 2.5000 mg | INHALATION_SOLUTION | RESPIRATORY_TRACT | Status: DC | PRN

## 2016-11-01 MED ORDER — DEXTROSE 5 % IV SOLN
2.0000 g | Freq: Once | INTRAVENOUS | Status: AC
  Administered 2016-11-01: 2 g via INTRAVENOUS
  Filled 2016-11-01: qty 2

## 2016-11-01 MED ORDER — LATANOPROST 0.005 % OP SOLN
1.0000 [drp] | Freq: Every day | OPHTHALMIC | Status: DC
  Administered 2016-11-01 – 2016-11-07 (×7): 1 [drp] via OPHTHALMIC
  Filled 2016-11-01: qty 2.5

## 2016-11-01 MED ORDER — VANCOMYCIN HCL IN DEXTROSE 1-5 GM/200ML-% IV SOLN
1000.0000 mg | Freq: Once | INTRAVENOUS | Status: AC
  Administered 2016-11-01: 1000 mg via INTRAVENOUS
  Filled 2016-11-01: qty 200

## 2016-11-01 MED ORDER — OSELTAMIVIR PHOSPHATE 75 MG PO CAPS
75.0000 mg | ORAL_CAPSULE | Freq: Two times a day (BID) | ORAL | Status: AC
  Administered 2016-11-01 – 2016-11-05 (×9): 75 mg via ORAL
  Filled 2016-11-01 (×9): qty 1

## 2016-11-01 MED ORDER — GUAIFENESIN-DM 100-10 MG/5ML PO SYRP: 20.0000 mL | ORAL_SOLUTION | ORAL | Status: DC | PRN

## 2016-11-01 MED ORDER — VITAMIN B-12 1000 MCG PO TABS
1000.0000 ug | ORAL_TABLET | Freq: Every day | ORAL | Status: DC
  Administered 2016-11-01 – 2016-11-08 (×8): 1000 ug via ORAL
  Filled 2016-11-01 (×8): qty 1

## 2016-11-01 MED ORDER — OSELTAMIVIR PHOSPHATE 75 MG PO CAPS
75.0000 mg | ORAL_CAPSULE | Freq: Once | ORAL | Status: AC
  Administered 2016-11-01: 75 mg via ORAL
  Filled 2016-11-01: qty 1

## 2016-11-01 MED ORDER — TAMSULOSIN HCL 0.4 MG PO CAPS
0.4000 mg | ORAL_CAPSULE | Freq: Every day | ORAL | Status: DC
  Administered 2016-11-02 – 2016-11-08 (×7): 0.4 mg via ORAL
  Filled 2016-11-01 (×7): qty 1

## 2016-11-01 MED ORDER — METOPROLOL TARTRATE 25 MG PO TABS
50.0000 mg | ORAL_TABLET | Freq: Two times a day (BID) | ORAL | Status: DC
  Administered 2016-11-01 – 2016-11-02 (×2): 50 mg via ORAL
  Filled 2016-11-01 (×2): qty 2

## 2016-11-01 MED ORDER — PIPERACILLIN-TAZOBACTAM 3.375 G IVPB 30 MIN
3.3750 g | Freq: Once | INTRAVENOUS | Status: AC
  Administered 2016-11-01: 3.375 g via INTRAVENOUS
  Filled 2016-11-01: qty 50

## 2016-11-01 MED ORDER — ACETAMINOPHEN 325 MG PO TABS
650.0000 mg | ORAL_TABLET | Freq: Four times a day (QID) | ORAL | Status: DC | PRN
Start: 2016-11-01 — End: 2016-11-08
  Administered 2016-11-01 – 2016-11-05 (×7): 650 mg via ORAL
  Filled 2016-11-01 (×7): qty 2

## 2016-11-01 MED ORDER — VANCOMYCIN HCL IN DEXTROSE 750-5 MG/150ML-% IV SOLN
750.0000 mg | Freq: Two times a day (BID) | INTRAVENOUS | Status: DC
  Administered 2016-11-02: 750 mg via INTRAVENOUS
  Filled 2016-11-01 (×2): qty 150

## 2016-11-01 MED ORDER — ACETAMINOPHEN 500 MG PO TABS
1000.0000 mg | ORAL_TABLET | Freq: Once | ORAL | Status: AC
  Administered 2016-11-01: 1000 mg via ORAL
  Filled 2016-11-01: qty 2

## 2016-11-01 MED ORDER — DORZOLAMIDE HCL 2 % OP SOLN
1.0000 [drp] | Freq: Two times a day (BID) | OPHTHALMIC | Status: DC
  Administered 2016-11-01 – 2016-11-08 (×14): 1 [drp] via OPHTHALMIC
  Filled 2016-11-01: qty 10

## 2016-11-01 MED ORDER — SODIUM CHLORIDE 0.9 % IV BOLUS (SEPSIS)
1000.0000 mL | Freq: Once | INTRAVENOUS | Status: AC
  Administered 2016-11-01: 1000 mL via INTRAVENOUS

## 2016-11-01 MED ORDER — HEPARIN SODIUM (PORCINE) 5000 UNIT/ML IJ SOLN
5000.0000 [IU] | Freq: Three times a day (TID) | INTRAMUSCULAR | Status: DC
  Administered 2016-11-01: 5000 [IU] via SUBCUTANEOUS
  Filled 2016-11-01 (×3): qty 1

## 2016-11-01 MED ORDER — FINASTERIDE 5 MG PO TABS
5.0000 mg | ORAL_TABLET | Freq: Every day | ORAL | Status: DC
  Administered 2016-11-01 – 2016-11-08 (×8): 5 mg via ORAL
  Filled 2016-11-01 (×8): qty 1

## 2016-11-01 MED ORDER — DEXTROSE 5 % IV SOLN
1.0000 g | Freq: Three times a day (TID) | INTRAVENOUS | Status: DC
  Administered 2016-11-01 – 2016-11-05 (×12): 1 g via INTRAVENOUS
  Filled 2016-11-01 (×14): qty 1

## 2016-11-01 MED ORDER — FOLIC ACID 1 MG PO TABS
1.0000 mg | ORAL_TABLET | Freq: Every day | ORAL | Status: DC
  Administered 2016-11-02 – 2016-11-08 (×7): 1 mg via ORAL
  Filled 2016-11-01 (×7): qty 1

## 2016-11-01 MED ORDER — AMIODARONE HCL 200 MG PO TABS
200.0000 mg | ORAL_TABLET | Freq: Every day | ORAL | Status: DC
  Administered 2016-11-02 – 2016-11-08 (×7): 200 mg via ORAL
  Filled 2016-11-01 (×7): qty 1

## 2016-11-01 NOTE — ED Triage Notes (Signed)
Pt brought in from cancer center with flu like symptoms, per notes from Dr [name] pt to be admitted to step down unit. Upon arrival pt is febrile, tachy and hypoxic at 85% on 3L via N/C. Dr Tyrone Nine at bedside

## 2016-11-01 NOTE — Progress Notes (Signed)
Pt arrived from ED with Cyril Mourning, RN via stretcher.  HR 110's afib, 3LO2NC 87%.  Pt a&ox4.  Oriented to room, call bell in hand, bed alarm on.

## 2016-11-01 NOTE — ED Notes (Signed)
RN is checking for lactic acid that was due at 1334.  May need to d/c order.

## 2016-11-01 NOTE — ED Provider Notes (Signed)
[facility] DEPT Provider Note   CSN: [id number] Arrival date & time: 11/01/16  1014     History   Chief Complaint Chief Complaint  Patient presents with  . Shortness of Breath    cancer pt  . flu like symptoms    HPI Matthew Berger is a 81 y.o. male.  81 yo M with a chief complaint of cough congestion fevers and shortness of breath. Going on for the past couple days. Went to his oncologist office today for his lung cancer follow-up. Is on immunotherapy. On arrival for his appointment he was noted to be tachycardic into the 130s with a room air saturation of 84%. He was then sent to the ED for further evaluation. Patient states that he has been short winded over the past couple days. Difficulty getting up and moving around. Having some subjective chills. Has not checked his temperature at home.   The history is provided by the patient.  Shortness of Breath  This is a new problem. The average episode lasts 2 days. The problem occurs continuously.The current episode started more than 2 days ago. The problem has been gradually worsening. Associated symptoms include a fever and cough. Pertinent negatives include no headaches, no chest pain, no vomiting, no abdominal pain and no rash. He has tried nothing for the symptoms. The treatment provided no relief. Associated medical issues include chronic lung disease. Associated medical issues comments: lung ca.    Past Medical History:  Diagnosis Date  . Atrial fibrillation ([place])   . CHF (congestive heart failure) ([place])   . Encounter for antineoplastic chemotherapy 01/05/2016  . Encounter for antineoplastic immunotherapy 07/12/2016  . GERD (gastroesophageal reflux disease)   . Hypercholesteremia   . Hypertension   . Non-small cell lung cancer (NSCLC) ([place]) 11/19/15  . Pneumonia 11/18/2015    Patient Active Problem List   Diagnosis Date Noted  . Encounter for antineoplastic immunotherapy 07/12/2016  . SIRS (systemic inflammatory  response syndrome) ([place]) 06/28/2016  . Dehydration 06/28/2016  . Atrial fibrillation with rapid ventricular response ([place]) 06/28/2016  . Hyperglycemia 06/28/2016  . Dyspnea 02/04/2016  . Encounter for antineoplastic chemotherapy 01/05/2016  . GERD (gastroesophageal reflux disease) 12/02/2015  . Atrial fibrillation with RVR ([place]) 12/02/2015  . Non-small cell lung cancer (NSCLC) ([place]) 11/19/2015  . Pulmonary nodules/lesions, multiple   . Pneumonia 11/17/2015  . HTN (hypertension) 11/17/2015  . Dyslipidemia 11/17/2015  . BPH (benign prostatic hyperplasia) 11/17/2015    Past Surgical History:  Procedure Laterality Date  . CATARACT EXTRACTION Bilateral   . ROTATOR CUFF REPAIR Right   . VIDEO BRONCHOSCOPY Bilateral 11/19/2015   Procedure: VIDEO BRONCHOSCOPY WITH FLUORO;  Surgeon: Rigoberto Noel, MD;  Location: [facility];  Service: Cardiopulmonary;  Laterality: Bilateral;  . WISDOM TOOTH EXTRACTION         Home Medications    Prior to Admission medications   Medication Sig Start Date End Date Taking? Authorizing Provider  aspirin 81 MG tablet Take 81 mg by mouth daily.   Yes Historical Provider, MD  Brinzolamide-Brimonidine [facility]) 1-0.2 % SUSP Apply 2 drops to eye 2 (two) times daily.   Yes Historical Provider, MD  diltiazem (CARDIZEM CD) 180 MG 24 hr capsule Take 1 capsule (180 mg total) by mouth 2 (two) times daily. Patient taking differently: Take 180 mg by mouth daily.  07/06/16  Yes Florencia Reasons, MD  dorzolamide (TRUSOPT) 2 % ophthalmic solution Place 1 drop into the left eye 2 (two) times daily.   Yes  Historical Provider, MD  finasteride (PROSCAR) 5 MG tablet Take 5 mg by mouth daily. 10/09/16  Yes Historical Provider, MD  folic acid (FOLVITE) 1 MG tablet TAKE 1 TABLET (1 MG TOTAL) BY MOUTH DAILY. 03/10/16  Yes Historical Provider, MD  guaifenesin (ROBITUSSIN) 100 MG/5ML syrup Take 200 mg by mouth 3 (three) times daily as needed for cough.   Yes Historical Provider, MD    guaiFENesin-dextromethorphan (ROBITUSSIN DM) 100-10 MG/5ML syrup Take 20 mLs by mouth every 4 (four) hours as needed for cough.   Yes Historical Provider, MD  latanoprost (XALATAN) 0.005 % ophthalmic solution Place 1 drop into both eyes at bedtime.   Yes Historical Provider, MD  metoprolol (LOPRESSOR) 50 MG tablet Take 1 tablet (50 mg total) by mouth 2 (two) times daily. 07/06/16  Yes Florencia Reasons, MD  tamsulosin (FLOMAX) 0.4 MG CAPS capsule Take 0.4 mg by mouth daily.   Yes Historical Provider, MD  vitamin B-12 (CYANOCOBALAMIN) 1000 MCG tablet Take 1,000 mcg by mouth daily.   Yes Historical Provider, MD  amiodarone (PACERONE) 200 MG tablet Take 1 tablet (200 mg total) by mouth daily. Please call cardiology Dr Einar Gip for refills. 07/17/16 08/18/16  Florencia Reasons, MD  prochlorperazine (COMPAZINE) 10 MG tablet TAKE 1 TABLET BY MOUTH EVERY 6 HOURS AS NEEDED FOR NAUSEA AND VOMITING Patient not taking: Reported on 11/01/2016 04/13/16   Curt Bears, MD    Family History Family History  Problem Relation Age of Onset  . Breast cancer Sister   . Stroke Sister   . Colon cancer Brother   . Cancer Maternal Aunt     x2  . Hypertension Mother   . Stroke Mother   . Breast cancer Cousin   . Diabetes Other   . Hypertension Other   . Hyperlipidemia Other     Social History Social History  Substance Use Topics  . Smoking status: Former Smoker    Packs/day: 2.00    Years: 40.00    Types: Cigarettes    Quit date: 10/02/1990  . Smokeless tobacco: Never Used     Comment: smoked 1-2 ppd.   . Alcohol use No     Allergies   Patient has no known allergies.   Review of Systems Review of Systems  Constitutional: Positive for fever. Negative for chills.  HENT: Positive for congestion. Negative for facial swelling.   Eyes: Negative for discharge and visual disturbance.  Respiratory: Positive for cough and shortness of breath.   Cardiovascular: Negative for chest pain and palpitations.  Gastrointestinal:  Negative for abdominal pain, diarrhea and vomiting.  Musculoskeletal: Negative for arthralgias and myalgias.  Skin: Negative for color change and rash.  Neurological: Negative for tremors, syncope and headaches.  Psychiatric/Behavioral: Negative for confusion and dysphoric mood.     Physical Exam Updated Vital Signs BP 126/55   Pulse 97   Temp 100.8 F (38.2 C) (Oral)   Resp 25   SpO2 91%   Physical Exam  Constitutional: He is oriented to person, place, and time. He appears well-developed and well-nourished.  HENT:  Head: Normocephalic and atraumatic.  Swollen turbinates, posterior nasal drip, no noted sinus ttp, tm normal bilaterally.    Eyes: EOM are normal. Pupils are equal, round, and reactive to light.  Neck: Normal range of motion. Neck supple. No JVD present.  Cardiovascular: Normal rate and regular rhythm.  Exam reveals no gallop and no friction rub.   No murmur heard. Pulmonary/Chest: No respiratory distress. He has no wheezes.  Diffuse  rhonchi  Abdominal: He exhibits no distension and no mass. There is no tenderness. There is no rebound and no guarding.  Musculoskeletal: Normal range of motion.  Neurological: He is alert and oriented to person, place, and time.  Skin: No rash noted. No pallor.  Psychiatric: He has a normal mood and affect. His behavior is normal.  Nursing note and vitals reviewed.    ED Treatments / Results  Labs (all labs ordered are listed, but only abnormal results are displayed) Labs Reviewed  COMPREHENSIVE METABOLIC PANEL - Abnormal; Notable for the following:       Result Value   Glucose, Bld 127 (*)    Creatinine, Ser 1.51 (*)    ALT 15 (*)    GFR calc non Af Amer 42 (*)    GFR calc Af Amer 49 (*)    All other components within normal limits  CBC WITH DIFFERENTIAL/PLATELET - Abnormal; Notable for the following:    RBC 4.21 (*)    Hemoglobin 12.6 (*)    HCT 37.1 (*)    Lymphs Abs 0.3 (*)    All other components within normal  limits  I-STAT CHEM 8, ED - Abnormal; Notable for the following:    Creatinine, Ser 1.30 (*)    Glucose, Bld 127 (*)    Calcium, Ion 1.12 (*)    All other components within normal limits  CULTURE, BLOOD (ROUTINE X 2)  CULTURE, BLOOD (ROUTINE X 2)  URINE CULTURE  URINALYSIS, ROUTINE W REFLEX MICROSCOPIC  INFLUENZA PANEL BY PCR (TYPE A & B)  I-STAT CG4 LACTIC ACID, ED    EKG  EKG Interpretation  Date/Time:  Wednesday November 01 2016 10:31:26 EST Ventricular Rate:  120 PR Interval:    QRS Duration: 87 QT Interval:  326 QTC Calculation: 461 R Axis:   64 Text Interpretation:  Atrial fibrillation Ventricular premature complex Baseline wander in lead(s) II aVR aVF No significant change since last tracing Confirmed by Sherronda Sweigert MD, Quillian Quince ([id number]) on 11/01/2016 10:41:00 AM       Radiology Dg Chest 2 View  Result Date: 11/01/2016 CLINICAL DATA:  History of lung carcinoma with fever and flu like symptoms EXAM: CHEST  2 VIEW COMPARISON:  09/15/2016 FINDINGS: Cardiac shadow is within normal. Diffuse density noted throughout the left lung base similar to that seen on recent CT examination representing a component of neoplasm and consolidation. No sizable effusion is noted. The nodular changes seen in the right lung are less well appreciated on the current exam. IMPRESSION: Improved aeration in the left base although persistent density is noted similar to that seen on prior CT examination. The previously seen right-sided nodules are less well appreciated on the current study. Electronically Signed   By: Inez Catalina M.D.   On: 11/01/2016 11:20    Procedures Procedures (including critical care time)  Medications Ordered in ED Medications  vancomycin (VANCOCIN) IVPB 1000 mg/200 mL premix (1,000 mg Intravenous New Bag/Given 11/01/16 1132)  piperacillin-tazobactam (ZOSYN) IVPB 3.375 g (0 g Intravenous Stopped 11/01/16 1203)  sodium chloride 0.9 % bolus 1,000 mL (0 mLs Intravenous Stopped 11/01/16  1207)  acetaminophen (TYLENOL) tablet 1,000 mg (1,000 mg Oral Given 11/01/16 1131)     Initial Impression / Assessment and Plan / ED Course  I have reviewed the triage vital signs and the nursing notes.  Pertinent labs & imaging results that were available during my care of the patient were reviewed by me and considered in my medical decision making (see chart  for details).     81 yo M with a chief complaint of shortness of breath cough congestion fevers. Was seen in his oncologist office with concern for pneumonia. Patient arrived in the ED with an O2 sat in the mid to low 80s. Placed on oxygen and titrated up. Code sepsis was initiated. We'll vanc and Zosyn. Chest x-ray blood cultures urine cultures. Admit.  CXR without overt pna.  O2 sat 90-92% on 5L.  CRITICAL CARE Performed by: Cecilio Asper   Total critical care time: 35 minutes  Critical care time was exclusive of separately billable procedures and treating other patients.  Critical care was necessary to treat or prevent imminent or life-threatening deterioration.  Critical care was time spent personally by me on the following activities: development of treatment plan with patient and/or surrogate as well as nursing, discussions with consultants, evaluation of patient's response to treatment, examination of patient, obtaining history from patient or surrogate, ordering and performing treatments and interventions, ordering and review of laboratory studies, ordering and review of radiographic studies, pulse oximetry and re-evaluation of patient's condition.     The patients results and plan were reviewed and discussed.   Any x-rays performed were independently reviewed by myself.   Differential diagnosis were considered with the presenting HPI.  Medications  vancomycin (VANCOCIN) IVPB 1000 mg/200 mL premix (1,000 mg Intravenous New Bag/Given 11/01/16 1132)  piperacillin-tazobactam (ZOSYN) IVPB 3.375 g (0 g Intravenous  Stopped 11/01/16 1203)  sodium chloride 0.9 % bolus 1,000 mL (0 mLs Intravenous Stopped 11/01/16 1207)  acetaminophen (TYLENOL) tablet 1,000 mg (1,000 mg Oral Given 11/01/16 1131)    Vitals:   11/01/16 1032 11/01/16 1139 11/01/16 1145 11/01/16 1200  BP: 135/69 148/90  126/55  Pulse: 120 111 108 97  Resp: '24 18 23 25  '$ Temp: 100.8 F (38.2 C)     TempSrc: Oral     SpO2: (!) 84% 90% 92% 91%    Final diagnoses:  Hypoxia    Admission/ observation were discussed with the admitting physician, patient and/or family and they are comfortable with the plan.    Final Clinical Impressions(s) / ED Diagnoses   Final diagnoses:  Hypoxia    New Prescriptions New Prescriptions   No medications on file     Deno Etienne, DO 11/01/16 1226

## 2016-11-01 NOTE — ED Notes (Signed)
Bed: WA08 Expected date:  Expected time:  Means of arrival:  Comments: Cancer center, 86%, 130HR

## 2016-11-01 NOTE — Progress Notes (Signed)
[facility] [phone]   [phone]  OFFICE PROGRESS NOTE  [name], MD [address] [place] [address]  DIAGNOSIS: Stage IV (T3, N2, M1b) non-small cell lung cancer, adenocarcinoma with no unacceptable mutations diagnosed in February 2017 and presented with large left lower lobe lung mass, bilateral pulmonary nodules, right hilar and subcarinal lymphadenopathy as well as suspicious bone lesions.  PRIOR THERAPY: 1) Systemic chemotherapy with carboplatin for AUC of 5 and Alimta 500 MG/M2 every 3 weeks. First dose 12/29/2015. Status post 6 cycles. Last dose was given 04/19/2016 with stable disease. 2) Maintenance systemic chemotherapy with single agent Alimta 500 MG/M2 every 3 weeks. First dose 05/31/2016. Status post 2 cycles, last dose was given 06/21/2016 discontinued secondary to disease progression.  CURRENT THERAPY: immunotherapy with Nivolumab 240 mg IV every 2 weeks status post 6 cycles. First dose was given 07/22/2016.   INTERVAL HISTORY: Matthew Berger 81 y.o. male the clinic today accompanied by his son for follow-up visit. The patient doesn't feel well today. He started having flulike symptoms and productive cough with shortness of breath few days ago. He also has fever earlier today up to 101.1. His oxygen saturation in the clinic was 87% on room air. He lost around 10 pounds in the last few weeks secondary to lack of appetite and poor by mouth intake. He is tachycardic with a heart rate of 131. In general he is not feeling well. He was supposed to start cycle #7 of his treatment with immunotherapy today. He is here today for evaluation and recommendation regarding his condition.  MEDICAL HISTORY: Past Medical History:  Diagnosis Date  . Atrial fibrillation ([place])   . CHF (congestive heart failure) ([place])   . Encounter for antineoplastic chemotherapy 01/05/2016  . Encounter for antineoplastic immunotherapy 07/12/2016  . GERD  (gastroesophageal reflux disease)   . Hypercholesteremia   . Hypertension   . Non-small cell lung cancer (NSCLC) ([place]) 11/19/15  . Pneumonia 11/18/2015    ALLERGIES:  has No Known Allergies.  MEDICATIONS:  Current Outpatient Prescriptions  Medication Sig Dispense Refill  . aspirin 81 MG tablet Take 81 mg by mouth daily.    . Brinzolamide-Brimonidine (SIMBRINZA) 1-0.2 % SUSP Apply 2 drops to eye 2 (two) times daily.    Marland Kitchen diltiazem (CARDIZEM CD) 180 MG 24 hr capsule Take 1 capsule (180 mg total) by mouth 2 (two) times daily. (Patient taking differently: Take 180 mg by mouth daily. ) 60 capsule 0  . dorzolamide (TRUSOPT) 2 % ophthalmic solution Place 1 drop into the left eye 2 (two) times daily.    . fluticasone (FLONASE) 50 MCG/ACT nasal spray Place 2 sprays into both nostrils daily.    . folic acid (FOLVITE) 1 MG tablet TAKE 1 TABLET (1 MG TOTAL) BY MOUTH DAILY.  0  . guaifenesin (ROBITUSSIN) 100 MG/5ML syrup Take 200 mg by mouth 3 (three) times daily as needed for cough.    . latanoprost (XALATAN) 0.005 % ophthalmic solution Place 1 drop into both eyes at bedtime.    . metoprolol (LOPRESSOR) 50 MG tablet Take 1 tablet (50 mg total) by mouth 2 (two) times daily. 60 tablet 0  . tamsulosin (FLOMAX) 0.4 MG CAPS capsule Take 0.4 mg by mouth daily.    . vitamin B-12 (CYANOCOBALAMIN) 1000 MCG tablet Take 1,000 mcg by mouth daily.    Marland Kitchen amiodarone (PACERONE) 200 MG tablet Take 1 tablet (200 mg total) by mouth daily. Please call cardiology Dr  ganji for refills. 30 tablet 0  . prochlorperazine (COMPAZINE) 10 MG tablet TAKE 1 TABLET BY MOUTH EVERY 6 HOURS AS NEEDED FOR NAUSEA AND VOMITING (Patient not taking: Reported on 11/01/2016) 30 tablet 0   No current facility-administered medications for this visit.     SURGICAL HISTORY:  Past Surgical History:  Procedure Laterality Date  . CATARACT EXTRACTION Bilateral   . ROTATOR CUFF REPAIR Right   . VIDEO BRONCHOSCOPY Bilateral 11/19/2015   Procedure:  VIDEO BRONCHOSCOPY WITH FLUORO;  Surgeon: Rigoberto Noel, MD;  Location: [facility];  Service: Cardiopulmonary;  Laterality: Bilateral;  . WISDOM TOOTH EXTRACTION      REVIEW OF SYSTEMS:  Constitutional: positive for anorexia, fatigue, fevers and weight loss Eyes: negative Ears, nose, mouth, throat, and face: positive for nasal congestion Respiratory: positive for cough, dyspnea on exertion, sputum and wheezing Cardiovascular: negative Gastrointestinal: positive for nausea and vomiting Genitourinary:negative Integument/breast: negative Hematologic/lymphatic: negative Musculoskeletal:positive for muscle weakness Neurological: negative Behavioral/Psych: negative Endocrine: negative Allergic/Immunologic: negative   PHYSICAL EXAMINATION: General appearance: alert, cooperative, fatigued, no distress and toxic Head: Normocephalic, without obvious abnormality, atraumatic Neck: no adenopathy, no JVD, supple, symmetrical, trachea midline and thyroid not enlarged, symmetric, no tenderness/mass/nodules Lymph nodes: Cervical, supraclavicular, and axillary nodes normal. Resp: rales bilaterally Back: symmetric, no curvature. ROM normal. No CVA tenderness. Cardio: normal apical impulse and tachycardic GI: soft, non-tender; bowel sounds normal; no masses,  no organomegaly Extremities: extremities normal, atraumatic, no cyanosis or edema Neurologic: Alert and oriented X 3, normal strength and tone. Normal symmetric reflexes. Normal coordination and gait  ECOG PERFORMANCE STATUS: 1 - Symptomatic but completely ambulatory  Blood pressure 108/69, pulse (!) 131, temperature (!) 101.1 F (38.4 C), temperature source Oral, resp. rate 18, height '6\' 2"'$  (1.88 m), weight 221 lb 9.6 oz (100.5 kg), SpO2 (!) 87 %.  LABORATORY DATA: Lab Results  Component Value Date   WBC 5.7 11/01/2016   HGB 12.6 (L) 11/01/2016   HCT 37.6 (L) 11/01/2016   MCV 89.1 11/01/2016   PLT 164 11/01/2016      Chemistry        Component Value Date/Time   NA 140 10/04/2016 1025   K 3.7 10/04/2016 1025   CL 106 07/06/2016 0527   CO2 19 (L) 10/04/2016 1025   BUN 13.7 10/04/2016 1025   CREATININE 1.5 (H) 10/04/2016 1025      Component Value Date/Time   CALCIUM 9.2 10/04/2016 1025   ALKPHOS 62 10/04/2016 1025   AST 8 10/04/2016 1025   ALT <6 10/04/2016 1025   BILITOT 0.51 10/04/2016 1025       RADIOGRAPHIC STUDIES: No results found.  ASSESSMENT AND PLAN:  This is a very pleasant 81 years old African-American male with: 1) metastatic non-small cell lung cancer, adenocarcinoma currently undergoing treatment with immunotherapy with Nivolumab status post 6 cycles. He has been tolerating his treatment well with no significant adverse effects. I will hold his treatment for now until improvement of his recent infection. 2) highly suspicious pneumonia: The patient is febrile, toxic, tachycardic as well as dyspneic. These findings are consistent with pneumonia but other etiologies cannot be excluded. I will arrange for the patient to be admitted to [facility] for inpatient treatment of his pneumonia. 3) anemia of chronic disease: Hemoglobin and hematocrit are stable, we will continue to monitor for now. I will arrange for the patient to come back for follow-up visit after discharge from the hospital for reevaluation before resuming his immunotherapy. The patient was  advised to call immediately if he has any concerning symptoms in the interval. The patient voices understanding of current disease status and treatment options and is in agreement with the current care plan.  All questions were answered. The patient knows to call the clinic with any problems, questions or concerns. We can certainly see the patient much sooner if necessary. I spent 15 minutes counseling the patient face to face. The total time spent in the appointment was 25 minutes.  Disclaimer: This note was dictated with voice recognition  software. Similar sounding words can inadvertently be transcribed and may not be corrected upon review.

## 2016-11-01 NOTE — Progress Notes (Signed)
Pharmacy Antibiotic Follow-up Note  Matthew Berger is a 81 y.o. year-old male admitted on 11/01/2016.  The patient is currently on day 1 of 8 of Vancomycin & Cefepime for PNA.  Assessment/Plan: Vancomycin 1gm + 1gm for total 2gm loading dose, then '750mg'$   IV every 12 hours.  Goal trough 15-20 mcg/mL.  Cefepime 2gm x1, then 1gm q8hr Tamiflu '75mg'$  bid x 5 days  Temp (24hrs), Avg:101 F (38.3 C), Min:100.8 F (38.2 C), Max:101.1 F (38.4 C)   Recent Labs Lab 11/01/16 0913 11/01/16 1102  WBC 5.7 5.5    Recent Labs Lab 11/01/16 0913 11/01/16 1102 11/01/16 1107  CREATININE 1.4* 1.51* 1.30*   Estimated Creatinine Clearance: 59.3 mL/min (by C-G formula based on SCr of 1.3 mg/dL (H)).    No Known Allergies  Antimicrobials this admission: 1/31 Vanc >>  1/31 Cefepime >>   Levels/dose changes this admission:  Microbiology results: 1/31 BCx: sent 1/31 UCx: sent  1/31 Sputum: sent  1/31Flu PCR: positive 1/31 Strep pneumo: sent  Thank you for allowing pharmacy to be a part of this patient's care.  Minda Ditto PharmD Pager [phone] 11/01/2016, 4:47 PM

## 2016-11-01 NOTE — H&P (Signed)
TRH H&P   Patient Demographics:    Matthew Berger, is a 81 y.o. male  MRN: [id number]   DOB - 13-Dec-1935  Admit Date - 11/01/2016  Outpatient Primary MD for the patient is [name], MD  Referring MD/NP/PA: Dr Tyrone Nine  Outpatient Specialists: oncology Dr Earlie Server  Patient coming from: Oncology clinic  Chief Complaint  Patient presents with  . Shortness of Breath    cancer pt  . flu like symptoms      HPI:    Matthew Berger  is a 81 y.o. male, Currently being treated for stage IV lung cancer with metastasis, history of GERD, hypertension, was sent to ED from oncology clinic, he presents with complaints of fever, cough, productive sputum, shortness of breath and flulike symptoms, febrile and tachycardic in ED, so he was sent to ED, patient reports symptoms over last 48 hours, generalized body ache, fever, chills ,cough and runny nose, reports wife had similar symptoms but with diarrhea, in ED chest x-ray with significant left lung opacity, febrile 101.1, tachycardic, received IV vancomycin and Zosyn, hospitalist requested to admit for [facility].    Review of systems:    In addition to the HPI above,  reports Fever-chills, No Headache, No changes with Vision or hearing, No problems swallowing food or Liquids, No Chest pain, and planes of cough, productive and shortness of breath  No Abdominal pain, No Nausea or Vommitting, Bowel movements are regular, No Blood in stool or Urine, No dysuria, No new skin rashes or bruises, Place of generalized body ache No new weakness, tingling, numbness in any extremity, complaints of generalized weakness and fatigue No recent weight gain or loss, No polyuria, polydypsia or polyphagia, No significant Mental Stressors.  A full 10 point Review of Systems was done, except as stated above, all other Review of Systems were negative.   With Past  History of the following :    Past Medical History:  Diagnosis Date  . Atrial fibrillation ([place])   . CHF (congestive heart failure) ([place])   . Encounter for antineoplastic chemotherapy 01/05/2016  . Encounter for antineoplastic immunotherapy 07/12/2016  . GERD (gastroesophageal reflux disease)   . Hypercholesteremia   . Hypertension   . Non-small cell lung cancer (NSCLC) ([place]) 11/19/15  . Pneumonia 11/18/2015      Past Surgical History:  Procedure Laterality Date  . CATARACT EXTRACTION Bilateral   . ROTATOR CUFF REPAIR Right   . VIDEO BRONCHOSCOPY Bilateral 11/19/2015   Procedure: VIDEO BRONCHOSCOPY WITH FLUORO;  Surgeon: Rigoberto Noel, MD;  Location: [facility];  Service: Cardiopulmonary;  Laterality: Bilateral;  . WISDOM TOOTH EXTRACTION        Social History:     Social History  Substance Use Topics  . Smoking status: Former Smoker    Packs/day: 2.00    Years: 40.00    Types: Cigarettes    Quit date: 10/02/1990  .  Smokeless tobacco: Never Used     Comment: smoked 1-2 ppd.   . Alcohol use No     Lives - at home  Mobility - independent    Family History :     Family History  Problem Relation Age of Onset  . Breast cancer Sister   . Stroke Sister   . Colon cancer Brother   . Cancer Maternal Aunt     x2  . Hypertension Mother   . Stroke Mother   . Breast cancer Cousin   . Diabetes Other   . Hypertension Other   . Hyperlipidemia Other       Home Medications:   Prior to Admission medications   Medication Sig Start Date End Date Taking? Authorizing Provider  aspirin 81 MG tablet Take 81 mg by mouth daily.   Yes Historical Provider, MD  Brinzolamide-Brimonidine [facility]) 1-0.2 % SUSP Apply 2 drops to eye 2 (two) times daily.   Yes Historical Provider, MD  diltiazem (CARDIZEM CD) 180 MG 24 hr capsule Take 1 capsule (180 mg total) by mouth 2 (two) times daily. Patient taking differently: Take 180 mg by mouth daily.  07/06/16  Yes Florencia Reasons, MD  dorzolamide  (TRUSOPT) 2 % ophthalmic solution Place 1 drop into the left eye 2 (two) times daily.   Yes Historical Provider, MD  finasteride (PROSCAR) 5 MG tablet Take 5 mg by mouth daily. 10/09/16  Yes Historical Provider, MD  folic acid (FOLVITE) 1 MG tablet TAKE 1 TABLET (1 MG TOTAL) BY MOUTH DAILY. 03/10/16  Yes Historical Provider, MD  guaifenesin (ROBITUSSIN) 100 MG/5ML syrup Take 200 mg by mouth 3 (three) times daily as needed for cough.   Yes Historical Provider, MD  guaiFENesin-dextromethorphan (ROBITUSSIN DM) 100-10 MG/5ML syrup Take 20 mLs by mouth every 4 (four) hours as needed for cough.   Yes Historical Provider, MD  latanoprost (XALATAN) 0.005 % ophthalmic solution Place 1 drop into both eyes at bedtime.   Yes Historical Provider, MD  metoprolol (LOPRESSOR) 50 MG tablet Take 1 tablet (50 mg total) by mouth 2 (two) times daily. 07/06/16  Yes Florencia Reasons, MD  tamsulosin (FLOMAX) 0.4 MG CAPS capsule Take 0.4 mg by mouth daily.   Yes Historical Provider, MD  vitamin B-12 (CYANOCOBALAMIN) 1000 MCG tablet Take 1,000 mcg by mouth daily.   Yes Historical Provider, MD  amiodarone (PACERONE) 200 MG tablet Take 1 tablet (200 mg total) by mouth daily. Please call cardiology Dr Einar Gip for refills. 07/17/16 08/18/16  Florencia Reasons, MD  prochlorperazine (COMPAZINE) 10 MG tablet TAKE 1 TABLET BY MOUTH EVERY 6 HOURS AS NEEDED FOR NAUSEA AND VOMITING Patient not taking: Reported on 11/01/2016 04/13/16   Curt Bears, MD     Allergies:    No Known Allergies   Physical Exam:   Vitals  Blood pressure 126/55, pulse 97, temperature 100.8 F (38.2 C), temperature source Oral, resp. rate 25, SpO2 91 %.   1. General Elderly frail male lying in bed in NAD,   2. Normal affect and insight, Not Suicidal or Homicidal, Awake Alert, Oriented X 3.  3. No F.N deficits, ALL C.Nerves Intact, Strength 5/5 all 4 extremities, Sensation intact all 4 extremities, Plantars down going.  4. Ears and Eyes appear Normal, Conjunctivae clear,  PERRLA. Moist Oral Mucosa.  5. Supple Neck, No JVD, No cervical lymphadenopathy appriciated, No Carotid Bruits.  6. Symmetrical Chest wall movement, Good air movement bilaterally, CTAB.  7. Irregular irregular, No Gallops, Rubs or Murmurs, No Parasternal  Heave.  8. Positive Bowel Sounds, Abdomen Soft, No tenderness, No organomegaly appriciated,No rebound -guarding or rigidity.  9.  No Cyanosis, Normal Skin Turgor, No Skin Rash or Bruise.  10. Good muscle tone,  joints appear normal , no effusions, Normal ROM.     Data Review:    CBC  Recent Labs Lab 11/01/16 0913 11/01/16 1102 11/01/16 1107  WBC 5.7 5.5  --   HGB 12.6* 12.6* 13.3  HCT 37.6* 37.1* 39.0  PLT 164 175  --   MCV 89.1 88.1  --   MCH 29.9 29.9  --   MCHC 33.5 34.0  --   RDW 13.6 13.7  --   LYMPHSABS 0.6* 0.3*  --   MONOABS 0.6 0.5  --   EOSABS 0.0 0.0  --   BASOSABS 0.0 0.0  --    ------------------------------------------------------------------------------------------------------------------  Chemistries   Recent Labs Lab 11/01/16 0913 11/01/16 1102 11/01/16 1107  NA 140 138 140  K 3.6 3.6 3.7  CL  --  106 106  CO2 24 24  --   GLUCOSE 136 127* 127*  BUN 15.'8 17 16  '$ CREATININE 1.4* 1.51* 1.30*  CALCIUM 9.7 9.2  --   AST 23 31  --   ALT 13 15*  --   ALKPHOS 70 54  --   BILITOT 0.39 0.7  --    ------------------------------------------------------------------------------------------------------------------ estimated creatinine clearance is 59.3 mL/min (by C-G formula based on SCr of 1.3 mg/dL (H)). ------------------------------------------------------------------------------------------------------------------  Recent Labs  11/01/16 0913  TSH 0.833    Coagulation profile No results for input(s): INR, PROTIME in the last 168 hours. ------------------------------------------------------------------------------------------------------------------- No results for input(s): DDIMER in the  last 72 hours. -------------------------------------------------------------------------------------------------------------------  Cardiac Enzymes No results for input(s): CKMB, TROPONINI, MYOGLOBIN in the last 168 hours.  Invalid input(s): CK ------------------------------------------------------------------------------------------------------------------    Component Value Date/Time   BNP 123.7 (H) 06/28/2016 0946     ---------------------------------------------------------------------------------------------------------------  Urinalysis    Component Value Date/Time   COLORURINE AMBER (A) 06/28/2016 0950   APPEARANCEUR CLOUDY (A) 06/28/2016 0950   LABSPEC 1.022 06/28/2016 0950   PHURINE 6.0 06/28/2016 0950   GLUCOSEU 500 (A) 06/28/2016 0950   HGBUR NEGATIVE 06/28/2016 0950   BILIRUBINUR NEGATIVE 06/28/2016 0950   KETONESUR NEGATIVE 06/28/2016 0950   PROTEINUR 30 (A) 06/28/2016 0950   UROBILINOGEN 1.0 08/25/2010 1351   NITRITE NEGATIVE 06/28/2016 0950   LEUKOCYTESUR NEGATIVE 06/28/2016 0950    ----------------------------------------------------------------------------------------------------------------   Imaging Results:    Dg Chest 2 View  Result Date: 11/01/2016 CLINICAL DATA:  History of lung carcinoma with fever and flu like symptoms EXAM: CHEST  2 VIEW COMPARISON:  09/15/2016 FINDINGS: Cardiac shadow is within normal. Diffuse density noted throughout the left lung base similar to that seen on recent CT examination representing a component of neoplasm and consolidation. No sizable effusion is noted. The nodular changes seen in the right lung are less well appreciated on the current exam. IMPRESSION: Improved aeration in the left base although persistent density is noted similar to that seen on prior CT examination. The previously seen right-sided nodules are less well appreciated on the current study. Electronically Signed   By: Inez Catalina M.D.   On: 11/01/2016  11:20    My personal review of EKG: Rhythm Afib, Rate  120 /min, QTc471 , no Acute ST changes   Assessment & Plan:    Active Problems:   HTN (hypertension)   BPH (benign prostatic hyperplasia)   GERD (gastroesophageal reflux disease)  Non-small cell lung cancer (NSCLC) ([place])   Hypoxia   HCAP (healthcare-associated pneumonia)  HCAP - Patient presents with fever, dyspnea, hypoxia, and left left base density(complete for related to cancer), he is admitted under pneumonia pathway giving his symptoms, will continue with IV vancomycin and cefepime, follow blood cultures and sputum cultures. - Will check influenza panel, meanwhile will give one dose of Tamiflu empirically  Acute on chronic hypoxic respiratory failure - Asymptomatic on nasal cannula at bedtime only, currently hypoxic on 5 L nasal cannula, most likely related to above.  Hypertension - Blood pressure soft, will continue with metoprolol, to neutral hold Cardizem.  BPH - Continue with home medication  Multifocal atrial tachycardia - Seen in the past by cardiology, initially thought to be having atrial fibrillation during previous admission on 06/2016, recommendation for beta blocker, calcium channel blocker, amiodarone, will hold calcium channel blocker giving soft blood pressure.  Non-small cell lung cancer - Hold by Dr. Earlie Server as an outpatient, seen by him today in the clinic, was sent by ED for further evaluation.  Chronic Diastolic CHF - with last ECHO 11/2015 with stable EF 60% and grade I diastolic CHF - Appears to be dry, continue with gentle hydration  DVT Prophylaxis Heparin -   SCD  AM Labs Ordered, also please review Full Orders  Family Communication: Admission, patients condition and plan of care including tests being ordered have been discussed with the patient and Son and daughter-in-law who indicate understanding and agree with the plan and Code Status.  Code Status DNR/DNI  Likely DC to   Home/pendingPT  Condition GUARDED    Consults called: None  Admission status: obsevation  Time spent in minutes :20mnutes   EWaldron Labs DAWOOD M.D on 11/01/2016 at 12:54 PM  Between 7am to 7pm - Pager - [phone] After 7pm go to [email or web address] - password [email or web address] - Office  [phone]

## 2016-11-01 NOTE — Progress Notes (Signed)
Pt to be admitted to hospitalist service to step down unit. Per bed placement no room available for a while . Per [name] pt transported to ED to be monitered.report called to charge nurse.Transported via wheelchair with oxygen.

## 2016-11-02 DIAGNOSIS — E43 Unspecified severe protein-calorie malnutrition: Secondary | ICD-10-CM

## 2016-11-02 DIAGNOSIS — J9621 Acute and chronic respiratory failure with hypoxia: Secondary | ICD-10-CM

## 2016-11-02 DIAGNOSIS — E876 Hypokalemia: Secondary | ICD-10-CM

## 2016-11-02 DIAGNOSIS — J1100 Influenza due to unidentified influenza virus with unspecified type of pneumonia: Secondary | ICD-10-CM

## 2016-11-02 DIAGNOSIS — C3490 Malignant neoplasm of unspecified part of unspecified bronchus or lung: Secondary | ICD-10-CM

## 2016-11-02 LAB — URINE CULTURE: Culture: NO GROWTH

## 2016-11-02 LAB — BASIC METABOLIC PANEL
Anion gap: 7 (ref 5–15)
BUN: 16 mg/dL (ref 6–20)
CO2: 24 mmol/L (ref 22–32)
CREATININE: 1.41 mg/dL — AB (ref 0.61–1.24)
Calcium: 8.5 mg/dL — ABNORMAL LOW (ref 8.9–10.3)
Chloride: 106 mmol/L (ref 101–111)
GFR calc non Af Amer: 46 mL/min — ABNORMAL LOW (ref >60)
GFR, EST AFRICAN AMERICAN: 54 mL/min — AB (ref >60)
Glucose, Bld: 109 mg/dL — ABNORMAL HIGH (ref 65–99)
POTASSIUM: 3.3 mmol/L — AB (ref 3.5–5.1)
SODIUM: 137 mmol/L (ref 135–145)

## 2016-11-02 LAB — CBC
HCT: 30.6 % — ABNORMAL LOW (ref 39.0–52.0)
Hemoglobin: 10.5 g/dL — ABNORMAL LOW (ref 13.0–17.0)
MCH: 30.3 pg (ref 26.0–34.0)
MCHC: 34.3 g/dL (ref 30.0–36.0)
MCV: 88.2 fL (ref 78.0–100.0)
PLATELETS: 142 10*3/uL — AB (ref 150–400)
RBC: 3.47 MIL/uL — ABNORMAL LOW (ref 4.22–5.81)
RDW: 13.7 % (ref 11.5–15.5)
WBC: 4.7 10*3/uL (ref 4.0–10.5)

## 2016-11-02 LAB — CBC AND DIFFERENTIAL
HCT: 31 % — AB (ref 41–53)
HEMOGLOBIN: 10.5 g/dL — AB (ref 13.5–17.5)
PLATELETS: 142 10*3/uL — AB (ref 150–399)
WBC: 4.7 10*3/mL

## 2016-11-02 MED ORDER — SODIUM CHLORIDE 0.9 % IV BOLUS (SEPSIS)
500.0000 mL | Freq: Once | INTRAVENOUS | Status: AC
  Administered 2016-11-02: 500 mL via INTRAVENOUS

## 2016-11-02 MED ORDER — SODIUM CHLORIDE 0.9 % IV BOLUS (SEPSIS)
1000.0000 mL | Freq: Once | INTRAVENOUS | Status: AC
  Administered 2016-11-02: 1000 mL via INTRAVENOUS

## 2016-11-02 MED ORDER — IPRATROPIUM-ALBUTEROL 0.5-2.5 (3) MG/3ML IN SOLN: 3.0000 mL | Freq: Three times a day (TID) | RESPIRATORY_TRACT | Status: DC

## 2016-11-02 MED ORDER — IPRATROPIUM-ALBUTEROL 0.5-2.5 (3) MG/3ML IN SOLN
3.0000 mL | Freq: Three times a day (TID) | RESPIRATORY_TRACT | Status: DC
  Administered 2016-11-03 – 2016-11-08 (×17): 3 mL via RESPIRATORY_TRACT
  Filled 2016-11-02 (×17): qty 3

## 2016-11-02 MED ORDER — IPRATROPIUM-ALBUTEROL 0.5-2.5 (3) MG/3ML IN SOLN
3.0000 mL | Freq: Four times a day (QID) | RESPIRATORY_TRACT | Status: DC
  Administered 2016-11-02: 3 mL via RESPIRATORY_TRACT
  Filled 2016-11-02: qty 3

## 2016-11-02 MED ORDER — ENOXAPARIN SODIUM 30 MG/0.3ML ~~LOC~~ SOLN
30.0000 mg | SUBCUTANEOUS | Status: DC
  Administered 2016-11-02 – 2016-11-04 (×3): 30 mg via SUBCUTANEOUS
  Filled 2016-11-02 (×3): qty 0.3

## 2016-11-02 MED ORDER — METOPROLOL TARTRATE 25 MG PO TABS
12.5000 mg | ORAL_TABLET | Freq: Two times a day (BID) | ORAL | Status: DC
  Administered 2016-11-03 – 2016-11-08 (×10): 12.5 mg via ORAL
  Filled 2016-11-02 (×10): qty 1

## 2016-11-02 MED ORDER — IBUPROFEN 200 MG PO TABS
400.0000 mg | ORAL_TABLET | Freq: Once | ORAL | Status: AC
  Administered 2016-11-02: 400 mg via ORAL
  Filled 2016-11-02: qty 2

## 2016-11-02 MED ORDER — FLUTICASONE PROPIONATE 50 MCG/ACT NA SUSP
1.0000 | Freq: Every day | NASAL | Status: DC
  Administered 2016-11-02 – 2016-11-08 (×6): 1 via NASAL
  Filled 2016-11-02 (×2): qty 16

## 2016-11-02 MED ORDER — ENSURE ENLIVE PO LIQD
237.0000 mL | Freq: Three times a day (TID) | ORAL | Status: DC
  Administered 2016-11-02 – 2016-11-08 (×14): 237 mL via ORAL

## 2016-11-02 MED ORDER — GUAIFENESIN ER 600 MG PO TB12
600.0000 mg | ORAL_TABLET | Freq: Two times a day (BID) | ORAL | Status: DC
  Administered 2016-11-02 – 2016-11-08 (×12): 600 mg via ORAL
  Filled 2016-11-02 (×12): qty 1

## 2016-11-02 NOTE — Progress Notes (Signed)
PROGRESS NOTE  Matthew Berger OFH:[id number] DOB: November 07, 1935 DOA: 11/01/2016 PCP: [name], MD  HPI/Recap of past 24 hours:  Continue spike fever ,running nose, with nonproductive cough Sitting in chair, Son at bedside  Assessment/Plan: Active Problems:   HTN (hypertension)   BPH (benign prostatic hyperplasia)   GERD (gastroesophageal reflux disease)   Non-small cell lung cancer (NSCLC) ([place])   Hypoxia   HCAP (healthcare-associated pneumonia)   Acute respiratory failure (HCC)  Influenza b: tamilfu , supportive care  Pneumonia  D/c vanc continue cefepime  Hypokalemia, replace k  Acute on chronic hypoxic respiratory failure - Asymptomatic on nasal cannula at bedtime only, currently hypoxic on 5 L nasal cannula, most likely related to above.   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   Chronic Diastolic CHF - with last ECHO 11/2015 with stable EF 60% and grade I diastolic CHF - Appears to be dry, continue with gentle hydration  Hypertension - Blood pressure soft, reduce metoprolol dose,  hold Cardizem.  BPH - Continue with home medication  Non-small cell lung cancer - immunotherapy on Hold by oncology Dr. Earlie Server due to acute infection. CT chest concerning for cancer progression  DVT Prophylaxis lovenox  Code Status: full  Family Communication: patient and son at bedside  Disposition Plan: pending   Consultants:  none  Procedures:  none  Antibiotics:  vanc from admission to 2/1  Cefepime from admission   Objective: BP (!) 145/66 (BP Location: Right Arm)   Pulse 74   Temp 99.1 F (37.3 C) (Oral)   Resp 16   Ht '6\' 2"'$  (1.88 m)   Wt 100.6 kg (221 lb 12.5 oz)   SpO2 94%   BMI 28.48  kg/m   Intake/Output Summary (Last 24 hours) at 11/02/16 0853 Last data filed at 11/02/16 0800  Gross per 24 hour  Intake             1695 ml  Output             1000 ml  Net              695 ml   Filed Weights   11/01/16 1826  Weight: 100.6 kg (221 lb 12.5 oz)    Exam:   General:  NAD  Cardiovascular: RRR  Respiratory: CTABL  Abdomen: Soft/ND/NT, positive BS  Musculoskeletal: No Edema  Neuro: aaox3  Data Reviewed: Basic Metabolic Panel:  Recent Labs Lab 11/01/16 0913 11/01/16 1102 11/01/16 1107 11/02/16 0348  NA 140 138 140 137  K 3.6 3.6 3.7 3.3*  CL  --  106 106 106  CO2 24 24  --  24  GLUCOSE 136 127* 127* 109*  BUN 15.'8 17 16 16  '$ CREATININE 1.4* 1.51* 1.30* 1.41*  CALCIUM 9.7 9.2  --  8.5*   Liver Function Tests:  Recent Labs Lab 11/01/16 0913 11/01/16 1102  AST 23 31  ALT 13 15*  ALKPHOS 70 54  BILITOT 0.39 0.7  PROT 7.6 7.7  ALBUMIN 3.3* 3.5   No results for input(s): LIPASE, AMYLASE in the last 168 hours. No results for input(s): AMMONIA in the last 168 hours. CBC:  Recent Labs Lab 11/01/16 0913 11/01/16 1102 11/01/16 1107 11/02/16 0348  WBC 5.7 5.5  --  4.7  NEUTROABS 4.4 4.7  --   --  HGB 12.6* 12.6* 13.3 10.5*  HCT 37.6* 37.1* 39.0 30.6*  MCV 89.1 88.1  --  88.2  PLT 164 175  --  142*   Cardiac Enzymes:   No results for input(s): CKTOTAL, CKMB, CKMBINDEX, TROPONINI in the last 168 hours. BNP (last 3 results)  Recent Labs  06/28/16 0946  BNP 123.7*    ProBNP (last 3 results) No results for input(s): PROBNP in the last 8760 hours.  CBG: No results for input(s): GLUCAP in the last 168 hours.  Recent Results (from the past 240 hour(s))  Blood Culture (routine x 2)     Status: None (Preliminary result)   Collection Time: 11/01/16 10:54 AM  Result Value Ref Range Status   Specimen Description BLOOD RIGHT ANTECUBITAL  Final   Special Requests BOTTLES DRAWN AEROBIC AND ANAEROBIC 5CC  Final   Culture   Final    NO  GROWTH <12 HOURS Performed at [facility], [address], [place], [place] [address]    Report Status PENDING  Incomplete  Blood Culture (routine x 2)     Status: None (Preliminary result)   Collection Time: 11/01/16 10:58 AM  Result Value Ref Range Status   Specimen Description BLOOD LEFT ANTECUBITAL  Final   Special Requests BOTTLES DRAWN AEROBIC AND ANAEROBIC 5ML  Final   Culture   Final    NO GROWTH <12 HOURS Performed at [facility], [place] [address] [place], [place] [address]    Report Status PENDING  Incomplete  MRSA PCR Screening     Status: None   Collection Time: 11/01/16  6:38 PM  Result Value Ref Range Status   MRSA by PCR NEGATIVE NEGATIVE Final    Comment:        The GeneXpert MRSA Assay (FDA approved for NASAL specimens only), is one component of a comprehensive MRSA colonization surveillance program. It is not intended to diagnose MRSA infection nor to guide or monitor treatment for MRSA infections.      Studies: Dg Chest 2 View  Result Date: 11/01/2016 CLINICAL DATA:  History of lung carcinoma with fever and flu like symptoms EXAM: CHEST  2 VIEW COMPARISON:  09/15/2016 FINDINGS: Cardiac shadow is within normal. Diffuse density noted throughout the left lung base similar to that seen on recent CT examination representing a component of neoplasm and consolidation. No sizable effusion is noted. The nodular changes seen in the right lung are less well appreciated on the current exam. IMPRESSION: Improved aeration in the left base although persistent density is noted similar to that seen on prior CT examination. The previously seen right-sided nodules are less well appreciated on the current study. Electronically Signed   By: Inez Catalina M.D.   On: 11/01/2016 11:20    Scheduled Meds: . amiodarone  200 mg Oral Daily  . aspirin EC  81 mg Oral Daily  . ceFEPime (MAXIPIME) IV  1 g Intravenous Q8H  . dorzolamide  1 drop Left Eye BID  . finasteride  5 mg  Oral Daily  . folic acid  1 mg Oral Daily  . heparin  5,000 Units Subcutaneous Q8H  . latanoprost  1 drop Both Eyes QHS  . metoprolol  50 mg Oral BID  . oseltamivir  75 mg Oral BID  . tamsulosin  0.4 mg Oral Daily  . vancomycin  750 mg Intravenous Q12H  . vitamin B-12  1,000 mcg Oral Daily    Continuous Infusions: . sodium chloride 75 mL/hr at  11/02/16 0800     Time spent: 48mns  Camren Lipsett MD, PhD  [facility] Pager [phone] If 7PM-7AM, please contact night-coverage at [email or web address], password [facility]2/10/2016, 8:53 AM  LOS: 1 day

## 2016-11-02 NOTE — Progress Notes (Signed)
Initial Nutrition Assessment  DOCUMENTATION CODES:   Severe malnutrition in context of acute illness/injury  INTERVENTION:  -Ensure Enlive TID, each supplement provides 350 calories and 20 grams protein    NUTRITION DIAGNOSIS:   Malnutrition related to acute illness as evidenced by moderate depletion of body fat, moderate depletions of muscle mass, energy intake < or equal to 50% for > or equal to 5 days, percent weight loss (5% in < 1 mo).  GOAL:   Patient will meet greater than or equal to 90% of their needs  MONITOR:   PO intake, Supplement acceptance, Weight trends, I & O's  REASON FOR ASSESSMENT:   Malnutrition Screening Tool    ASSESSMENT:   81 y.o. male, Currently being treated for stage IV lung cancer with metastasis, history of GERD, hypertension, was sent to ED from oncology clinic, he presents with complaints of fever, cough, productive sputum, shortness of breath and flulike symptoms, febrile and tachycardic in ED, so he was sent to ED, patient reports symptoms over last 48 hours, generalized body ache, fever, chills ,cough and runny nose, reports wife had similar symptoms but with diarrhea, in ED chest x-ray with significant left lung opacity, febrile 101.1, tachycardic, received IV vancomycin and Zosyn, hospitalist requested to admit for [facility].  Pt reports recent loss of appetite due to N/V. Pt stated he ate well yesterday consuming a Boost on the way to chemo and some spaghetti and carrots for dinner. This morning he attempted to consume grits and cheese but felt "sick to his stomach". At lunchtime pt was not up for ordering due to symptoms.   Pt has experienced taste changes and stated meat becomes fatty tasting and makes him nauseous.  Pt consumed Boost PTA and is willing to continue nutritional supplementation of Ensure Enlive to provide calories and protein.   Pt reports 10 lb wt loss in a few weeks. Per chart review, pt weighed 233 lbs on 1/4 (5% wt loss in < 1  mo-  significant to time frame)  Per nutrition focused physical exam pt showed moderate fat depletion, moderate muscle depletion and no edema.   Labs reviewed; Creatinine 1.41, GFR 46, Hemoglobin 10.5, HCT 30.6, Platelets 142 Medications reviewed; 1 mg folvite, 1000 mcg vitamin B-12   Diet Order:  Diet Heart Room service appropriate? Yes; Fluid consistency: Thin  Skin:  Reviewed, no issues  Last BM:  1/29  Height:   Ht Readings from Last 1 Encounters:  11/01/16 '6\' 2"'$  (1.88 m)    Weight:   Wt Readings from Last 1 Encounters:  11/01/16 221 lb 12.5 oz (100.6 kg)    Ideal Body Weight:  86.4 kg  BMI:  Body mass index is 28.48 kg/m.  Estimated Nutritional Needs:   Kcal:  2200-2400 (22-24 kcal/kg)  Protein:  125-135 grams  Fluid:  >/= 2 L / d  EDUCATION NEEDS:   Education needs no appropriate at this time  [employer] Ranger Dietetic Intern

## 2016-11-03 DIAGNOSIS — J9601 Acute respiratory failure with hypoxia: Secondary | ICD-10-CM

## 2016-11-03 LAB — CBC AND DIFFERENTIAL
HEMATOCRIT: 30 % — AB (ref 41–53)
HEMOGLOBIN: 10.1 g/dL — AB (ref 13.5–17.5)
Platelets: 125 10*3/uL — AB (ref 150–399)
WBC: 3.2 10^3/mL

## 2016-11-03 LAB — CBC WITH DIFFERENTIAL/PLATELET
BASOS ABS: 0 10*3/uL (ref 0.0–0.1)
Basophils Relative: 0 %
EOS ABS: 0.1 10*3/uL (ref 0.0–0.7)
EOS PCT: 2 %
HCT: 29.9 % — ABNORMAL LOW (ref 39.0–52.0)
Hemoglobin: 10.1 g/dL — ABNORMAL LOW (ref 13.0–17.0)
Lymphocytes Relative: 13 %
Lymphs Abs: 0.4 10*3/uL — ABNORMAL LOW (ref 0.7–4.0)
MCH: 29.8 pg (ref 26.0–34.0)
MCHC: 33.8 g/dL (ref 30.0–36.0)
MCV: 88.2 fL (ref 78.0–100.0)
Monocytes Absolute: 0.3 10*3/uL (ref 0.1–1.0)
Monocytes Relative: 10 %
Neutro Abs: 2.4 10*3/uL (ref 1.7–7.7)
Neutrophils Relative %: 75 %
PLATELETS: 125 10*3/uL — AB (ref 150–400)
RBC: 3.39 MIL/uL — AB (ref 4.22–5.81)
RDW: 13.7 % (ref 11.5–15.5)
WBC: 3.2 10*3/uL — AB (ref 4.0–10.5)

## 2016-11-03 LAB — COMPREHENSIVE METABOLIC PANEL
ALT: 16 U/L — AB (ref 17–63)
AST: 37 U/L (ref 15–41)
Albumin: 2.7 g/dL — ABNORMAL LOW (ref 3.5–5.0)
Alkaline Phosphatase: 40 U/L (ref 38–126)
Anion gap: 8 (ref 5–15)
BUN: 17 mg/dL (ref 6–20)
CO2: 20 mmol/L — AB (ref 22–32)
CREATININE: 1.33 mg/dL — AB (ref 0.61–1.24)
Calcium: 8.4 mg/dL — ABNORMAL LOW (ref 8.9–10.3)
Chloride: 108 mmol/L (ref 101–111)
GFR calc non Af Amer: 50 mL/min — ABNORMAL LOW (ref >60)
GFR, EST AFRICAN AMERICAN: 57 mL/min — AB (ref >60)
Glucose, Bld: 96 mg/dL (ref 65–99)
POTASSIUM: 3.3 mmol/L — AB (ref 3.5–5.1)
SODIUM: 136 mmol/L (ref 135–145)
Total Bilirubin: 0.6 mg/dL (ref 0.3–1.2)
Total Protein: 5.7 g/dL — ABNORMAL LOW (ref 6.5–8.1)

## 2016-11-03 LAB — HEPATIC FUNCTION PANEL
ALK PHOS: 40 U/L (ref 25–125)
ALT: 16 U/L (ref 10–40)
AST: 37 U/L (ref 14–40)
Bilirubin, Total: 0.6 mg/dL

## 2016-11-03 MED ORDER — IBUPROFEN 200 MG PO TABS
400.0000 mg | ORAL_TABLET | Freq: Four times a day (QID) | ORAL | Status: AC | PRN
  Administered 2016-11-03 – 2016-11-05 (×3): 400 mg via ORAL
  Filled 2016-11-03 (×3): qty 2

## 2016-11-03 MED ORDER — POTASSIUM CHLORIDE CRYS ER 20 MEQ PO TBCR
40.0000 meq | EXTENDED_RELEASE_TABLET | Freq: Once | ORAL | Status: AC
  Administered 2016-11-03: 40 meq via ORAL
  Filled 2016-11-03: qty 2

## 2016-11-03 MED ORDER — SODIUM CHLORIDE 0.9 % IV BOLUS (SEPSIS)
1000.0000 mL | Freq: Once | INTRAVENOUS | Status: AC
  Administered 2016-11-03: 1000 mL via INTRAVENOUS

## 2016-11-03 NOTE — Progress Notes (Signed)
PROGRESS NOTE  Matthew Berger:[id number] DOB: [birth year] DOA: 11/01/2016 PCP: [name], MD  HPI/Recap of past 24 hours:  Continue spike fever  Report overall feeling better, No more running nose, he report cough is chronic at baseline Sitting in chair,   Assessment/Plan: Active Problems:   HTN (hypertension)   BPH (benign prostatic hyperplasia)   GERD (gastroesophageal reflux disease)   Non-small cell lung cancer (NSCLC) (HCC)   Hypoxia   HCAP (healthcare-associated pneumonia)   Acute respiratory failure (HCC)   Protein-calorie malnutrition, severe  Fever, still spiking fever, though clinically she looks better, tumor fever?  Influenza b: tamilfu , supportive care   Pneumonia  D/c vanc continue cefepime  Hypokalemia, replace k  Acute on chronic hypoxic respiratory failure - Asymptomatic on nasal cannula at bedtime only, currently hypoxic on 5 L nasal cannula, most likely related to above.   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   Chronic Diastolic CHF - with last ECHO 11/2015 with stable EF 60% and grade I diastolic CHF - Appears to be dry, continue with gentle hydration  Hypertension - Blood pressure soft, reduce metoprolol dose,  hold Cardizem.  BPH - Continue with home medication  Non-small cell lung cancer - immunotherapy on Hold by oncology Dr. Earlie Server due to acute infection. CT chest concerning for cancer progression  DVT Prophylaxis lovenox  Code Status: full  Family Communication: patient and son at bedside  Disposition Plan: pending   Consultants:  none  Procedures:  none  Antibiotics:  vanc from admission to 2/1  Cefepime from admission   Objective: BP (!)  137/53   Pulse 86   Temp (!) 102 F (38.9 C) (Oral)   Resp (!) 27   Ht '6\' 2"'$  (1.88 m)   Wt 100.6 kg (221 lb 12.5 oz)   SpO2 91%   BMI 28.48 kg/m   Intake/Output Summary (Last 24 hours) at 11/03/16 1311 Last data filed at 11/03/16 1200  Gross per 24 hour  Intake             2100 ml  Output             1275 ml  Net              825 ml   Filed Weights   11/01/16 1826  Weight: 100.6 kg (221 lb 12.5 oz)    Exam:   General:  NAD  Cardiovascular: RRR  Respiratory: CTABL  Abdomen: Soft/ND/NT, positive BS  Musculoskeletal: No Edema  Neuro: aaox3  Data Reviewed: Basic Metabolic Panel:  Recent Labs Lab 11/01/16 0913 11/01/16 1102 11/01/16 1107 11/02/16 0348 11/03/16 0409  NA 140 138 140 137 136  K 3.6 3.6 3.7 3.3* 3.3*  CL  --  106 106 106 108  CO2 24 24  --  24 20*  GLUCOSE 136 127* 127* 109* 96  BUN 15.'8 17 16 16 17  '$ CREATININE 1.4* 1.51* 1.30* 1.41* 1.33*  CALCIUM 9.7 9.2  --  8.5* 8.4*   Liver Function Tests:  Recent Labs Lab 11/01/16 0913 11/01/16 1102 11/03/16 0409  AST 23 31 37  ALT 13 15* 16*  ALKPHOS 70 54 40  BILITOT 0.39 0.7 0.6  PROT 7.6 7.7 5.7*  ALBUMIN 3.3* 3.5 2.7*   No results for input(s):  LIPASE, AMYLASE in the last 168 hours. No results for input(s): AMMONIA in the last 168 hours. CBC:  Recent Labs Lab 11/01/16 0913 11/01/16 1102 11/01/16 1107 11/02/16 0348 11/03/16 0409  WBC 5.7 5.5  --  4.7 3.2*  NEUTROABS 4.4 4.7  --   --  2.4  HGB 12.6* 12.6* 13.3 10.5* 10.1*  HCT 37.6* 37.1* 39.0 30.6* 29.9*  MCV 89.1 88.1  --  88.2 88.2  PLT 164 175  --  142* 125*   Cardiac Enzymes:   No results for input(s): CKTOTAL, CKMB, CKMBINDEX, TROPONINI in the last 168 hours. BNP (last 3 results)  Recent Labs  06/28/16 0946  BNP 123.7*    ProBNP (last 3 results) No results for input(s): PROBNP in the last 8760 hours.  CBG: No results for input(s): GLUCAP in the last 168 hours.  Recent Results (from the past 240 hour(s))    Blood Culture (routine x 2)     Status: None (Preliminary result)   Collection Time: 11/01/16 10:54 AM  Result Value Ref Range Status   Specimen Description BLOOD RIGHT ANTECUBITAL  Final   Special Requests BOTTLES DRAWN AEROBIC AND ANAEROBIC 5CC  Final   Culture   Final    NO GROWTH 1 DAY Performed at [facility], [address] [place], [place] [address]    Report Status PENDING  Incomplete  Blood Culture (routine x 2)     Status: None (Preliminary result)   Collection Time: 11/01/16 10:58 AM  Result Value Ref Range Status   Specimen Description BLOOD LEFT ANTECUBITAL  Final   Special Requests BOTTLES DRAWN AEROBIC AND ANAEROBIC 5ML  Final   Culture   Final    NO GROWTH 1 DAY Performed at [facility], [place] [address] [place], [place] [address]    Report Status PENDING  Incomplete  Urine culture     Status: None   Collection Time: 11/01/16  3:45 PM  Result Value Ref Range Status   Specimen Description URINE, RANDOM  Final   Special Requests NONE  Final   Culture   Final    NO GROWTH Performed at [facility], [place] [address] [place], [place] [address]    Report Status 11/02/2016 FINAL  Final  MRSA PCR Screening     Status: None   Collection Time: 11/01/16  6:38 PM  Result Value Ref Range Status   MRSA by PCR NEGATIVE NEGATIVE Final    Comment:        The GeneXpert MRSA Assay (FDA approved for NASAL specimens only), is one component of a comprehensive MRSA colonization surveillance program. It is not intended to diagnose MRSA infection nor to guide or monitor treatment for MRSA infections.      Studies: No results found.  Scheduled Meds: . amiodarone  200 mg Oral Daily  . aspirin EC  81 mg Oral Daily  . ceFEPime (MAXIPIME) IV  1 g Intravenous Q8H  . dorzolamide  1 drop Left Eye BID  . enoxaparin (LOVENOX) injection  30 mg Subcutaneous Q24H  . feeding supplement (ENSURE ENLIVE)  237 mL Oral TID BM  . finasteride  5 mg Oral Daily  .  fluticasone  1 spray Each Nare Daily  . folic acid  1 mg Oral Daily  . guaiFENesin  600 mg Oral BID  . ipratropium-albuterol  3 mL Nebulization TID  . latanoprost  1 drop Both Eyes QHS  . metoprolol  12.5 mg Oral BID  .  oseltamivir  75 mg Oral BID  . tamsulosin  0.4 mg Oral Daily  . vitamin B-12  1,000 mcg Oral Daily    Continuous Infusions: . sodium chloride 75 mL/hr at 11/03/16 0700     Time spent: 1mns  Elizbeth Posa MD, PhD  [facility] Pager [phone] If 7PM-7AM, please contact night-coverage at [email or web address], password [email or web address]2/11/2016, 1:11 PM  LOS: 2 days

## 2016-11-03 NOTE — Progress Notes (Signed)
Pt BP 88/44 with a MAP of 55, radial pulses +1, pedals not palpable. Paged and received fluid bolus orders, currently infusing. Will continue to monitor.

## 2016-11-03 NOTE — Progress Notes (Signed)
Pt transferred to 1403.

## 2016-11-04 ENCOUNTER — Inpatient Hospital Stay (HOSPITAL_COMMUNITY): Payer: Medicare Other

## 2016-11-04 DIAGNOSIS — A419 Sepsis, unspecified organism: Secondary | ICD-10-CM

## 2016-11-04 LAB — COMPREHENSIVE METABOLIC PANEL
ALBUMIN: 3 g/dL — AB (ref 3.5–5.0)
ALK PHOS: 46 U/L (ref 38–126)
ALT: 20 U/L (ref 17–63)
ANION GAP: 8 (ref 5–15)
AST: 76 U/L — ABNORMAL HIGH (ref 15–41)
BILIRUBIN TOTAL: 0.7 mg/dL (ref 0.3–1.2)
BUN: 14 mg/dL (ref 6–20)
CALCIUM: 8.6 mg/dL — AB (ref 8.9–10.3)
CO2: 22 mmol/L (ref 22–32)
CREATININE: 1.28 mg/dL — AB (ref 0.61–1.24)
Chloride: 105 mmol/L (ref 101–111)
GFR calc Af Amer: >60 mL/min (ref >60)
GFR calc non Af Amer: 52 mL/min — ABNORMAL LOW (ref >60)
GLUCOSE: 128 mg/dL — AB (ref 65–99)
Potassium: 3.4 mmol/L — ABNORMAL LOW (ref 3.5–5.1)
Sodium: 135 mmol/L (ref 135–145)
TOTAL PROTEIN: 6.7 g/dL (ref 6.5–8.1)

## 2016-11-04 LAB — BASIC METABOLIC PANEL
Anion gap: 5 (ref 5–15)
BUN: 14 mg/dL (ref 4–21)
BUN: 16 mg/dL (ref 4–21)
BUN: 16 mg/dL (ref 6–20)
CO2: 24 mmol/L (ref 22–32)
CREATININE: 1.2 mg/dL (ref 0.6–1.3)
CREATININE: 1.3 mg/dL (ref 0.6–1.3)
CREATININE: 1.21 mg/dL (ref 0.61–1.24)
Calcium: 8.3 mg/dL — ABNORMAL LOW (ref 8.9–10.3)
Chloride: 107 mmol/L (ref 101–111)
GFR, EST NON AFRICAN AMERICAN: 56 mL/min — AB (ref >60)
GLUCOSE: 109 mg/dL
Glucose, Bld: 109 mg/dL — ABNORMAL HIGH (ref 65–99)
Glucose: 128 mg/dL
Potassium: 3.4 mmol/L (ref 3.4–5.3)
Potassium: 3.4 mmol/L (ref 3.4–5.3)
Potassium: 3.4 mmol/L — ABNORMAL LOW (ref 3.5–5.1)
SODIUM: 135 mmol/L — AB (ref 137–147)
SODIUM: 136 mmol/L (ref 135–145)
Sodium: 136 mmol/L — AB (ref 137–147)

## 2016-11-04 LAB — URINALYSIS, ROUTINE W REFLEX MICROSCOPIC
Bacteria, UA: NONE SEEN
Bilirubin Urine: NEGATIVE
Glucose, UA: NEGATIVE mg/dL
Ketones, ur: 5 mg/dL — AB
Leukocytes, UA: NEGATIVE
Nitrite: NEGATIVE
PH: 5 (ref 5.0–8.0)
Protein, ur: 100 mg/dL — AB
SPECIFIC GRAVITY, URINE: 1.012 (ref 1.005–1.030)
SQUAMOUS EPITHELIAL / LPF: NONE SEEN

## 2016-11-04 LAB — CBC WITH DIFFERENTIAL/PLATELET
BASOS PCT: 0 %
Basophils Absolute: 0 10*3/uL (ref 0.0–0.1)
EOS ABS: 0 10*3/uL (ref 0.0–0.7)
EOS PCT: 1 %
HCT: 31.7 % — ABNORMAL LOW (ref 39.0–52.0)
Hemoglobin: 10.8 g/dL — ABNORMAL LOW (ref 13.0–17.0)
LYMPHS ABS: 0.4 10*3/uL — AB (ref 0.7–4.0)
Lymphocytes Relative: 16 %
MCH: 29.9 pg (ref 26.0–34.0)
MCHC: 34.1 g/dL (ref 30.0–36.0)
MCV: 87.8 fL (ref 78.0–100.0)
Monocytes Absolute: 0.2 10*3/uL (ref 0.1–1.0)
Monocytes Relative: 7 %
NEUTROS PCT: 75 %
Neutro Abs: 2.1 10*3/uL (ref 1.7–7.7)
PLATELETS: 118 10*3/uL — AB (ref 150–400)
RBC: 3.61 MIL/uL — AB (ref 4.22–5.81)
RDW: 13.9 % (ref 11.5–15.5)
WBC: 2.8 10*3/uL — AB (ref 4.0–10.5)

## 2016-11-04 LAB — CBC AND DIFFERENTIAL
HCT: 32 % — AB (ref 41–53)
HEMOGLOBIN: 10.8 g/dL — AB (ref 13.5–17.5)
PLATELETS: 118 10*3/uL — AB (ref 150–399)
WBC: 2.8 10*3/mL

## 2016-11-04 LAB — MAGNESIUM: MAGNESIUM: 1.5 mg/dL — AB (ref 1.7–2.4)

## 2016-11-04 LAB — PROTIME-INR
INR: 1.1
Prothrombin Time: 14.2 seconds (ref 11.4–15.2)

## 2016-11-04 LAB — APTT: aPTT: 34 seconds (ref 24–36)

## 2016-11-04 LAB — HEPATIC FUNCTION PANEL
ALK PHOS: 46 U/L (ref 25–125)
ALT: 20 U/L (ref 10–40)
AST: 76 U/L — AB (ref 14–40)
Bilirubin, Total: 0.7 mg/dL

## 2016-11-04 LAB — LACTIC ACID, PLASMA
Lactic Acid, Venous: 1.6 mmol/L (ref 0.5–1.9)
Lactic Acid, Venous: 1.6 mmol/L (ref 0.5–1.9)

## 2016-11-04 LAB — PROCALCITONIN: PROCALCITONIN: 0.52 ng/mL

## 2016-11-04 MED ORDER — SODIUM CHLORIDE 0.9 % IJ SOLN
INTRAMUSCULAR | Status: AC
  Filled 2016-11-04: qty 50

## 2016-11-04 MED ORDER — POTASSIUM CHLORIDE CRYS ER 20 MEQ PO TBCR
40.0000 meq | EXTENDED_RELEASE_TABLET | Freq: Once | ORAL | Status: AC
  Administered 2016-11-04: 40 meq via ORAL
  Filled 2016-11-04: qty 2

## 2016-11-04 MED ORDER — IOPAMIDOL (ISOVUE-370) INJECTION 76%
INTRAVENOUS | Status: AC
Start: 2016-11-04 — End: 2016-11-04
  Administered 2016-11-04: 100 mL
  Filled 2016-11-04: qty 100

## 2016-11-04 MED ORDER — ENOXAPARIN SODIUM 40 MG/0.4ML ~~LOC~~ SOLN
40.0000 mg | SUBCUTANEOUS | Status: DC
  Administered 2016-11-05 – 2016-11-08 (×4): 40 mg via SUBCUTANEOUS
  Filled 2016-11-04 (×4): qty 0.4

## 2016-11-04 MED ORDER — MAGNESIUM SULFATE 2 GM/50ML IV SOLN
2.0000 g | Freq: Once | INTRAVENOUS | Status: AC
  Administered 2016-11-04: 2 g via INTRAVENOUS
  Filled 2016-11-04: qty 50

## 2016-11-04 MED ORDER — VANCOMYCIN HCL IN DEXTROSE 750-5 MG/150ML-% IV SOLN
750.0000 mg | Freq: Two times a day (BID) | INTRAVENOUS | Status: DC
  Administered 2016-11-04 – 2016-11-05 (×2): 750 mg via INTRAVENOUS
  Filled 2016-11-04 (×3): qty 150

## 2016-11-04 MED ORDER — ONDANSETRON HCL 4 MG/2ML IJ SOLN
4.0000 mg | Freq: Four times a day (QID) | INTRAMUSCULAR | Status: DC | PRN
  Administered 2016-11-04: 4 mg via INTRAVENOUS
  Filled 2016-11-04: qty 2

## 2016-11-04 NOTE — Progress Notes (Addendum)
PROGRESS NOTE  Matthew Berger WNU:[id number] DOB: 11-08-1935 DOA: 11/01/2016 PCP: [name], MD   Matthew Berger  is a 81 y.o. male, Currently being treated for stage IV lung cancer with metastasis, history of GERD, hypertension, was sent to ED from oncology clinic, he presents with complaints of fever, cough, productive sputum, shortness of breath and flulike symptoms, febrile and tachycardic in ED, so he was sent to ED, patient reports symptoms over last 48 hours, generalized body ache, fever, chills ,cough and runny nose, reports wife had similar symptoms but with diarrhea, in ED chest x-ray with significant left lung opacity, febrile 101.1, tachycardic, received IV vancomycin and Zosyn, hospitalist requested to admit for [facility]. Flu test+, he is started on tamiflu  Keep spiking fever, Clinically not improving,  f/u on repeat culture, CTA chest, ct head,       HPI/Recap of past 24 hours:  Continue spike fever , slight confusion, but able to self correct Family in room   Assessment/Plan: Active Problems:   HTN (hypertension)   BPH (benign prostatic hyperplasia)   GERD (gastroesophageal reflux disease)   Non-small cell lung cancer (NSCLC) (HCC)   Hypoxia   HCAP (healthcare-associated pneumonia)   Acute respiratory failure (HCC)   Protein-calorie malnutrition, severe  Fever, still spiking fever with several days of abx and tamiflu treatment, no diarrhea,  No skin problems. Will repeat blood culture, ua. Sputum sample pending collection. Repeat chest imaging. Start sepsis protocol. If infection work up negative, fever might be from cancer. I have explained this to the patient and family.   Influenza b: tamilfu day 4 on 2/3 , supportive care   Pneumonia  He is started on vanc and cefepime on admission, vanc d/ed due to mrsa screening negative Keep spiking fever, repeat chest imaging on 2/3. Restart vanc  Hypokalemia/hypomagnesemia, replace k/mag  Acute on chronic  hypoxic respiratory failure - Asymptomatic on nasal cannula at bedtime only, currently hypoxic on 5 L nasal cannula, most likely related to above. -CTA ordered on 2/3   Multifocal atrial tachycardia vs afib - Seen in the past by cardiology, initially thought to be having atrial fibrillation during previous admission on 06/2016, recommendation for beta blocker, calcium channel blocker, amiodarone,   calcium channel blocker held since admission giving soft blood pressure. Heart rate controlled on betablocker and amiodarone.   Chronic Diastolic CHF - with last ECHO 11/2015 with stable EF 60% and grade I diastolic CHF - Appears to be dry, continue with gentle hydration  Hypertension - Blood pressure soft, reduce metoprolol dose,  Cardizem held since admission  BPH - Continue with home medication  Non-small cell lung cancer - immunotherapy on Hold by oncology Dr. Earlie Server due to acute infection. Repeat CT chest With slight confusion, will get ct head on 2/3  DVT Prophylaxis lovenox  Code Status: DNR, confirmed with patient with family present   Family Communication: patient and son at bedside  Disposition Plan: pending, clinically not improving   Consultants:  none  Procedures:  none  Antibiotics:  vanc from admission to 2/1  Cefepime from admission   Objective: BP 138/60 (BP Location: Left Arm)   Pulse 93   Temp (!) 100.9 F (38.3 C) (Oral)   Resp (!) 30   Ht '6\' 2"'$  (1.88 m)   Wt 103 kg (227 lb 1.2 oz)   SpO2 96%   BMI 29.15 kg/m   Intake/Output Summary (Last 24 hours) at 11/04/16 1435 Last data filed at 11/04/16 1202  Gross per  24 hour  Intake             2915 ml  Output             2050 ml  Net              865 ml   Filed Weights   11/01/16 1826 11/03/16 2308  Weight: 100.6 kg (221 lb 12.5 oz) 103 kg (227 lb 1.2 oz)    Exam:   General:  Weak,   Cardiovascular: RRR  Respiratory: crackles bilateral lower lobe, no wheezing, no  rhonchi  Abdomen: Soft/ND/NT, positive BS  Musculoskeletal: No Edema  Neuro: aaox3, with slight confusion, but able to self correct  Data Reviewed: Basic Metabolic Panel:  Recent Labs Lab 11/01/16 0913 11/01/16 1102 11/01/16 1107 11/02/16 0348 11/03/16 0409 11/04/16 0643  NA 140 138 140 137 136 136  K 3.6 3.6 3.7 3.3* 3.3* 3.4*  CL  --  106 106 106 108 107  CO2 24 24  --  24 20* 24  GLUCOSE 136 127* 127* 109* 96 109*  BUN 15.'8 17 16 16 17 16  '$ CREATININE 1.4* 1.51* 1.30* 1.41* 1.33* 1.21  CALCIUM 9.7 9.2  --  8.5* 8.4* 8.3*  MG  --   --   --   --   --  1.5*   Liver Function Tests:  Recent Labs Lab 11/01/16 0913 11/01/16 1102 11/03/16 0409  AST 23 31 37  ALT 13 15* 16*  ALKPHOS 70 54 40  BILITOT 0.39 0.7 0.6  PROT 7.6 7.7 5.7*  ALBUMIN 3.3* 3.5 2.7*   No results for input(s): LIPASE, AMYLASE in the last 168 hours. No results for input(s): AMMONIA in the last 168 hours. CBC:  Recent Labs Lab 11/01/16 0913 11/01/16 1102 11/01/16 1107 11/02/16 0348 11/03/16 0409 11/04/16 0643  WBC 5.7 5.5  --  4.7 3.2* 2.8*  NEUTROABS 4.4 4.7  --   --  2.4 2.1  HGB 12.6* 12.6* 13.3 10.5* 10.1* 10.8*  HCT 37.6* 37.1* 39.0 30.6* 29.9* 31.7*  MCV 89.1 88.1  --  88.2 88.2 87.8  PLT 164 175  --  142* 125* 118*   Cardiac Enzymes:   No results for input(s): CKTOTAL, CKMB, CKMBINDEX, TROPONINI in the last 168 hours. BNP (last 3 results)  Recent Labs  06/28/16 0946  BNP 123.7*    ProBNP (last 3 results) No results for input(s): PROBNP in the last 8760 hours.  CBG: No results for input(s): GLUCAP in the last 168 hours.  Recent Results (from the past 240 hour(s))  Blood Culture (routine x 2)     Status: None (Preliminary result)   Collection Time: 11/01/16 10:54 AM  Result Value Ref Range Status   Specimen Description BLOOD RIGHT ANTECUBITAL  Final   Special Requests BOTTLES DRAWN AEROBIC AND ANAEROBIC 5CC  Final   Culture   Final    NO GROWTH 3 DAYS Performed at  [facility], [address], [place], [place] [address]    Report Status PENDING  Incomplete  Blood Culture (routine x 2)     Status: None (Preliminary result)   Collection Time: 11/01/16 10:58 AM  Result Value Ref Range Status   Specimen Description BLOOD LEFT ANTECUBITAL  Final   Special Requests BOTTLES DRAWN AEROBIC AND ANAEROBIC 5ML  Final   Culture   Final    NO GROWTH 3 DAYS Performed at [facility], [place] [address] [place], [place] [address]  Report Status PENDING  Incomplete  Urine culture     Status: None   Collection Time: 11/01/16  3:45 PM  Result Value Ref Range Status   Specimen Description URINE, RANDOM  Final   Special Requests NONE  Final   Culture   Final    NO GROWTH Performed at [facility], [address], [place], [place] [address]    Report Status 11/02/2016 FINAL  Final  MRSA PCR Screening     Status: None   Collection Time: 11/01/16  6:38 PM  Result Value Ref Range Status   MRSA by PCR NEGATIVE NEGATIVE Final    Comment:        The GeneXpert MRSA Assay (FDA approved for NASAL specimens only), is one component of a comprehensive MRSA colonization surveillance program. It is not intended to diagnose MRSA infection nor to guide or monitor treatment for MRSA infections.      Studies: No results found.  Scheduled Meds: . amiodarone  200 mg Oral Daily  . aspirin EC  81 mg Oral Daily  . ceFEPime (MAXIPIME) IV  1 g Intravenous Q8H  . dorzolamide  1 drop Left Eye BID  . [START ON 11/05/2016] enoxaparin (LOVENOX) injection  40 mg Subcutaneous Q24H  . feeding supplement (ENSURE ENLIVE)  237 mL Oral TID BM  . finasteride  5 mg Oral Daily  . fluticasone  1 spray Each Nare Daily  . folic acid  1 mg Oral Daily  . guaiFENesin  600 mg Oral BID  . ipratropium-albuterol  3 mL Nebulization TID  . latanoprost  1 drop Both Eyes QHS  . metoprolol  12.5 mg Oral BID  . oseltamivir  75 mg Oral BID  . tamsulosin  0.4 mg Oral Daily  .  vitamin B-12  1,000 mcg Oral Daily    Continuous Infusions: . sodium chloride 75 mL/hr at 11/04/16 1247     Time spent: 63mns  Ayisha Pol MD, PhD  [facility] Pager [phone] If 7PM-7AM, please contact night-coverage at [email or web address], password [email or web address]2/11/2016, 2:35 PM  LOS: 3 days

## 2016-11-04 NOTE — Progress Notes (Signed)
Pharmacy Antibiotic Follow-up Note  Matthew Berger is a 81 y.o. year-old male admitted on 11/01/2016.  The patient is currently on day 4 of abx originally ordered for PNA.  Vancomycin and Cefepime begun 1/31, Vancomycin discontinued 2/1 with negative MRSA PCR.  Fevers continue, Influenza PCR positive, on Tamiflu.  Continue Cefepime, resume Vancomycin  Assessment/Plan: Vancomycin 1gm + 1gm for total 2gm loading dose, then '750mg'$   IV every 12 hours.  Goal trough 15-20 mcg/mL. Cefepime 2gm x1, then 1gm q8hr Tamiflu '75mg'$  bid x 5 days  Temp (24hrs), Avg:100.5 F (38.1 C), Min:98.3 F (36.8 C), Max:103 F (39.4 C)   Recent Labs Lab 11/01/16 0913 11/01/16 1102 11/02/16 0348 11/03/16 0409 11/04/16 0643  WBC 5.7 5.5 4.7 3.2* 2.8*     Recent Labs Lab 11/01/16 1102 11/01/16 1107 11/02/16 0348 11/03/16 0409 11/04/16 0643  CREATININE 1.51* 1.30* 1.41* 1.33* 1.21   Estimated Creatinine Clearance: 64.4 mL/min (by C-G formula based on SCr of 1.21 mg/dL).    No Known Allergies  Antimicrobials this admission: 1/31 Vanc >> 2/1, Resumed 2/3 >> 1/31 Cefepime >>  1/31 Tamiflu >> 5 days (CrCl borderline, continue '75mg'$   bid  Levels/dose changes this admission: 1/31 BCx: ngtd 1/31 UCx: NGF  1/31 Flu PCR: positive 1/31 Strep pneumo: neg 1/31 MRSA PCR neg 2/3 plan repeat BCx:   Microbiology results:  Thank you for allowing pharmacy to be a part of this patient's care.  Minda Ditto PharmD Pager [phone] 11/04/2016, 2:47 PM

## 2016-11-05 DIAGNOSIS — D72819 Decreased white blood cell count, unspecified: Secondary | ICD-10-CM

## 2016-11-05 DIAGNOSIS — J918 Pleural effusion in other conditions classified elsewhere: Secondary | ICD-10-CM

## 2016-11-05 DIAGNOSIS — D696 Thrombocytopenia, unspecified: Secondary | ICD-10-CM

## 2016-11-05 LAB — MAGNESIUM: MAGNESIUM: 1.8 mg/dL (ref 1.7–2.4)

## 2016-11-05 LAB — COMPREHENSIVE METABOLIC PANEL
ALT: 22 U/L (ref 17–63)
AST: 87 U/L — ABNORMAL HIGH (ref 15–41)
Albumin: 2.6 g/dL — ABNORMAL LOW (ref 3.5–5.0)
Alkaline Phosphatase: 42 U/L (ref 38–126)
Anion gap: 6 (ref 5–15)
BUN: 13 mg/dL (ref 6–20)
CHLORIDE: 106 mmol/L (ref 101–111)
CO2: 22 mmol/L (ref 22–32)
CREATININE: 1.2 mg/dL (ref 0.61–1.24)
Calcium: 8.2 mg/dL — ABNORMAL LOW (ref 8.9–10.3)
GFR, EST NON AFRICAN AMERICAN: 56 mL/min — AB (ref >60)
Glucose, Bld: 136 mg/dL — ABNORMAL HIGH (ref 65–99)
Potassium: 3.3 mmol/L — ABNORMAL LOW (ref 3.5–5.1)
Sodium: 134 mmol/L — ABNORMAL LOW (ref 135–145)
Total Bilirubin: 0.4 mg/dL (ref 0.3–1.2)
Total Protein: 6 g/dL — ABNORMAL LOW (ref 6.5–8.1)

## 2016-11-05 LAB — LACTIC ACID, PLASMA: LACTIC ACID, VENOUS: 1.1 mmol/L (ref 0.5–1.9)

## 2016-11-05 LAB — BASIC METABOLIC PANEL
BUN: 13 mg/dL (ref 4–21)
CREATININE: 1.2 mg/dL (ref 0.6–1.3)
Glucose: 136 mg/dL
POTASSIUM: 3.3 mmol/L — AB (ref 3.4–5.3)
Sodium: 134 mmol/L — AB (ref 137–147)

## 2016-11-05 LAB — CBC AND DIFFERENTIAL
HCT: 33 % — AB (ref 41–53)
HEMOGLOBIN: 11.3 g/dL — AB (ref 13.5–17.5)
Platelets: 100 10*3/uL — AB (ref 150–399)
WBC: 3.5 10^3/mL

## 2016-11-05 LAB — CBC
HCT: 32.7 % — ABNORMAL LOW (ref 39.0–52.0)
Hemoglobin: 11.3 g/dL — ABNORMAL LOW (ref 13.0–17.0)
MCH: 29.3 pg (ref 26.0–34.0)
MCHC: 34.6 g/dL (ref 30.0–36.0)
MCV: 84.7 fL (ref 78.0–100.0)
PLATELETS: 100 10*3/uL — AB (ref 150–400)
RBC: 3.86 MIL/uL — AB (ref 4.22–5.81)
RDW: 13.9 % (ref 11.5–15.5)
WBC: 3.5 10*3/uL — ABNORMAL LOW (ref 4.0–10.5)

## 2016-11-05 LAB — EXPECTORATED SPUTUM ASSESSMENT W GRAM STAIN, RFLX TO RESP C

## 2016-11-05 LAB — EXPECTORATED SPUTUM ASSESSMENT W REFEX TO RESP CULTURE

## 2016-11-05 LAB — HEPATIC FUNCTION PANEL: BILIRUBIN, TOTAL: 0.4 mg/dL

## 2016-11-05 MED ORDER — SODIUM CHLORIDE 0.9 % IV BOLUS (SEPSIS)
500.0000 mL | Freq: Once | INTRAVENOUS | Status: AC
  Administered 2016-11-05: 500 mL via INTRAVENOUS

## 2016-11-05 MED ORDER — LINEZOLID 600 MG/300ML IV SOLN
600.0000 mg | Freq: Two times a day (BID) | INTRAVENOUS | Status: DC
  Administered 2016-11-05 – 2016-11-07 (×4): 600 mg via INTRAVENOUS
  Filled 2016-11-05 (×5): qty 300

## 2016-11-05 MED ORDER — MAGNESIUM OXIDE 400 (241.3 MG) MG PO TABS
400.0000 mg | ORAL_TABLET | Freq: Two times a day (BID) | ORAL | Status: AC
  Administered 2016-11-05 – 2016-11-06 (×3): 400 mg via ORAL
  Filled 2016-11-05 (×3): qty 1

## 2016-11-05 MED ORDER — MEROPENEM 1 G IV SOLR
2.0000 g | Freq: Once | INTRAVENOUS | Status: AC
  Administered 2016-11-05: 2 g via INTRAVENOUS
  Filled 2016-11-05: qty 2

## 2016-11-05 MED ORDER — SODIUM CHLORIDE 0.9 % IV SOLN
1.0000 g | Freq: Three times a day (TID) | INTRAVENOUS | Status: DC
  Administered 2016-11-06 – 2016-11-07 (×5): 1 g via INTRAVENOUS
  Filled 2016-11-05 (×6): qty 1

## 2016-11-05 MED ORDER — POTASSIUM CHLORIDE CRYS ER 20 MEQ PO TBCR
20.0000 meq | EXTENDED_RELEASE_TABLET | Freq: Two times a day (BID) | ORAL | Status: AC
  Administered 2016-11-05 – 2016-11-06 (×3): 20 meq via ORAL
  Filled 2016-11-05 (×3): qty 1

## 2016-11-05 NOTE — Consult Note (Addendum)
[facility]  Date of Admission:  11/01/2016  Date of Consult:  11/05/2016  Reason for Consult: Influenza, Lung CA Referring Physician: Osei-Bonsu  Impression/Recommendation Influenza B Suspect bacterial superinfection Lung CA thrombocytopenia  Will change to merrem/zyvox  Comment- Suspect he has bacterial superinfection of his influenza.  He has been able to decrease his FiO2 but still remains borderline on his O2 (90-92%).  He appears to have a narrow window for failure at this point, before he will need ICU care.  He has mild thrombocytopenia and leukopenia currently, will need to watch this closely on zyvox.   Thank you so much for this interesting consult,   Bobby Rumpf (pager) [phone] [email or web address]  Matthew Berger is an 81 y.o. male.  HPI: 81 yo M with hx of CHF, stage IV lung CA, adm on 1-31 with 48 of fever, cough, productive sputum. He was found in ED to have temp 101. And CXR showing-Improved aeration in the left base although persistent density is noted similar to that seen on prior CT examination. The previously seen right-sided nodules are less well appreciated on the current study.  He was started on vanco/cefepime. His vanco was stopped after his MRSA screen was (-).  He tested positive for Influenza B, was started on tamiflu.   He continued to have fever in hospital, his vanco was restarted on 2-3. he underwent CT chest showing: Significant progression of pulmonary disease with numerous ground-glass nodules throughout upper and lower lobes bilaterally with significant components involving the left lower lobe, lingula/left upper lobe. Bilateral pleural effusions left greater than right.Stable appearance of mediastinal lymph nodes and sclerotic lesion in the T9 vertebral body  BCx from adm ngtd. BCx from 2-3 pending.   Past Medical History:  Diagnosis Date  . Atrial fibrillation ([place])   . CHF (congestive heart  failure) ([place])   . Encounter for antineoplastic chemotherapy 01/05/2016  . Encounter for antineoplastic immunotherapy 07/12/2016  . GERD (gastroesophageal reflux disease)   . Hypercholesteremia   . Hypertension   . Non-small cell lung cancer (NSCLC) ([place]) 11/19/15  . Pneumonia 11/18/2015    Past Surgical History:  Procedure Laterality Date  . CATARACT EXTRACTION Bilateral   . ROTATOR CUFF REPAIR Right   . VIDEO BRONCHOSCOPY Bilateral 11/19/2015   Procedure: VIDEO BRONCHOSCOPY WITH FLUORO;  Surgeon: Rigoberto Noel, MD;  Location: [facility];  Service: Cardiopulmonary;  Laterality: Bilateral;  . WISDOM TOOTH EXTRACTION       No Known Allergies  Medications:  Scheduled: . amiodarone  200 mg Oral Daily  . aspirin EC  81 mg Oral Daily  . ceFEPime (MAXIPIME) IV  1 g Intravenous Q8H  . dorzolamide  1 drop Left Eye BID  . enoxaparin (LOVENOX) injection  40 mg Subcutaneous Q24H  . feeding supplement (ENSURE ENLIVE)  237 mL Oral TID BM  . finasteride  5 mg Oral Daily  . fluticasone  1 spray Each Nare Daily  . folic acid  1 mg Oral Daily  . guaiFENesin  600 mg Oral BID  . ipratropium-albuterol  3 mL Nebulization TID  . latanoprost  1 drop Both Eyes QHS  . magnesium oxide  400 mg Oral BID  . metoprolol  12.5 mg Oral BID  . potassium chloride  20 mEq Oral BID  . tamsulosin  0.4 mg Oral Daily  . vancomycin  750 mg Intravenous Q12H  . vitamin B-12  1,000 mcg Oral Daily    Abtx:  Anti-infectives    Start     Dose/Rate Route Frequency Ordered Stop   11/04/16 1600  vancomycin (VANCOCIN) IVPB 750 mg/150 ml premix     750 mg 150 mL/hr over 60 Minutes Intravenous Every 12 hours 11/04/16 1457     11/02/16 0400  vancomycin (VANCOCIN) IVPB 750 mg/150 ml premix  Status:  Discontinued     750 mg 150 mL/hr over 60 Minutes Intravenous Every 12 hours 11/01/16 1629 11/02/16 1648   11/01/16 2200  ceFEPIme (MAXIPIME) 1 g in dextrose 5 % 50 mL IVPB     1 g 100 mL/hr over 30 Minutes Intravenous Every  8 hours 11/01/16 1455 11/09/16 2159   11/01/16 2000  oseltamivir (TAMIFLU) capsule 75 mg     75 mg Oral 2 times daily 11/01/16 1332 11/05/16 1053   11/01/16 1530  vancomycin (VANCOCIN) IVPB 1000 mg/200 mL premix     1,000 mg 200 mL/hr over 60 Minutes Intravenous  Once 11/01/16 1528 11/01/16 1823   11/01/16 1515  ceFEPIme (MAXIPIME) 2 g in dextrose 5 % 50 mL IVPB     2 g 100 mL/hr over 30 Minutes Intravenous  Once 11/01/16 1502 11/01/16 1723   11/01/16 1300  oseltamivir (TAMIFLU) capsule 75 mg     75 mg Oral Once 11/01/16 1246 11/01/16 1302   11/01/16 1030  vancomycin (VANCOCIN) IVPB 1000 mg/200 mL premix     1,000 mg 200 mL/hr over 60 Minutes Intravenous  Once 11/01/16 1024 11/01/16 1539   11/01/16 1030  piperacillin-tazobactam (ZOSYN) IVPB 3.375 g     3.375 g 100 mL/hr over 30 Minutes Intravenous  Once 11/01/16 1024 11/01/16 1203      Total days of antibiotics: 5 cefepime/vanco/tamiflu Off vanco 2-1 to 2-3.           Social History:  reports that he quit smoking about 26 years ago. His smoking use included Cigarettes. He has a 80.00 pack-year smoking history. He has never used smokeless tobacco. He reports that he does not drink alcohol or use drugs.  Family History  Problem Relation Age of Onset  . Breast cancer Sister   . Stroke Sister   . Colon cancer Brother   . Cancer Maternal Aunt     x2  . Hypertension Mother   . Stroke Mother   . Breast cancer Cousin   . Diabetes Other   . Hypertension Other   . Hyperlipidemia Other     General ROS: +SOB, +cough, no dysuria. normal BM. +anorexia. +fever.  Please see HPI. 12 point ROS o/w (-)  Blood pressure (!) 109/57, pulse 72, temperature 100.3 F (37.9 C), temperature source Oral, resp. rate (!) 26, height '6\' 2"'$  (1.88 m), weight 103 kg (227 lb 1.2 oz), SpO2 90 %. General appearance: fatigued and no distress Eyes: negative findings: conjunctivae and sclerae normal and pupils equal, round, reactive to light and  accomodation Throat: abnormal findings: mild pharyngeal d/c.  Neck: no adenopathy, supple, symmetrical, trachea midline and FROM, no tenderness.  Lungs: diminished breath sounds bilaterally and rhonchi bilaterally and mild Heart: regular rate and rhythm Abdomen: normal findings: bowel sounds normal and soft, non-tender Extremities: edema none Skin: no lesions.    Results for orders placed or performed during the hospital encounter of 11/01/16 (from the past 48 hour(s))  CBC with Differential/Platelet     Status: Abnormal   Collection Time: 11/04/16  6:43 AM  Result Value Ref Range   WBC 2.8 (L) 4.0 - 10.5 K/uL  RBC 3.61 (L) 4.22 - 5.81 MIL/uL   Hemoglobin 10.8 (L) 13.0 - 17.0 g/dL   HCT 31.7 (L) 39.0 - 52.0 %   MCV 87.8 78.0 - 100.0 fL   MCH 29.9 26.0 - 34.0 pg   MCHC 34.1 30.0 - 36.0 g/dL   RDW 13.9 11.5 - 15.5 %   Platelets 118 (L) 150 - 400 K/uL    Comment: RESULT REPEATED AND VERIFIED SPECIMEN CHECKED FOR CLOTS CONSISTENT WITH PREVIOUS RESULT    Neutrophils Relative % 75 %   Neutro Abs 2.1 1.7 - 7.7 K/uL   Lymphocytes Relative 16 %   Lymphs Abs 0.4 (L) 0.7 - 4.0 K/uL   Monocytes Relative 7 %   Monocytes Absolute 0.2 0.1 - 1.0 K/uL   Eosinophils Relative 1 %   Eosinophils Absolute 0.0 0.0 - 0.7 K/uL   Basophils Relative 0 %   Basophils Absolute 0.0 0.0 - 0.1 K/uL  Basic metabolic panel     Status: Abnormal   Collection Time: 11/04/16  6:43 AM  Result Value Ref Range   Sodium 136 135 - 145 mmol/L   Potassium 3.4 (L) 3.5 - 5.1 mmol/L   Chloride 107 101 - 111 mmol/L   CO2 24 22 - 32 mmol/L   Glucose, Bld 109 (H) 65 - 99 mg/dL   BUN 16 6 - 20 mg/dL   Creatinine, Ser 1.21 0.61 - 1.24 mg/dL   Calcium 8.3 (L) 8.9 - 10.3 mg/dL   GFR calc non Af Amer 56 (L) >60 mL/min   GFR calc Af Amer >60 >60 mL/min    Comment: (NOTE) The eGFR has been calculated using the CKD EPI equation. This calculation has not been validated in all clinical situations. eGFR's persistently <60  mL/min signify possible Chronic Kidney Disease.    Anion gap 5 5 - 15  Magnesium     Status: Abnormal   Collection Time: 11/04/16  6:43 AM  Result Value Ref Range   Magnesium 1.5 (L) 1.7 - 2.4 mg/dL  Culture, blood (routine x 2)     Status: None (Preliminary result)   Collection Time: 11/04/16  3:11 PM  Result Value Ref Range   Specimen Description BLOOD RIGHT ANTECUBITAL    Special Requests BOTTLES DRAWN AEROBIC AND ANAEROBIC 5 CC EACH    Culture      NO GROWTH < 24 HOURS Performed at [facility], [place] [address], [place], [place] [address]    Report Status PENDING   Culture, blood (routine x 2)     Status: None (Preliminary result)   Collection Time: 11/04/16  3:11 PM  Result Value Ref Range   Specimen Description BLOOD LEFT ANTECUBITAL    Special Requests IN PEDIATRIC BOTTLE 1.5CC    Culture      NO GROWTH < 24 HOURS Performed at [facility], [place] [address], [place], [place] [address]    Report Status PENDING   Comprehensive metabolic panel     Status: Abnormal   Collection Time: 11/04/16  3:11 PM  Result Value Ref Range   Sodium 135 135 - 145 mmol/L   Potassium 3.4 (L) 3.5 - 5.1 mmol/L   Chloride 105 101 - 111 mmol/L   CO2 22 22 - 32 mmol/L   Glucose, Bld 128 (H) 65 - 99 mg/dL   BUN 14 6 - 20 mg/dL   Creatinine, Ser 1.28 (H) 0.61 - 1.24 mg/dL   Calcium 8.6 (L) 8.9 - 10.3 mg/dL   Total  Protein 6.7 6.5 - 8.1 g/dL   Albumin 3.0 (L) 3.5 - 5.0 g/dL   AST 76 (H) 15 - 41 U/L   ALT 20 17 - 63 U/L   Alkaline Phosphatase 46 38 - 126 U/L   Total Bilirubin 0.7 0.3 - 1.2 mg/dL   GFR calc non Af Amer 52 (L) >60 mL/min   GFR calc Af Amer >60 >60 mL/min    Comment: (NOTE) The eGFR has been calculated using the CKD EPI equation. This calculation has not been validated in all clinical situations. eGFR's persistently <60 mL/min signify possible Chronic Kidney Disease.    Anion gap 8 5 - 15  Lactic acid, plasma     Status: None   Collection Time: 11/04/16  3:11 PM   Result Value Ref Range   Lactic Acid, Venous 1.6 0.5 - 1.9 mmol/L  Procalcitonin     Status: None   Collection Time: 11/04/16  3:11 PM  Result Value Ref Range   Procalcitonin 0.52 ng/mL    Comment:        Interpretation: PCT > 0.5 ng/mL and <= 2 ng/mL: Systemic infection (sepsis) is possible, but other conditions are known to elevate PCT as well. (NOTE)         ICU PCT Algorithm               Non ICU PCT Algorithm    ----------------------------     ------------------------------         PCT < 0.25 ng/mL                 PCT < 0.1 ng/mL     Stopping of antibiotics            Stopping of antibiotics       strongly encouraged.               strongly encouraged.    ----------------------------     ------------------------------       PCT level decrease by               PCT < 0.25 ng/mL       >= 80% from peak PCT       OR PCT 0.25 - 0.5 ng/mL          Stopping of antibiotics                                             encouraged.     Stopping of antibiotics           encouraged.    ----------------------------     ------------------------------       PCT level decrease by              PCT >= 0.25 ng/mL       < 80% from peak PCT        AND PCT >= 0.5 ng/mL             Continuing antibiotics                                              encouraged.       Continuing antibiotics            encouraged.    ----------------------------     ------------------------------  PCT level increase compared          PCT > 0.5 ng/mL         with peak PCT AND          PCT >= 0.5 ng/mL             Escalation of antibiotics                                          strongly encouraged.      Escalation of antibiotics        strongly encouraged.   Protime-INR     Status: None   Collection Time: 11/04/16  3:11 PM  Result Value Ref Range   Prothrombin Time 14.2 11.4 - 15.2 seconds   INR 1.10   APTT     Status: None   Collection Time: 11/04/16  3:11 PM  Result Value Ref Range   aPTT 34 24 - 36  seconds  Urinalysis, Routine w reflex microscopic     Status: Abnormal   Collection Time: 11/04/16  3:58 PM  Result Value Ref Range   Color, Urine YELLOW YELLOW   APPearance CLEAR CLEAR   Specific Gravity, Urine 1.012 1.005 - 1.030   pH 5.0 5.0 - 8.0   Glucose, UA NEGATIVE NEGATIVE mg/dL   Hgb urine dipstick MODERATE (A) NEGATIVE   Bilirubin Urine NEGATIVE NEGATIVE   Ketones, ur 5 (A) NEGATIVE mg/dL   Protein, ur 100 (A) NEGATIVE mg/dL   Nitrite NEGATIVE NEGATIVE   Leukocytes, UA NEGATIVE NEGATIVE   RBC / HPF 0-5 0 - 5 RBC/hpf   WBC, UA 0-5 0 - 5 WBC/hpf   Bacteria, UA NONE SEEN NONE SEEN   Squamous Epithelial / LPF NONE SEEN NONE SEEN   Mucous PRESENT   Lactic acid, plasma     Status: None   Collection Time: 11/04/16  6:54 PM  Result Value Ref Range   Lactic Acid, Venous 1.6 0.5 - 1.9 mmol/L  CBC     Status: Abnormal   Collection Time: 11/05/16  5:42 AM  Result Value Ref Range   WBC 3.5 (L) 4.0 - 10.5 K/uL   RBC 3.86 (L) 4.22 - 5.81 MIL/uL   Hemoglobin 11.3 (L) 13.0 - 17.0 g/dL   HCT 32.7 (L) 39.0 - 52.0 %   MCV 84.7 78.0 - 100.0 fL   MCH 29.3 26.0 - 34.0 pg   MCHC 34.6 30.0 - 36.0 g/dL   RDW 13.9 11.5 - 15.5 %   Platelets 100 (L) 150 - 400 K/uL    Comment: CONSISTENT WITH PREVIOUS RESULT  Comprehensive metabolic panel     Status: Abnormal   Collection Time: 11/05/16  5:42 AM  Result Value Ref Range   Sodium 134 (L) 135 - 145 mmol/L   Potassium 3.3 (L) 3.5 - 5.1 mmol/L   Chloride 106 101 - 111 mmol/L   CO2 22 22 - 32 mmol/L   Glucose, Bld 136 (H) 65 - 99 mg/dL   BUN 13 6 - 20 mg/dL   Creatinine, Ser 1.20 0.61 - 1.24 mg/dL   Calcium 8.2 (L) 8.9 - 10.3 mg/dL   Total Protein 6.0 (L) 6.5 - 8.1 g/dL   Albumin 2.6 (L) 3.5 - 5.0 g/dL   AST 87 (H) 15 - 41 U/L   ALT 22 17 - 63 U/L   Alkaline Phosphatase 42 38 -  126 U/L   Total Bilirubin 0.4 0.3 - 1.2 mg/dL   GFR calc non Af Amer 56 (L) >60 mL/min   GFR calc Af Amer >60 >60 mL/min    Comment: (NOTE) The eGFR has been  calculated using the CKD EPI equation. This calculation has not been validated in all clinical situations. eGFR's persistently <60 mL/min signify possible Chronic Kidney Disease.    Anion gap 6 5 - 15  Magnesium     Status: None   Collection Time: 11/05/16  5:42 AM  Result Value Ref Range   Magnesium 1.8 1.7 - 2.4 mg/dL  Lactic acid, plasma     Status: None   Collection Time: 11/05/16  5:42 AM  Result Value Ref Range   Lactic Acid, Venous 1.1 0.5 - 1.9 mmol/L      Component Value Date/Time   SDES BLOOD RIGHT ANTECUBITAL 11/04/2016 1511   SDES BLOOD LEFT ANTECUBITAL 11/04/2016 1511   SPECREQUEST BOTTLES DRAWN AEROBIC AND ANAEROBIC 5 CC EACH 11/04/2016 1511   SPECREQUEST IN PEDIATRIC BOTTLE 1.5CC 11/04/2016 1511   CULT  11/04/2016 1511    NO GROWTH < 24 HOURS Performed at [facility], [place] [address], [place], [place] [address]    CULT  11/04/2016 1511    NO GROWTH < 24 HOURS Performed at [facility], [place] [address] [place], [place] [address]    REPTSTATUS PENDING 11/04/2016 1511   REPTSTATUS PENDING 11/04/2016 1511   Ct Head Wo Contrast  Result Date: 11/04/2016 CLINICAL DATA:  Altered mental status.  History of lung cancer. EXAM: CT HEAD WITHOUT CONTRAST TECHNIQUE: Contiguous axial images were obtained from the base of the skull through the vertex without intravenous contrast. COMPARISON:  Brain MRI dated 12/17/2015. FINDINGS: Brain: There is mild generalized age related parenchymal atrophy with commensurate dilatation of the ventricles and sulci. Mild chronic small vessel ischemic change noted within the deep periventricular white matter regions bilaterally. There is no mass, hemorrhage, edema or other evidence of acute parenchymal abnormality. No extra-axial hemorrhage. Vascular: There are chronic calcified atherosclerotic changes of the large vessels at the skull base. No unexpected hyperdense vessel. Skull: Normal. Negative for fracture or focal lesion.  Sinuses/Orbits: No acute finding. Other: None. IMPRESSION: 1. No acute findings.  No intracranial mass, hemorrhage or edema. 2. Mild atrophy and chronic ischemic changes in the white matter. Electronically Signed   By: Franki Cabot M.D.   On: 11/04/2016 18:16   Ct Angio Chest Pe W Or Wo Contrast  Result Date: 11/04/2016 CLINICAL DATA:  Positive for flu. History of lung cancer. Shortness of breath with low oxygen. EXAM: CT ANGIOGRAPHY CHEST WITH CONTRAST TECHNIQUE: Multidetector CT imaging of the chest was performed using the standard protocol during bolus administration of intravenous contrast. Multiplanar CT image reconstructions and MIPs were obtained to evaluate the vascular anatomy. CONTRAST:  100 cc Isovue 370 COMPARISON:  Chest x-ray 11/04/2016, chest CT 06/29/2016 FINDINGS: Cardiovascular: Coronary artery calcifications are present. No pericardial effusion. There is atherosclerotic calcification of the thoracic aorta not associated with aneurysm. Pulmonary arteries are only moderately well opacified by contrast bolus. No evidence for acute pulmonary emboli in the main pulmonary arteries and larger branches. Smaller pulmonary emboli would not be detectable due to artifacts and poor bolus. Mediastinum/Nodes: The visualized portion of the thyroid gland has a normal appearance. Small mediastinal lymph nodes are present, stable in appearance. Soft tissue in the precarinal/subcarinal regions appear stable with discrete nodes difficult to measure. Largest is estimated  to measure approximately 11 mm greatest diameter. The esophagus is normal in appearance. Small hiatal hernia. Lungs/Pleura: There are bilateral pleural effusions, left greater than right. Consolidation in the left lower lobe persists with air bronchograms. This process is contiguous with numerous nodular opacities throughout the left lower lobe and extending into the lingula /left upper lobe. Within the right lung there are more numerous  ground-glass opacities, now too numerous to count. Index lesion in the right upper lobe previously measured 1.6 cm and currently measures 2.5 cm on image 51 of series 10. Upper Abdomen: The gallbladder is present.  No acute abnormality. Musculoskeletal: Sclerotic lesion within T9 appears stable. No new lesions or evidence for acute fracture. Review of the MIP images confirms the above findings. IMPRESSION: 1. Significant progression of pulmonary disease. Numerous ground-glass nodules are now identified throughout upper and lower lobes bilaterally with significant components involving the left lower lobe, lingula/left upper lobe. 2. Bilateral pleural effusions left greater than right. 3. Quality of the exam is limited to exclusion of pulmonary emboli within the main pulmonary arteries and segmental branches. Smaller pulmonary emboli would be very difficult to exclude. 4. Coronary artery disease. 5. Stable appearance of mediastinal lymph nodes. 6. Stable appearance of sclerotic lesion in the T9 vertebral body. Electronically Signed   By: Nolon Nations M.D.   On: 11/04/2016 18:27   Dg Chest Port 1 View  Result Date: 11/04/2016 CLINICAL DATA:  Sepsis.  History of lung cancer. EXAM: PORTABLE CHEST 1 VIEW COMPARISON:  CT scan September 15, 2016 and chest x-ray November 01, 2016 FINDINGS: The patient's known pulmonary nodules are not as well appreciated on this study compared to the recent CT scan. Vague densities in the central right lung likely represent residual nodules. The opacity in the left mid to lower lung is similar in the interval. Left retrocardiac opacity is stable. No pneumothorax or other change. IMPRESSION: Mild improvement on the right and no significant interval change on the left. Electronically Signed   By: Dorise Bullion III M.D   On: 11/04/2016 15:37   Recent Results (from the past 240 hour(s))  Blood Culture (routine x 2)     Status: None (Preliminary result)   Collection Time: 11/01/16  10:54 AM  Result Value Ref Range Status   Specimen Description BLOOD RIGHT ANTECUBITAL  Final   Special Requests BOTTLES DRAWN AEROBIC AND ANAEROBIC 5CC  Final   Culture   Final    NO GROWTH 4 DAYS Performed at [facility], [address], [place], [place] [address]    Report Status PENDING  Incomplete  Blood Culture (routine x 2)     Status: None (Preliminary result)   Collection Time: 11/01/16 10:58 AM  Result Value Ref Range Status   Specimen Description BLOOD LEFT ANTECUBITAL  Final   Special Requests BOTTLES DRAWN AEROBIC AND ANAEROBIC 5ML  Final   Culture   Final    NO GROWTH 4 DAYS Performed at [facility], [place] [address] [place], [place] [address]    Report Status PENDING  Incomplete  Urine culture     Status: None   Collection Time: 11/01/16  3:45 PM  Result Value Ref Range Status   Specimen Description URINE, RANDOM  Final   Special Requests NONE  Final   Culture   Final    NO GROWTH Performed at [facility], [place] [address], [place], [place] [address]    Report Status 11/02/2016 FINAL  Final  MRSA PCR Screening     Status: None   Collection Time: 11/01/16  6:38 PM  Result Value Ref Range Status   MRSA by PCR NEGATIVE NEGATIVE Final    Comment:        The GeneXpert MRSA Assay (FDA approved for NASAL specimens only), is one component of a comprehensive MRSA colonization surveillance program. It is not intended to diagnose MRSA infection nor to guide or monitor treatment for MRSA infections.   Culture, blood (routine x 2)     Status: None (Preliminary result)   Collection Time: 11/04/16  3:11 PM  Result Value Ref Range Status   Specimen Description BLOOD RIGHT ANTECUBITAL  Final   Special Requests BOTTLES DRAWN AEROBIC AND ANAEROBIC 5 CC EACH  Final   Culture   Final    NO GROWTH < 24 HOURS Performed at [facility], [place] [address] [place], [place] [address]    Report Status PENDING  Incomplete  Culture, blood (routine x 2)      Status: None (Preliminary result)   Collection Time: 11/04/16  3:11 PM  Result Value Ref Range Status   Specimen Description BLOOD LEFT ANTECUBITAL  Final   Special Requests IN PEDIATRIC BOTTLE 1.5CC  Final   Culture   Final    NO GROWTH < 24 HOURS Performed at [facility], [place] [address] [place], [place] [address]    Report Status PENDING  Incomplete      11/05/2016, 2:49 PM     LOS: 4 days    Records and images were personally reviewed where available.

## 2016-11-05 NOTE — Progress Notes (Signed)
Pharmacy Antibiotic Follow-up Note  Matthew Berger is a 81 y.o. year-old male admitted on 11/01/2016.  The patient is currently on day 4 of abx originally ordered for PNA.  Vancomycin and Cefepime begun 1/31, Vancomycin discontinued 2/1 with negative MRSA PCR.  Fevers continue, Influenza PCR positive, on Tamiflu.  Continue Cefepime, resume Vancomycin  Today, 11/05/2016 Per ID, adjusting coverage to Zyvox/Merrem  Assessment/Plan:  Merrem 2g IV x 1, then 1g IV q8h  Zyvox '600mg'$  IV q12h per MD - monitor platelets closely  Follow up renal function & cultures  Temp (24hrs), Avg:101.3 F (38.5 C), Min:99.4 F (37.4 C), Max:103.2 F (39.6 C)   Recent Labs Lab 11/01/16 1102 11/02/16 0348 11/03/16 0409 11/04/16 0643 11/05/16 0542  WBC 5.5 4.7 3.2* 2.8* 3.5*     Recent Labs Lab 11/02/16 0348 11/03/16 0409 11/04/16 0643 11/04/16 1511 11/05/16 0542  CREATININE 1.41* 1.33* 1.21 1.28* 1.20   Estimated Creatinine Clearance: 64.9 mL/min (by C-G formula based on SCr of 1.2 mg/dL).    No Known Allergies  Antimicrobials this admission: 1/31 Vanc >> 2/1, Resumed 2/3 >> 2/4 1/31 Cefepime >> 2/4 1/31 Tamiflu >> 2/4 2/4 Zyvox >> 2/4 Merrem >>  Levels/dose changes this admission:  Microbiology results: 1/31 BCx: ngtd 1/31 UCx: NGF  1/31 Sputum: sent  1/31 Flu PCR: positive 1/31 Strep pneumo: neg 1/31 MRSA PCR neg 2/3 Repeat BCx: sent 2/3 UCx: sent  Thank you for allowing pharmacy to be a part of this patient's care.  Peggyann Juba, PharmD, BCPS Pager: [phone] 11/05/2016, 3:23 PM

## 2016-11-05 NOTE — Progress Notes (Signed)
PROGRESS NOTE  Matthew Berger JSH:[id number] DOB: 1936/02/10 DOA: 11/01/2016 PCP: [name], MD   Keep spiking fever, Clinically not improving,  f/u on repeat culture, CTA chest, ct head,      Brief narrative:  Matthew Berger  is a 81 y.o. male, Currently being treated for stage IV lung cancer with metastasis, history of GERD, hypertension, who presented to the ED from oncology clinic, with 48 hour history of  fever, cough, productive sputum, shortness of breath and flu-like symptoms.  He was hypoxic, febrile and tachycardic in ED with chest x-ray with significant for left lung opacity. He received IV vancomycin and Zosyn, and admitted for for [facility] Flu test+, he was started on tamiflu  Assessment/Plan: Active Problems:   HTN (hypertension)   BPH (benign prostatic hyperplasia)   GERD (gastroesophageal reflux disease)   Non-small cell lung cancer (NSCLC) (HCC)   Hypoxia   HCAP (healthcare-associated pneumonia)   Acute respiratory failure (HCC)   Protein-calorie malnutrition, severe  Fever: Continues to have intermittent fevers despite appropriate antimicrobial treatment. 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 Heart rate controlled on betablocker and amiodarone.   Chronic Diastolic CHF - with last ECHO 11/2015 with stable EF 60% and grade I diastolic  CHF - Appears to be dry, continue with gentle hydration  Hypertension - Blood pressure soft, reduce metoprolol dose,  Cardizem held since admission  BPH - Continue with home medication  Non-small cell lung cancer mmunotherapy on Hold by oncology Dr. Earlie Server due to acute infection.  Repeat CT chest 11/04/16 - Significant progression of pulmonary disease with numerous ground-glass nodules throughout upper and lower lobes bilaterally with significant components involving the left lower lobe, lingula/left upper lobe. Bilateral pleural effusions left greater than right.Stable appearance of mediastinal lymph nodes and sclerotic lesion in the T9 vertebral body  DVT Prophylaxis lovenox  Code Status: DNR, confirmed with patient with family present   Family Communication: patient and son at bedside  Disposition Plan: pending, clinically not improving   Consultants:  none  Procedures:  none  Antibiotics:  vanc from admission to 2/1  Cefepime from admission   Objective: Feels weak but a little better, (+) fever  BP 135/61 (BP Location: Left Arm)   Pulse (!) 112   Temp 100.3 F (37.9 C) (Oral)   Resp (!) 24   Ht '6\' 2"'$  (1.88 m)   Wt 103 kg (227 lb 1.2 oz)   SpO2 93%   BMI 29.15 kg/m   Intake/Output Summary (Last 24 hours) at 11/05/16 1411 Last data filed at 11/05/16 1000  Gross per 24 hour  Intake             1800 ml  Output             1850 ml  Net              -50 ml   Filed Weights   11/01/16 1826 11/03/16 2308  Weight: 100.6 kg (221 lb 12.5 oz) 103 kg (227 lb 1.2 oz)    Exam:   General:  Weak,   Cardiovascular: RRR  Respiratory: crackles bilateral lower lobe, no wheezing, no rhonchi  Abdomen: Soft/ND/NT, positive BS  Musculoskeletal: No Edema  Neuro: aaox3  Data Reviewed: Basic Metabolic Panel:  Recent Labs Lab 11/02/16 0348 11/03/16 0409 11/04/16 0643 11/04/16 1511 11/05/16 0542  NA 137 136 136 135 134*  K 3.3* 3.3* 3.4* 3.4* 3.3*   CL 106 108 107 105 106  CO2 24 20* '24 22 22  '$ GLUCOSE 109* 96 109* 128* 136*  BUN '16 17 16 14 13  '$ CREATININE 1.41* 1.33* 1.21 1.28* 1.20  CALCIUM 8.5* 8.4* 8.3* 8.6* 8.2*  MG  --   --  1.5*  --  1.8   Liver Function Tests:  Recent Labs Lab 11/01/16 0913 11/01/16 1102 11/03/16 0409 11/04/16 1511 11/05/16 0542  AST 23 31 37 76* 87*  ALT 13 15* 16* 20 22  ALKPHOS 70 54 40 46 42  BILITOT 0.39 0.7 0.6 0.7 0.4  PROT 7.6 7.7 5.7* 6.7 6.0*  ALBUMIN 3.3* 3.5 2.7* 3.0* 2.6*   No results for input(s): LIPASE, AMYLASE in the last 168 hours. No results for input(s): AMMONIA in the last 168 hours. CBC:  Recent Labs Lab 11/01/16 0913  11/01/16 1102 11/01/16 1107 11/02/16 0348 11/03/16 0409 11/04/16 0643 11/05/16 0542  WBC 5.7  --  5.5  --  4.7 3.2* 2.8* 3.5*  NEUTROABS 4.4  --  4.7  --   --  2.4 2.1  --   HGB 12.6*  < > 12.6* 13.3 10.5* 10.1* 10.8* 11.3*  HCT 37.6*  < > 37.1* 39.0 30.6* 29.9* 31.7* 32.7*  MCV 89.1  --  88.1  --  88.2 88.2 87.8 84.7  PLT 164  --  175  --  142* 125* 118* 100*  < > = values in this interval not displayed. Cardiac Enzymes:   No results for input(s): CKTOTAL, CKMB, CKMBINDEX, TROPONINI in the last 168 hours. BNP (last 3 results)  Recent Labs  06/28/16 0946  BNP 123.7*    ProBNP (last 3 results) No results for input(s): PROBNP in the last 8760 hours.  CBG: No results for input(s): GLUCAP in the last 168 hours.  Recent Results (from the past 240 hour(s))  Blood Culture (routine x 2)     Status: None (Preliminary result)   Collection Time: 11/01/16 10:54 AM  Result Value Ref Range Status   Specimen Description BLOOD RIGHT ANTECUBITAL  Final   Special Requests BOTTLES DRAWN AEROBIC AND ANAEROBIC 5CC  Final   Culture   Final    NO GROWTH 4 DAYS Performed at [facility], [address], [place], [place] [address]    Report Status PENDING  Incomplete  Blood Culture (routine x 2)     Status: None (Preliminary result)   Collection  Time: 11/01/16 10:58 AM  Result Value Ref Range Status   Specimen Description BLOOD LEFT ANTECUBITAL  Final   Special Requests BOTTLES DRAWN AEROBIC AND ANAEROBIC 5ML  Final   Culture   Final    NO GROWTH 4 DAYS Performed at [facility], [place] [address], [place], [place] [address]    Report Status PENDING  Incomplete  Urine culture     Status: None   Collection Time: 11/01/16  3:45 PM  Result Value Ref Range Status   Specimen Description URINE, RANDOM  Final  Special Requests NONE  Final   Culture   Final    NO GROWTH Performed at [facility], [place] [address] [place], [place] [address]    Report Status 11/02/2016 FINAL  Final  MRSA PCR Screening     Status: None   Collection Time: 11/01/16  6:38 PM  Result Value Ref Range Status   MRSA by PCR NEGATIVE NEGATIVE Final    Comment:        The GeneXpert MRSA Assay (FDA approved for NASAL specimens only), is one component of a comprehensive MRSA colonization surveillance program. It is not intended to diagnose MRSA infection nor to guide or monitor treatment for MRSA infections.   Culture, blood (routine x 2)     Status: None (Preliminary result)   Collection Time: 11/04/16  3:11 PM  Result Value Ref Range Status   Specimen Description BLOOD RIGHT ANTECUBITAL  Final   Special Requests BOTTLES DRAWN AEROBIC AND ANAEROBIC 5 CC EACH  Final   Culture   Final    NO GROWTH < 24 HOURS Performed at [facility], [place] [address] [place], [place] [address]    Report Status PENDING  Incomplete  Culture, blood (routine x 2)     Status: None (Preliminary result)   Collection Time: 11/04/16  3:11 PM  Result Value Ref Range Status   Specimen Description BLOOD LEFT ANTECUBITAL  Final   Special Requests IN PEDIATRIC BOTTLE 1.5CC  Final   Culture   Final    NO GROWTH < 24 HOURS Performed at [facility], [place] [address] [place], [place] [address]    Report Status PENDING  Incomplete     Studies: Ct Head Wo  Contrast  Result Date: 11/04/2016 CLINICAL DATA:  Altered mental status.  History of lung cancer. EXAM: CT HEAD WITHOUT CONTRAST TECHNIQUE: Contiguous axial images were obtained from the base of the skull through the vertex without intravenous contrast. COMPARISON:  Brain MRI dated 12/17/2015. FINDINGS: Brain: There is mild generalized age related parenchymal atrophy with commensurate dilatation of the ventricles and sulci. Mild chronic small vessel ischemic change noted within the deep periventricular white matter regions bilaterally. There is no mass, hemorrhage, edema or other evidence of acute parenchymal abnormality. No extra-axial hemorrhage. Vascular: There are chronic calcified atherosclerotic changes of the large vessels at the skull base. No unexpected hyperdense vessel. Skull: Normal. Negative for fracture or focal lesion. Sinuses/Orbits: No acute finding. Other: None. IMPRESSION: 1. No acute findings.  No intracranial mass, hemorrhage or edema. 2. Mild atrophy and chronic ischemic changes in the white matter. Electronically Signed   By: Franki Cabot M.D.   On: 11/04/2016 18:16   Ct Angio Chest Pe W Or Wo Contrast  Result Date: 11/04/2016 CLINICAL DATA:  Positive for flu. History of lung cancer. Shortness of breath with low oxygen. EXAM: CT ANGIOGRAPHY CHEST WITH CONTRAST TECHNIQUE: Multidetector CT imaging of the chest was performed using the standard protocol during bolus administration of intravenous contrast. Multiplanar CT image reconstructions and MIPs were obtained to evaluate the vascular anatomy. CONTRAST:  100 cc Isovue 370 COMPARISON:  Chest x-ray 11/04/2016, chest CT 06/29/2016 FINDINGS: Cardiovascular: Coronary artery calcifications are present. No pericardial effusion. There is atherosclerotic calcification of the thoracic aorta not associated with aneurysm. Pulmonary arteries are only moderately well opacified by contrast bolus. No evidence for acute pulmonary emboli in the main  pulmonary arteries and larger branches. Smaller pulmonary emboli would not be detectable due to artifacts and  poor bolus. Mediastinum/Nodes: The visualized portion of the thyroid gland has a normal appearance. Small mediastinal lymph nodes are present, stable in appearance. Soft tissue in the precarinal/subcarinal regions appear stable with discrete nodes difficult to measure. Largest is estimated to measure approximately 11 mm greatest diameter. The esophagus is normal in appearance. Small hiatal hernia. Lungs/Pleura: There are bilateral pleural effusions, left greater than right. Consolidation in the left lower lobe persists with air bronchograms. This process is contiguous with numerous nodular opacities throughout the left lower lobe and extending into the lingula /left upper lobe. Within the right lung there are more numerous ground-glass opacities, now too numerous to count. Index lesion in the right upper lobe previously measured 1.6 cm and currently measures 2.5 cm on image 51 of series 10. Upper Abdomen: The gallbladder is present.  No acute abnormality. Musculoskeletal: Sclerotic lesion within T9 appears stable. No new lesions or evidence for acute fracture. Review of the MIP images confirms the above findings. IMPRESSION: 1. Significant progression of pulmonary disease. Numerous ground-glass nodules are now identified throughout upper and lower lobes bilaterally with significant components involving the left lower lobe, lingula/left upper lobe. 2. Bilateral pleural effusions left greater than right. 3. Quality of the exam is limited to exclusion of pulmonary emboli within the main pulmonary arteries and segmental branches. Smaller pulmonary emboli would be very difficult to exclude. 4. Coronary artery disease. 5. Stable appearance of mediastinal lymph nodes. 6. Stable appearance of sclerotic lesion in the T9 vertebral body. Electronically Signed   By: Nolon Nations M.D.   On: 11/04/2016 18:27   Dg  Chest Port 1 View  Result Date: 11/04/2016 CLINICAL DATA:  Sepsis.  History of lung cancer. EXAM: PORTABLE CHEST 1 VIEW COMPARISON:  CT scan September 15, 2016 and chest x-ray November 01, 2016 FINDINGS: The patient's known pulmonary nodules are not as well appreciated on this study compared to the recent CT scan. Vague densities in the central right lung likely represent residual nodules. The opacity in the left mid to lower lung is similar in the interval. Left retrocardiac opacity is stable. No pneumothorax or other change. IMPRESSION: Mild improvement on the right and no significant interval change on the left. Electronically Signed   By: Dorise Bullion III M.D   On: 11/04/2016 15:37    Scheduled Meds: . amiodarone  200 mg Oral Daily  . aspirin EC  81 mg Oral Daily  . ceFEPime (MAXIPIME) IV  1 g Intravenous Q8H  . dorzolamide  1 drop Left Eye BID  . enoxaparin (LOVENOX) injection  40 mg Subcutaneous Q24H  . feeding supplement (ENSURE ENLIVE)  237 mL Oral TID BM  . finasteride  5 mg Oral Daily  . fluticasone  1 spray Each Nare Daily  . folic acid  1 mg Oral Daily  . guaiFENesin  600 mg Oral BID  . ipratropium-albuterol  3 mL Nebulization TID  . latanoprost  1 drop Both Eyes QHS  . metoprolol  12.5 mg Oral BID  . tamsulosin  0.4 mg Oral Daily  . vancomycin  750 mg Intravenous Q12H  . vitamin B-12  1,000 mcg Oral Daily    Continuous Infusions: . sodium chloride 75 mL/hr at 11/05/16 1237     Time spent: 22mns  OSEI-BONSU,Annalynn Centanni MD  [facility] Pager [phone] If 7PM-7AM, please contact night-coverage at [email or web address], password [facility]2/12/2016, 2:11 PM  LOS: 4 days

## 2016-11-06 ENCOUNTER — Telehealth: Payer: Self-pay | Admitting: *Deleted

## 2016-11-06 DIAGNOSIS — Z809 Family history of malignant neoplasm, unspecified: Secondary | ICD-10-CM

## 2016-11-06 DIAGNOSIS — Z8249 Family history of ischemic heart disease and other diseases of the circulatory system: Secondary | ICD-10-CM

## 2016-11-06 DIAGNOSIS — Z87891 Personal history of nicotine dependence: Secondary | ICD-10-CM

## 2016-11-06 DIAGNOSIS — Z800 Family history of malignant neoplasm of digestive organs: Secondary | ICD-10-CM

## 2016-11-06 DIAGNOSIS — J1001 Influenza due to other identified influenza virus with the same other identified influenza virus pneumonia: Secondary | ICD-10-CM

## 2016-11-06 DIAGNOSIS — J101 Influenza due to other identified influenza virus with other respiratory manifestations: Secondary | ICD-10-CM

## 2016-11-06 DIAGNOSIS — Z8349 Family history of other endocrine, nutritional and metabolic diseases: Secondary | ICD-10-CM

## 2016-11-06 DIAGNOSIS — B9789 Other viral agents as the cause of diseases classified elsewhere: Secondary | ICD-10-CM

## 2016-11-06 DIAGNOSIS — Z803 Family history of malignant neoplasm of breast: Secondary | ICD-10-CM

## 2016-11-06 DIAGNOSIS — Z823 Family history of stroke: Secondary | ICD-10-CM

## 2016-11-06 DIAGNOSIS — Z833 Family history of diabetes mellitus: Secondary | ICD-10-CM

## 2016-11-06 LAB — BASIC METABOLIC PANEL
ANION GAP: 7 (ref 5–15)
BUN: 16 mg/dL (ref 4–21)
BUN: 17 mg/dL (ref 6–20)
CALCIUM: 8.1 mg/dL — AB (ref 8.9–10.3)
CHLORIDE: 109 mmol/L (ref 101–111)
CO2: 21 mmol/L — ABNORMAL LOW (ref 22–32)
CREATININE: 1.17 mg/dL (ref 0.61–1.24)
Creatinine: 1 mg/dL (ref 0.6–1.3)
GFR calc non Af Amer: 58 mL/min — ABNORMAL LOW (ref >60)
Glucose, Bld: 99 mg/dL (ref 65–99)
Glucose: 105 mg/dL
POTASSIUM: 3.3 mmol/L — AB (ref 3.4–5.3)
Potassium: 3.8 mmol/L (ref 3.5–5.1)
Sodium: 137 mmol/L (ref 135–145)
Sodium: 138 mmol/L (ref 137–147)

## 2016-11-06 LAB — CULTURE, BLOOD (ROUTINE X 2)
CULTURE: NO GROWTH
Culture: NO GROWTH

## 2016-11-06 LAB — MAGNESIUM: Magnesium: 1.9 mg/dL (ref 1.7–2.4)

## 2016-11-06 LAB — URINE CULTURE: CULTURE: NO GROWTH

## 2016-11-06 MED ORDER — FUROSEMIDE 10 MG/ML IJ SOLN
40.0000 mg | Freq: Two times a day (BID) | INTRAMUSCULAR | Status: AC
  Administered 2016-11-06 (×2): 40 mg via INTRAVENOUS
  Filled 2016-11-06 (×2): qty 4

## 2016-11-06 NOTE — Care Management Important Message (Signed)
Important Message  Patient Details IM Letter given to Kathy/Case Manager to present to Patient Name: Matthew Berger MRN: [id number] Date of Birth: 1936-04-01   Medicare Important Message Given:  Yes    Kerin Salen 11/06/2016, 11:10 AMImportant Message  Patient Details  Name: Matthew Berger MRN: [id number] Date of Birth: 1936/01/16   Medicare Important Message Given:  Yes    Kerin Salen 11/06/2016, 11:10 AM

## 2016-11-06 NOTE — Progress Notes (Signed)
PROGRESS NOTE  Matthew Berger PJK:[id number] DOB: 12-13-1935 DOA: 11/01/2016 PCP: [name], MD  Brief narrative:  Matthew Berger  is a 81 y.o. male, Currently being treated for stage IV lung cancer with metastasis, history of GERD, hypertension, who presented to the ED from oncology clinic, with 48 hour history of  fever, cough, productive sputum, shortness of breath and flu-like symptoms. FLu positive, on Tamiflu and continues to have Fevers on Broad spectrum Abx, ID consulted  Assessment/Plan:  Fever: Continues to have intermittent fevers despite Empiric Vanc/Cefepime and Tamiflu -? Tumor fever , Secondary bacterial infection -Blood Cx negative -ID consulted, now on Zyvox and Merrem  Influenza b: tamilfu -s/p 5days yesterday  HCAP/secondary bacterial infection after Flu -s/p 5days of On vanc and cefepime, now Abx changed as bove -CTA  on 2/3 indicates some progression of pulmonary disease, no PE  Multifocal atrial tachycardia vs afib Seen in the past by cardiology, initially thought to be having atrial fibrillation during previous admission on 06/2016, recommendation for beta blocker, calcium channel blocker, amiodarone,   calcium channel blocker held since admission giving soft blood pressure. -Heart rate controlled on betablocker and amiodarone.  Acute on Chronic Diastolic CHF - with last ECHO 11/2015 with stable EF 60% and grade I diastolic CHF - stop IVF, lasix today  Hypokalemia/hypomagnesemia replete k/mag  Hypertension - Blood pressure soft, reduce metoprolol dose,  Cardizem held since admission -resume in next 1-2days  BPH - Continue with Flomax  Non-small cell lung cancer Immunotherapy on Hold by oncology Dr. Earlie Server due to acute infection. -Repeat CT chest 11/04/16 - Significant progression of pulmonary disease with numerous ground-glass nodules throughout upper and lower lobes bilaterally with significant components involving the left lower lobe,  lingula/left upper lobe. Bilateral pleural effusions left greater than right.Stable appearance of mediastinal lymph nodes and sclerotic lesion in the T9 vertebral body -per Dr.Mohamed  DVT Prophylaxis lovenox  Code Status: DNR Family Communication: patient and son at bedside Disposition Plan: pending clinical improvement   Consultants:  ID and Dr.Mohamed  Procedures:  none  Antibiotics:  vanc from admission to 2/1-2/4  Cefepime 2/1-2/4  Zyvox/Merrem 2/4->   Objective: feels ok, febrile last pm, breathing ok  BP (!) 107/57 (BP Location: Left Arm)   Pulse 79   Temp 98.4 F (36.9 C) (Oral)   Resp 20   Ht '6\' 2"'$  (1.88 m)   Wt 103 kg (227 lb 1.2 oz)   SpO2 94%   BMI 29.15 kg/m   Intake/Output Summary (Last 24 hours) at 11/06/16 0812 Last data filed at 11/06/16 0600  Gross per 24 hour  Intake          2618.75 ml  Output              475 ml  Net          2143.75 ml   Filed Weights   11/01/16 1826 11/03/16 2308  Weight: 100.6 kg (221 lb 12.5 oz) 103 kg (227 lb 1.2 oz)    Exam:   General:  Weak, AAOx3, no distress  Cardiovascular: RRR  Respiratory: crackles bilateral lower lobe, no wheezing, no rhonchi  Abdomen: Soft/ND/NT, positive BS  Ext: no edema   Data Reviewed: Basic Metabolic Panel:  Recent Labs Lab 11/03/16 0409 11/04/16 0643 11/04/16 1511 11/05/16 0542 11/06/16 0447  NA 136 136 135 134* 137  K 3.3* 3.4* 3.4* 3.3* 3.8  CL 108 107 105 106 109  CO2 20* '24 22 22 '$ 21*  GLUCOSE 96  109* 128* 136* 99  BUN '17 16 14 13 17  '$ CREATININE 1.33* 1.21 1.28* 1.20 1.17  CALCIUM 8.4* 8.3* 8.6* 8.2* 8.1*  MG  --  1.5*  --  1.8 1.9   Liver Function Tests:  Recent Labs Lab 11/01/16 0913 11/01/16 1102 11/03/16 0409 11/04/16 1511 11/05/16 0542  AST 23 31 37 76* 87*  ALT 13 15* 16* 20 22  ALKPHOS 70 54 40 46 42  BILITOT 0.39 0.7 0.6 0.7 0.4  PROT 7.6 7.7 5.7* 6.7 6.0*  ALBUMIN 3.3* 3.5 2.7* 3.0* 2.6*   No results for input(s): LIPASE, AMYLASE  in the last 168 hours. No results for input(s): AMMONIA in the last 168 hours. CBC:  Recent Labs Lab 11/01/16 0913  11/01/16 1102 11/01/16 1107 11/02/16 0348 11/03/16 0409 11/04/16 0643 11/05/16 0542  WBC 5.7  --  5.5  --  4.7 3.2* 2.8* 3.5*  NEUTROABS 4.4  --  4.7  --   --  2.4 2.1  --   HGB 12.6*  < > 12.6* 13.3 10.5* 10.1* 10.8* 11.3*  HCT 37.6*  < > 37.1* 39.0 30.6* 29.9* 31.7* 32.7*  MCV 89.1  --  88.1  --  88.2 88.2 87.8 84.7  PLT 164  --  175  --  142* 125* 118* 100*  < > = values in this interval not displayed. Cardiac Enzymes:   No results for input(s): CKTOTAL, CKMB, CKMBINDEX, TROPONINI in the last 168 hours. BNP (last 3 results)  Recent Labs  06/28/16 0946  BNP 123.7*    ProBNP (last 3 results) No results for input(s): PROBNP in the last 8760 hours.  CBG: No results for input(s): GLUCAP in the last 168 hours.  Recent Results (from the past 240 hour(s))  Blood Culture (routine x 2)     Status: None (Preliminary result)   Collection Time: 11/01/16 10:54 AM  Result Value Ref Range Status   Specimen Description BLOOD RIGHT ANTECUBITAL  Final   Special Requests BOTTLES DRAWN AEROBIC AND ANAEROBIC 5CC  Final   Culture   Final    NO GROWTH 4 DAYS Performed at [facility], [address], [place], [place] [address]    Report Status PENDING  Incomplete  Blood Culture (routine x 2)     Status: None (Preliminary result)   Collection Time: 11/01/16 10:58 AM  Result Value Ref Range Status   Specimen Description BLOOD LEFT ANTECUBITAL  Final   Special Requests BOTTLES DRAWN AEROBIC AND ANAEROBIC 5ML  Final   Culture   Final    NO GROWTH 4 DAYS Performed at [facility], [place] [address] [place], [place] [address]    Report Status PENDING  Incomplete  Urine culture     Status: None   Collection Time: 11/01/16  3:45 PM  Result Value Ref Range Status   Specimen Description URINE, RANDOM  Final   Special Requests NONE  Final   Culture   Final    NO  GROWTH Performed at [facility], [place] [address] [place], [place] [address]    Report Status 11/02/2016 FINAL  Final  MRSA PCR Screening     Status: None   Collection Time: 11/01/16  6:38 PM  Result Value Ref Range Status   MRSA by PCR NEGATIVE NEGATIVE Final    Comment:        The GeneXpert MRSA Assay (FDA approved for NASAL specimens only), is one component of a comprehensive MRSA colonization surveillance  program. It is not intended to diagnose MRSA infection nor to guide or monitor treatment for MRSA infections.   Culture, blood (routine x 2)     Status: None (Preliminary result)   Collection Time: 11/04/16  3:11 PM  Result Value Ref Range Status   Specimen Description BLOOD RIGHT ANTECUBITAL  Final   Special Requests BOTTLES DRAWN AEROBIC AND ANAEROBIC 5 CC EACH  Final   Culture   Final    NO GROWTH < 24 HOURS Performed at [facility], [place] [address] [place], [place] [address]    Report Status PENDING  Incomplete  Culture, blood (routine x 2)     Status: None (Preliminary result)   Collection Time: 11/04/16  3:11 PM  Result Value Ref Range Status   Specimen Description BLOOD LEFT ANTECUBITAL  Final   Special Requests IN PEDIATRIC BOTTLE 1.5CC  Final   Culture   Final    NO GROWTH < 24 HOURS Performed at [facility], [place] [address] [place], [place] [address]    Report Status PENDING  Incomplete  Culture, sputum-assessment     Status: None   Collection Time: 11/05/16 10:20 PM  Result Value Ref Range Status   Specimen Description SPUTUM  Final   Special Requests Immunocompromised  Final   Sputum evaluation THIS SPECIMEN IS ACCEPTABLE FOR SPUTUM CULTURE  Final   Report Status 11/05/2016 FINAL  Final     Studies: No results found.  Scheduled Meds: . amiodarone  200 mg Oral Daily  . aspirin EC  81 mg Oral Daily  . dorzolamide  1 drop Left Eye BID  . enoxaparin (LOVENOX) injection  40 mg Subcutaneous Q24H  . feeding supplement (ENSURE  ENLIVE)  237 mL Oral TID BM  . finasteride  5 mg Oral Daily  . fluticasone  1 spray Each Nare Daily  . folic acid  1 mg Oral Daily  . furosemide  40 mg Intravenous BID  . guaiFENesin  600 mg Oral BID  . ipratropium-albuterol  3 mL Nebulization TID  . latanoprost  1 drop Both Eyes QHS  . linezolid (ZYVOX) IV  600 mg Intravenous Q12H  . magnesium oxide  400 mg Oral BID  . meropenem (MERREM) IV  1 g Intravenous Q8H  . metoprolol  12.5 mg Oral BID  . potassium chloride  20 mEq Oral BID  . tamsulosin  0.4 mg Oral Daily  . vitamin B-12  1,000 mcg Oral Daily    Continuous Infusions:    Time spent: 47mns  Akiah Bauch MD  [facility] Pager [phone] If 7PM-7AM, please contact night-coverage at [email or web address], password [email or web address]2/01/2017, 8:12 AM  LOS: 5 days

## 2016-11-06 NOTE — Progress Notes (Signed)
RN paged Schorr about change in mentation, confusion, difficulty following commands. No new orders. Will continue to monitor.

## 2016-11-06 NOTE — Telephone Encounter (Signed)
Per 1/31 LOS and staff message I have scheduled appts and notified the scheduler

## 2016-11-06 NOTE — Progress Notes (Signed)
Paged Schorr about Temp and increased oxygen requirement. IV bolus ordered and tylenol given. Will continue to monitor.

## 2016-11-06 NOTE — Progress Notes (Signed)
[place] [place] for Infectious Disease  Date of Admission:  11/01/2016   Total days of antibiotics 6        Day 2 linezolid        Day 2 meropenem         Principal Problem:   Influenza B Active Problems:   HCAP (healthcare-associated pneumonia)   HTN (hypertension)   BPH (benign prostatic hyperplasia)   GERD (gastroesophageal reflux disease)   Non-small cell lung cancer (NSCLC) (HCC)   Hypoxia   Acute respiratory failure (HCC)   Protein-calorie malnutrition, severe   . amiodarone  200 mg Oral Daily  . aspirin EC  81 mg Oral Daily  . dorzolamide  1 drop Left Eye BID  . enoxaparin (LOVENOX) injection  40 mg Subcutaneous Q24H  . feeding supplement (ENSURE ENLIVE)  237 mL Oral TID BM  . finasteride  5 mg Oral Daily  . fluticasone  1 spray Each Nare Daily  . folic acid  1 mg Oral Daily  . furosemide  40 mg Intravenous BID  . guaiFENesin  600 mg Oral BID  . ipratropium-albuterol  3 mL Nebulization TID  . latanoprost  1 drop Both Eyes QHS  . linezolid (ZYVOX) IV  600 mg Intravenous Q12H  . meropenem (MERREM) IV  1 g Intravenous Q8H  . metoprolol  12.5 mg Oral BID  . tamsulosin  0.4 mg Oral Daily  . vitamin B-12  1,000 mcg Oral Daily    SUBJECTIVE: He is feeling a little better today. He is not coughing as much.  Review of Systems: Review of Systems  Constitutional: Positive for fever and malaise/fatigue. Negative for chills and diaphoresis.  Respiratory: Positive for cough and shortness of breath. Negative for sputum production.   Cardiovascular: Negative for chest pain.    Past Medical History:  Diagnosis Date  . Atrial fibrillation ([place])   . CHF (congestive heart failure) ([place])   . Encounter for antineoplastic chemotherapy 01/05/2016  . Encounter for antineoplastic immunotherapy 07/12/2016  . GERD (gastroesophageal reflux disease)   . Hypercholesteremia   . Hypertension   . Non-small cell lung cancer (NSCLC) ([place]) 11/19/15  . Pneumonia 11/18/2015     Social History  Substance Use Topics  . Smoking status: Former Smoker    Packs/day: 2.00    Years: 40.00    Types: Cigarettes    Quit date: 10/02/1990  . Smokeless tobacco: Never Used     Comment: smoked 1-2 ppd.   . Alcohol use No    Family History  Problem Relation Age of Onset  . Breast cancer Sister   . Stroke Sister   . Colon cancer Brother   . Cancer Maternal Aunt     x2  . Hypertension Mother   . Stroke Mother   . Breast cancer Cousin   . Diabetes Other   . Hypertension Other   . Hyperlipidemia Other    No Known Allergies  OBJECTIVE: Vitals:   11/06/16 0902 11/06/16 0921 11/06/16 1239 11/06/16 1402  BP:  (!) 105/58 100/61   Pulse: 72 70 88   Resp: 20  18   Temp:   98.4 F (36.9 C)   TempSrc:   Oral   SpO2: 95%  93% 97%  Weight:      Height:       Body mass index is 29.15 kg/m.  Physical Exam  Constitutional: He is oriented to person, place, and time.  He is resting quietly  in bed watching television.  Cardiovascular:  Distant heart sounds difficult to hear.  Pulmonary/Chest: Effort normal. He has no wheezes. He has rales.  Abdominal: Soft. There is no rebound.  Neurological: He is alert and oriented to person, place, and time.  Skin: No rash noted.  Psychiatric: Mood and affect normal.    Lab Results Lab Results  Component Value Date   WBC 3.5 (L) 11/05/2016   HGB 11.3 (L) 11/05/2016   HCT 32.7 (L) 11/05/2016   MCV 84.7 11/05/2016   PLT 100 (L) 11/05/2016    Lab Results  Component Value Date   CREATININE 1.17 11/06/2016   BUN 17 11/06/2016   NA 137 11/06/2016   K 3.8 11/06/2016   CL 109 11/06/2016   CO2 21 (L) 11/06/2016    Lab Results  Component Value Date   ALT 22 11/05/2016   AST 87 (H) 11/05/2016   ALKPHOS 42 11/05/2016   BILITOT 0.4 11/05/2016     Microbiology: Recent Results (from the past 240 hour(s))  Blood Culture (routine x 2)     Status: None   Collection Time: 11/01/16 10:54 AM  Result Value Ref Range Status    Specimen Description BLOOD RIGHT ANTECUBITAL  Final   Special Requests BOTTLES DRAWN AEROBIC AND ANAEROBIC 5CC  Final   Culture   Final    NO GROWTH 5 DAYS Performed at [facility], [place] [address], [place], [place] [address]    Report Status 11/06/2016 FINAL  Final  Blood Culture (routine x 2)     Status: None   Collection Time: 11/01/16 10:58 AM  Result Value Ref Range Status   Specimen Description BLOOD LEFT ANTECUBITAL  Final   Special Requests BOTTLES DRAWN AEROBIC AND ANAEROBIC 5ML  Final   Culture   Final    NO GROWTH 5 DAYS Performed at [facility], [place] [address] [place], [place] [address]    Report Status 11/06/2016 FINAL  Final  Urine culture     Status: None   Collection Time: 11/01/16  3:45 PM  Result Value Ref Range Status   Specimen Description URINE, RANDOM  Final   Special Requests NONE  Final   Culture   Final    NO GROWTH Performed at [facility], [place] [address], [place], [place] [address]    Report Status 11/02/2016 FINAL  Final  MRSA PCR Screening     Status: None   Collection Time: 11/01/16  6:38 PM  Result Value Ref Range Status   MRSA by PCR NEGATIVE NEGATIVE Final    Comment:        The GeneXpert MRSA Assay (FDA approved for NASAL specimens only), is one component of a comprehensive MRSA colonization surveillance program. It is not intended to diagnose MRSA infection nor to guide or monitor treatment for MRSA infections.   Culture, blood (routine x 2)     Status: None (Preliminary result)   Collection Time: 11/04/16  3:11 PM  Result Value Ref Range Status   Specimen Description BLOOD RIGHT ANTECUBITAL  Final   Special Requests BOTTLES DRAWN AEROBIC AND ANAEROBIC 5 CC EACH  Final   Culture   Final    NO GROWTH 2 DAYS Performed at [facility], [address] [place], [place] [address]    Report Status PENDING  Incomplete  Culture, blood (routine x 2)     Status: None (Preliminary result)   Collection Time:  11/04/16  3:11 PM  Result Value Ref Range Status   Specimen Description BLOOD LEFT ANTECUBITAL  Final   Special Requests IN PEDIATRIC BOTTLE 1.5CC  Final   Culture   Final    NO GROWTH 2 DAYS Performed at [facility], [place] [address], [place], [place]  [address]    Report Status PENDING  Incomplete  Urine culture     Status: None   Collection Time: 11/04/16  3:57 PM  Result Value Ref Range Status   Specimen Description URINE, RANDOM  Final   Special Requests NONE  Final   Culture   Final    NO GROWTH Performed at [facility], [address], [place], [place] [address]    Report Status 11/06/2016 FINAL  Final  Culture, sputum-assessment     Status: None   Collection Time: 11/05/16 10:20 PM  Result Value Ref Range Status   Specimen Description SPUTUM  Final   Special Requests Immunocompromised  Final   Sputum evaluation THIS SPECIMEN IS ACCEPTABLE FOR SPUTUM CULTURE  Final   Report Status 11/05/2016 FINAL  Final  Culture, respiratory (NON-Expectorated)     Status: None (Preliminary result)   Collection Time: 11/05/16 10:20 PM  Result Value Ref Range Status   Specimen Description SPUTUM  Final   Special Requests Immunocompromised Reflexed from [id number]  Final   Gram Stain   Final    MODERATE WBC PRESENT, PREDOMINANTLY PMN ABUNDANT GRAM POSITIVE COCCI IN PAIRS FEW GRAM POSITIVE RODS FEW YEAST Performed at [facility], [place] [address], [place], [place] [address]    Culture PENDING  Incomplete   Report Status PENDING  Incomplete     ASSESSMENT: He seems to be improving slowly. He completed 5 days of also, we are for influenza B. He remains on broad empiric antibiotic therapy for possible superimposed bacterial pneumonia. He is still having intermittent fevers.  PLAN: 1. Continue current antibiotics  Michel Bickers, MD [facility] for Infectious [place] [phone] pager   [phone] cell 11/06/2016, 5:00 PM

## 2016-11-07 DIAGNOSIS — J101 Influenza due to other identified influenza virus with other respiratory manifestations: Secondary | ICD-10-CM

## 2016-11-07 MED ORDER — SODIUM CHLORIDE 0.9 % IV SOLN
INTRAVENOUS | Status: DC
  Administered 2016-11-07: 12:00:00 via INTRAVENOUS

## 2016-11-07 NOTE — Progress Notes (Signed)
Nutrition Follow-up  DOCUMENTATION CODES:   Severe malnutrition in context of acute illness/injury  INTERVENTION:   Continue Ensure Enlive po TID, each supplement provides 350 kcal and 20 grams of protein Will continue to monitor for needs  NUTRITION DIAGNOSIS:   Malnutrition related to acute illness as evidenced by moderate depletion of body fat, moderate depletions of muscle mass, energy intake < or equal to 50% for > or equal to 5 days, percent weight loss.  Ongoing.  GOAL:   Patient will meet greater than or equal to 90% of their needs  Progressing.  MONITOR:   PO intake, Supplement acceptance, Weight trends, I & O's  ASSESSMENT:   81 y.o. male, Currently being treated for stage IV lung cancer with metastasis, history of GERD, hypertension, was sent to ED from oncology clinic, he presents with complaints of fever, cough, productive sputum, shortness of breath and flulike symptoms, febrile and tachycardic in ED, so he was sent to ED, patient reports symptoms over last 48 hours, generalized body ache, fever, chills ,cough and runny nose, reports wife had similar symptoms but with diarrhea, in ED chest x-ray with significant left lung opacity, febrile 101.1, tachycardic, received IV vancomycin and Zosyn, hospitalist requested to admit for [facility].  Patient in room with no family at bedside. Pt states his appetite has improved "a little bit". Pt ate some grits and a little eggs this morning. PO intake: 30%.  He states he did not drink any of his Ensure supplement today. He plans to drink this later. Pt concerned about how much he eats and if he will "keep it down". Pt denies nausea currently. Encouraged small frequent meals if pt is experiencing early satiety.  Medications: folic acid tablet daily, Vitamin B-12 tablet daily Labs reviewed: Mg WNL  Diet Order:  Diet Heart Room service appropriate? Yes; Fluid consistency: Thin  Skin:  Reviewed, no issues  Last BM:  2/3  Height:    Ht Readings from Last 1 Encounters:  11/03/16 '6\' 2"'$  (1.88 m)    Weight:   Wt Readings from Last 1 Encounters:  11/03/16 227 lb 1.2 oz (103 kg)    Ideal Body Weight:  86.4 kg  BMI:  Body mass index is 29.15 kg/m.  Estimated Nutritional Needs:   Kcal:  2200-2400 (22-24 kcal/kg)  Protein:  125-135 grams  Fluid:  >/= 2 L / d  EDUCATION NEEDS:   Education needs no appropriate at this time  Clayton Bibles, MS, RD, LDN Pager: [phone] After Hours Pager: [phone]

## 2016-11-07 NOTE — Progress Notes (Signed)
PROGRESS NOTE  Matthew Berger YBO:[id number] DOB: Apr 22, 1936 DOA: 11/01/2016 PCP: [name], MD  Brief narrative:  Matthew Berger  is a 81 y.o. male, Currently being treated for stage IV lung cancer with metastasis, history of GERD, hypertension, who presented to the ED from oncology clinic, with 48 hour history of  fever, cough, productive sputum, shortness of breath and flu-like symptoms. FLu positive, on Tamiflu and continues to have Fevers on Broad spectrum Abx, ID consulted  Assessment/Plan:  Fever: Continues to have intermittent fevers despite Empiric Vanc/Cefepime and Tamiflu -? Tumor fever , Secondary bacterial infection -Blood Cx negative -ID consulted, for now now on Zyvox and Merrem  Influenza b: tamilfu -s/p 5days ended 11/05/16  HCAP/secondary bacterial infection after Flu -s/p 6 days of On vanc and cefepime, now Abx changed as bove -CTA  on 2/3 indicates some progression of pulmonary disease, no PE  Multifocal atrial tachycardia vs afib Seen in the past by cardiology, initially thought to be having atrial fibrillation during previous admission on 06/2016, recommendation for beta blocker, calcium channel blocker, amiodarone,   calcium channel blocker held since admission giving soft blood pressure. -Heart rate controlled on betablocker and amiodarone.  Acute on Chronic Diastolic CHF - with last ECHO 11/2015 with stable EF 60% and grade I diastolic CHF - stop IVF, lasix '40mg'$  Iv x2 given yesterday. Monitor for now.   Hypokalemia/hypomagnesemia replete k/mag as needed   Hypertension - Blood pressure soft, reduce metoprolol dose,  Cardizem held since admission -resume when appropriate   BPH - Continue with Flomax  Non-small cell lung cancer Immunotherapy on Hold by oncology Dr. Earlie Server due to acute infection. -Repeat CT chest 11/04/16 - Significant progression of pulmonary disease with numerous ground-glass nodules throughout upper and lower lobes bilaterally  with significant components involving the left lower lobe, lingula/left upper lobe. Bilateral pleural effusions left greater than right.Stable appearance of mediastinal lymph nodes and sclerotic lesion in the T9 vertebral body -per Dr.Mohamed  DVT Prophylaxis lovenox  Code Status: DNR Family Communication: patient and son at bedside Disposition Plan: pending clinical improvement   Consultants:  ID and Dr.Mohamed  Procedures:  none  Antibiotics:  vanc from admission to 2/1-2/4  Cefepime 2/1-2/4  Zyvox/Merrem 2/4->   Objective:  Denies any complaints this morning. States he is feeling ok, his appetite still remains poor at this time.   BP (!) 118/47 (BP Location: Left Arm)   Pulse 90   Temp 97.7 F (36.5 C) (Oral)   Resp (!) 24   Ht '6\' 2"'$  (1.88 m)   Wt 103 kg (227 lb 1.2 oz)   SpO2 98%   BMI 29.15 kg/m   Intake/Output Summary (Last 24 hours) at 11/07/16 0938 Last data filed at 11/07/16 0559  Gross per 24 hour  Intake              100 ml  Output             2475 ml  Net            -2375 ml   Filed Weights   11/01/16 1826 11/03/16 2308  Weight: 100.6 kg (221 lb 12.5 oz) 103 kg (227 lb 1.2 oz)    Exam:   General:  Weak, AAOx3, no distress  Cardiovascular: RRR  Respiratory: crackles bilateral lower lobe, no wheezing, no rhonchi  Abdomen: Soft/ND/NT, positive BS  Ext: no edema   Data Reviewed: Basic Metabolic Panel:  Recent Labs Lab 11/03/16 0409 11/04/16 0643 11/04/16 1511 11/05/16  0542 11/06/16 0447  NA 136 136 135 134* 137  K 3.3* 3.4* 3.4* 3.3* 3.8  CL 108 107 105 106 109  CO2 20* '24 22 22 '$ 21*  GLUCOSE 96 109* 128* 136* 99  BUN '17 16 14 13 17  '$ CREATININE 1.33* 1.21 1.28* 1.20 1.17  CALCIUM 8.4* 8.3* 8.6* 8.2* 8.1*  MG  --  1.5*  --  1.8 1.9   Liver Function Tests:  Recent Labs Lab 11/01/16 0913 11/01/16 1102 11/03/16 0409 11/04/16 1511 11/05/16 0542  AST 23 31 37 76* 87*  ALT 13 15* 16* 20 22  ALKPHOS 70 54 40 46 42    BILITOT 0.39 0.7 0.6 0.7 0.4  PROT 7.6 7.7 5.7* 6.7 6.0*  ALBUMIN 3.3* 3.5 2.7* 3.0* 2.6*   No results for input(s): LIPASE, AMYLASE in the last 168 hours. No results for input(s): AMMONIA in the last 168 hours. CBC:  Recent Labs Lab 11/01/16 0913  11/01/16 1102 11/01/16 1107 11/02/16 0348 11/03/16 0409 11/04/16 0643 11/05/16 0542  WBC 5.7  --  5.5  --  4.7 3.2* 2.8* 3.5*  NEUTROABS 4.4  --  4.7  --   --  2.4 2.1  --   HGB 12.6*  < > 12.6* 13.3 10.5* 10.1* 10.8* 11.3*  HCT 37.6*  < > 37.1* 39.0 30.6* 29.9* 31.7* 32.7*  MCV 89.1  --  88.1  --  88.2 88.2 87.8 84.7  PLT 164  --  175  --  142* 125* 118* 100*  < > = values in this interval not displayed. Cardiac Enzymes:   No results for input(s): CKTOTAL, CKMB, CKMBINDEX, TROPONINI in the last 168 hours. BNP (last 3 results)  Recent Labs  06/28/16 0946  BNP 123.7*    ProBNP (last 3 results) No results for input(s): PROBNP in the last 8760 hours.  CBG: No results for input(s): GLUCAP in the last 168 hours.  Recent Results (from the past 240 hour(s))  Blood Culture (routine x 2)     Status: None   Collection Time: 11/01/16 10:54 AM  Result Value Ref Range Status   Specimen Description BLOOD RIGHT ANTECUBITAL  Final   Special Requests BOTTLES DRAWN AEROBIC AND ANAEROBIC 5CC  Final   Culture   Final    NO GROWTH 5 DAYS Performed at [facility], [address] [place], [place] [address]    Report Status 11/06/2016 FINAL  Final  Blood Culture (routine x 2)     Status: None   Collection Time: 11/01/16 10:58 AM  Result Value Ref Range Status   Specimen Description BLOOD LEFT ANTECUBITAL  Final   Special Requests BOTTLES DRAWN AEROBIC AND ANAEROBIC 5ML  Final   Culture   Final    NO GROWTH 5 DAYS Performed at [facility], [place] [address], [place], [place] [address]    Report Status 11/06/2016 FINAL  Final  Urine culture     Status: None   Collection Time: 11/01/16  3:45 PM  Result Value Ref Range  Status   Specimen Description URINE, RANDOM  Final   Special Requests NONE  Final   Culture   Final    NO GROWTH Performed at [facility], [place] [address], [place], [place] [address]    Report Status 11/02/2016 FINAL  Final  MRSA PCR Screening     Status: None   Collection Time: 11/01/16  6:38 PM  Result Value Ref Range Status   MRSA by PCR  NEGATIVE NEGATIVE Final    Comment:        The GeneXpert MRSA Assay (FDA approved for NASAL specimens only), is one component of a comprehensive MRSA colonization surveillance program. It is not intended to diagnose MRSA infection nor to guide or monitor treatment for MRSA infections.   Culture, blood (routine x 2)     Status: None (Preliminary result)   Collection Time: 11/04/16  3:11 PM  Result Value Ref Range Status   Specimen Description BLOOD RIGHT ANTECUBITAL  Final   Special Requests BOTTLES DRAWN AEROBIC AND ANAEROBIC 5 CC EACH  Final   Culture   Final    NO GROWTH 2 DAYS Performed at [facility], [address], [place], [place] [address]    Report Status PENDING  Incomplete  Culture, blood (routine x 2)     Status: None (Preliminary result)   Collection Time: 11/04/16  3:11 PM  Result Value Ref Range Status   Specimen Description BLOOD LEFT ANTECUBITAL  Final   Special Requests IN PEDIATRIC BOTTLE 1.5CC  Final   Culture   Final    NO GROWTH 2 DAYS Performed at [facility], [place] [address], [place], [place] [address]    Report Status PENDING  Incomplete  Urine culture     Status: None   Collection Time: 11/04/16  3:57 PM  Result Value Ref Range Status   Specimen Description URINE, RANDOM  Final   Special Requests NONE  Final   Culture   Final    NO GROWTH Performed at [facility], [address], [place], [place] [address]    Report Status 11/06/2016 FINAL  Final  Culture, sputum-assessment     Status: None   Collection Time: 11/05/16 10:20 PM  Result Value Ref Range Status   Specimen  Description SPUTUM  Final   Special Requests Immunocompromised  Final   Sputum evaluation THIS SPECIMEN IS ACCEPTABLE FOR SPUTUM CULTURE  Final   Report Status 11/05/2016 FINAL  Final  Culture, respiratory (NON-Expectorated)     Status: None (Preliminary result)   Collection Time: 11/05/16 10:20 PM  Result Value Ref Range Status   Specimen Description SPUTUM  Final   Special Requests Immunocompromised Reflexed from [id number]  Final   Gram Stain   Final    MODERATE WBC PRESENT, PREDOMINANTLY PMN ABUNDANT GRAM POSITIVE COCCI IN PAIRS FEW GRAM POSITIVE RODS FEW YEAST Performed at [facility], [place] [address] [place], [place] [address]    Culture PENDING  Incomplete   Report Status PENDING  Incomplete     Studies: No results found.  Scheduled Meds: . amiodarone  200 mg Oral Daily  . aspirin EC  81 mg Oral Daily  . dorzolamide  1 drop Left Eye BID  . enoxaparin (LOVENOX) injection  40 mg Subcutaneous Q24H  . feeding supplement (ENSURE ENLIVE)  237 mL Oral TID BM  . finasteride  5 mg Oral Daily  . fluticasone  1 spray Each Nare Daily  . folic acid  1 mg Oral Daily  . guaiFENesin  600 mg Oral BID  . ipratropium-albuterol  3 mL Nebulization TID  . latanoprost  1 drop Both Eyes QHS  . linezolid (ZYVOX) IV  600 mg Intravenous Q12H  . meropenem (MERREM) IV  1 g Intravenous Q8H  . metoprolol  12.5 mg Oral BID  . tamsulosin  0.4 mg Oral Daily  . vitamin B-12  1,000 mcg Oral Daily  Continuous Infusions:    Time spent: 34mns  Ankit CArsenio LoaderMD  [facility] Pager  If 7PM-7AM, please contact night-coverage at [email or web address], password [email or web address]2/03/2017, 9:38 AM  LOS: 6 days

## 2016-11-07 NOTE — Care Management Note (Signed)
Case Management Note  Patient Details  Name: Matthew Berger MRN: [id number] Date of Birth: [birth year]  Subjective/Objective: 81 y/o m admitted w/influenza. From home. PT cons-await recc.                   Action/Plan:d/c plan home.   Expected Discharge Date:   (unknown)               Expected Discharge Plan:  [place]  In-House Referral:     Discharge planning Services  CM Consult  Post Acute Care Choice:    Choice offered to:     DME Arranged:    DME Agency:     HH Arranged:    HH Agency:     Status of Service:  In process, will continue to follow  If discussed at Long Length of Stay Meetings, dates discussed:    Additional Comments:  [name], RN 11/07/2016, 11:29 AM

## 2016-11-07 NOTE — Progress Notes (Addendum)
Patient ID: Matthew Berger, male   DOB: 1936/06/02, 81 y.o.   MRN: [id number]          [facility] for Infectious Disease  Date of Admission:  11/01/2016   Total days of antibiotics 7        Day 3 linezolid        Day 3 meropenem         Principal Problem:   Influenza B Active Problems:   HCAP (healthcare-associated pneumonia)   HTN (hypertension)   BPH (benign prostatic hyperplasia)   GERD (gastroesophageal reflux disease)   Non-small cell lung cancer (NSCLC) (HCC)   Hypoxia   Acute respiratory failure (HCC)   Protein-calorie malnutrition, severe   . amiodarone  200 mg Oral Daily  . aspirin EC  81 mg Oral Daily  . dorzolamide  1 drop Left Eye BID  . enoxaparin (LOVENOX) injection  40 mg Subcutaneous Q24H  . feeding supplement (ENSURE ENLIVE)  237 mL Oral TID BM  . finasteride  5 mg Oral Daily  . fluticasone  1 spray Each Nare Daily  . folic acid  1 mg Oral Daily  . guaiFENesin  600 mg Oral BID  . ipratropium-albuterol  3 mL Nebulization TID  . latanoprost  1 drop Both Eyes QHS  . linezolid (ZYVOX) IV  600 mg Intravenous Q12H  . meropenem (MERREM) IV  1 g Intravenous Q8H  . metoprolol  12.5 mg Oral BID  . tamsulosin  0.4 mg Oral Daily  . vitamin B-12  1,000 mcg Oral Daily    SUBJECTIVE: He is feeling a little better today and coughing less.  Review of Systems: Review of Systems  Constitutional: Positive for fever and malaise/fatigue. Negative for chills and diaphoresis.  Respiratory: Positive for cough and shortness of breath. Negative for sputum production.   Cardiovascular: Negative for chest pain.    Past Medical History:  Diagnosis Date  . Atrial fibrillation ([place])   . CHF (congestive heart failure) ([place])   . Encounter for antineoplastic chemotherapy 01/05/2016  . Encounter for antineoplastic immunotherapy 07/12/2016  . GERD (gastroesophageal reflux disease)   . Hypercholesteremia   . Hypertension   . Non-small cell lung cancer (NSCLC) ([place])  11/19/15  . Pneumonia 11/18/2015    Social History  Substance Use Topics  . Smoking status: Former Smoker    Packs/day: 2.00    Years: 40.00    Types: Cigarettes    Quit date: 10/02/1990  . Smokeless tobacco: Never Used     Comment: smoked 1-2 ppd.   . Alcohol use No    Family History  Problem Relation Age of Onset  . Breast cancer Sister   . Stroke Sister   . Colon cancer Brother   . Cancer Maternal Aunt     x2  . Hypertension Mother   . Stroke Mother   . Breast cancer Cousin   . Diabetes Other   . Hypertension Other   . Hyperlipidemia Other    No Known Allergies  OBJECTIVE: Vitals:   11/07/16 1131 11/07/16 1304 11/07/16 1305 11/07/16 1411  BP: 112/65  109/64   Pulse: 96  92   Resp:   20   Temp:   98.6 F (37 C)   TempSrc:   Oral   SpO2:  (!) 88% 98% 93%  Weight:      Height:       Body mass index is 29.15 kg/m.  Physical Exam  Constitutional: He is oriented to person, place,  and time.  He is resting quietly in bed.  Cardiovascular:  Distant heart sounds difficult to hear.  Pulmonary/Chest: Effort normal. He has rales.  Few crackles on right.  Abdominal: Soft. There is no rebound.  Neurological: He is alert and oriented to person, place, and time.  Skin: No rash noted.  Psychiatric: Mood and affect normal.    Lab Results Lab Results  Component Value Date   WBC 3.5 (L) 11/05/2016   HGB 11.3 (L) 11/05/2016   HCT 32.7 (L) 11/05/2016   MCV 84.7 11/05/2016   PLT 100 (L) 11/05/2016    Lab Results  Component Value Date   CREATININE 1.17 11/06/2016   BUN 17 11/06/2016   NA 137 11/06/2016   K 3.8 11/06/2016   CL 109 11/06/2016   CO2 21 (L) 11/06/2016    Lab Results  Component Value Date   ALT 22 11/05/2016   AST 87 (H) 11/05/2016   ALKPHOS 42 11/05/2016   BILITOT 0.4 11/05/2016     Microbiology: Recent Results (from the past 240 hour(s))  Blood Culture (routine x 2)     Status: None   Collection Time: 11/01/16 10:54 AM  Result Value Ref  Range Status   Specimen Description BLOOD RIGHT ANTECUBITAL  Final   Special Requests BOTTLES DRAWN AEROBIC AND ANAEROBIC 5CC  Final   Culture   Final    NO GROWTH 5 DAYS Performed at [facility], [place] [address], [place], [place] [address]    Report Status 11/06/2016 FINAL  Final  Blood Culture (routine x 2)     Status: None   Collection Time: 11/01/16 10:58 AM  Result Value Ref Range Status   Specimen Description BLOOD LEFT ANTECUBITAL  Final   Special Requests BOTTLES DRAWN AEROBIC AND ANAEROBIC 5ML  Final   Culture   Final    NO GROWTH 5 DAYS Performed at [facility], [place] [address] [place], [place] [address]    Report Status 11/06/2016 FINAL  Final  Urine culture     Status: None   Collection Time: 11/01/16  3:45 PM  Result Value Ref Range Status   Specimen Description URINE, RANDOM  Final   Special Requests NONE  Final   Culture   Final    NO GROWTH Performed at [facility], [place] [address] [place], [place] [address]    Report Status 11/02/2016 FINAL  Final  MRSA PCR Screening     Status: None   Collection Time: 11/01/16  6:38 PM  Result Value Ref Range Status   MRSA by PCR NEGATIVE NEGATIVE Final    Comment:        The GeneXpert MRSA Assay (FDA approved for NASAL specimens only), is one component of a comprehensive MRSA colonization surveillance program. It is not intended to diagnose MRSA infection nor to guide or monitor treatment for MRSA infections.   Culture, blood (routine x 2)     Status: None (Preliminary result)   Collection Time: 11/04/16  3:11 PM  Result Value Ref Range Status   Specimen Description BLOOD RIGHT ANTECUBITAL  Final   Special Requests BOTTLES DRAWN AEROBIC AND ANAEROBIC 5 CC EACH  Final   Culture   Final    NO GROWTH 3 DAYS Performed at [facility], [place] [address], [place], [place] [address]    Report Status PENDING  Incomplete  Culture, blood (routine x 2)     Status: None (Preliminary result)    Collection  Time: 11/04/16  3:11 PM  Result Value Ref Range Status   Specimen Description BLOOD LEFT ANTECUBITAL  Final   Special Requests IN PEDIATRIC BOTTLE 1.5CC  Final   Culture   Final    NO GROWTH 3 DAYS Performed at [facility], [place] [address] [place], [place] [address]    Report Status PENDING  Incomplete  Urine culture     Status: None   Collection Time: 11/04/16  3:57 PM  Result Value Ref Range Status   Specimen Description URINE, RANDOM  Final   Special Requests NONE  Final   Culture   Final    NO GROWTH Performed at [facility], [address] [place], [place] [address]    Report Status 11/06/2016 FINAL  Final  Culture, sputum-assessment     Status: None   Collection Time: 11/05/16 10:20 PM  Result Value Ref Range Status   Specimen Description SPUTUM  Final   Special Requests Immunocompromised  Final   Sputum evaluation THIS SPECIMEN IS ACCEPTABLE FOR SPUTUM CULTURE  Final   Report Status 11/05/2016 FINAL  Final  Culture, respiratory (NON-Expectorated)     Status: None (Preliminary result)   Collection Time: 11/05/16 10:20 PM  Result Value Ref Range Status   Specimen Description SPUTUM  Final   Special Requests Immunocompromised Reflexed from [id number]  Final   Gram Stain   Final    MODERATE WBC PRESENT, PREDOMINANTLY PMN ABUNDANT GRAM POSITIVE COCCI IN PAIRS FEW GRAM POSITIVE RODS FEW YEAST    Culture   Final    CULTURE REINCUBATED FOR BETTER GROWTH Performed at [facility], [place] [address], [place],  [address]    Report Status PENDING  Incomplete     ASSESSMENT: He has improved and is now afebrile. He has completed therapy for influenza and superimposed pneumonia.  PLAN: 1. Discontinue linezolid and meropenem 2. I will sign off now  Michel Bickers, MD [facility] for [place] [phone] pager   [phone] cell 11/07/2016, 5:30 PM

## 2016-11-07 NOTE — Progress Notes (Signed)
Spoke with Dr Megan Salon to clarify plan of action for antibiotics.  Per Dr Megan Salon, plan is to discontinue antibiotics.  Leone Haven, PharmD

## 2016-11-08 LAB — CBC AND DIFFERENTIAL: WBC: 3.8 10*3/mL

## 2016-11-08 LAB — CULTURE, RESPIRATORY

## 2016-11-08 LAB — MAGNESIUM: Magnesium: 2 mg/dL (ref 1.7–2.4)

## 2016-11-08 LAB — BASIC METABOLIC PANEL
ANION GAP: 7 (ref 5–15)
BUN: 16 mg/dL (ref 4–21)
BUN: 16 mg/dL (ref 6–20)
CHLORIDE: 108 mmol/L (ref 101–111)
CO2: 23 mmol/L (ref 22–32)
Calcium: 8.7 mg/dL — ABNORMAL LOW (ref 8.9–10.3)
Creatinine, Ser: 1.01 mg/dL (ref 0.61–1.24)
Creatinine: 1 mg/dL (ref 0.6–1.3)
GFR calc non Af Amer: >60 mL/min (ref >60)
Glucose, Bld: 105 mg/dL — ABNORMAL HIGH (ref 65–99)
Glucose: 105 mg/dL
Potassium: 3.3 mmol/L — ABNORMAL LOW (ref 3.5–5.1)
Sodium: 138 mmol/L (ref 135–145)
Sodium: 138 mmol/L (ref 137–147)

## 2016-11-08 LAB — CBC
HCT: 31.8 % — ABNORMAL LOW (ref 39.0–52.0)
HEMOGLOBIN: 10.8 g/dL — AB (ref 13.0–17.0)
MCH: 29 pg (ref 26.0–34.0)
MCHC: 34 g/dL (ref 30.0–36.0)
MCV: 85.3 fL (ref 78.0–100.0)
Platelets: 126 10*3/uL — ABNORMAL LOW (ref 150–400)
RBC: 3.73 MIL/uL — AB (ref 4.22–5.81)
RDW: 14.3 % (ref 11.5–15.5)
WBC: 3.8 10*3/uL — ABNORMAL LOW (ref 4.0–10.5)

## 2016-11-08 LAB — CULTURE, RESPIRATORY W GRAM STAIN: Culture: NORMAL

## 2016-11-08 MED ORDER — IPRATROPIUM-ALBUTEROL 0.5-2.5 (3) MG/3ML IN SOLN: 3.0000 mL | Freq: Four times a day (QID) | RESPIRATORY_TRACT | 0 refills | Status: AC | PRN

## 2016-11-08 NOTE — Clinical Social Work Placement (Signed)
Medical Social Worker facilitated patient discharge including contacting patient family and facility to confirm patient discharge plans.  Clinical information faxed to facility and family agreeable with plan.  MSW arranged ambulance transport via [facility] to [facility].  RN to call report prior to discharge.  Medical Social Worker will sign off for now as social work intervention is no longer needed. Please consult [place] again if new need arises.  Glendon Axe, MSW [phone]   CLINICAL SOCIAL WORK PLACEMENT  NOTE  Date:  11/08/2016  Patient Details  Name: Matthew Berger MRN: [id number] Date of Birth: [birth year]  Clinical Social Work is seeking post-discharge placement for this patient at the [place] level of care (*CSW will initial, date and re-position this form in  chart as items are completed):  Yes   Patient/family provided with [facility] Work Department's list of facilities offering this level of care within the geographic area requested by the patient (or if unable, by the patient's family).  Yes   Patient/family informed of their freedom to choose among providers that offer the needed level of care, that participate in Medicare, Medicaid or managed care program needed by the patient, have an available bed and are willing to accept the patient.  Yes   Patient/family informed of [facility]'s ownership interest in [facility] and [facility], as well as of the fact that they are under no obligation to receive care at these facilities.  PASRR submitted to EDS on 11/08/16     PASRR number received on 11/08/16     Existing PASRR number confirmed on       FL2 transmitted to all facilities in geographic area requested by pt/family on 11/08/16     FL2 transmitted to all facilities within larger geographic area on       Patient informed that his/her managed care company has contracts with or will negotiate with certain  facilities, including the following:        Yes   Patient/family informed of bed offers received.  Patient chooses bed at  [facility] and [place] )     Physician recommends and patient chooses bed at      Patient to be transferred to  [facility] ) on 11/08/16.  Patient to be transferred to facility by  [facility] )     Patient family notified on 11/08/16 of transfer.  Name of family member notified:   (Pt's son, Jeneen Rinks )     PHYSICIAN Please prepare priority discharge summary, including medications, Please sign FL2, Please sign DNR     Additional Comment:    _______________________________________________ Glendon Axe A 11/08/2016, 1:57 PM

## 2016-11-08 NOTE — Clinical Social Work Placement (Signed)
   CLINICAL SOCIAL WORK PLACEMENT  NOTE  Date:  11/08/2016  Patient Details  Name: Matthew Berger MRN: [id number] Date of Birth: Jan 07, 1936  Clinical Social Work is seeking post-discharge placement for this patient at the [place] level of care (*CSW will initial, date and re-position this form in  chart as items are completed):  Yes   Patient/family provided with [facility] Work Department's list of facilities offering this level of care within the geographic area requested by the patient (or if unable, by the patient's family).  Yes   Patient/family informed of their freedom to choose among providers that offer the needed level of care, that participate in Medicare, Medicaid or managed care program needed by the patient, have an available bed and are willing to accept the patient.  Yes   Patient/family informed of [facility]'s ownership interest in [facility] and [facility], as well as of the fact that they are under no obligation to receive care at these facilities.  PASRR submitted to EDS on 11/08/16     PASRR number received on 11/08/16     Existing PASRR number confirmed on       FL2 transmitted to all facilities in geographic area requested by pt/family on 11/08/16     FL2 transmitted to all facilities within larger geographic area on       Patient informed that his/her managed care company has contracts with or will negotiate with certain facilities, including the following:            Patient/family informed of bed offers received.  Patient chooses bed at       Physician recommends and patient chooses bed at      Patient to be transferred to   on  .  Patient to be transferred to facility by       Patient family notified on   of transfer.  Name of family member notified:        PHYSICIAN       Additional Comment:    _______________________________________________ Standley Brooking, LCSW 11/08/2016, 11:10 AM

## 2016-11-08 NOTE — Evaluation (Signed)
Physical Therapy Evaluation Patient Details Name: Matthew Berger MRN: [id number] DOB: 1935-11-20 Today's Date: 11/08/2016   History of Present Illness  81 y.o. male currently being treated for stage IV lung cancer with metastasis, history of GERD, hypertension and admitted with fever, influenza B, HCAP  Clinical Impression  Pt admitted with above diagnosis. Pt currently with functional limitations due to the deficits listed below (see PT Problem List).  Pt will benefit from skilled PT to increase their independence and safety with mobility to allow discharge to the venue listed below.  Pt reports not being out of bed since admission.  Pt states he typically wears oxygen at night however very SOB and increased WOB with ambulating on room air.  SpO2 86% upon reaching recliner on room air so applied O2 [place] and RN aware.  Pt requiring mod assist at this time for mobility.  Recommend ST-SNF upon d/c.     Follow Up Recommendations SNF;Supervision/Assistance - 24 hour    Equipment Recommendations  Rolling walker with 5" wheels;3in1 (PT)    Recommendations for Other Services       Precautions / Restrictions Precautions Precautions: Fall Precaution Comments: monitor sats      Mobility  Bed Mobility Overal bed mobility: Needs Assistance             General bed mobility comments: sitting EOB on arrival with NT  Transfers Overall transfer level: Needs assistance Equipment used: 4-wheeled walker Transfers: Sit to/from Stand Sit to Stand: Mod assist         General transfer comment: initially mod assist for power up and steadying upon standing, decreased control of descent as well, verbal cue for using brakes on rollator  Ambulation/Gait Ambulation/Gait assistance: Min assist Ambulation Distance (Feet): 12 Feet Assistive device: 4-wheeled walker Gait Pattern/deviations: Step-through pattern;Decreased stride length;Wide base of support     General Gait Details: asssit for  steadying, increased work of breathing and fatigues quickly, Spo2 86% room air upon reaching recliner, applied 2L O2 [place] 89% (RN notified)  Stairs            Wheelchair Mobility    Modified Rankin (Stroke Patients Only)       Balance Overall balance assessment: Needs assistance         Standing balance support: Bilateral upper extremity supported Standing balance-Leahy Scale: Poor Standing balance comment: requires UE support and some external assist for challenges                             Pertinent Vitals/Pain Pain Assessment: No/denies pain    Home Living Family/patient expects to be discharged to:: Private residence Living Arrangements: Spouse/significant other Available Help at Discharge: Family Type of Home: House Home Access: Stairs to enter     Home Layout: One level Home Equipment: Cane - single point;Walker - 4 wheels      Prior Function Level of Independence: Independent with assistive device(s)               Hand Dominance        Extremity/Trunk Assessment        Lower Extremity Assessment Lower Extremity Assessment: Generalized weakness       Communication   Communication: HOH  Cognition Arousal/Alertness: Awake/alert Behavior During Therapy: WFL for tasks assessed/performed Overall Cognitive Status: Within Functional Limits for tasks assessed  General Comments      Exercises     Assessment/Plan    PT Assessment Patient needs continued PT services  PT Problem List Decreased strength;Decreased activity tolerance;Decreased balance;Decreased mobility;Cardiopulmonary status limiting activity;Decreased knowledge of use of DME          PT Treatment Interventions DME instruction;Gait training;Therapeutic activities;Therapeutic exercise;Functional mobility training;Balance training;Patient/family education    PT Goals (Current goals can be found in the Care Plan section)  Acute Rehab  PT Goals PT Goal Formulation: With patient Time For Goal Achievement: 11/15/16 Potential to Achieve Goals: Fair    Frequency Min 3X/week   Barriers to discharge        Co-evaluation               End of Session Equipment Utilized During Treatment: Gait belt;Oxygen Activity Tolerance: Patient limited by fatigue Patient left: in chair;with call bell/phone within reach Nurse Communication: Mobility status         Time: 1004-1017 PT Time Calculation (min) (ACUTE ONLY): 13 min   Charges:   PT Evaluation $PT Eval Low Complexity: 1 Procedure     PT G Codes:        Matthew Berger,Matthew Berger 11/08/2016, 10:29 AM Carmelia Bake, PT, DPT 11/08/2016 Pager: [phone]

## 2016-11-08 NOTE — Discharge Summary (Signed)
Physician Discharge Summary  Matthew Berger XMI:[id number] DOB: 10-12-1935 DOA: 11/01/2016  PCP: [name], MD  Admit date: 11/01/2016 Discharge date: 11/08/2016  Admitted From: [facility] Disposition:  SNF  Recommendations for Outpatient Follow-up:  1. Follow up with PCP in 1-2 weeks 2. Follow up Dr Earlie Server in 1-2 weeks as well   Home Health: No Equipment/Devices: O2 at 2L (around the clock, wean off as needed) Discharge Condition: Stable CODE STATUS: Full  Diet recommendation: Regular  Brief/Interim Summary: Matthew Berger a 81 y.o.male,Currently being treated for stage IV lung cancer with metastasis, history of GERD, hypertension, who presented to the ED from oncology clinic, with 48 hour history of  fever, cough, productive sputum, shortness of breath and flu-like symptoms.  Upon his admission he was started on broad-spectrum antibiotics which was later changed to linezolid and meropenem with recommendations of infectious disease. He was also diagnosed of FLU positive therefore received full course treatment of Tamiflu. CT of the chest was done which showed significant progression of his pulmonary disease with numerous groundglass opacity in upper and lower lobes bilaterally suggesting possible pneumonia. Over the course of next 2 days his breathing improved he remained afebrile and his antibiotics were stopped after 3 days. He was also seen by physical therapy but did get short of breath when moving from his bed to the chair also with ambulation his pulse ox dropped to mid 80s. I spoke with the son who said patient normally uses 2 L of oxygen at home at night which she has been doing well with. Today he seems medically stable to be discharged at a skilled nursing facility. I've advised patient to uses 2 L of oxygen via nasal cannula and it will slowly be weaned off. He needs to follow-up with his primary care physician and Dr. Earlie Server in 1-2 weeks.  Discharge Diagnoses:  Principal  Problem:   Influenza B Active Problems:   HTN (hypertension)   BPH (benign prostatic hyperplasia)   GERD (gastroesophageal reflux disease)   Non-small cell lung cancer (NSCLC) (HCC)   Hypoxia   HCAP (healthcare-associated pneumonia)   Acute respiratory failure (HCC)   Protein-calorie malnutrition, severe  Fever: resolved  -Blood Cx negative  Influenza b: tamilfu -s/p 5days ended 11/05/16  HCAP/secondary bacterial infection after Flu - resolved  -Abx stopped, completed tx at this time  -CTA  on 2/3 indicates some progression of pulmonary disease, no PE  Multifocal atrial tachycardia vs afib Seen in the past by cardiology, initially thought to be having atrial fibrillation during previous admission on 06/2016, recommendation for beta blocker, calcium channel blocker, amiodarone,   calcium channel blocker held since admission giving soft blood pressure. -Heart rate controlled on betablocker and amiodarone. -Not on Anticoagulation in the past due to thrombocytopenia.   Acute on Chronic Diastolic CHF - resolved  - with last ECHO 11/2015 with stable EF 60% and grade I diastolic CHF - stop IVF, lasix '40mg'$  Iv x2 given. Monitor for now.   Hypokalemia/hypomagnesemia replete k/mag as needed   Hypertension - restart home emds    BPH - Continue with Flomax  Non-small cell lung cancer Immunotherapy on Hold by oncology Dr. Earlie Server due to acute infection. -Repeat CT chest 11/04/16 - Significant progression of pulmonary disease with numerous ground-glass nodules throughout upper and lower lobes bilaterally with significant components involving the left lower lobe, lingula/left upper lobe. Bilateral pleural effusions left greater than right.Stable appearance of mediastinal lymph nodes and sclerotic lesion in the T9 vertebral body -per Dr.Mohamed  Discharge Instructions   Allergies as of 11/08/2016   No Known Allergies     Medication List    TAKE these medications    amiodarone 200 MG tablet Commonly known as:  PACERONE Take 1 tablet (200 mg total) by mouth daily. Please call cardiology Dr Einar Gip for refills.   aspirin 81 MG tablet Take 81 mg by mouth daily.   diltiazem 180 MG 24 hr capsule Commonly known as:  CARDIZEM CD Take 1 capsule (180 mg total) by mouth 2 (two) times daily. What changed:  when to take this   dorzolamide 2 % ophthalmic solution Commonly known as:  TRUSOPT Place 1 drop into the left eye 2 (two) times daily.   finasteride 5 MG tablet Commonly known as:  PROSCAR Take 5 mg by mouth daily.   FLOMAX 0.4 MG Caps capsule Generic drug:  tamsulosin Take 0.4 mg by mouth daily.   folic acid 1 MG tablet Commonly known as:  FOLVITE TAKE 1 TABLET (1 MG TOTAL) BY MOUTH DAILY.   guaifenesin 100 MG/5ML syrup Commonly known as:  ROBITUSSIN Take 200 mg by mouth 3 (three) times daily as needed for cough.   guaiFENesin-dextromethorphan 100-10 MG/5ML syrup Commonly known as:  ROBITUSSIN DM Take 20 mLs by mouth every 4 (four) hours as needed for cough.   ipratropium-albuterol 0.5-2.5 (3) MG/3ML Soln Commonly known as:  DUONEB Take 3 mLs by nebulization every 6 (six) hours as needed.   latanoprost 0.005 % ophthalmic solution Commonly known as:  XALATAN Place 1 drop into both eyes at bedtime.   metoprolol 50 MG tablet Commonly known as:  LOPRESSOR Take 1 tablet (50 mg total) by mouth 2 (two) times daily.   prochlorperazine 10 MG tablet Commonly known as:  COMPAZINE TAKE 1 TABLET BY MOUTH EVERY 6 HOURS AS NEEDED FOR NAUSEA AND VOMITING   SIMBRINZA 1-0.2 % Susp Generic drug:  Brinzolamide-Brimonidine Apply 2 drops to eye 2 (two) times daily.   vitamin B-12 1000 MCG tablet Commonly known as:  CYANOCOBALAMIN Take 1,000 mcg by mouth daily.      Follow-up Information    MOREIRA,ROY, MD Follow up in 1 week(s).   Specialty:  Internal Medicine Contact information: [address] [address] [phone]         Eilleen Kempf., MD Follow up in 1 week(s).   Specialty:  Oncology Contact information: [place] [address] [phone]          No Known Allergies  Consultations:  Dr Megan Salon from ID   Procedures/Studies: Dg Chest 2 View  Result Date: 11/01/2016 CLINICAL DATA:  History of lung carcinoma with fever and flu like symptoms EXAM: CHEST  2 VIEW COMPARISON:  09/15/2016 FINDINGS: Cardiac shadow is within normal. Diffuse density noted throughout the left lung base similar to that seen on recent CT examination representing a component of neoplasm and consolidation. No sizable effusion is noted. The nodular changes seen in the right lung are less well appreciated on the current exam. IMPRESSION: Improved aeration in the left base although persistent density is noted similar to that seen on prior CT examination. The previously seen right-sided nodules are less well appreciated on the current study. Electronically Signed   By: Inez Catalina M.D.   On: 11/01/2016 11:20   Ct Head Wo Contrast  Result Date: 11/04/2016 CLINICAL DATA:  Altered mental status.  History of lung cancer. EXAM: CT HEAD WITHOUT CONTRAST TECHNIQUE: Contiguous axial images were obtained from the base of  the skull through the vertex without intravenous contrast. COMPARISON:  Brain MRI dated 12/17/2015. FINDINGS: Brain: There is mild generalized age related parenchymal atrophy with commensurate dilatation of the ventricles and sulci. Mild chronic small vessel ischemic change noted within the deep periventricular white matter regions bilaterally. There is no mass, hemorrhage, edema or other evidence of acute parenchymal abnormality. No extra-axial hemorrhage. Vascular: There are chronic calcified atherosclerotic changes of the large vessels at the skull base. No unexpected hyperdense vessel. Skull: Normal. Negative for fracture or focal lesion. Sinuses/Orbits: No acute finding. Other: None. IMPRESSION: 1.  No acute findings.  No intracranial mass, hemorrhage or edema. 2. Mild atrophy and chronic ischemic changes in the white matter. Electronically Signed   By: Franki Cabot M.D.   On: 11/04/2016 18:16   Ct Angio Chest Pe W Or Wo Contrast  Result Date: 11/04/2016 CLINICAL DATA:  Positive for flu. History of lung cancer. Shortness of breath with low oxygen. EXAM: CT ANGIOGRAPHY CHEST WITH CONTRAST TECHNIQUE: Multidetector CT imaging of the chest was performed using the standard protocol during bolus administration of intravenous contrast. Multiplanar CT image reconstructions and MIPs were obtained to evaluate the vascular anatomy. CONTRAST:  100 cc Isovue 370 COMPARISON:  Chest x-ray 11/04/2016, chest CT 06/29/2016 FINDINGS: Cardiovascular: Coronary artery calcifications are present. No pericardial effusion. There is atherosclerotic calcification of the thoracic aorta not associated with aneurysm. Pulmonary arteries are only moderately well opacified by contrast bolus. No evidence for acute pulmonary emboli in the main pulmonary arteries and larger branches. Smaller pulmonary emboli would not be detectable due to artifacts and poor bolus. Mediastinum/Nodes: The visualized portion of the thyroid gland has a normal appearance. Small mediastinal lymph nodes are present, stable in appearance. Soft tissue in the precarinal/subcarinal regions appear stable with discrete nodes difficult to measure. Largest is estimated to measure approximately 11 mm greatest diameter. The esophagus is normal in appearance. Small hiatal hernia. Lungs/Pleura: There are bilateral pleural effusions, left greater than right. Consolidation in the left lower lobe persists with air bronchograms. This process is contiguous with numerous nodular opacities throughout the left lower lobe and extending into the lingula /left upper lobe. Within the right lung there are more numerous ground-glass opacities, now too numerous to count. Index lesion in the  right upper lobe previously measured 1.6 cm and currently measures 2.5 cm on image 51 of series 10. Upper Abdomen: The gallbladder is present.  No acute abnormality. Musculoskeletal: Sclerotic lesion within T9 appears stable. No new lesions or evidence for acute fracture. Review of the MIP images confirms the above findings. IMPRESSION: 1. Significant progression of pulmonary disease. Numerous ground-glass nodules are now identified throughout upper and lower lobes bilaterally with significant components involving the left lower lobe, lingula/left upper lobe. 2. Bilateral pleural effusions left greater than right. 3. Quality of the exam is limited to exclusion of pulmonary emboli within the main pulmonary arteries and segmental branches. Smaller pulmonary emboli would be very difficult to exclude. 4. Coronary artery disease. 5. Stable appearance of mediastinal lymph nodes. 6. Stable appearance of sclerotic lesion in the T9 vertebral body. Electronically Signed   By: Nolon Nations M.D.   On: 11/04/2016 18:27   Dg Chest Port 1 View  Result Date: 11/04/2016 CLINICAL DATA:  Sepsis.  History of lung cancer. EXAM: PORTABLE CHEST 1 VIEW COMPARISON:  CT scan September 15, 2016 and chest x-ray November 01, 2016 FINDINGS: The patient's known pulmonary nodules are not as well appreciated on this study compared to the  recent CT scan. Vague densities in the central right lung likely represent residual nodules. The opacity in the left mid to lower lung is similar in the interval. Left retrocardiac opacity is stable. No pneumothorax or other change. IMPRESSION: Mild improvement on the right and no significant interval change on the left. Electronically Signed   By: Dorise Bullion III M.D   On: 11/04/2016 15:37       Subjective:   Discharge Exam: Vitals:   11/08/16 0422 11/08/16 1247  BP: 122/74 129/70  Pulse: (!) 101 80  Resp: 20 16  Temp: 98.3 F (36.8 C) 98.1 F (36.7 C)   Vitals:   11/07/16 2113  11/08/16 0422 11/08/16 0836 11/08/16 1247  BP: 112/66 122/74  129/70  Pulse: 98 (!) 101  80  Resp: '20 20  16  '$ Temp: 98.3 F (36.8 C) 98.3 F (36.8 C)  98.1 F (36.7 C)  TempSrc: Oral Oral  Oral  SpO2: 92% 90% 98% 96%  Weight:      Height:        General: Pt is alert, awake, not in acute distress Cardiovascular: RRR, S1/S2 +, no rubs, no gallops Respiratory: CTA bilaterally, no wheezing, no rhonchi Abdominal: Soft, NT, ND, bowel sounds + Extremities: no edema, no cyanosis    The results of significant diagnostics from this hospitalization (including imaging, microbiology, ancillary and laboratory) are listed below for reference.     Microbiology: Recent Results (from the past 240 hour(s))  Blood Culture (routine x 2)     Status: None   Collection Time: 11/01/16 10:54 AM  Result Value Ref Range Status   Specimen Description BLOOD RIGHT ANTECUBITAL  Final   Special Requests BOTTLES DRAWN AEROBIC AND ANAEROBIC 5CC  Final   Culture   Final    NO GROWTH 5 DAYS Performed at [facility], [address], [place], [place] [address]    Report Status 11/06/2016 FINAL  Final  Blood Culture (routine x 2)     Status: None   Collection Time: 11/01/16 10:58 AM  Result Value Ref Range Status   Specimen Description BLOOD LEFT ANTECUBITAL  Final   Special Requests BOTTLES DRAWN AEROBIC AND ANAEROBIC 5ML  Final   Culture   Final    NO GROWTH 5 DAYS Performed at [facility], [place] [address], [place], [place] [address]    Report Status 11/06/2016 FINAL  Final  Urine culture     Status: None   Collection Time: 11/01/16  3:45 PM  Result Value Ref Range Status   Specimen Description URINE, RANDOM  Final   Special Requests NONE  Final   Culture   Final    NO GROWTH Performed at [facility], [place] [address] [place], [place] [address]    Report Status 11/02/2016 FINAL  Final  MRSA PCR Screening     Status: None   Collection Time: 11/01/16  6:38 PM  Result Value Ref  Range Status   MRSA by PCR NEGATIVE NEGATIVE Final    Comment:        The GeneXpert MRSA Assay (FDA approved for NASAL specimens only), is one component of a comprehensive MRSA colonization surveillance program. It is not intended to diagnose MRSA infection nor to guide or monitor treatment for MRSA infections.   Culture, blood (routine x 2)     Status: None (Preliminary result)   Collection Time: 11/04/16  3:11 PM  Result Value Ref Range Status   Specimen Description  BLOOD RIGHT ANTECUBITAL  Final   Special Requests BOTTLES DRAWN AEROBIC AND ANAEROBIC 5 CC EACH  Final   Culture   Final    NO GROWTH 3 DAYS Performed at [facility], [place] [address], [place], [place] [address]    Report Status PENDING  Incomplete  Culture, blood (routine x 2)     Status: None (Preliminary result)   Collection Time: 11/04/16  3:11 PM  Result Value Ref Range Status   Specimen Description BLOOD LEFT ANTECUBITAL  Final   Special Requests IN PEDIATRIC BOTTLE 1.5CC  Final   Culture   Final    NO GROWTH 3 DAYS Performed at [facility], [place] [address] [place], [place] [address]    Report Status PENDING  Incomplete  Urine culture     Status: None   Collection Time: 11/04/16  3:57 PM  Result Value Ref Range Status   Specimen Description URINE, RANDOM  Final   Special Requests NONE  Final   Culture   Final    NO GROWTH Performed at [facility], [address] [place], [place] [address]    Report Status 11/06/2016 FINAL  Final  Culture, sputum-assessment     Status: None   Collection Time: 11/05/16 10:20 PM  Result Value Ref Range Status   Specimen Description SPUTUM  Final   Special Requests Immunocompromised  Final   Sputum evaluation THIS SPECIMEN IS ACCEPTABLE FOR SPUTUM CULTURE  Final   Report Status 11/05/2016 FINAL  Final  Culture, respiratory (NON-Expectorated)     Status: None   Collection Time: 11/05/16 10:20 PM  Result Value Ref Range Status   Specimen  Description SPUTUM  Final   Special Requests Immunocompromised Reflexed from [id number]  Final   Gram Stain   Final    MODERATE WBC PRESENT, PREDOMINANTLY PMN ABUNDANT GRAM POSITIVE COCCI IN PAIRS FEW GRAM POSITIVE RODS FEW YEAST    Culture   Final    Consistent with normal respiratory flora. Performed at [facility], [place] [address], [place], [place] [address]    Report Status 11/08/2016 FINAL  Final     Labs: BNP (last 3 results)  Recent Labs  06/28/16 0946  BNP 462.7*   Basic Metabolic Panel:  Recent Labs Lab 11/04/16 0643 11/04/16 1511 11/05/16 0542 11/06/16 0447 11/08/16 0505  NA 136 135 134* 137 138  K 3.4* 3.4* 3.3* 3.8 3.3*  CL 107 105 106 109 108  CO2 '24 22 22 '$ 21* 23  GLUCOSE 109* 128* 136* 99 105*  BUN '16 14 13 17 16  '$ CREATININE 1.21 1.28* 1.20 1.17 1.01  CALCIUM 8.3* 8.6* 8.2* 8.1* 8.7*  MG 1.5*  --  1.8 1.9 2.0   Liver Function Tests:  Recent Labs Lab 11/03/16 0409 11/04/16 1511 11/05/16 0542  AST 37 76* 87*  ALT 16* 20 22  ALKPHOS 40 46 42  BILITOT 0.6 0.7 0.4  PROT 5.7* 6.7 6.0*  ALBUMIN 2.7* 3.0* 2.6*   No results for input(s): LIPASE, AMYLASE in the last 168 hours. No results for input(s): AMMONIA in the last 168 hours. CBC:  Recent Labs Lab 11/02/16 0348 11/03/16 0409 11/04/16 0643 11/05/16 0542 11/08/16 0505  WBC 4.7 3.2* 2.8* 3.5* 3.8*  NEUTROABS  --  2.4 2.1  --   --   HGB 10.5* 10.1* 10.8* 11.3* 10.8*  HCT 30.6* 29.9* 31.7* 32.7* 31.8*  MCV 88.2 88.2 87.8 84.7 85.3  PLT 142* 125* 118* 100* 126*  Cardiac Enzymes: No results for input(s): CKTOTAL, CKMB, CKMBINDEX, TROPONINI in the last 168 hours. BNP: Invalid input(s): POCBNP CBG: No results for input(s): GLUCAP in the last 168 hours. D-Dimer No results for input(s): DDIMER in the last 72 hours. Hgb A1c No results for input(s): HGBA1C in the last 72 hours. Lipid Profile No results for input(s): CHOL, HDL, LDLCALC, TRIG, CHOLHDL, LDLDIRECT in the last 72  hours. Thyroid function studies No results for input(s): TSH, T4TOTAL, T3FREE, THYROIDAB in the last 72 hours.  Invalid input(s): FREET3 Anemia work up No results for input(s): VITAMINB12, FOLATE, FERRITIN, TIBC, IRON, RETICCTPCT in the last 72 hours. Urinalysis    Component Value Date/Time   COLORURINE YELLOW 11/04/2016 1558   APPEARANCEUR CLEAR 11/04/2016 1558   LABSPEC 1.012 11/04/2016 1558   PHURINE 5.0 11/04/2016 1558   GLUCOSEU NEGATIVE 11/04/2016 1558   HGBUR MODERATE (A) 11/04/2016 1558   BILIRUBINUR NEGATIVE 11/04/2016 1558   KETONESUR 5 (A) 11/04/2016 1558   PROTEINUR 100 (A) 11/04/2016 1558   UROBILINOGEN 1.0 08/25/2010 1351   NITRITE NEGATIVE 11/04/2016 1558   LEUKOCYTESUR NEGATIVE 11/04/2016 1558   Sepsis Labs Invalid input(s): PROCALCITONIN,  WBC,  LACTICIDVEN Microbiology Recent Results (from the past 240 hour(s))  Blood Culture (routine x 2)     Status: None   Collection Time: 11/01/16 10:54 AM  Result Value Ref Range Status   Specimen Description BLOOD RIGHT ANTECUBITAL  Final   Special Requests BOTTLES DRAWN AEROBIC AND ANAEROBIC 5CC  Final   Culture   Final    NO GROWTH 5 DAYS Performed at [facility], [place] [address], [place], [place] [address]    Report Status 11/06/2016 FINAL  Final  Blood Culture (routine x 2)     Status: None   Collection Time: 11/01/16 10:58 AM  Result Value Ref Range Status   Specimen Description BLOOD LEFT ANTECUBITAL  Final   Special Requests BOTTLES DRAWN AEROBIC AND ANAEROBIC 5ML  Final   Culture   Final    NO GROWTH 5 DAYS Performed at [facility], [place] [address] [place], [place] [address]    Report Status 11/06/2016 FINAL  Final  Urine culture     Status: None   Collection Time: 11/01/16  3:45 PM  Result Value Ref Range Status   Specimen Description URINE, RANDOM  Final   Special Requests NONE  Final   Culture   Final    NO GROWTH Performed at [facility], [place] [address] [place], [place]  [address]    Report Status 11/02/2016 FINAL  Final  MRSA PCR Screening     Status: None   Collection Time: 11/01/16  6:38 PM  Result Value Ref Range Status   MRSA by PCR NEGATIVE NEGATIVE Final    Comment:        The GeneXpert MRSA Assay (FDA approved for NASAL specimens only), is one component of a comprehensive MRSA colonization surveillance program. It is not intended to diagnose MRSA infection nor to guide or monitor treatment for MRSA infections.   Culture, blood (routine x 2)     Status: None (Preliminary result)   Collection Time: 11/04/16  3:11 PM  Result Value Ref Range Status   Specimen Description BLOOD RIGHT ANTECUBITAL  Final   Special Requests BOTTLES DRAWN AEROBIC AND ANAEROBIC 5 CC EACH  Final   Culture   Final    NO GROWTH 3 DAYS Performed at [facility], [place],  [place] [address]    Report Status PENDING  Incomplete  Culture, blood (routine x 2)     Status: None (Preliminary result)   Collection Time: 11/04/16  3:11 PM  Result Value Ref Range Status   Specimen Description BLOOD LEFT ANTECUBITAL  Final   Special Requests IN PEDIATRIC BOTTLE 1.5CC  Final   Culture   Final    NO GROWTH 3 DAYS Performed at [facility], [place] [address] [place], [place] [address]    Report Status PENDING  Incomplete  Urine culture     Status: None   Collection Time: 11/04/16  3:57 PM  Result Value Ref Range Status   Specimen Description URINE, RANDOM  Final   Special Requests NONE  Final   Culture   Final    NO GROWTH Performed at [facility], [address], [place], [place] [address]    Report Status 11/06/2016 FINAL  Final  Culture, sputum-assessment     Status: None   Collection Time: 11/05/16 10:20 PM  Result Value Ref Range Status   Specimen Description SPUTUM  Final   Special Requests Immunocompromised  Final   Sputum evaluation THIS SPECIMEN IS ACCEPTABLE FOR SPUTUM CULTURE  Final   Report Status 11/05/2016 FINAL  Final   Culture, respiratory (NON-Expectorated)     Status: None   Collection Time: 11/05/16 10:20 PM  Result Value Ref Range Status   Specimen Description SPUTUM  Final   Special Requests Immunocompromised Reflexed from [id number]  Final   Gram Stain   Final    MODERATE WBC PRESENT, PREDOMINANTLY PMN ABUNDANT GRAM POSITIVE COCCI IN PAIRS FEW GRAM POSITIVE RODS FEW YEAST    Culture   Final    Consistent with normal respiratory flora. Performed at [facility], [place] [address] [place], [place] [address]    Report Status 11/08/2016 FINAL  Final     Time coordinating discharge: Over 30 minutes  SIGNED:   Damita Lack, MD  [facility] 11/08/2016, 1:26 PM Pager   If 7PM-7AM, please contact night-coverage [email or web address] Password [email or web address]

## 2016-11-08 NOTE — Clinical Social Work Note (Signed)
Clinical Social Work Assessment  Patient Details  Name: JAMIN PANTHER MRN: [id number] Date of Birth: 1935-11-30  Date of referral:  11/08/16               Reason for consult:  Facility Placement                Permission sought to share information with:  Chartered certified accountant granted to share information::  Yes, Verbal Permission Granted  Name::        Agency::     Relationship::     Contact Information:     Housing/Transportation Living arrangements for the past 2 months:  Single Family Home Source of Information:  Patient, Adult Children, Spouse Patient Interpreter Needed:  None Criminal Activity/Legal Involvement Pertinent to Current Situation/Hospitalization:  No - Comment as needed Significant Relationships:  Adult Children, Spouse Lives with:  Spouse Do you feel safe going back to the place where you live?  No Need for family participation in patient care:  Yes (Comment)  Care giving concerns:  CSW reviewed PT evaluation recommending SNF at discharge.    Social Worker assessment / plan:  CSW spoke with patient, wife - Iola & son, [name] via phone re: discharge planning. Patient & family are agreeable with plan for SNF and discussing with the family to see which facility they would be interested in him going to. CSW sent information out to [facility] and will follow-up with SNF bed offers.   Employment status:  Retired Nurse, adult PT Recommendations:  [place] / Referral to community resources:  [place]  Patient/Family's Response to care:    Patient/Family's Understanding of and Emotional Response to Diagnosis, Current Treatment, and Prognosis:    Emotional Assessment Appearance:  Appears stated age Attitude/Demeanor/Rapport:    Affect (typically observed):    Orientation:  Oriented to Self, Oriented to  Time, Oriented to Place, Oriented to Situation Alcohol /  Substance use:    Psych involvement (Current and /or in the community):     Discharge Needs  Concerns to be addressed:    Readmission within the last 30 days:    Current discharge risk:    Barriers to Discharge:      Standley Brooking, LCSW 11/08/2016, 11:08 AM

## 2016-11-08 NOTE — Care Management Note (Signed)
Case Management Note  Patient Details  Name: Matthew Berger MRN: [id number] Date of Birth: 1936/03/21  Subjective/Objective: PT recc SNF-CSW already following.                   Action/Plan:d/c SNF.   Expected Discharge Date:   (unknown)               Expected Discharge Plan:  Skilled Nursing Facility  In-House Referral:  Clinical Social Work  Discharge planning Services  CM Consult  Post Acute Care Choice:    Choice offered to:     DME Arranged:    DME Agency:     HH Arranged:    [place] Agency:     Status of Service:  In process, will continue to follow  If discussed at Long Length of Stay Meetings, dates discussed:    Additional Comments:  [name], RN 11/08/2016, 12:05 PM

## 2016-11-08 NOTE — Progress Notes (Signed)
PROGRESS NOTE  Matthew Berger:[id number] DOB: 09/26/1936 DOA: 11/01/2016 PCP: [name], MD  Brief narrative:  Matthew Berger  is a 81 y.o. male, Currently being treated for stage IV lung cancer with metastasis, history of GERD, hypertension, who presented to the ED from oncology clinic, with 48 hour history of  fever, cough, productive sputum, shortness of breath and flu-like symptoms. FLu positive, on Tamiflu and continues to have Fevers on Broad spectrum Abx, ID consulted. His Bx have been stopped today.   Assessment/Plan:  Fever: resolved  Continues to have intermittent fevers despite Empiric Vanc/Cefepime and Tamiflu -? Tumor fever , Secondary bacterial infection -Blood Cx negative   Influenza b: tamilfu -s/p 5days ended 11/05/16  HCAP/secondary bacterial infection after Flu - resolved  -Abx stopped, completed tx at this time  -CTA  on 2/3 indicates some progression of pulmonary disease, no PE  Multifocal atrial tachycardia vs afib Seen in the past by cardiology, initially thought to be having atrial fibrillation during previous admission on 06/2016, recommendation for beta blocker, calcium channel blocker, amiodarone,   calcium channel blocker held since admission giving soft blood pressure. -Heart rate controlled on betablocker and amiodarone.  Acute on Chronic Diastolic CHF - resolved  - with last ECHO 11/2015 with stable EF 60% and grade I diastolic CHF - stop IVF, lasix '40mg'$  Iv x2 given. Monitor for now.   Hypokalemia/hypomagnesemia replete k/mag as needed   Hypertension - restart home emds    BPH - Continue with Flomax  Non-small cell lung cancer Immunotherapy on Hold by oncology Dr. Earlie Server due to acute infection. -Repeat CT chest 11/04/16 - Significant progression of pulmonary disease with numerous ground-glass nodules throughout upper and lower lobes bilaterally with significant components involving the left lower lobe, lingula/left upper lobe.  Bilateral pleural effusions left greater than right.Stable appearance of mediastinal lymph nodes and sclerotic lesion in the T9 vertebral body -per Dr.Mohamed  DVT Prophylaxis lovenox  Code Status: DNR Family Communication: patient and son at bedside Disposition Plan: discharge today. He will go to SNF on 2L O2 around the clock for now (at home he used to be on 2L at night).    Consultants:  ID and Dr.Mohamed  Procedures:  none  Antibiotics:  vanc from admission to 2/1-2/4  Cefepime 2/1-2/4  Zyvox/Merrem 2/4-> 2/7   Objective:  No complaints this morning. He states his breathing symptoms have improved significantly, he remains afebrile. He does seem to get little sob and hypoxic when working with PT into mid 80s otherwise no complaints.   BP 129/70 (BP Location: Left Arm)   Pulse 80   Temp 98.1 F (36.7 C) (Oral)   Resp 16   Ht '6\' 2"'$  (1.88 m)   Wt 103 kg (227 lb 1.2 oz)   SpO2 96%   BMI 29.15 kg/m   Intake/Output Summary (Last 24 hours) at 11/08/16 1317 Last data filed at 11/08/16 0600  Gross per 24 hour  Intake              240 ml  Output              500 ml  Net             -260 ml   Filed Weights   11/01/16 1826 11/03/16 2308  Weight: 100.6 kg (221 lb 12.5 oz) 103 kg (227 lb 1.2 oz)    Exam:   General:  Weak, AAOx3, no distress  Cardiovascular: RRR  Respiratory: crackles bilateral lower lobe,  no wheezing, no rhonchi  Abdomen: Soft/ND/NT, positive BS  Ext: no edema   Data Reviewed: Basic Metabolic Panel:  Recent Labs Lab 11/04/16 0643 11/04/16 1511 11/05/16 0542 11/06/16 0447 11/08/16 0505  NA 136 135 134* 137 138  K 3.4* 3.4* 3.3* 3.8 3.3*  CL 107 105 106 109 108  CO2 '24 22 22 '$ 21* 23  GLUCOSE 109* 128* 136* 99 105*  BUN '16 14 13 17 16  '$ CREATININE 1.21 1.28* 1.20 1.17 1.01  CALCIUM 8.3* 8.6* 8.2* 8.1* 8.7*  MG 1.5*  --  1.8 1.9 2.0   Liver Function Tests:  Recent Labs Lab 11/03/16 0409 11/04/16 1511 11/05/16 0542  AST 37  76* 87*  ALT 16* 20 22  ALKPHOS 40 46 42  BILITOT 0.6 0.7 0.4  PROT 5.7* 6.7 6.0*  ALBUMIN 2.7* 3.0* 2.6*   No results for input(s): LIPASE, AMYLASE in the last 168 hours. No results for input(s): AMMONIA in the last 168 hours. CBC:  Recent Labs Lab 11/02/16 0348 11/03/16 0409 11/04/16 0643 11/05/16 0542 11/08/16 0505  WBC 4.7 3.2* 2.8* 3.5* 3.8*  NEUTROABS  --  2.4 2.1  --   --   HGB 10.5* 10.1* 10.8* 11.3* 10.8*  HCT 30.6* 29.9* 31.7* 32.7* 31.8*  MCV 88.2 88.2 87.8 84.7 85.3  PLT 142* 125* 118* 100* 126*   Cardiac Enzymes:   No results for input(s): CKTOTAL, CKMB, CKMBINDEX, TROPONINI in the last 168 hours. BNP (last 3 results)  Recent Labs  06/28/16 0946  BNP 123.7*    ProBNP (last 3 results) No results for input(s): PROBNP in the last 8760 hours.  CBG: No results for input(s): GLUCAP in the last 168 hours.  Recent Results (from the past 240 hour(s))  Blood Culture (routine x 2)     Status: None   Collection Time: 11/01/16 10:54 AM  Result Value Ref Range Status   Specimen Description BLOOD RIGHT ANTECUBITAL  Final   Special Requests BOTTLES DRAWN AEROBIC AND ANAEROBIC 5CC  Final   Culture   Final    NO GROWTH 5 DAYS Performed at [facility], [address], [place], [place] [address]    Report Status 11/06/2016 FINAL  Final  Blood Culture (routine x 2)     Status: None   Collection Time: 11/01/16 10:58 AM  Result Value Ref Range Status   Specimen Description BLOOD LEFT ANTECUBITAL  Final   Special Requests BOTTLES DRAWN AEROBIC AND ANAEROBIC 5ML  Final   Culture   Final    NO GROWTH 5 DAYS Performed at [facility], [place] [address] [place], [place] [address]    Report Status 11/06/2016 FINAL  Final  Urine culture     Status: None   Collection Time: 11/01/16  3:45 PM  Result Value Ref Range Status   Specimen Description URINE, RANDOM  Final   Special Requests NONE  Final   Culture   Final    NO GROWTH Performed at [facility], [place] [address] [place], [place] [address]    Report Status 11/02/2016 FINAL  Final  MRSA PCR Screening     Status: None   Collection Time: 11/01/16  6:38 PM  Result Value Ref Range Status   MRSA by PCR NEGATIVE NEGATIVE Final    Comment:        The GeneXpert MRSA Assay (FDA approved for NASAL specimens only), is one component of a comprehensive MRSA colonization surveillance program. It is not  intended to diagnose MRSA infection nor to guide or monitor treatment for MRSA infections.   Culture, blood (routine x 2)     Status: None (Preliminary result)   Collection Time: 11/04/16  3:11 PM  Result Value Ref Range Status   Specimen Description BLOOD RIGHT ANTECUBITAL  Final   Special Requests BOTTLES DRAWN AEROBIC AND ANAEROBIC 5 CC EACH  Final   Culture   Final    NO GROWTH 3 DAYS Performed at [facility], [place] [address] [place], [place] [address]    Report Status PENDING  Incomplete  Culture, blood (routine x 2)     Status: None (Preliminary result)   Collection Time: 11/04/16  3:11 PM  Result Value Ref Range Status   Specimen Description BLOOD LEFT ANTECUBITAL  Final   Special Requests IN PEDIATRIC BOTTLE 1.5CC  Final   Culture   Final    NO GROWTH 3 DAYS Performed at [facility], [place] [address] [place], [place] [address]    Report Status PENDING  Incomplete  Urine culture     Status: None   Collection Time: 11/04/16  3:57 PM  Result Value Ref Range Status   Specimen Description URINE, RANDOM  Final   Special Requests NONE  Final   Culture   Final    NO GROWTH Performed at [facility], [address] [place], [place] [address]    Report Status 11/06/2016 FINAL  Final  Culture, sputum-assessment     Status: None   Collection Time: 11/05/16 10:20 PM  Result Value Ref Range Status   Specimen Description SPUTUM  Final   Special Requests Immunocompromised  Final   Sputum evaluation THIS SPECIMEN IS ACCEPTABLE FOR SPUTUM CULTURE  Final     Report Status 11/05/2016 FINAL  Final  Culture, respiratory (NON-Expectorated)     Status: None   Collection Time: 11/05/16 10:20 PM  Result Value Ref Range Status   Specimen Description SPUTUM  Final   Special Requests Immunocompromised Reflexed from [id number]  Final   Gram Stain   Final    MODERATE WBC PRESENT, PREDOMINANTLY PMN ABUNDANT GRAM POSITIVE COCCI IN PAIRS FEW GRAM POSITIVE RODS FEW YEAST    Culture   Final    Consistent with normal respiratory flora. Performed at [facility], [place] [address], [place], [place] [address]    Report Status 11/08/2016 FINAL  Final     Studies: No results found.  Scheduled Meds: . amiodarone  200 mg Oral Daily  . aspirin EC  81 mg Oral Daily  . dorzolamide  1 drop Left Eye BID  . enoxaparin (LOVENOX) injection  40 mg Subcutaneous Q24H  . feeding supplement (ENSURE ENLIVE)  237 mL Oral TID BM  . finasteride  5 mg Oral Daily  . fluticasone  1 spray Each Nare Daily  . folic acid  1 mg Oral Daily  . guaiFENesin  600 mg Oral BID  . ipratropium-albuterol  3 mL Nebulization TID  . latanoprost  1 drop Both Eyes QHS  . metoprolol  12.5 mg Oral BID  . tamsulosin  0.4 mg Oral Daily  . vitamin B-12  1,000 mcg Oral Daily    Continuous Infusions: . sodium chloride 10 mL/hr at 11/07/16 1313     Time spent: 51mns  Trayven Lumadue CArsenio LoaderMD  [facility] Pager  If 7PM-7AM, please contact night-coverage at [email or web address], password [email or web address]2/04/2017, 1:17 PM  LOS: 7 days

## 2016-11-08 NOTE — Care Management Note (Signed)
Case Management Note  Patient Details  Name: ESTER MABE MRN: [id number] Date of Birth: October 28, 1935  Subjective/Objective:                    Action/Plan:d/c SNF.   Expected Discharge Date:  11/08/16               Expected Discharge Plan:  Skilled Nursing Facility  In-House Referral:  Clinical Social Work  Discharge planning Services  CM Consult  Post Acute Care Choice:    Choice offered to:     DME Arranged:    DME Agency:     HH Arranged:    [place] Agency:     Status of Service:  Completed, signed off  If discussed at [employer], dates discussed:    Additional Comments:  [name], RN 11/08/2016, 2:20 PM

## 2016-11-08 NOTE — NC FL2 (Signed)
[place] LEVEL OF CARE SCREENING TOOL     IDENTIFICATION  Patient Name: Matthew Berger Birthdate: 22-Feb-1936 Sex: male Admission Date (Current Location): 11/01/2016  [facility] and [place] Number:  Herbalist and Address:  [facility],  [place], [place]      Provider Number: [id number]  Attending Physician Name and Address:  Ankit Arsenio Loader, MD  Relative Name and Phone Number:       Current Level of Care: Hospital Recommended Level of Care: [place] Prior Approval Number:    Date Approved/Denied:   PASRR Number: [id number]  Discharge Plan: SNF    Current Diagnoses: Patient Active Problem List   Diagnosis Date Noted  . Influenza B 11/06/2016  . Protein-calorie malnutrition, severe 11/02/2016  . Hypoxia 11/01/2016  . HCAP (healthcare-associated pneumonia) 11/01/2016  . Acute respiratory failure ([place]) 11/01/2016  . Encounter for antineoplastic immunotherapy 07/12/2016  . SIRS (systemic inflammatory response syndrome) ([place]) 06/28/2016  . Dehydration 06/28/2016  . Atrial fibrillation with rapid ventricular response ([place]) 06/28/2016  . Hyperglycemia 06/28/2016  . Dyspnea 02/04/2016  . Encounter for antineoplastic chemotherapy 01/05/2016  . GERD (gastroesophageal reflux disease) 12/02/2015  . Atrial fibrillation with RVR ([place]) 12/02/2015  . Non-small cell lung cancer (NSCLC) ([place]) 11/19/2015  . Pulmonary nodules/lesions, multiple   . HTN (hypertension) 11/17/2015  . Dyslipidemia 11/17/2015  . BPH (benign prostatic hyperplasia) 11/17/2015    Orientation RESPIRATION BLADDER Height & Weight     Self, Time, Situation, Place  Normal Continent Weight: 227 lb 1.2 oz (103 kg) Height:  '6\' 2"'$  (188 cm)  BEHAVIORAL SYMPTOMS/MOOD NEUROLOGICAL BOWEL NUTRITION STATUS      Continent Diet  AMBULATORY STATUS COMMUNICATION OF NEEDS Skin   Extensive Assist Verbally Normal                        Personal Care Assistance Level of Assistance  Bathing, Dressing Bathing Assistance: Limited assistance   Dressing Assistance: Limited assistance     Functional Limitations Info             SPECIAL CARE FACTORS FREQUENCY  PT (By licensed PT), OT (By licensed OT)     PT Frequency: 5 OT Frequency: 5            Contractures      Additional Factors Info  Code Status, Allergies Code Status Info: DNR Allergies Info: NKDA           Current Medications (11/08/2016):  This is the current hospital active medication list Current Facility-Administered Medications  Medication Dose Route Frequency Provider Last Rate Last Dose  . 0.9 %  sodium chloride infusion   Intravenous Continuous Ankit Arsenio Loader, MD 10 mL/hr at 11/07/16 1313    . acetaminophen (TYLENOL) tablet 650 mg  650 mg Oral Q6H PRN Albertine Patricia, MD   650 mg at 11/05/16 2209   Or  . acetaminophen (TYLENOL) suppository 650 mg  650 mg Rectal Q6H PRN Silver Huguenin Elgergawy, MD      . albuterol (PROVENTIL) (2.5 MG/3ML) 0.083% nebulizer solution 2.5 mg  2.5 mg Nebulization Q2H PRN Albertine Patricia, MD      . amiodarone (PACERONE) tablet 200 mg  200 mg Oral Daily Albertine Patricia, MD   200 mg at 11/07/16 1136  . aspirin EC tablet 81 mg  81 mg Oral Daily Albertine Patricia, MD   81 mg at 11/07/16 1136  . dorzolamide (  TRUSOPT) 2 % ophthalmic solution 1 drop  1 drop Left Eye BID Albertine Patricia, MD   1 drop at 11/07/16 2141  . enoxaparin (LOVENOX) injection 40 mg  40 mg Subcutaneous Q24H Florencia Reasons, MD   40 mg at 11/07/16 1136  . feeding supplement (ENSURE ENLIVE) (ENSURE ENLIVE) liquid 237 mL  237 mL Oral TID BM Florencia Reasons, MD   237 mL at 11/07/16 2026  . finasteride (PROSCAR) tablet 5 mg  5 mg Oral Daily Albertine Patricia, MD   5 mg at 11/07/16 1136  . fluticasone (FLONASE) 50 MCG/ACT nasal spray 1 spray  1 spray Each Nare Daily Florencia Reasons, MD   1 spray at 11/07/16 1138  . folic acid (FOLVITE) tablet 1 mg  1 mg Oral Daily  Albertine Patricia, MD   1 mg at 11/07/16 1136  . guaiFENesin (MUCINEX) 12 hr tablet 600 mg  600 mg Oral BID Florencia Reasons, MD   600 mg at 11/07/16 2139  . guaiFENesin-dextromethorphan (ROBITUSSIN DM) 100-10 MG/5ML syrup 20 mL  20 mL Oral Q4H PRN Silver Huguenin Elgergawy, MD      . ipratropium-albuterol (DUONEB) 0.5-2.5 (3) MG/3ML nebulizer solution 3 mL  3 mL Nebulization TID Florencia Reasons, MD   3 mL at 11/08/16 0836  . latanoprost (XALATAN) 0.005 % ophthalmic solution 1 drop  1 drop Both Eyes QHS Albertine Patricia, MD   1 drop at 11/07/16 2140  . metoprolol tartrate (LOPRESSOR) tablet 12.5 mg  12.5 mg Oral BID Florencia Reasons, MD   12.5 mg at 11/07/16 2139  . ondansetron (ZOFRAN) injection 4 mg  4 mg Intravenous Q6H PRN Jani Gravel, MD   4 mg at 11/04/16 1957  . tamsulosin (FLOMAX) capsule 0.4 mg  0.4 mg Oral Daily Albertine Patricia, MD   0.4 mg at 11/07/16 1135  . vitamin B-12 (CYANOCOBALAMIN) tablet 1,000 mcg  1,000 mcg Oral Daily Albertine Patricia, MD   1,000 mcg at 11/07/16 1134     Discharge Medications: Please see discharge summary for a list of discharge medications.  Relevant Imaging Results:  Relevant Lab Results:   Additional Information SSN: [id number]  Standley Brooking, LCSW

## 2016-11-09 ENCOUNTER — Non-Acute Institutional Stay (SKILLED_NURSING_FACILITY): Payer: Medicare Other | Admitting: Internal Medicine

## 2016-11-09 ENCOUNTER — Encounter: Payer: Self-pay | Admitting: Internal Medicine

## 2016-11-09 DIAGNOSIS — J1100 Influenza due to unidentified influenza virus with unspecified type of pneumonia: Secondary | ICD-10-CM | POA: Diagnosis not present

## 2016-11-09 DIAGNOSIS — I5033 Acute on chronic diastolic (congestive) heart failure: Secondary | ICD-10-CM | POA: Diagnosis not present

## 2016-11-09 DIAGNOSIS — I471 Supraventricular tachycardia: Secondary | ICD-10-CM | POA: Diagnosis not present

## 2016-11-09 DIAGNOSIS — C3492 Malignant neoplasm of unspecified part of left bronchus or lung: Secondary | ICD-10-CM | POA: Diagnosis not present

## 2016-11-09 DIAGNOSIS — J189 Pneumonia, unspecified organism: Secondary | ICD-10-CM | POA: Diagnosis not present

## 2016-11-09 DIAGNOSIS — N400 Enlarged prostate without lower urinary tract symptoms: Secondary | ICD-10-CM | POA: Diagnosis not present

## 2016-11-09 DIAGNOSIS — I4719 Other supraventricular tachycardia: Secondary | ICD-10-CM | POA: Insufficient documentation

## 2016-11-09 DIAGNOSIS — J101 Influenza due to other identified influenza virus with other respiratory manifestations: Secondary | ICD-10-CM | POA: Diagnosis not present

## 2016-11-09 DIAGNOSIS — I480 Paroxysmal atrial fibrillation: Secondary | ICD-10-CM | POA: Diagnosis not present

## 2016-11-09 DIAGNOSIS — I10 Essential (primary) hypertension: Secondary | ICD-10-CM

## 2016-11-09 DIAGNOSIS — E876 Hypokalemia: Secondary | ICD-10-CM | POA: Diagnosis not present

## 2016-11-09 DIAGNOSIS — J9601 Acute respiratory failure with hypoxia: Secondary | ICD-10-CM | POA: Diagnosis not present

## 2016-11-09 LAB — CULTURE, BLOOD (ROUTINE X 2)
CULTURE: NO GROWTH
Culture: NO GROWTH

## 2016-11-09 NOTE — Progress Notes (Signed)
: Provider:  [facility] Location:  [place] [facility] [place] Room Number: 825K Place of Service:  SNF (31)  PCP: [name], MD Patient Care Team: [name], MD as PCP - General (Internal Medicine)  Extended Emergency Contact Information Primary Emergency Contact: Ensey,Iola Address: [address], [place] [address] [place] Phone: [phone] Relation: Spouse Secondary Emergency Contact: Stewart,Betty  [place] Phone: [phone] Relation: Sister     Allergies: Patient has no known allergies.  Chief Complaint  Patient presents with  . New Admit To SNF    Admit to Facility    HPI: Patient is 81 y.o. male who is currently being treated for stage IV lung cancer with metastasis, history of GERD, hypertension, who presented to the ED from oncology clinic, with 48 hour history of fever, cough, productive sputum, shortness of breath and flu-like symptoms.Upon his admission he was started on broad-spectrum antibiotics which was later changed to linezolid and meropenem with recommendations of infectious disease. He was also diagnosed of FLU positive therefore received full course treatment of Tamiflu. CT of the chest was done which showed significant progression of his pulmonary disease with numerous groundglass opacity in upper and lower lobes bilaterally suggesting possible pneumonia. Over the course of next 2 days his breathing improved he remained afebrile and his antibiotics were stopped after 3 days. He was also seen by physical therapy but did get short of breath when moving from his bed to the chair also with ambulation his pulse ox dropped to mid 80s. I spoke with the son who said patient normally uses 2 L of oxygen at home at night which he has been doing well with. Pt is admitted to SNF for OT/PT. WHile at SNF pt will be followed for HTN, tx with diltiazem 180 24 hr and metoprolol 50 mg  BID, BPH, tx with flomax and MAT, tx with amiodarone, diltiazem and metoprolol 50 mg BID.  Past Medical History:  Diagnosis Date  . Atrial fibrillation ([place])   . CHF (congestive heart failure) ([place])   . Encounter for antineoplastic chemotherapy 01/05/2016  . Encounter for antineoplastic immunotherapy 07/12/2016  . GERD (gastroesophageal reflux disease)   . Hypercholesteremia   . Hypertension   . Non-small cell lung cancer (NSCLC) ([place]) 11/19/15  . Pneumonia 11/18/2015    Past Surgical History:  Procedure Laterality Date  . CATARACT EXTRACTION Bilateral   . ROTATOR CUFF REPAIR Right   . VIDEO BRONCHOSCOPY Bilateral 11/19/2015   Procedure: VIDEO BRONCHOSCOPY WITH FLUORO;  Surgeon: Rigoberto Noel, MD;  Location: [facility];  Service: Cardiopulmonary;  Laterality: Bilateral;  . WISDOM TOOTH EXTRACTION      Allergies as of 11/09/2016   No Known Allergies     Medication List       Accurate as of 11/09/16  9:04 AM. Always use your most recent med list.          amiodarone 200 MG tablet Commonly known as:  PACERONE Take 200 mg by mouth daily. Please call cardiology Dr. Einar Gip for refills   aspirin 81 MG tablet Take 81 mg by mouth daily.   diltiazem 180 MG 24 hr capsule Commonly known as:  CARDIZEM CD Take 1 capsule (180 mg total) by mouth 2 (two) times daily.   dorzolamide 2 % ophthalmic solution Commonly known as:  TRUSOPT Place 1 drop into the left eye 2 (two) times  daily.   finasteride 5 MG tablet Commonly known as:  PROSCAR Take 5 mg by mouth daily.   FLOMAX 0.4 MG Caps capsule Generic drug:  tamsulosin Take 0.4 mg by mouth daily.   folic acid 1 MG tablet Commonly known as:  FOLVITE TAKE 1 TABLET (1 MG TOTAL) BY MOUTH DAILY.   guaiFENesin-dextromethorphan 100-10 MG/5ML syrup Commonly known as:  ROBITUSSIN DM Take 20 mLs by mouth every 4 (four) hours as needed for cough.   ipratropium-albuterol 0.5-2.5 (3) MG/3ML Soln Commonly known as:  DUONEB Take 3 mLs by  nebulization every 6 (six) hours as needed.   latanoprost 0.005 % ophthalmic solution Commonly known as:  XALATAN Place 1 drop into both eyes at bedtime.   metoprolol 50 MG tablet Commonly known as:  LOPRESSOR Take 1 tablet (50 mg total) by mouth 2 (two) times daily.   oseltamivir 75 MG capsule Commonly known as:  TAMIFLU Take 75 mg by mouth daily.   prochlorperazine 10 MG tablet Commonly known as:  COMPAZINE TAKE 1 TABLET BY MOUTH EVERY 6 HOURS AS NEEDED FOR NAUSEA AND VOMITING   SIMBRINZA 1-0.2 % Susp Generic drug:  Brinzolamide-Brimonidine Apply 2 drops to eye 2 (two) times daily.   vitamin B-12 1000 MCG tablet Commonly known as:  CYANOCOBALAMIN Take 1,000 mcg by mouth daily.       Meds ordered this encounter  Medications  . amiodarone (PACERONE) 200 MG tablet    Sig: Take 200 mg by mouth daily. Please call cardiology Dr. Einar Gip for refills  . oseltamivir (TAMIFLU) 75 MG capsule    Sig: Take 75 mg by mouth daily.    Immunization History  Administered Date(s) Administered  . Influenza, High Dose Seasonal PF 08/18/2016  . Influenza-Unspecified 07/01/2015  . PPD Test 11/08/2016    Social History  Substance Use Topics  . Smoking status: Former Smoker    Packs/day: 2.00    Years: 40.00    Types: Cigarettes    Quit date: 10/02/1990  . Smokeless tobacco: Never Used     Comment: smoked 1-2 ppd.   . Alcohol use No    Family history is   Family History  Problem Relation Age of Onset  . Breast cancer Sister   . Stroke Sister   . Colon cancer Brother   . Cancer Maternal Aunt     x2  . Hypertension Mother   . Stroke Mother   . Breast cancer Cousin   . Diabetes Other   . Hypertension Other   . Hyperlipidemia Other       Review of Systems  DATA OBTAINED: from patient, nurse GENERAL:  no fevers, fatigue, appetite changes SKIN: No itching, or rash EYES: No eye pain, redness, discharge EARS: No earache, tinnitus, change in hearing NOSE: No congestion,  drainage or bleeding  MOUTH/THROAT: No mouth or tooth pain, No sore throat RESPIRATORY: No cough, wheezing, SOB CARDIAC: No chest pain, palpitations, lower extremity edema  GI: No abdominal pain, No N/V/D or constipation, No heartburn or reflux  GU: No dysuria, frequency or urgency, or incontinence  MUSCULOSKELETAL: No unrelieved bone/joint pain NEUROLOGIC: No headache, dizziness or focal weakness PSYCHIATRIC: No c/o anxiety or sadness   Vitals:   11/09/16 0843  BP: 112/60  Pulse: 94  Resp: 18  Temp: 98.5 F (36.9 C)    SpO2 Readings from Last 1 Encounters:  11/09/16 95%   Body mass index is 29.15 kg/m.     Physical Exam  GENERAL APPEARANCE: taking a nap,  min conversant,  No acute distress.  SKIN: No diaphoresis rash HEAD: Normocephalic, atraumatic  EYES: Conjunctiva/lids clear. Pupils round, reactive. EOMs intact.  EARS: External exam WNL, canals clear. Hearing grossly normal.  NOSE: No deformity or discharge.  MOUTH/THROAT: Lips w/o lesions  RESPIRATORY: Breathing is even, unlabored. Lung sounds are clear   CARDIOVASCULAR: Heart RRR no murmurs, rubs or gallops. No peripheral edema.   GASTROINTESTINAL: Abdomen is soft, non-tender, not distended w/ normal bowel sounds. GENITOURINARY: Bladder non tender, not distended  MUSCULOSKELETAL: No abnormal joints or musculature NEUROLOGIC:  Cranial nerves 2-12 grossly intact. Moves all extremities  PSYCHIATRIC: Mood and affect appropriate to situation, no behavioral issues  Patient Active Problem List   Diagnosis Date Noted  . Influenza B 11/06/2016  . Protein-calorie malnutrition, severe 11/02/2016  . Hypoxia 11/01/2016  . HCAP (healthcare-associated pneumonia) 11/01/2016  . Acute respiratory failure ([place]) 11/01/2016  . Encounter for antineoplastic immunotherapy 07/12/2016  . SIRS (systemic inflammatory response syndrome) ([place]) 06/28/2016  . Dehydration 06/28/2016  . Atrial fibrillation with rapid ventricular response  ([place]) 06/28/2016  . Hyperglycemia 06/28/2016  . Dyspnea 02/04/2016  . Encounter for antineoplastic chemotherapy 01/05/2016  . GERD (gastroesophageal reflux disease) 12/02/2015  . Atrial fibrillation with RVR ([place]) 12/02/2015  . Non-small cell lung cancer (NSCLC) ([place]) 11/19/2015  . Pulmonary nodules/lesions, multiple   . HTN (hypertension) 11/17/2015  . Dyslipidemia 11/17/2015  . BPH (benign prostatic hyperplasia) 11/17/2015      Labs reviewed: Basic Metabolic Panel:    Component Value Date/Time   NA 138 11/08/2016 0505   NA 138 11/08/2016   NA 140 11/01/2016 0913   K 3.3 (L) 11/08/2016 0505   K 3.6 11/01/2016 0913   CL 108 11/08/2016 0505   CO2 23 11/08/2016 0505   CO2 24 11/01/2016 0913   GLUCOSE 105 (H) 11/08/2016 0505   GLUCOSE 136 11/01/2016 0913   BUN 16 11/08/2016 0505   BUN 16 11/08/2016   BUN 15.8 11/01/2016 0913   CREATININE 1.01 11/08/2016 0505   CREATININE 1.4 (H) 11/01/2016 0913   CALCIUM 8.7 (L) 11/08/2016 0505   CALCIUM 9.7 11/01/2016 0913   PROT 6.0 (L) 11/05/2016 0542   PROT 7.6 11/01/2016 0913   ALBUMIN 2.6 (L) 11/05/2016 0542   ALBUMIN 3.3 (L) 11/01/2016 0913   AST 87 (H) 11/05/2016 0542   AST 23 11/01/2016 0913   ALT 22 11/05/2016 0542   ALT 13 11/01/2016 0913   ALKPHOS 42 11/05/2016 0542   ALKPHOS 70 11/01/2016 0913   BILITOT 0.4 11/05/2016 0542   BILITOT 0.39 11/01/2016 0913   GFRNONAA >60 11/08/2016 0505   GFRAA >60 11/08/2016 0505     Recent Labs  06/28/16 1443  11/05/16 0542 11/06/16 0447 11/08/16 11/08/16 0505  NA  --   < > 134* 138  137 138 138  K  --   < > 3.3* 3.8  3.3*  --  3.3*  CL  --   < > 106 109  --  108  CO2  --   < > 22 21*  --  23  GLUCOSE  --   < > 136* 99  --  105*  BUN  --   < > '13 16  17 16 16  '$ CREATININE  --   < > 1.20 1.17  1.0 1.0 1.01  CALCIUM  --   < > 8.2* 8.1*  --  8.7*  MG 1.8  < > 1.8 1.9  --  2.0  PHOS 2.5  --   --   --   --   --   < > =  values in this interval not displayed. Liver Function  Tests:  Recent Labs  11/03/16 0409 11/04/16 1511 11/05/16 0542  AST 37  37 76*  76* 87*  ALT 16*  '16 20  20 22  '$ ALKPHOS 40  40 46  46 42  BILITOT 0.6 0.7 0.4  PROT 5.7* 6.7 6.0*  ALBUMIN 2.7* 3.0* 2.6*    Recent Labs  06/28/16 0946  LIPASE 18   No results for input(s): AMMONIA in the last 8760 hours. CBC:  Recent Labs  11/01/16 1102  11/03/16 0409  11/04/16 0643 11/05/16 11/05/16 0542 11/08/16 11/08/16 0505  WBC 5.5  < > 3.2  3.2*  < > 2.8* 3.5 3.5* 3.8 3.8*  NEUTROABS 4.7  --  2.4  --  2.1  --   --   --   --   HGB 12.6*  < > 10.1*  10.1*  < > 10.8* 11.3* 11.3*  --  10.8*  HCT 37.1*  < > 29.9*  30*  < > 31.7* 33* 32.7*  --  31.8*  MCV 88.1  < > 88.2  --  87.8  --  84.7  --  85.3  PLT 175  < > 125*  125*  < > 118* 100* 100*  --  126*  < > = values in this interval not displayed. Lipid No results for input(s): CHOL, HDL, LDLCALC, TRIG in the last 8760 hours.  Cardiac Enzymes: No results for input(s): CKTOTAL, CKMB, CKMBINDEX, TROPONINI in the last 8760 hours. BNP:  Recent Labs  06/28/16 0946  BNP 123.7*   No results found for: [facility] Lab Results  Component Value Date   HGBA1C (H) 08/21/2010    5.8 (NOTE)                                                                       According to the ADA Clinical Practice Recommendations for 2011, when HbA1c is used as a screening test:   >=6.5%   Diagnostic of Diabetes Mellitus           (if abnormal result  is confirmed)  5.7-6.4%   Increased risk of developing Diabetes Mellitus  References:Diagnosis and Classification of Diabetes Mellitus,Diabetes FMBW,4665,99(JTTSV 1):S62-S69 and Standards of Medical Care in         Diabetes - 2011,Diabetes Care,2011,34  (Suppl 1):S11-S61.   Lab Results  Component Value Date   TSH 0.833 11/01/2016   No results found for: VITAMINB12 No results found for: FOLATE No results found for: IRON, TIBC, FERRITIN  Imaging and Procedures obtained prior to SNF admission: Dg  Chest 2 View  Result Date: 11/01/2016 CLINICAL DATA:  History of lung carcinoma with fever and flu like symptoms EXAM: CHEST  2 VIEW COMPARISON:  09/15/2016 FINDINGS: Cardiac shadow is within normal. Diffuse density noted throughout the left lung base similar to that seen on recent CT examination representing a component of neoplasm and consolidation. No sizable effusion is noted. The nodular changes seen in the right lung are less well appreciated on the current exam. IMPRESSION: Improved aeration in the left base although persistent density is noted similar to that seen on prior CT examination. The previously seen right-sided nodules are less well appreciated on the current study. Electronically  Signed   By: Inez Catalina M.D.   On: 11/01/2016 11:20     Not all labs, radiology exams or other studies done during hospitalization come through on my EPIC note; however they are reviewed by me.    Assessment and Plan  INFLUENZA B/EXPOSURE TO INFLUENZA A -  tx with tamiflu BID for 5 days for treatment; day 5 was 2/4 SNF with influenza A outbreak; will place pt on prophylaxis with Tamiflu - he does not have immunity to Influenza A  POST FLU HCAP/ACUTE RESP FAILURE- tx with linezolid an meropenam for 3 days after he was afebrile for 48 hours SNF - will cont to monitor  ACUTE ON CHRONIC DIASTOLIC CHF - resolved  HYPOKALEMIA/HYPOMAGNESEMIA -  repleted SNF  - will f/u BMP  MAT VS AFIB - stable in hospital SNF - stable ; cont amiodarone 200 mg daily, metoprolol 50 mg BID and diltiazem 24 hr 180 mg daily; prophylaxis with ASA 81 mg daily not on antocoags due prior thrombocytopenia  HTN SNF - controlled ; cont metoprolol 50 mg BID and diltiazem 24 hr 180 mg daily  BPH SNF - stable; cont flomax 0.4 mg daily and proscar 5 mg daily  NON SMALL CELL CA - CT chest 2/3 showed sig progression of dx, esp L lung and B pleural effusions L>R; sclerotic lesion T9 SNF - immunotherapy on hold; admitted to SNF  for generalized weakness for OT/PT    Time spent > 45 min;> 50% of time with patient was spent reviewing records, labs, tests and studies, counseling and developing plan of care  Webb Silversmith D. Sheppard Coil, MD

## 2016-11-10 ENCOUNTER — Encounter: Payer: Self-pay | Admitting: Internal Medicine

## 2016-11-10 ENCOUNTER — Non-Acute Institutional Stay (SKILLED_NURSING_FACILITY): Payer: Medicare Other | Admitting: Internal Medicine

## 2016-11-10 DIAGNOSIS — R110 Nausea: Secondary | ICD-10-CM | POA: Diagnosis not present

## 2016-11-10 NOTE — Progress Notes (Signed)
: Provider:  Noah Delaine. Sheppard Coil, MD Location:  [facility] Room Number: 915A Place of Service:  SNF (31)  PCP: [name], MD Patient Care Team: [name], MD as PCP - General (Internal Medicine)  Extended Emergency Contact Information Primary Emergency Contact: Boutwell,Iola Address: [address], [place] [address] [place] Phone: [phone] Relation: Spouse Secondary Emergency Contact: Stewart,Betty  [place] Phone: [phone] Relation: Sister     Allergies: Patient has no known allergies.  Chief Complaint  Patient presents with  . Acute Visit    Acute     HPI: Patient is 81 y.o. male who nursing asked me to see for c/o nausea after eating for last week. No onserved reflux or coughing. No pain in abd or back.  Past Medical History:  Diagnosis Date  . Atrial fibrillation ([place])   . CHF (congestive heart failure) ([place])   . Encounter for antineoplastic chemotherapy 01/05/2016  . Encounter for antineoplastic immunotherapy 07/12/2016  . GERD (gastroesophageal reflux disease)   . Hypercholesteremia   . Hypertension   . Non-small cell lung cancer (NSCLC) ([place]) 11/19/15  . Pneumonia 11/18/2015    Past Surgical History:  Procedure Laterality Date  . CATARACT EXTRACTION Bilateral   . ROTATOR CUFF REPAIR Right   . VIDEO BRONCHOSCOPY Bilateral 11/19/2015   Procedure: VIDEO BRONCHOSCOPY WITH FLUORO;  Surgeon: Rigoberto Noel, MD;  Location: [facility];  Service: Cardiopulmonary;  Laterality: Bilateral;  . WISDOM TOOTH EXTRACTION      Allergies as of 11/10/2016   No Known Allergies     Medication List       Accurate as of 11/10/16 11:59 PM. Always use your most recent med list.          amiodarone 200 MG tablet Commonly known as:  PACERONE Take 200 mg by mouth daily. Please call cardiology Dr. Einar Gip for refills   aspirin 81 MG tablet Take 81 mg by mouth daily.   diltiazem  180 MG 24 hr capsule Commonly known as:  CARDIZEM CD Take 1 capsule (180 mg total) by mouth 2 (two) times daily.   dorzolamide 2 % ophthalmic solution Commonly known as:  TRUSOPT Place 1 drop into the left eye 2 (two) times daily.   finasteride 5 MG tablet Commonly known as:  PROSCAR Take 5 mg by mouth daily.   FLOMAX 0.4 MG Caps capsule Generic drug:  tamsulosin Take 0.4 mg by mouth daily.   folic acid 1 MG tablet Commonly known as:  FOLVITE TAKE 1 TABLET (1 MG TOTAL) BY MOUTH DAILY.   guaiFENesin-dextromethorphan 100-10 MG/5ML syrup Commonly known as:  ROBITUSSIN DM Take 20 mLs by mouth every 4 (four) hours as needed for cough.   ipratropium-albuterol 0.5-2.5 (3) MG/3ML Soln Commonly known as:  DUONEB Take 3 mLs by nebulization every 6 (six) hours as needed.   latanoprost 0.005 % ophthalmic solution Commonly known as:  XALATAN Place 1 drop into both eyes at bedtime.   metoprolol 50 MG tablet Commonly known as:  LOPRESSOR Take 1 tablet (50 mg total) by mouth 2 (two) times daily.   oseltamivir 75 MG capsule Commonly known as:  TAMIFLU Take 75 mg by mouth daily.   prochlorperazine 10 MG tablet Commonly known as:  COMPAZINE TAKE 1 TABLET BY MOUTH EVERY 6 HOURS AS NEEDED FOR NAUSEA AND VOMITING   SIMBRINZA 1-0.2 % Susp Generic drug:  Brinzolamide-Brimonidine Apply  2 drops to eye 2 (two) times daily.   vitamin B-12 1000 MCG tablet Commonly known as:  CYANOCOBALAMIN Take 1,000 mcg by mouth daily.       No orders of the defined types were placed in this encounter.   Immunization History  Administered Date(s) Administered  . Influenza, High Dose Seasonal PF 08/18/2016  . Influenza-Unspecified 07/01/2015  . PPD Test 11/08/2016    Social History  Substance Use Topics  . Smoking status: Former Smoker    Packs/day: 2.00    Years: 40.00    Types: Cigarettes    Quit date: 10/02/1990  . Smokeless tobacco: Never Used     Comment: smoked 1-2 ppd.   . Alcohol  use No    Family history is   Family History  Problem Relation Age of Onset  . Breast cancer Sister   . Stroke Sister   . Colon cancer Brother   . Cancer Maternal Aunt     x2  . Hypertension Mother   . Stroke Mother   . Breast cancer Cousin   . Diabetes Other   . Hypertension Other   . Hyperlipidemia Other       Review of Systems  DATA OBTAINED: from patient, nurse GENERAL:  no fevers, fatigue, appetite changes SKIN: No itching, or rash EYES: No eye pain, redness, discharge EARS: No earache, tinnitus, change in hearing NOSE: No congestion, drainage or bleeding  MOUTH/THROAT: No mouth or tooth pain, No sore throat RESPIRATORY: No cough, wheezing, SOB CARDIAC: No chest pain, palpitations, lower extremity edema  GI: No abdominal pain, + nauses, No V/D or constipation, No heartburn or reflux  GU: No dysuria, frequency or urgency, or incontinence  MUSCULOSKELETAL: No unrelieved bone/joint pain NEUROLOGIC: No headache, dizziness or focal weakness PSYCHIATRIC: No c/o anxiety or sadness   Vitals:   11/10/16 1353  BP: 112/60  Pulse: 94  Resp: 18  Temp: 98.5 F (36.9 C)    SpO2 Readings from Last 1 Encounters:  11/09/16 95%   Body mass index is 29.15 kg/m.     Physical Exam  GENERAL APPEARANCE: Alert, conversant,  No acute distress.  SKIN: No diaphoresis rash HEAD: Normocephalic, atraumatic  EYES: Conjunctiva/lids clear. Pupils round, reactive. EOMs intact.  EARS: External exam WNL, canals clear. Hearing grossly normal.  NOSE: No deformity or discharge.  MOUTH/THROAT: Lips w/o lesions  RESPIRATORY: Breathing is even, unlabored. Lung sounds are clear   CARDIOVASCULAR: Heart RRR no murmurs, rubs or gallops. No peripheral edema.   GASTROINTESTINAL: Abdomen is soft, non-tender, not distended w/ normal bowel sounds. GENITOURINARY: Bladder non tender, not distended  MUSCULOSKELETAL: No abnormal joints or musculature NEUROLOGIC:  Cranial nerves 2-12 grossly intact.  Moves all extremities  PSYCHIATRIC: Mood and affect appropriate to situation, no behavioral issues  Patient Active Problem List   Diagnosis Date Noted  . Acute on chronic diastolic CHF (congestive heart failure) ([place]) 11/09/2016  . Hypokalemia 11/09/2016  . Hypomagnesemia 11/09/2016  . Multifocal atrial tachycardia ([place]) 11/09/2016  . Influenza B 11/06/2016  . Protein-calorie malnutrition, severe 11/02/2016  . Hypoxia 11/01/2016  . HCAP (healthcare-associated pneumonia) 11/01/2016  . Acute respiratory failure ([place]) 11/01/2016  . Encounter for antineoplastic immunotherapy 07/12/2016  . SIRS (systemic inflammatory response syndrome) ([place]) 06/28/2016  . Dehydration 06/28/2016  . Atrial fibrillation ([place]) 06/28/2016  . Hyperglycemia 06/28/2016  . Dyspnea 02/04/2016  . Encounter for antineoplastic chemotherapy 01/05/2016  . GERD (gastroesophageal reflux disease) 12/02/2015  . Atrial fibrillation with RVR ([place]) 12/02/2015  . Non-small  cell lung cancer (NSCLC) ([place]) 11/19/2015  . Pulmonary nodules/lesions, multiple   . Influenza and pneumonia 11/18/2015  . HTN (hypertension) 11/17/2015  . Dyslipidemia 11/17/2015  . BPH (benign prostatic hyperplasia) 11/17/2015      Labs reviewed: Basic Metabolic Panel:    Component Value Date/Time   NA 138 11/08/2016 0505   NA 138 11/08/2016   NA 140 11/01/2016 0913   K 3.3 (L) 11/08/2016 0505   K 3.6 11/01/2016 0913   CL 108 11/08/2016 0505   CO2 23 11/08/2016 0505   CO2 24 11/01/2016 0913   GLUCOSE 105 (H) 11/08/2016 0505   GLUCOSE 136 11/01/2016 0913   BUN 16 11/08/2016 0505   BUN 16 11/08/2016   BUN 15.8 11/01/2016 0913   CREATININE 1.01 11/08/2016 0505   CREATININE 1.4 (H) 11/01/2016 0913   CALCIUM 8.7 (L) 11/08/2016 0505   CALCIUM 9.7 11/01/2016 0913   PROT 6.0 (L) 11/05/2016 0542   PROT 7.6 11/01/2016 0913   ALBUMIN 2.6 (L) 11/05/2016 0542   ALBUMIN 3.3 (L) 11/01/2016 0913   AST 87 (H) 11/05/2016 0542   AST 23 11/01/2016  0913   ALT 22 11/05/2016 0542   ALT 13 11/01/2016 0913   ALKPHOS 42 11/05/2016 0542   ALKPHOS 70 11/01/2016 0913   BILITOT 0.4 11/05/2016 0542   BILITOT 0.39 11/01/2016 0913   GFRNONAA >60 11/08/2016 0505   GFRAA >60 11/08/2016 0505     Recent Labs  06/28/16 1443  11/05/16 0542 11/06/16 0447 11/08/16 11/08/16 0505  NA  --   < > 134* 138  137 138 138  K  --   < > 3.3* 3.8  3.3*  --  3.3*  CL  --   < > 106 109  --  108  CO2  --   < > 22 21*  --  23  GLUCOSE  --   < > 136* 99  --  105*  BUN  --   < > '13 16  17 16 16  '$ CREATININE  --   < > 1.20 1.17  1.0 1.0 1.01  CALCIUM  --   < > 8.2* 8.1*  --  8.7*  MG 1.8  < > 1.8 1.9  --  2.0  PHOS 2.5  --   --   --   --   --   < > = values in this interval not displayed. Liver Function Tests:  Recent Labs  11/03/16 0409 11/04/16 1511 11/05/16 0542  AST 37  37 76*  76* 87*  ALT 16*  '16 20  20 22  '$ ALKPHOS 40  40 46  46 42  BILITOT 0.6 0.7 0.4  PROT 5.7* 6.7 6.0*  ALBUMIN 2.7* 3.0* 2.6*    Recent Labs  06/28/16 0946  LIPASE 18   No results for input(s): AMMONIA in the last 8760 hours. CBC:  Recent Labs  11/01/16 1102  11/03/16 0409  11/04/16 0643 11/05/16 11/05/16 0542 11/08/16 11/08/16 0505  WBC 5.5  < > 3.2  3.2*  < > 2.8* 3.5 3.5* 3.8 3.8*  NEUTROABS 4.7  --  2.4  --  2.1  --   --   --   --   HGB 12.6*  < > 10.1*  10.1*  < > 10.8* 11.3* 11.3*  --  10.8*  HCT 37.1*  < > 29.9*  30*  < > 31.7* 33* 32.7*  --  31.8*  MCV 88.1  < > 88.2  --  87.8  --  84.7  --  85.3  PLT 175  < > 125*  125*  < > 118* 100* 100*  --  126*  < > = values in this interval not displayed. Lipid No results for input(s): CHOL, HDL, LDLCALC, TRIG in the last 8760 hours.  Cardiac Enzymes: No results for input(s): CKTOTAL, CKMB, CKMBINDEX, TROPONINI in the last 8760 hours. BNP:  Recent Labs  06/28/16 0946  BNP 123.7*   No results found for: [facility] Lab Results  Component Value Date   HGBA1C (H) 08/21/2010     5.8 (NOTE)                                                                       According to the ADA Clinical Practice Recommendations for 2011, when HbA1c is used as a screening test:   >=6.5%   Diagnostic of Diabetes Mellitus           (if abnormal result  is confirmed)  5.7-6.4%   Increased risk of developing Diabetes Mellitus  References:Diagnosis and Classification of Diabetes Mellitus,Diabetes [facility],9470,96(GEZMO 1):S62-S69 and Standards of Medical Care in         Diabetes - 2011,Diabetes Care,2011,34  (Suppl 1):S11-S61.   Lab Results  Component Value Date   TSH 0.833 11/01/2016   No results found for: VITAMINB12 No results found for: FOLATE No results found for: IRON, TIBC, FERRITIN  Imaging and Procedures obtained prior to SNF admission: Dg Chest 2 View  Result Date: 11/01/2016 CLINICAL DATA:  History of lung carcinoma with fever and flu like symptoms EXAM: CHEST  2 VIEW COMPARISON:  09/15/2016 FINDINGS: Cardiac shadow is within normal. Diffuse density noted throughout the left lung base similar to that seen on recent CT examination representing a component of neoplasm and consolidation. No sizable effusion is noted. The nodular changes seen in the right lung are less well appreciated on the current exam. IMPRESSION: Improved aeration in the left base although persistent density is noted similar to that seen on prior CT examination. The previously seen right-sided nodules are less well appreciated on the current study. Electronically Signed   By: Inez Catalina M.D.   On: 11/01/2016 11:20     Not all labs, radiology exams or other studies done during hospitalization come through on my EPIC note; however they are reviewed by me.    Assessment and Plan  POST PRANDNIAL  NAUSEA - have written for zofran and as there is concern for gastritis or reflux have started pt on omeprazole 40 mg daily; will monitor    Time spent > 25 min;> 50% of time with patient was spent reviewing records,  labs, tests and studies, counseling and developing plan of care  Webb Silversmith D. Sheppard Coil;, MD

## 2016-11-13 LAB — CBC AND DIFFERENTIAL
HCT: 37 % — AB (ref 41–53)
Hemoglobin: 11.7 g/dL — AB (ref 13.5–17.5)
Platelets: 195 10*3/uL (ref 150–399)
WBC: 8.5 10^3/mL

## 2016-11-13 NOTE — Telephone Encounter (Signed)
lmtcb X1 for pt to make aware of recs.  Will route to my inbasket for follow up.

## 2016-11-14 ENCOUNTER — Inpatient Hospital Stay (HOSPITAL_COMMUNITY): Payer: Medicare Other

## 2016-11-14 ENCOUNTER — Telehealth: Payer: Self-pay | Admitting: Medical Oncology

## 2016-11-14 ENCOUNTER — Emergency Department (HOSPITAL_COMMUNITY): Payer: Medicare Other

## 2016-11-14 ENCOUNTER — Non-Acute Institutional Stay (SKILLED_NURSING_FACILITY): Payer: Medicare Other | Admitting: Internal Medicine

## 2016-11-14 ENCOUNTER — Inpatient Hospital Stay (HOSPITAL_COMMUNITY)
Admission: EM | Admit: 2016-11-14 | Discharge: 2016-11-18 | DRG: 175 | Disposition: A | Discharge status: Hospice | Payer: Medicare Other | Attending: Internal Medicine | Admitting: Internal Medicine

## 2016-11-14 ENCOUNTER — Encounter: Payer: Self-pay | Admitting: Internal Medicine

## 2016-11-14 ENCOUNTER — Encounter (HOSPITAL_COMMUNITY): Payer: Self-pay

## 2016-11-14 ENCOUNTER — Encounter: Payer: Self-pay | Admitting: Medical Oncology

## 2016-11-14 DIAGNOSIS — J440 Chronic obstructive pulmonary disease with acute lower respiratory infection: Secondary | ICD-10-CM | POA: Diagnosis present

## 2016-11-14 DIAGNOSIS — D696 Thrombocytopenia, unspecified: Secondary | ICD-10-CM | POA: Diagnosis not present

## 2016-11-14 DIAGNOSIS — J9601 Acute respiratory failure with hypoxia: Secondary | ICD-10-CM | POA: Diagnosis present

## 2016-11-14 DIAGNOSIS — E876 Hypokalemia: Secondary | ICD-10-CM

## 2016-11-14 DIAGNOSIS — K219 Gastro-esophageal reflux disease without esophagitis: Secondary | ICD-10-CM | POA: Diagnosis present

## 2016-11-14 DIAGNOSIS — I10 Essential (primary) hypertension: Secondary | ICD-10-CM | POA: Diagnosis not present

## 2016-11-14 DIAGNOSIS — I2699 Other pulmonary embolism without acute cor pulmonale: Secondary | ICD-10-CM | POA: Diagnosis not present

## 2016-11-14 DIAGNOSIS — J189 Pneumonia, unspecified organism: Secondary | ICD-10-CM | POA: Diagnosis present

## 2016-11-14 DIAGNOSIS — E7800 Pure hypercholesterolemia, unspecified: Secondary | ICD-10-CM | POA: Diagnosis present

## 2016-11-14 DIAGNOSIS — R0602 Shortness of breath: Secondary | ICD-10-CM | POA: Diagnosis not present

## 2016-11-14 DIAGNOSIS — J101 Influenza due to other identified influenza virus with other respiratory manifestations: Secondary | ICD-10-CM

## 2016-11-14 DIAGNOSIS — Z20828 Contact with and (suspected) exposure to other viral communicable diseases: Secondary | ICD-10-CM | POA: Diagnosis not present

## 2016-11-14 DIAGNOSIS — Z515 Encounter for palliative care: Secondary | ICD-10-CM

## 2016-11-14 DIAGNOSIS — Z7189 Other specified counseling: Secondary | ICD-10-CM | POA: Diagnosis not present

## 2016-11-14 DIAGNOSIS — Z7982 Long term (current) use of aspirin: Secondary | ICD-10-CM | POA: Diagnosis not present

## 2016-11-14 DIAGNOSIS — N400 Enlarged prostate without lower urinary tract symptoms: Secondary | ICD-10-CM | POA: Diagnosis present

## 2016-11-14 DIAGNOSIS — R0902 Hypoxemia: Secondary | ICD-10-CM

## 2016-11-14 DIAGNOSIS — Z79899 Other long term (current) drug therapy: Secondary | ICD-10-CM | POA: Diagnosis not present

## 2016-11-14 DIAGNOSIS — Z9841 Cataract extraction status, right eye: Secondary | ICD-10-CM | POA: Diagnosis not present

## 2016-11-14 DIAGNOSIS — Z87891 Personal history of nicotine dependence: Secondary | ICD-10-CM | POA: Diagnosis not present

## 2016-11-14 DIAGNOSIS — C3492 Malignant neoplasm of unspecified part of left bronchus or lung: Secondary | ICD-10-CM

## 2016-11-14 DIAGNOSIS — I480 Paroxysmal atrial fibrillation: Secondary | ICD-10-CM

## 2016-11-14 DIAGNOSIS — C7951 Secondary malignant neoplasm of bone: Secondary | ICD-10-CM | POA: Diagnosis present

## 2016-11-14 DIAGNOSIS — I4719 Other supraventricular tachycardia: Secondary | ICD-10-CM

## 2016-11-14 DIAGNOSIS — Z9842 Cataract extraction status, left eye: Secondary | ICD-10-CM

## 2016-11-14 DIAGNOSIS — I110 Hypertensive heart disease with heart failure: Secondary | ICD-10-CM | POA: Diagnosis present

## 2016-11-14 DIAGNOSIS — I5032 Chronic diastolic (congestive) heart failure: Secondary | ICD-10-CM | POA: Diagnosis present

## 2016-11-14 DIAGNOSIS — D638 Anemia in other chronic diseases classified elsewhere: Secondary | ICD-10-CM | POA: Diagnosis present

## 2016-11-14 DIAGNOSIS — I4891 Unspecified atrial fibrillation: Secondary | ICD-10-CM | POA: Diagnosis present

## 2016-11-14 DIAGNOSIS — I471 Supraventricular tachycardia: Secondary | ICD-10-CM | POA: Diagnosis present

## 2016-11-14 DIAGNOSIS — R079 Chest pain, unspecified: Secondary | ICD-10-CM

## 2016-11-14 DIAGNOSIS — I5033 Acute on chronic diastolic (congestive) heart failure: Secondary | ICD-10-CM | POA: Diagnosis not present

## 2016-11-14 DIAGNOSIS — C3490 Malignant neoplasm of unspecified part of unspecified bronchus or lung: Secondary | ICD-10-CM | POA: Diagnosis present

## 2016-11-14 DIAGNOSIS — Y95 Nosocomial condition: Secondary | ICD-10-CM | POA: Diagnosis present

## 2016-11-14 DIAGNOSIS — R627 Adult failure to thrive: Secondary | ICD-10-CM | POA: Diagnosis present

## 2016-11-14 DIAGNOSIS — J9600 Acute respiratory failure, unspecified whether with hypoxia or hypercapnia: Secondary | ICD-10-CM | POA: Diagnosis present

## 2016-11-14 DIAGNOSIS — I959 Hypotension, unspecified: Secondary | ICD-10-CM

## 2016-11-14 LAB — I-STAT TROPONIN, ED: TROPONIN I, POC: 0.01 ng/mL (ref 0.00–0.08)

## 2016-11-14 LAB — CBC
HCT: 32.9 % — ABNORMAL LOW (ref 39.0–52.0)
Hemoglobin: 11.1 g/dL — ABNORMAL LOW (ref 13.0–17.0)
MCH: 29.4 pg (ref 26.0–34.0)
MCHC: 33.7 g/dL (ref 30.0–36.0)
MCV: 87.3 fL (ref 78.0–100.0)
Platelets: 190 10*3/uL (ref 150–400)
RBC: 3.77 MIL/uL — ABNORMAL LOW (ref 4.22–5.81)
RDW: 14.3 % (ref 11.5–15.5)
WBC: 11.3 10*3/uL — ABNORMAL HIGH (ref 4.0–10.5)

## 2016-11-14 LAB — COMPREHENSIVE METABOLIC PANEL
ALBUMIN: 2.6 g/dL — AB (ref 3.5–5.0)
ALK PHOS: 49 U/L (ref 38–126)
ALT: 19 U/L (ref 17–63)
ANION GAP: 10 (ref 5–15)
AST: 20 U/L (ref 15–41)
BUN: 13 mg/dL (ref 6–20)
CALCIUM: 8.7 mg/dL — AB (ref 8.9–10.3)
CO2: 23 mmol/L (ref 22–32)
Chloride: 106 mmol/L (ref 101–111)
Creatinine, Ser: 1.21 mg/dL (ref 0.61–1.24)
GFR calc Af Amer: >60 mL/min (ref >60)
GFR calc non Af Amer: 56 mL/min — ABNORMAL LOW (ref >60)
GLUCOSE: 138 mg/dL — AB (ref 65–99)
POTASSIUM: 3.2 mmol/L — AB (ref 3.5–5.1)
SODIUM: 139 mmol/L (ref 135–145)
TOTAL PROTEIN: 6.1 g/dL — AB (ref 6.5–8.1)
Total Bilirubin: 0.9 mg/dL (ref 0.3–1.2)

## 2016-11-14 LAB — I-STAT CG4 LACTIC ACID, ED: Lactic Acid, Venous: 1.86 mmol/L (ref 0.5–1.9)

## 2016-11-14 MED ORDER — ACETAMINOPHEN 650 MG RE SUPP: 650.0000 mg | Freq: Four times a day (QID) | RECTAL | Status: DC | PRN

## 2016-11-14 MED ORDER — BISACODYL 10 MG RE SUPP
10.0000 mg | RECTAL | Status: DC | PRN
  Administered 2016-11-16: 10 mg via RECTAL
  Filled 2016-11-14: qty 1

## 2016-11-14 MED ORDER — DORZOLAMIDE HCL 2 % OP SOLN
1.0000 [drp] | Freq: Two times a day (BID) | OPHTHALMIC | Status: DC
  Administered 2016-11-15 – 2016-11-18 (×7): 1 [drp] via OPHTHALMIC
  Filled 2016-11-14 (×2): qty 10

## 2016-11-14 MED ORDER — ONDANSETRON HCL 4 MG PO TABS
4.0000 mg | ORAL_TABLET | Freq: Three times a day (TID) | ORAL | Status: DC | PRN
Start: 2016-11-14 — End: 2016-11-15

## 2016-11-14 MED ORDER — IPRATROPIUM-ALBUTEROL 0.5-2.5 (3) MG/3ML IN SOLN: 3.0000 mL | Freq: Four times a day (QID) | RESPIRATORY_TRACT | Status: DC | PRN

## 2016-11-14 MED ORDER — METOPROLOL TARTRATE 25 MG PO TABS
50.0000 mg | ORAL_TABLET | Freq: Two times a day (BID) | ORAL | Status: DC
  Administered 2016-11-14 – 2016-11-17 (×4): 50 mg via ORAL
  Filled 2016-11-14 (×6): qty 2

## 2016-11-14 MED ORDER — DEXTROSE 5 % IV SOLN
2.0000 g | Freq: Once | INTRAVENOUS | Status: AC
  Administered 2016-11-14: 2 g via INTRAVENOUS
  Filled 2016-11-14: qty 2

## 2016-11-14 MED ORDER — PIPERACILLIN-TAZOBACTAM 3.375 G IVPB 30 MIN
3.3750 g | Freq: Once | INTRAVENOUS | Status: DC
  Filled 2016-11-14: qty 50

## 2016-11-14 MED ORDER — DILTIAZEM HCL ER COATED BEADS 180 MG PO CP24
180.0000 mg | ORAL_CAPSULE | Freq: Two times a day (BID) | ORAL | Status: DC
  Administered 2016-11-15 (×3): 180 mg via ORAL
  Filled 2016-11-14 (×5): qty 1

## 2016-11-14 MED ORDER — PANTOPRAZOLE SODIUM 40 MG PO TBEC
40.0000 mg | DELAYED_RELEASE_TABLET | Freq: Every day | ORAL | Status: DC
  Administered 2016-11-15 – 2016-11-17 (×3): 40 mg via ORAL
  Filled 2016-11-14 (×3): qty 1

## 2016-11-14 MED ORDER — DEXTROSE 5 % IV SOLN
2.0000 g | Freq: Three times a day (TID) | INTRAVENOUS | Status: DC
  Filled 2016-11-14: qty 2

## 2016-11-14 MED ORDER — ONDANSETRON HCL 4 MG/2ML IJ SOLN
4.0000 mg | Freq: Four times a day (QID) | INTRAMUSCULAR | Status: DC | PRN
  Administered 2016-11-16: 4 mg via INTRAVENOUS
  Filled 2016-11-14: qty 2

## 2016-11-14 MED ORDER — FOLIC ACID 1 MG PO TABS
1.0000 mg | ORAL_TABLET | Freq: Every day | ORAL | Status: DC
  Administered 2016-11-15 – 2016-11-17 (×3): 1 mg via ORAL
  Filled 2016-11-14 (×3): qty 1

## 2016-11-14 MED ORDER — TAMSULOSIN HCL 0.4 MG PO CAPS
0.4000 mg | ORAL_CAPSULE | Freq: Every day | ORAL | Status: DC
Start: 2016-11-15 — End: 2016-11-18
  Administered 2016-11-15 – 2016-11-17 (×3): 0.4 mg via ORAL
  Filled 2016-11-14 (×3): qty 1

## 2016-11-14 MED ORDER — LATANOPROST 0.005 % OP SOLN
1.0000 [drp] | Freq: Every day | OPHTHALMIC | Status: DC
  Administered 2016-11-15 – 2016-11-17 (×4): 1 [drp] via OPHTHALMIC
  Filled 2016-11-14 (×2): qty 2.5

## 2016-11-14 MED ORDER — ACETAMINOPHEN 325 MG PO TABS
650.0000 mg | ORAL_TABLET | Freq: Four times a day (QID) | ORAL | Status: DC | PRN
  Administered 2016-11-16: 650 mg via ORAL
  Filled 2016-11-14: qty 2

## 2016-11-14 MED ORDER — HEPARIN (PORCINE) IN NACL 100-0.45 UNIT/ML-% IJ SOLN
1400.0000 [IU]/h | INTRAMUSCULAR | Status: AC
  Administered 2016-11-14 – 2016-11-16 (×3): 1400 [IU]/h via INTRAVENOUS
  Filled 2016-11-14 (×3): qty 250

## 2016-11-14 MED ORDER — VITAMIN B-12 1000 MCG PO TABS
1000.0000 ug | ORAL_TABLET | Freq: Every day | ORAL | Status: DC
  Administered 2016-11-15 – 2016-11-17 (×3): 1000 ug via ORAL
  Filled 2016-11-14 (×3): qty 1

## 2016-11-14 MED ORDER — ONDANSETRON HCL 4 MG PO TABS: 4.0000 mg | ORAL_TABLET | Freq: Four times a day (QID) | ORAL | Status: DC | PRN

## 2016-11-14 MED ORDER — IOPAMIDOL (ISOVUE-370) INJECTION 76%
INTRAVENOUS | Status: AC
  Administered 2016-11-14: 100 mL
  Filled 2016-11-14: qty 100

## 2016-11-14 MED ORDER — IPRATROPIUM-ALBUTEROL 0.5-2.5 (3) MG/3ML IN SOLN
3.0000 mL | Freq: Once | RESPIRATORY_TRACT | Status: AC
  Administered 2016-11-14: 3 mL via RESPIRATORY_TRACT
  Filled 2016-11-14: qty 3

## 2016-11-14 MED ORDER — VANCOMYCIN HCL IN DEXTROSE 750-5 MG/150ML-% IV SOLN
750.0000 mg | Freq: Two times a day (BID) | INTRAVENOUS | Status: DC
  Administered 2016-11-15: 750 mg via INTRAVENOUS
  Filled 2016-11-14: qty 150

## 2016-11-14 MED ORDER — SODIUM CHLORIDE 0.9 % IV SOLN
INTRAVENOUS | Status: DC
  Administered 2016-11-15: 03:00:00 via INTRAVENOUS

## 2016-11-14 MED ORDER — BOOST / RESOURCE BREEZE PO LIQD
1.0000 | Freq: Two times a day (BID) | ORAL | Status: DC
  Administered 2016-11-15 – 2016-11-18 (×4): 1 via ORAL

## 2016-11-14 MED ORDER — SODIUM CHLORIDE 0.9 % IV BOLUS (SEPSIS)
1000.0000 mL | Freq: Once | INTRAVENOUS | Status: AC
  Administered 2016-11-14: 1000 mL via INTRAVENOUS

## 2016-11-14 MED ORDER — ENOXAPARIN SODIUM 40 MG/0.4ML ~~LOC~~ SOLN: 40.0000 mg | SUBCUTANEOUS | Status: DC

## 2016-11-14 MED ORDER — HEPARIN BOLUS VIA INFUSION
6000.0000 [IU] | Freq: Once | INTRAVENOUS | Status: AC
  Administered 2016-11-14: 6000 [IU] via INTRAVENOUS
  Filled 2016-11-14: qty 6000

## 2016-11-14 MED ORDER — ASPIRIN EC 81 MG PO TBEC
81.0000 mg | DELAYED_RELEASE_TABLET | Freq: Every day | ORAL | Status: DC
  Administered 2016-11-15 – 2016-11-18 (×4): 81 mg via ORAL
  Filled 2016-11-14 (×4): qty 1

## 2016-11-14 MED ORDER — AMIODARONE HCL 200 MG PO TABS
200.0000 mg | ORAL_TABLET | Freq: Every day | ORAL | Status: DC
  Administered 2016-11-15 – 2016-11-18 (×4): 200 mg via ORAL
  Filled 2016-11-14 (×4): qty 1

## 2016-11-14 MED ORDER — DEXTROSE 5 % IV SOLN
1.0000 g | Freq: Three times a day (TID) | INTRAVENOUS | Status: DC
  Administered 2016-11-15 – 2016-11-17 (×7): 1 g via INTRAVENOUS
  Filled 2016-11-14 (×8): qty 1

## 2016-11-14 MED ORDER — FINASTERIDE 5 MG PO TABS
5.0000 mg | ORAL_TABLET | Freq: Every day | ORAL | Status: DC
Start: 2016-11-15 — End: 2016-11-18
  Administered 2016-11-15 – 2016-11-18 (×4): 5 mg via ORAL
  Filled 2016-11-14 (×4): qty 1

## 2016-11-14 MED ORDER — VANCOMYCIN HCL 10 G IV SOLR
2000.0000 mg | Freq: Once | INTRAVENOUS | Status: AC
  Administered 2016-11-14: 2000 mg via INTRAVENOUS
  Filled 2016-11-14: qty 2000

## 2016-11-14 MED ORDER — GUAIFENESIN-DM 100-10 MG/5ML PO SYRP
20.0000 mL | ORAL_SOLUTION | ORAL | Status: DC | PRN
  Administered 2016-11-16: 20 mL via ORAL
  Filled 2016-11-14: qty 20

## 2016-11-14 NOTE — Telephone Encounter (Signed)
appts for tomorrow cancelled . [facility]( [facility] staff and daughter notified. March appt  confirmed  with ([facility]) his daughter Lelon Frohlich.

## 2016-11-14 NOTE — Progress Notes (Signed)
ANTICOAGULATION CONSULT NOTE - Initial Consult  Pharmacy Consult for Heparin  Indication: pulmonary embolus  No Known Allergies  Patient Measurements: Weight: 213 lb 1.6 oz (96.7 kg)  Vital Signs: Temp: 100.4 F (38 C) (02/13 2200) Temp Source: Oral (02/13 2200) BP: 121/59 (02/13 2200) Pulse Rate: 96 (02/13 2200)  Labs:  Recent Labs  11/14/16 1801  HGB 11.1*  HCT 32.9*  PLT 190  CREATININE 1.21    Estimated Creatinine Clearance: 58.5 mL/min (by C-G formula based on SCr of 1.21 mg/dL).   Medical History: Past Medical History:  Diagnosis Date  . Atrial fibrillation ([place])   . CHF (congestive heart failure) ([place])   . Encounter for antineoplastic chemotherapy 01/05/2016  . Encounter for antineoplastic immunotherapy 07/12/2016  . GERD (gastroesophageal reflux disease)   . Hypercholesteremia   . Hypertension   . Non-small cell lung cancer (NSCLC) ([place]) 11/19/15  . Pneumonia 11/18/2015    Assessment: 81 y/o M with lung CA from nursing facility with hypoxia, found to have PE on CT Angio, starting heparin, Hgb 11.1, Plts 190, renal function ok, PTA meds reviewed.   Goal of Therapy:  Heparin level 0.3-0.7 units/ml Monitor platelets by anticoagulation protocol: Yes   Plan:  Heparin 6000 units BOLUS Start heparin drip at 1400 units/hr 0800 HL Daily CBC/HL Monitor for bleeding   Narda Bonds 11/14/2016,11:30 PM

## 2016-11-14 NOTE — H&P (Signed)
History and Physical    Matthew Berger ATF:[id number] DOB: November 17, 1935 DOA: 11/14/2016  PCP: [name], MD  Patient coming from: Home.  Chief Complaint: Shortness of breath and chest pain.  HPI: Matthew Berger is a 81 y.o. male with history of metastatic non-small cell lung cancer on immunotherapy was admitted last week for pneumonia with influenza and discharged to rehabilitation presents to the ER with complaints of chest pain and shortness of breath. Patient was discharged from rehabilitation yesterday following which patient became more short of breath and started developing pleuritic type of chest pain. In the ER patient was found to be febrile with chest x-ray showing persistent infiltrates concerning for pneumonia. Patient was started on antibiotics empirically for pneumonia and admitted for further workup. Patient denies any hemoptysis. Otherwise denies any nausea vomiting or diarrhea.  ED Course: Chest x-ray shows infiltrates concerning for pneumonia. EKG shows normal sinus rhythm.  Review of Systems: As per HPI, rest all negative.   Past Medical History:  Diagnosis Date  . Atrial fibrillation ([place])   . CHF (congestive heart failure) ([place])   . Encounter for antineoplastic chemotherapy 01/05/2016  . Encounter for antineoplastic immunotherapy 07/12/2016  . GERD (gastroesophageal reflux disease)   . Hypercholesteremia   . Hypertension   . Non-small cell lung cancer (NSCLC) ([place]) 11/19/15  . Pneumonia 11/18/2015    Past Surgical History:  Procedure Laterality Date  . CATARACT EXTRACTION Bilateral   . ROTATOR CUFF REPAIR Right   . VIDEO BRONCHOSCOPY Bilateral 11/19/2015   Procedure: VIDEO BRONCHOSCOPY WITH FLUORO;  Surgeon: Rigoberto Noel, MD;  Location: [facility];  Service: Cardiopulmonary;  Laterality: Bilateral;  . WISDOM TOOTH EXTRACTION       reports that he quit smoking about 26 years ago. His smoking use included Cigarettes. He has a 80.00 pack-year smoking  history. He has never used smokeless tobacco. He reports that he does not drink alcohol or use drugs.  No Known Allergies  Family History  Problem Relation Age of Onset  . Breast cancer Sister   . Stroke Sister   . Colon cancer Brother   . Cancer Maternal Aunt     x2  . Hypertension Mother   . Stroke Mother   . Breast cancer Cousin   . Diabetes Other   . Hypertension Other   . Hyperlipidemia Other     Prior to Admission medications   Medication Sig Start Date End Date Taking? Authorizing Provider  amiodarone (PACERONE) 200 MG tablet Take 200 mg by mouth daily. Please call cardiology Dr. Einar Gip for refills   Yes Historical Provider, MD  aspirin 81 MG tablet Take 81 mg by mouth daily.   Yes Historical Provider, MD  bisacodyl (DULCOLAX) 10 MG suppository Place 10 mg rectally as needed for moderate constipation.   Yes Historical Provider, MD  Brinzolamide-Brimonidine [facility]) 1-0.2 % SUSP Apply 2 drops to eye 2 (two) times daily.   Yes Historical Provider, MD  diltiazem (CARDIZEM CD) 180 MG 24 hr capsule Take 1 capsule (180 mg total) by mouth 2 (two) times daily. 07/06/16  Yes Florencia Reasons, MD  dorzolamide (TRUSOPT) 2 % ophthalmic solution Place 1 drop into the left eye 2 (two) times daily.   Yes Historical Provider, MD  feeding supplement (BOOST / RESOURCE BREEZE) LIQD Take 1 Container by mouth 2 (two) times daily between meals.   Yes Historical Provider, MD  finasteride (PROSCAR) 5 MG tablet Take 5 mg by mouth daily. 10/09/16  Yes Historical Provider, MD  folic acid (FOLVITE) 1 MG tablet TAKE 1 TABLET (1 MG TOTAL) BY MOUTH DAILY IN EVENINGS 03/10/16  Yes Historical Provider, MD  guaiFENesin-dextromethorphan (ROBITUSSIN DM) 100-10 MG/5ML syrup Take 20 mLs by mouth every 4 (four) hours as needed for cough.   Yes Historical Provider, MD  ipratropium-albuterol (DUONEB) 0.5-2.5 (3) MG/3ML SOLN Take 3 mLs by nebulization every 6 (six) hours as needed. 11/08/16  Yes Ankit Chirag Amin, MD  latanoprost  (XALATAN) 0.005 % ophthalmic solution Place 1 drop into both eyes at bedtime.   Yes Historical Provider, MD  magnesium hydroxide (MILK OF MAGNESIA) 400 MG/5ML suspension Take 30 mLs by mouth daily as needed for mild constipation.   Yes Historical Provider, MD  metoprolol (LOPRESSOR) 50 MG tablet Take 1 tablet (50 mg total) by mouth 2 (two) times daily. 07/06/16  Yes Florencia Reasons, MD  omeprazole (PRILOSEC) 40 MG capsule Take 40 mg by mouth daily.   Yes Historical Provider, MD  ondansetron (ZOFRAN) 4 MG tablet Take 4 mg by mouth every 8 (eight) hours as needed for nausea or vomiting.   Yes Historical Provider, MD  oseltamivir (TAMIFLU) 75 MG capsule Take 75 mg by mouth at bedtime.  11/08/16 11/22/16 Yes Historical Provider, MD  prochlorperazine (COMPAZINE) 10 MG tablet TAKE 1 TABLET BY MOUTH EVERY 6 HOURS AS NEEDED FOR NAUSEA AND VOMITING 04/13/16  Yes Curt Bears, MD  Sodium Phosphates (RA SALINE ENEMA) 19-7 GM/118ML ENEM Place 1 application rectally once.   Yes Historical Provider, MD  tamsulosin (FLOMAX) 0.4 MG CAPS capsule Take 0.4 mg by mouth daily after supper.    Yes Historical Provider, MD  vitamin B-12 (CYANOCOBALAMIN) 1000 MCG tablet Take 1,000 mcg by mouth daily.   Yes Historical Provider, MD    Physical Exam: Vitals:   11/14/16 2000 11/14/16 2030 11/14/16 2100 11/14/16 2130  BP: 112/57 115/60 122/64 124/72  Pulse: 79 82 81 84  Resp: '23 20 18   '$ Temp:      TempSrc:      SpO2: 95% 95% 94% 91%      Constitutional: Moderately built and nourished. Vitals:   11/14/16 2000 11/14/16 2030 11/14/16 2100 11/14/16 2130  BP: 112/57 115/60 122/64 124/72  Pulse: 79 82 81 84  Resp: '23 20 18   '$ Temp:      TempSrc:      SpO2: 95% 95% 94% 91%   Eyes: Anicteric. No pallor. ENMT: No discharge from the ears eyes nose and mouth. Neck: No mass felt. No neck rigidity. Respiratory: No rhonchi or crepitations. Cardiovascular: S1-S2 no murmurs appreciated. Abdomen: Soft nontender bowel sounds present.    Musculoskeletal: No edema. No joint effusion. Skin: No rash. Skin is warm. Neurologic: Alert awake oriented to time place and person. Moving all extremities. Psychiatric: Appears normal. Normal affect.   Labs on Admission: I have personally reviewed following labs and imaging studies  CBC:  Recent Labs Lab 11/08/16 11/08/16 0505 11/14/16 1801  WBC 3.8 3.8* 11.3*  HGB  --  10.8* 11.1*  HCT  --  31.8* 32.9*  MCV  --  85.3 87.3  PLT  --  126* 250   Basic Metabolic Panel:  Recent Labs Lab 11/08/16 11/08/16 0505 11/14/16 1801  NA 138 138 139  K  --  3.3* 3.2*  CL  --  108 106  CO2  --  23 23  GLUCOSE  --  105* 138*  BUN '16 16 13  '$ CREATININE 1.0 1.01 1.21  CALCIUM  --  8.7* 8.7*  MG  --  2.0  --    GFR: Estimated Creatinine Clearance: 58.5 mL/min (by C-G formula based on SCr of 1.21 mg/dL). Liver Function Tests:  Recent Labs Lab 11/14/16 1801  AST 20  ALT 19  ALKPHOS 49  BILITOT 0.9  PROT 6.1*  ALBUMIN 2.6*   No results for input(s): LIPASE, AMYLASE in the last 168 hours. No results for input(s): AMMONIA in the last 168 hours. Coagulation Profile: No results for input(s): INR, PROTIME in the last 168 hours. Cardiac Enzymes: No results for input(s): CKTOTAL, CKMB, CKMBINDEX, TROPONINI in the last 168 hours. BNP (last 3 results) No results for input(s): PROBNP in the last 8760 hours. HbA1C: No results for input(s): HGBA1C in the last 72 hours. CBG: No results for input(s): GLUCAP in the last 168 hours. Lipid Profile: No results for input(s): CHOL, HDL, LDLCALC, TRIG, CHOLHDL, LDLDIRECT in the last 72 hours. Thyroid Function Tests: No results for input(s): TSH, T4TOTAL, FREET4, T3FREE, THYROIDAB in the last 72 hours. Anemia Panel: No results for input(s): VITAMINB12, FOLATE, FERRITIN, TIBC, IRON, RETICCTPCT in the last 72 hours. Urine analysis:    Component Value Date/Time   COLORURINE YELLOW 11/04/2016 1558   APPEARANCEUR CLEAR 11/04/2016 1558    LABSPEC 1.012 11/04/2016 1558   PHURINE 5.0 11/04/2016 1558   GLUCOSEU NEGATIVE 11/04/2016 1558   HGBUR MODERATE (A) 11/04/2016 1558   BILIRUBINUR NEGATIVE 11/04/2016 1558   KETONESUR 5 (A) 11/04/2016 1558   PROTEINUR 100 (A) 11/04/2016 1558   UROBILINOGEN 1.0 08/25/2010 1351   NITRITE NEGATIVE 11/04/2016 1558   LEUKOCYTESUR NEGATIVE 11/04/2016 1558   Sepsis Labs: '@LABRCNTIP'$ (procalcitonin:4,lacticidven:4) ) Recent Results (from the past 240 hour(s))  Culture, sputum-assessment     Status: None   Collection Time: 11/05/16 10:20 PM  Result Value Ref Range Status   Specimen Description SPUTUM  Final   Special Requests Immunocompromised  Final   Sputum evaluation THIS SPECIMEN IS ACCEPTABLE FOR SPUTUM CULTURE  Final   Report Status 11/05/2016 FINAL  Final  Culture, respiratory (NON-Expectorated)     Status: None   Collection Time: 11/05/16 10:20 PM  Result Value Ref Range Status   Specimen Description SPUTUM  Final   Special Requests Immunocompromised Reflexed from [id number]  Final   Gram Stain   Final    MODERATE WBC PRESENT, PREDOMINANTLY PMN ABUNDANT GRAM POSITIVE COCCI IN PAIRS FEW GRAM POSITIVE RODS FEW YEAST    Culture   Final    Consistent with normal respiratory flora. Performed at [facility], [place] [address], [place], [place] [address]    Report Status 11/08/2016 FINAL  Final     Radiological Exams on Admission: Dg Chest Port 1 View  Result Date: 11/14/2016 CLINICAL DATA:  Acute on chronic dyspnea EXAM: PORTABLE CHEST 1 VIEW COMPARISON:  11/04/2016 CT, CXR 11/01/2016 FINDINGS: Heart is top-normal in size. There is aortic atherosclerosis without aneurysm. Pneumonic consolidations involving the left mid and lower lung and to a lesser degree right upper and lower lobes are identified consistent multifocal pneumonia, slightly worsened at the left lung base since previous exam. High-riding left humeral head may reflect chronic rotator cuff tear with narrowing the  impingement space. No acute nor suspicious osseous abnormalities. IMPRESSION: Multifocal airspace opacities suspicious for pneumonia and/or atelectasis, slightly worsened at the left lung base since prior radiograph. Electronically Signed   By: Ashley Royalty M.D.   On: 11/14/2016 18:27    EKG: Independently reviewed. Normal sinus rhythm.  Assessment/Plan Principal Problem:   Acute  respiratory failure (HCC) Active Problems:   HTN (hypertension)   Non-small cell lung cancer (NSCLC) (HCC)   Multifocal atrial tachycardia (HCC)   Pneumonia    1. Acute respiratory failure with hypoxia and pleuritic chest pain - since chest x-ray shows pneumonia will place patient on antibiotics.. Patient has pleuritic type of chest pain for which I have ordered a CT angiogram of the chest to rule out PE. If patient turns into having PE will start patient on heparin and check 2-D echo and cycle cardiac markers.  2.  History of multifocal tachycardia on Cardizem and amiodarone and metoprolol. 3. Metastatic non-small cell lung cancer - immunotherapy on hold secondary to recent infection. 4. COPD on nebulizer. 5. BPH - continue finasteride. 6. Chronic anemia and thrombocytopenia probably related to cancer.    DVT prophylaxis - heparin. Code Status: DO NOT INTUBATE.  Family Communication: Patient's son.  Disposition Plan: Home.  Consults called: None.  Admission status: Inpatient.    Rise Patience MD [facility] Pager [phone].  If 7PM-7AM, please contact night-coverage [email or web address] Password [facility]  11/14/2016, 10:21 PM

## 2016-11-14 NOTE — ED Triage Notes (Signed)
Per EMS - pt from [facility]. Pt son went to visit pt, found to be minimally responsive. Pt is supposed to wear 3L [place] all the time; however, pt was not on O2 when son arrived. Pt unresponsive upon EMS arrival, SpO2 83% on RA, initial SBP 84. Placed on 3L [place], SpO2 89%. Placed on 15L [place], SpO2 improved to 96%. Hx lung cancer, currently being treated. Pt reported chest discomfort w/ EMS, denies at this time. Last BP w/ EMS 116/64.  Upon arrival to ED, pt SpO2 84% on 4L . Placed on 15L NRB, SpO2 97%.

## 2016-11-14 NOTE — ED Provider Notes (Signed)
[facility] DEPT Provider Note   CSN: [id number] Arrival date & time: 11/14/16  1745     History   Chief Complaint No chief complaint on file.   HPI Matthew Berger is a 81 y.o. male.  HPI  81 y.o. male with a hx of Stage IV Lung Cancer with Metastasis, GERD, HTN, presents to the Emergency Department today complaining of hypoxia via EMS from home. Seen recently in ED on 11-01-16 and admitted due to Hypoxia. On admission he was started on broad-spectrum ABX and changed to Linezolid and Meropenem based on ID consult. Diagnosed with Flu as well and started on Tamiflu. CT Chest completed which showed opacities in both upper and lower lobes with possible Pneumonia. Improved with IV antibiotics and fever improved during stay. Stopped ABX after 3 days. Noted decrease in O2 saturations with ambulation to mid 80s on RA. Noted 2L Home O2 use at night. Pt was discharged on 11-08-16 to SNF. On chart review, pt was discharged from SNF today with follow up to PCP. Currently pt without symptoms. No CP/ABD pain. Notes shortness of breath. No N/V. No diaphoresis. No pain currently. Pt states that he is normally on 3L [place] at home and has not been on it for several hours, which EMS confirmed on report. No other symptoms noted.      Past Medical History:  Diagnosis Date  . Atrial fibrillation ([place])   . CHF (congestive heart failure) ([place])   . Encounter for antineoplastic chemotherapy 01/05/2016  . Encounter for antineoplastic immunotherapy 07/12/2016  . GERD (gastroesophageal reflux disease)   . Hypercholesteremia   . Hypertension   . Non-small cell lung cancer (NSCLC) ([place]) 11/19/15  . Pneumonia 11/18/2015    Patient Active Problem List   Diagnosis Date Noted  . Exposure to the flu 11/14/2016  . Acute on chronic diastolic CHF (congestive heart failure) ([place]) 11/09/2016  . Hypokalemia 11/09/2016  . Hypomagnesemia 11/09/2016  . Multifocal atrial tachycardia ([place]) 11/09/2016  . Influenza B 11/06/2016    . Protein-calorie malnutrition, severe 11/02/2016  . Hypoxia 11/01/2016  . HCAP (healthcare-associated pneumonia) 11/01/2016  . Acute respiratory failure ([place]) 11/01/2016  . Encounter for antineoplastic immunotherapy 07/12/2016  . SIRS (systemic inflammatory response syndrome) ([place]) 06/28/2016  . Dehydration 06/28/2016  . Atrial fibrillation ([place]) 06/28/2016  . Hyperglycemia 06/28/2016  . Dyspnea 02/04/2016  . Encounter for antineoplastic chemotherapy 01/05/2016  . GERD (gastroesophageal reflux disease) 12/02/2015  . Atrial fibrillation with RVR ([place]) 12/02/2015  . Non-small cell lung cancer (NSCLC) ([place]) 11/19/2015  . Pulmonary nodules/lesions, multiple   . Influenza and pneumonia 11/18/2015  . HTN (hypertension) 11/17/2015  . Dyslipidemia 11/17/2015  . BPH (benign prostatic hyperplasia) 11/17/2015    Past Surgical History:  Procedure Laterality Date  . CATARACT EXTRACTION Bilateral   . ROTATOR CUFF REPAIR Right   . VIDEO BRONCHOSCOPY Bilateral 11/19/2015   Procedure: VIDEO BRONCHOSCOPY WITH FLUORO;  Surgeon: Rigoberto Noel, MD;  Location: [facility];  Service: Cardiopulmonary;  Laterality: Bilateral;  . WISDOM TOOTH EXTRACTION         Home Medications    Prior to Admission medications   Medication Sig Start Date End Date Taking? Authorizing Provider  amiodarone (PACERONE) 200 MG tablet Take 200 mg by mouth daily. Please call cardiology Dr. Einar Gip for refills    Historical Provider, MD  aspirin 81 MG tablet Take 81 mg by mouth daily.    Historical Provider, MD  Brinzolamide-Brimonidine [facility]) 1-0.2 % SUSP Apply 2 drops to eye 2 (  two) times daily.    Historical Provider, MD  diltiazem (CARDIZEM CD) 180 MG 24 hr capsule Take 1 capsule (180 mg total) by mouth 2 (two) times daily. 07/06/16   Florencia Reasons, MD  dorzolamide (TRUSOPT) 2 % ophthalmic solution Place 1 drop into the left eye 2 (two) times daily.    Historical Provider, MD  finasteride (PROSCAR) 5 MG tablet Take 5 mg  by mouth daily. 10/09/16   Historical Provider, MD  folic acid (FOLVITE) 1 MG tablet TAKE 1 TABLET (1 MG TOTAL) BY MOUTH DAILY. 03/10/16   Historical Provider, MD  guaiFENesin-dextromethorphan (ROBITUSSIN DM) 100-10 MG/5ML syrup Take 20 mLs by mouth every 4 (four) hours as needed for cough.    Historical Provider, MD  ipratropium-albuterol (DUONEB) 0.5-2.5 (3) MG/3ML SOLN Take 3 mLs by nebulization every 6 (six) hours as needed. 11/08/16   Ankit Arsenio Loader, MD  latanoprost (XALATAN) 0.005 % ophthalmic solution Place 1 drop into both eyes at bedtime.    Historical Provider, MD  metoprolol (LOPRESSOR) 50 MG tablet Take 1 tablet (50 mg total) by mouth 2 (two) times daily. 07/06/16   Florencia Reasons, MD  oseltamivir (TAMIFLU) 75 MG capsule Take 75 mg by mouth daily. 11/08/16 11/22/16  Historical Provider, MD  prochlorperazine (COMPAZINE) 10 MG tablet TAKE 1 TABLET BY MOUTH EVERY 6 HOURS AS NEEDED FOR NAUSEA AND VOMITING 04/13/16   Curt Bears, MD  tamsulosin (FLOMAX) 0.4 MG CAPS capsule Take 0.4 mg by mouth daily.    Historical Provider, MD  vitamin B-12 (CYANOCOBALAMIN) 1000 MCG tablet Take 1,000 mcg by mouth daily.    Historical Provider, MD    Family History Family History  Problem Relation Age of Onset  . Breast cancer Sister   . Stroke Sister   . Colon cancer Brother   . Cancer Maternal Aunt     x2  . Hypertension Mother   . Stroke Mother   . Breast cancer Cousin   . Diabetes Other   . Hypertension Other   . Hyperlipidemia Other     Social History Social History  Substance Use Topics  . Smoking status: Former Smoker    Packs/day: 2.00    Years: 40.00    Types: Cigarettes    Quit date: 10/02/1990  . Smokeless tobacco: Never Used     Comment: smoked 1-2 ppd.   . Alcohol use No     Allergies   Patient has no known allergies.   Review of Systems Review of Systems ROS reviewed and all are negative for acute change except as noted in the HPI.  Physical Exam Updated Vital Signs BP  113/56   Pulse 75   Temp (S) 100.1 F (37.8 C) (Axillary)   Resp 26   SpO2 95%   Physical Exam  Constitutional: He is oriented to person, place, and time. Vital signs are normal. He appears well-developed and well-nourished. No distress.  HENT:  Head: Normocephalic and atraumatic.  Right Ear: Hearing, tympanic membrane, external ear and ear canal normal.  Left Ear: Hearing, tympanic membrane, external ear and ear canal normal.  Nose: Nose normal.  Mouth/Throat: Uvula is midline, oropharynx is clear and moist and mucous membranes are normal. No trismus in the jaw. No oropharyngeal exudate, posterior oropharyngeal erythema or tonsillar abscesses.  Eyes: Conjunctivae and EOM are normal. Pupils are equal, round, and reactive to light.  Neck: Normal range of motion. Neck supple. No tracheal deviation present.  Cardiovascular: Normal rate, regular rhythm, S1 normal, S2 normal, normal  heart sounds, intact distal pulses and normal pulses.   Pulmonary/Chest: Effort normal. No respiratory distress. He has no decreased breath sounds. He has wheezes in the right upper field, the right lower field, the left upper field and the left lower field. He has no rhonchi. He has no rales.  Presents with Nonrebreather 15L   Abdominal: Normal appearance and bowel sounds are normal. There is no tenderness.  Musculoskeletal: Normal range of motion.  Neurological: He is alert and oriented to person, place, and time.  Skin: Skin is warm and dry.  Psychiatric: He has a normal mood and affect. His speech is normal and behavior is normal. Thought content normal.  Nursing note and vitals reviewed.  ED Treatments / Results  Labs (all labs ordered are listed, but only abnormal results are displayed) Labs Reviewed  CBC - Abnormal; Notable for the following:       Result Value   WBC 11.3 (*)    RBC 3.77 (*)    Hemoglobin 11.1 (*)    HCT 32.9 (*)    All other components within normal limits  COMPREHENSIVE METABOLIC  PANEL - Abnormal; Notable for the following:    Potassium 3.2 (*)    Glucose, Bld 138 (*)    Calcium 8.7 (*)    Total Protein 6.1 (*)    Albumin 2.6 (*)    GFR calc non Af Amer 56 (*)    All other components within normal limits  CULTURE, BLOOD (ROUTINE X 2)  CULTURE, BLOOD (ROUTINE X 2)  URINALYSIS, ROUTINE W REFLEX MICROSCOPIC  I-STAT CG4 LACTIC ACID, ED  Randolm Idol, ED   EKG  EKG Interpretation None       Radiology Dg Chest Port 1 View  Result Date: 11/14/2016 CLINICAL DATA:  Acute on chronic dyspnea EXAM: PORTABLE CHEST 1 VIEW COMPARISON:  11/04/2016 CT, CXR 11/01/2016 FINDINGS: Heart is top-normal in size. There is aortic atherosclerosis without aneurysm. Pneumonic consolidations involving the left mid and lower lung and to a lesser degree right upper and lower lobes are identified consistent multifocal pneumonia, slightly worsened at the left lung base since previous exam. High-riding left humeral head may reflect chronic rotator cuff tear with narrowing the impingement space. No acute nor suspicious osseous abnormalities. IMPRESSION: Multifocal airspace opacities suspicious for pneumonia and/or atelectasis, slightly worsened at the left lung base since prior radiograph. Electronically Signed   By: Ashley Royalty M.D.   On: 11/14/2016 18:27    Procedures Procedures (including critical care time)  Medications Ordered in ED Medications  vancomycin (VANCOCIN) 2,000 mg in sodium chloride 0.9 % 500 mL IVPB (2,000 mg Intravenous New Bag/Given 11/14/16 1839)  ceFEPIme (MAXIPIME) 2 g in dextrose 5 % 50 mL IVPB (2 g Intravenous New Bag/Given 11/14/16 1839)  vancomycin (VANCOCIN) IVPB 750 mg/150 ml premix (not administered)  ceFEPIme (MAXIPIME) 2 g in dextrose 5 % 50 mL IVPB (not administered)  ipratropium-albuterol (DUONEB) 0.5-2.5 (3) MG/3ML nebulizer solution 3 mL (3 mLs Nebulization Given by Other 11/14/16 1819)  sodium chloride 0.9 % bolus 1,000 mL (1,000 mLs Intravenous New  Bag/Given 11/14/16 1821)   Initial Impression / Assessment and Plan / ED Course  I have reviewed the triage vital signs and the nursing notes.  Pertinent labs & imaging results that were available during my care of the patient were reviewed by me and considered in my medical decision making (see chart for details).  Final Clinical Impressions(s) / ED Diagnoses  {I have reviewed and evaluated the relevant  laboratory values. {I have reviewed and evaluated the relevant imaging studies. {I have interpreted the relevant EKG. {I have reviewed the relevant previous healthcare records. {I have reviewed EMS Documentation. {I obtained HPI from historian. {Patient discussed with supervising physician.  ED Course:  Assessment: 80yM with a hx of Stage IV Lung Cancer with Metastasis, GERD, HTN, presents to the Emergency Department today complaining of hypoxia. Seen recently in ED on 11-01-16 and admitted due to Hypoxia. On admission he was started on broad-spectrum ABX and changed to Linezolid and Meropenem based on ID consult. Diagnosed with Flu as well and started on Tamiflu. CT Chest completed which showed opacities in both upper and lower lobes with possible Pneumonia. Improved with IV antibiotics and fever improved during stay. Stopped ABX after 3 days. Noted decrease in O2 saturations with ambulation to mid 80s on RA. Noted 2L Home O2 use at night. Pt was discharged on 11-08-16 to SNF. On chart review, pt was discharged from SNF today with follow up to PCP. On exam, pt in NAD. Nontoxic/nonseptic appearing. VS without tachycardia. Hypotensive on arrival with BP 83 systolic. Hypoxia 84%.. Temp 100.77F. Lungs with bilateral wheeze. Heart RRR. Noted beta blocker usage could mask tachycardia. Abdomen nontender soft. Given duoneb as well as fluids in ED. Code spesis initiated due to hypotension, hypoxia  With suspect pulmonary source. Given Vanc/Cefpime for HCAP. CXR Portable confirms PNA on left base that has slightly  worsened. CBC with WBC 11.3. CMP unremarkable. iStat Lactic 1.86. Trop negative. EKG unremarkable. Discussed with supervising physician. Plan is to Admit to medicine.  Disposition/Plan:  Admit Pt acknowledges and agrees with plan  Supervising Physician Carmin Muskrat, MD  Final diagnoses:  Hypotension, unspecified hypotension type  Hypoxia  HCAP (healthcare-associated pneumonia)    New Prescriptions New Prescriptions   No medications on file     Shary Decamp, PA-C 11/15/16 0040    Carmin Muskrat, MD 11/19/16 [phone]

## 2016-11-14 NOTE — ED Notes (Signed)
Attempted report x1. 

## 2016-11-14 NOTE — Progress Notes (Signed)
Location:  [facility] Room Number: 858I Place of Service:  SNF (31) Noah Delaine. Sheppard Coil, MD  PCP: [name], MD Patient Care Team: [name], MD as PCP - General (Internal Medicine)  Extended Emergency Contact Information Primary Emergency Contact: [facility] Address: [place], [place] [address] [place] Phone: [phone] Relation: Spouse Secondary Emergency Contact: Stewart,Betty  [place] Phone: [phone] Relation: Sister  No Known Allergies  Chief Complaint  Patient presents with  . Discharge Note    Discharged from SNF    HPI:  81 y.o. male  who is currently being treated for stage IV lung cancer with metastasis, history of GERD, hypertension, who presented to the ED from oncology clinic, with 48 hour history of fever, cough, productive sputum, shortness of breath and flu-like symptoms.Upon his admission he was started on broad-spectrum antibiotics which was later changed to linezolid and meropenem with recommendations of infectious disease. He was also diagnosed of FLUpositive therefore received full course treatment of Tamiflu. CT of the chest was done which showed significant progression of his pulmonary disease with numerous groundglass opacity in upper and lower lobes bilaterally suggesting possible pneumonia. Over the course of next 2 days his breathing improved he remained afebrile and his antibiotics were stopped after 3 days. He was also seen by physical therapy but did get short of breath when moving from his bed to the chair also with ambulation his pulse ox dropped to mid 80s. I spoke with the son who said patient normally uses 2 L of oxygen at home at night which he has been doing well with. Pt is admitted to SNF for OT/PT and is now ready to be d/c to home..There was an influenza A outbreak at SNF while pt was there so he was dosed with tamiflu as prophylaxis  againast influenza A.    Past Medical History:  Diagnosis Date  . Atrial fibrillation ([place])   . CHF (congestive heart failure) ([place])   . Encounter for antineoplastic chemotherapy 01/05/2016  . Encounter for antineoplastic immunotherapy 07/12/2016  . GERD (gastroesophageal reflux disease)   . Hypercholesteremia   . Hypertension   . Non-small cell lung cancer (NSCLC) ([place] [address] [place]) 11/19/15  . Pneumonia 11/18/2015    Past Surgical History:  Procedure Laterality Date  . CATARACT EXTRACTION Bilateral   . ROTATOR CUFF REPAIR Right   . VIDEO BRONCHOSCOPY Bilateral 11/19/2015   Procedure: VIDEO BRONCHOSCOPY WITH FLUORO;  Surgeon: Rigoberto Noel, MD;  Location: [facility];  Service: Cardiopulmonary;  Laterality: Bilateral;  . WISDOM TOOTH EXTRACTION       reports that he quit smoking about 26 years ago. His smoking use included Cigarettes. He has a 80.00 pack-year smoking history. He has never used smokeless tobacco. He reports that he does not drink alcohol or use drugs. Social History   Social History  . Marital status: Married    Spouse name: N/A  . Number of children: Y8  . Years of education: N/A   Occupational History  . retired Administrator    Social History Main Topics  . Smoking status: Former Smoker    Packs/day: 2.00    Years: 40.00    Types: Cigarettes    Quit date: 10/02/1990  . Smokeless tobacco: Never Used     Comment: smoked 1-2 ppd.   . Alcohol use No  . Drug use: No  . Sexual activity: Not  Currently   Other Topics Concern  . Not on file   Social History Narrative   Originally from [place]. Always lived in [place]. Previously has traveled to [place], [place], [place], [place], [place], [place], [place], & [place]. Previously worked driving garbage truck. No pets currently. No known mold exposure.     Pertinent  Health Maintenance Due  Topic Date Due  . PNA vac Low Risk Adult (1 of 2 - PCV13) 09/28/2003  . INFLUENZA VACCINE  Completed    Medications: Allergies as of 11/14/2016   No Known Allergies       Medication List       Accurate as of 11/14/16  4:30 PM. Always use your most recent med list.          amiodarone 200 MG tablet Commonly known as:  PACERONE Take 200 mg by mouth daily. Please call cardiology Dr. Einar Gip for refills   aspirin 81 MG tablet Take 81 mg by mouth daily.   diltiazem 180 MG 24 hr capsule Commonly known as:  CARDIZEM CD Take 1 capsule (180 mg total) by mouth 2 (two) times daily.   dorzolamide 2 % ophthalmic solution Commonly known as:  TRUSOPT Place 1 drop into the left eye 2 (two) times daily.   finasteride 5 MG tablet Commonly known as:  PROSCAR Take 5 mg by mouth daily.   FLOMAX 0.4 MG Caps capsule Generic drug:  tamsulosin Take 0.4 mg by mouth daily.   folic acid 1 MG tablet Commonly known as:  FOLVITE TAKE 1 TABLET (1 MG TOTAL) BY MOUTH DAILY.   guaiFENesin-dextromethorphan 100-10 MG/5ML syrup Commonly known as:  ROBITUSSIN DM Take 20 mLs by mouth every 4 (four) hours as needed for cough.   ipratropium-albuterol 0.5-2.5 (3) MG/3ML Soln Commonly known as:  DUONEB Take 3 mLs by nebulization every 6 (six) hours as needed.   latanoprost 0.005 % ophthalmic solution Commonly known as:  XALATAN Place 1 drop into both eyes at bedtime.   metoprolol 50 MG tablet Commonly known as:  LOPRESSOR Take 1 tablet (50 mg total) by mouth 2 (two) times daily.   oseltamivir 75 MG capsule Commonly known as:  TAMIFLU Take 75 mg by mouth daily.   prochlorperazine 10 MG tablet Commonly known as:  COMPAZINE TAKE 1 TABLET BY MOUTH EVERY 6 HOURS AS NEEDED FOR NAUSEA AND VOMITING   SIMBRINZA 1-0.2 % Susp Generic drug:  Brinzolamide-Brimonidine Apply 2 drops to eye 2 (two) times daily.   vitamin B-12 1000 MCG tablet Commonly known as:  CYANOCOBALAMIN Take 1,000 mcg by mouth daily.        Vitals:   11/14/16 1336  BP: 115/73  Pulse: 78  Resp: 18  Temp: 98.3 F (36.8 C)  Weight: 212 lb 6.4 oz (96.3 kg)  Height: '6\' 2"'$  (1.88 m)   Body mass  index is 27.27 kg/m.  Physical Exam  GENERAL APPEARANCE: Alert, conversant. No acute distress.  HEENT: Unremarkable. RESPIRATORY: Breathing is even, unlabored. Lung sounds are clear   CARDIOVASCULAR: Heart RRR no murmurs, rubs or gallops. No peripheral edema.  GASTROINTESTINAL: Abdomen is soft, non-tender, not distended w/ normal bowel sounds.  NEUROLOGIC: Cranial nerves 2-12 grossly intact. Moves all extremities   Labs reviewed: Basic Metabolic Panel:  Recent Labs  06/28/16 1443  11/05/16 0542 11/06/16 0447 11/08/16 11/08/16 0505  NA  --   < > 134* 138  137 138 138  K  --   < > 3.3* 3.8  3.3*  --  3.3*  CL  --   < > 106 109  --  108  CO2  --   < > 22 21*  --  23  GLUCOSE  --   < > 136* 99  --  105*  BUN  --   < > '13 16  17 16 16  '$ CREATININE  --   < > 1.20 1.17  1.0 1.0 1.01  CALCIUM  --   < > 8.2* 8.1*  --  8.7*  MG 1.8  < > 1.8 1.9  --  2.0  PHOS 2.5  --   --   --   --   --   < > = values in this interval not displayed. No results found for: [facility] Liver Function Tests:  Recent Labs  11/03/16 0409 11/04/16 1511 11/05/16 0542  AST 37  37 76*  76* 87*  ALT 16*  '16 20  20 22  '$ ALKPHOS 40  40 46  46 42  BILITOT 0.6 0.7 0.4  PROT 5.7* 6.7 6.0*  ALBUMIN 2.7* 3.0* 2.6*    Recent Labs  06/28/16 0946  LIPASE 18   No results for input(s): AMMONIA in the last 8760 hours. CBC:  Recent Labs  11/01/16 1102  11/03/16 0409  11/04/16 0643 11/05/16 11/05/16 0542 11/08/16 11/08/16 0505  WBC 5.5  < > 3.2  3.2*  < > 2.8* 3.5 3.5* 3.8 3.8*  NEUTROABS 4.7  --  2.4  --  2.1  --   --   --   --   HGB 12.6*  < > 10.1*  10.1*  < > 10.8* 11.3* 11.3*  --  10.8*  HCT 37.1*  < > 29.9*  30*  < > 31.7* 33* 32.7*  --  31.8*  MCV 88.1  < > 88.2  --  87.8  --  84.7  --  85.3  PLT 175  < > 125*  125*  < > 118* 100* 100*  --  126*  < > = values in this interval not displayed. Lipid No results for input(s): CHOL, HDL, LDLCALC, TRIG in the last 8760 hours. Cardiac  Enzymes: No results for input(s): CKTOTAL, CKMB, CKMBINDEX, TROPONINI in the last 8760 hours. BNP:  Recent Labs  06/28/16 0946  BNP 123.7*   CBG:  Recent Labs  07/05/16 2154 07/06/16 0737 07/06/16 1148  GLUCAP 128* 95 124*    Procedures and Imaging Studies During Stay: Dg Chest 2 View  Result Date: 11/01/2016 CLINICAL DATA:  History of lung carcinoma with fever and flu like symptoms EXAM: CHEST  2 VIEW COMPARISON:  09/15/2016 FINDINGS: Cardiac shadow is within normal. Diffuse density noted throughout the left lung base similar to that seen on recent CT examination representing a component of neoplasm and consolidation. No sizable effusion is noted. The nodular changes seen in the right lung are less well appreciated on the current exam. IMPRESSION: Improved aeration in the left base although persistent density is noted similar to that seen on prior CT examination. The previously seen right-sided nodules are less well appreciated on the current study. Electronically Signed   By: Inez Catalina M.D.   On: 11/01/2016 11:20   Ct Head Wo Contrast  Result Date: 11/04/2016 CLINICAL DATA:  Altered mental status.  History of lung cancer. EXAM: CT HEAD WITHOUT CONTRAST TECHNIQUE: Contiguous axial images were obtained from the base of the skull through the vertex without intravenous contrast. COMPARISON:  Brain MRI dated 12/17/2015. FINDINGS: Brain: There is mild generalized  age related parenchymal atrophy with commensurate dilatation of the ventricles and sulci. Mild chronic small vessel ischemic change noted within the deep periventricular white matter regions bilaterally. There is no mass, hemorrhage, edema or other evidence of acute parenchymal abnormality. No extra-axial hemorrhage. Vascular: There are chronic calcified atherosclerotic changes of the large vessels at the skull base. No unexpected hyperdense vessel. Skull: Normal. Negative for fracture or focal lesion. Sinuses/Orbits: No acute  finding. Other: None. IMPRESSION: 1. No acute findings.  No intracranial mass, hemorrhage or edema. 2. Mild atrophy and chronic ischemic changes in the white matter. Electronically Signed   By: Franki Cabot M.D.   On: 11/04/2016 18:16   Ct Angio Chest Pe W Or Wo Contrast  Result Date: 11/04/2016 CLINICAL DATA:  Positive for flu. History of lung cancer. Shortness of breath with low oxygen. EXAM: CT ANGIOGRAPHY CHEST WITH CONTRAST TECHNIQUE: Multidetector CT imaging of the chest was performed using the standard protocol during bolus administration of intravenous contrast. Multiplanar CT image reconstructions and MIPs were obtained to evaluate the vascular anatomy. CONTRAST:  100 cc Isovue 370 COMPARISON:  Chest x-ray 11/04/2016, chest CT 06/29/2016 FINDINGS: Cardiovascular: Coronary artery calcifications are present. No pericardial effusion. There is atherosclerotic calcification of the thoracic aorta not associated with aneurysm. Pulmonary arteries are only moderately well opacified by contrast bolus. No evidence for acute pulmonary emboli in the main pulmonary arteries and larger branches. Smaller pulmonary emboli would not be detectable due to artifacts and poor bolus. Mediastinum/Nodes: The visualized portion of the thyroid gland has a normal appearance. Small mediastinal lymph nodes are present, stable in appearance. Soft tissue in the precarinal/subcarinal regions appear stable with discrete nodes difficult to measure. Largest is estimated to measure approximately 11 mm greatest diameter. The esophagus is normal in appearance. Small hiatal hernia. Lungs/Pleura: There are bilateral pleural effusions, left greater than right. Consolidation in the left lower lobe persists with air bronchograms. This process is contiguous with numerous nodular opacities throughout the left lower lobe and extending into the lingula /left upper lobe. Within the right lung there are more numerous ground-glass opacities, now too  numerous to count. Index lesion in the right upper lobe previously measured 1.6 cm and currently measures 2.5 cm on image 51 of series 10. Upper Abdomen: The gallbladder is present.  No acute abnormality. Musculoskeletal: Sclerotic lesion within T9 appears stable. No new lesions or evidence for acute fracture. Review of the MIP images confirms the above findings. IMPRESSION: 1. Significant progression of pulmonary disease. Numerous ground-glass nodules are now identified throughout upper and lower lobes bilaterally with significant components involving the left lower lobe, lingula/left upper lobe. 2. Bilateral pleural effusions left greater than right. 3. Quality of the exam is limited to exclusion of pulmonary emboli within the main pulmonary arteries and segmental branches. Smaller pulmonary emboli would be very difficult to exclude. 4. Coronary artery disease. 5. Stable appearance of mediastinal lymph nodes. 6. Stable appearance of sclerotic lesion in the T9 vertebral body. Electronically Signed   By: Nolon Nations M.D.   On: 11/04/2016 18:27   Dg Chest Port 1 View  Result Date: 11/04/2016 CLINICAL DATA:  Sepsis.  History of lung cancer. EXAM: PORTABLE CHEST 1 VIEW COMPARISON:  CT scan September 15, 2016 and chest x-ray November 01, 2016 FINDINGS: The patient's known pulmonary nodules are not as well appreciated on this study compared to the recent CT scan. Vague densities in the central right lung likely represent residual nodules. The opacity in the left mid  to lower lung is similar in the interval. Left retrocardiac opacity is stable. No pneumothorax or other change. IMPRESSION: Mild improvement on the right and no significant interval change on the left. Electronically Signed   By: Dorise Bullion III M.D   On: 11/04/2016 15:37    Assessment/Plan:   Influenza B  Exposure to the flu  Acute respiratory failure with hypoxia ([place])  HCAP (healthcare-associated pneumonia)  Acute on chronic diastolic  CHF (congestive heart failure) ([facility])  Hypokalemia  Hypomagnesemia  Essential hypertension  Multifocal atrial tachycardia ([facility])  Paroxysmal atrial fibrillation ([facility])  Benign prostatic hyperplasia without lower urinary tract symptoms  Non-small cell cancer of left lung ([facility])  Pneumonia of left lung due to infectious organism, unspecified part of lung   Patient is being discharged with the following home health services:  HH/OT/PT/Nursing  Patient is being discharged with the following durable medical equipment:  Standard WC  Patient has been advised to f/u with their PCP in 1-2 weeks to bring them up to date on their rehab stay.  Social services at facility was responsible for arranging this appointment.  Pt was provided with a 30 day supply of prescriptions for medications and refills must be obtained from their PCP.  For controlled substances, a more limited supply may be provided adequate until PCP appointment only.    Time spent > 30 min;> 50% of time with patient was spent reviewing records, labs, tests and studies, counseling and developing plan of care  Noah Delaine. Sheppard Coil, MD

## 2016-11-14 NOTE — Progress Notes (Signed)
Pharmacy Antibiotic Note  Matthew Berger is a 81 y.o. male admitted on 11/14/2016 with HCAP and sepsis. Pt recently admitted for hypoxia and treated with meropenem and linezolid per ID recommendations. Pharmacy has been consulted for vancomycin and cefepime dosing. SCr 1.21 on admit (eCrCl ~58 ml/min), WBC elevated, CXR shows likely pneumonia.  Plan: -Vancomycin '2000mg'$  IV x1 then '750mg'$  IV q12h -Cefepime 2g IV q8h -Monitor renal function, cultures, LOT -Obtain vancomycin level as indicated     Temp (24hrs), Avg:99.2 F (37.3 C), Min:98.3 F (36.8 C), Max:100.1 F (37.8 C)   Recent Labs Lab 11/08/16 11/08/16 0505  WBC 3.8 3.8*  CREATININE 1.0 1.01    Estimated Creatinine Clearance: 70.1 mL/min (by C-G formula based on SCr of 1.01 mg/dL).    No Known Allergies  Antimicrobials this admission: 2/13 Cefepime >>  2/13 Vancomycin >>   Dose adjustments this admission: none  Microbiology results: 2/13 BCx: IP  Thank you for allowing pharmacy to be a part of this patient's care.  Arrie Senate, PharmD PGY-1 Pharmacy Resident Pager: [phone] 11/14/2016

## 2016-11-14 NOTE — Plan of Care (Addendum)
RN paged because pt and son wanted to change his code status. Pt is alert but somewhat confused at times and NP attempted to speak just to the pt at first. NP explained CPR and when and why it was done and intubation and reason for that. Pt asked "how long" CPR is done. NP explained that normally, if we don't have a pulse after about 20 mins, we stop CPR. He became confused about CPR x 20 mins and being intubated only "20 minutes". Then, NP spoke with son. Son stated they have discussed this issue for over a year and pt wants CPR but not intubation. Order changed to reflect wishes.  KJKG, NP [facility] In addition to above. Radiologist, Dr. Gerilyn Nestle, paged this NP to discuss pt's CTA chest results. PE + in right lobar artery and right main pulmonary artery. The RV to LV ratio is elevated, however, this was also true on a past CTA chest where the pt DID NOT have a PE. Because admitting MD's H&P not finished, NP called Dr. Hal Hope with results who authorized use of Heparin drip. Heparin per pharmacy order placed. Lovenox as VTE d/c'd.  KJKG, NP [facility]

## 2016-11-15 ENCOUNTER — Ambulatory Visit: Payer: Medicare Other

## 2016-11-15 ENCOUNTER — Inpatient Hospital Stay (HOSPITAL_COMMUNITY): Payer: Medicare Other

## 2016-11-15 ENCOUNTER — Ambulatory Visit: Payer: Medicare Other | Admitting: Internal Medicine

## 2016-11-15 ENCOUNTER — Other Ambulatory Visit: Payer: Medicare Other

## 2016-11-15 DIAGNOSIS — E876 Hypokalemia: Secondary | ICD-10-CM

## 2016-11-15 DIAGNOSIS — I2699 Other pulmonary embolism without acute cor pulmonale: Secondary | ICD-10-CM

## 2016-11-15 DIAGNOSIS — I471 Supraventricular tachycardia: Secondary | ICD-10-CM

## 2016-11-15 DIAGNOSIS — C3490 Malignant neoplasm of unspecified part of unspecified bronchus or lung: Secondary | ICD-10-CM

## 2016-11-15 DIAGNOSIS — I10 Essential (primary) hypertension: Secondary | ICD-10-CM

## 2016-11-15 LAB — URINALYSIS, ROUTINE W REFLEX MICROSCOPIC
BILIRUBIN URINE: NEGATIVE
Bacteria, UA: NONE SEEN
GLUCOSE, UA: NEGATIVE mg/dL
HGB URINE DIPSTICK: NEGATIVE
KETONES UR: NEGATIVE mg/dL
NITRITE: NEGATIVE
PROTEIN: 30 mg/dL — AB
Specific Gravity, Urine: >1.046 — ABNORMAL HIGH (ref 1.005–1.030)
pH: 6 (ref 5.0–8.0)

## 2016-11-15 LAB — BASIC METABOLIC PANEL
Anion gap: 11 (ref 5–15)
BUN: 10 mg/dL (ref 6–20)
CHLORIDE: 107 mmol/L (ref 101–111)
CO2: 21 mmol/L — ABNORMAL LOW (ref 22–32)
CREATININE: 1.04 mg/dL (ref 0.61–1.24)
Calcium: 8.2 mg/dL — ABNORMAL LOW (ref 8.9–10.3)
GFR calc Af Amer: >60 mL/min (ref >60)
Glucose, Bld: 130 mg/dL — ABNORMAL HIGH (ref 65–99)
Potassium: 3 mmol/L — ABNORMAL LOW (ref 3.5–5.1)
SODIUM: 139 mmol/L (ref 135–145)

## 2016-11-15 LAB — HEPARIN LEVEL (UNFRACTIONATED)
HEPARIN UNFRACTIONATED: 0.5 [IU]/mL (ref 0.30–0.70)
Heparin Unfractionated: 0.64 IU/mL (ref 0.30–0.70)

## 2016-11-15 LAB — CBC
HEMATOCRIT: 32.1 % — AB (ref 39.0–52.0)
Hemoglobin: 10.6 g/dL — ABNORMAL LOW (ref 13.0–17.0)
MCH: 29 pg (ref 26.0–34.0)
MCHC: 33 g/dL (ref 30.0–36.0)
MCV: 87.7 fL (ref 78.0–100.0)
Platelets: 174 10*3/uL (ref 150–400)
RBC: 3.66 MIL/uL — ABNORMAL LOW (ref 4.22–5.81)
RDW: 14.6 % (ref 11.5–15.5)
WBC: 11.2 10*3/uL — AB (ref 4.0–10.5)

## 2016-11-15 LAB — STREP PNEUMONIAE URINARY ANTIGEN: Strep Pneumo Urinary Antigen: NEGATIVE

## 2016-11-15 LAB — MAGNESIUM: Magnesium: 1.7 mg/dL (ref 1.7–2.4)

## 2016-11-15 LAB — TROPONIN I: Troponin I: <0.03 ng/mL (ref <0.03)

## 2016-11-15 LAB — ECHOCARDIOGRAM COMPLETE
Height: 74 in
WEIGHTICAEL: 3409.6 [oz_av]

## 2016-11-15 LAB — HIV ANTIBODY (ROUTINE TESTING W REFLEX): HIV Screen 4th Generation wRfx: NONREACTIVE

## 2016-11-15 LAB — PROCALCITONIN: PROCALCITONIN: 0.15 ng/mL

## 2016-11-15 LAB — MRSA PCR SCREENING: MRSA by PCR: NEGATIVE

## 2016-11-15 MED ORDER — POTASSIUM CHLORIDE CRYS ER 20 MEQ PO TBCR
40.0000 meq | EXTENDED_RELEASE_TABLET | Freq: Once | ORAL | Status: AC
  Administered 2016-11-15: 40 meq via ORAL
  Filled 2016-11-15: qty 2

## 2016-11-15 MED ORDER — TRAMADOL HCL 50 MG PO TABS
50.0000 mg | ORAL_TABLET | Freq: Four times a day (QID) | ORAL | Status: DC | PRN
  Filled 2016-11-15: qty 1

## 2016-11-15 MED ORDER — VANCOMYCIN HCL 10 G IV SOLR
1250.0000 mg | Freq: Two times a day (BID) | INTRAVENOUS | Status: DC
  Administered 2016-11-15 – 2016-11-16 (×2): 1250 mg via INTRAVENOUS
  Filled 2016-11-15 (×3): qty 1250

## 2016-11-15 MED ORDER — PERFLUTREN LIPID MICROSPHERE
1.0000 mL | INTRAVENOUS | Status: AC | PRN
  Administered 2016-11-15: 1 mL via INTRAVENOUS
  Filled 2016-11-15: qty 10

## 2016-11-15 NOTE — Progress Notes (Signed)
PROGRESS NOTE  Matthew Berger  ZJQ:[id number] DOB: March 19, 1936  DOA: 11/14/2016 PCP: [name], MD   Brief Narrative:  81 year old male with a PMH of metastatic non-small cell lung cancer, adenocarcinoma currently undergoing treatment with immunotherapy, anemia of chronic disease, chronic diastolic CHF, GERD, HLD, HTN, MAT versus A. fib, recent hospitalization 11/01/16-11/08/16 for acute respiratory failure with hypoxia secondary to influenza B, healthcare associated pneumonia and acute on chronic diastolic CHF, improved and was discharged to SNF. He completed course of antibiotics at SNF. He was discharged from SNF with home health services on 11/14/16. He presented to the ED on 11/14/16 with complaints of pleuritic chest pain and dyspnea. In the ED chest x-ray showed persistent infiltrates concerning for pneumonia but subsequent CTA chest showed acute PE with right heart strain. He was started on IV heparin.   Assessment & Plan:   Principal Problem:   Acute respiratory failure (HCC) Active Problems:   HTN (hypertension)   Non-small cell lung cancer (NSCLC) (HCC)   Multifocal atrial tachycardia (HCC)   Pneumonia   1. Acute PE (involving right main, right upper lung and right lower lung pulmonary arteries) with pleuritic chest pain: Confirmed on CTA chest. Associated right heart strain by CT consistent with at least PE. Started on IV heparin infusion per pharmacy. Follow 2-D echo results. Will need to be on long-term Lovenox given history of stage IV lung cancer. Discussed with CCM> continue IV Heparin, transition and no other intervention. 2. Stage IV non-small cell lung cancer, adenocarcinoma: Based on CTA chest 11/14/16, consolidation in the left lower lung with patchy nodular ground glass infiltrates throughout both lungs consistent with history of non-small cell lung cancer and sclerotic metastasis to T9. Consider stopping all antibiotics (check pro calcitonin).? Discussed with oncology  regarding palliative care consultation for goals of care. 3. Acute respiratory failure with hypoxia: Secondary to acute PE complicating advanced lung cancer. Treat supportively with oxygen. 4. Multifocal atrial tachycardia versus A. fib: Telemetry shows sinus rhythm.? Occasional episodes of A. fib. Already on anticoagulation with IV heparin. Continue amiodarone and metoprolol. 5. COPD: Stable without clinical bronchospasm. 6. BPH: Continue finasteride. 7. Chronic diastolic CHF: Clinically compensated. 8. Essential hypertension: Controlled 9. Hyperlipidemia: 10. Hypokalemia: Replace and follow. Magnesium 1.7. 11. Anemia: Stable.   DVT prophylaxis: On IV heparin Code Status: DO NOT INTUBATE Family Communication: Discussed with patient's son. Updated care and answered questions. Disposition Plan: To be determined   Consultants:   None  Procedures:   None  Antimicrobials:   IV cefepime  IV vancomycin    Subjective: seen this morning. Oriented only to self. Denied dyspnea or chest pain. As per discussion with son, patient has been gradually deteriorating over several weeks, decreased appetite for more than a month.  Objective:  Vitals:   11/14/16 2130 11/14/16 2200 11/15/16 0504 11/15/16 1024  BP: 124/72 (!) 121/59 (!) 114/46 (!) 125/56  Pulse: 84 96 76 78  Resp:  18 18   Temp:  (!) 100.4 F (38 C) 99.5 F (37.5 C) 98.9 F (37.2 C)  TempSrc:  Oral Oral Oral  SpO2: 91%  90% 95%  Weight: 96.7 kg (213 lb 1.6 oz)       Intake/Output Summary (Last 24 hours) at 11/15/16 1440 Last data filed at 11/14/16 2056  Gross per 24 hour  Intake             1050 ml  Output  0 ml  Net             1050 ml   Filed Weights   11/14/16 2130  Weight: 96.7 kg (213 lb 1.6 oz)    Examination:  General exam: Elderly male, moderately built, frail and chronically ill-looking, lying comfortably propped up in bed. Respiratory system: Harsh and reduced breath sounds  bilaterally, right greater than left. Occasional basal crackles. No rhonchi or wheezing. Respiratory effort normal. Cardiovascular system: S1 & S2 heard, RRR. No JVD, murmurs, rubs, gallops or clicks. No pedal edema. Telemetry: Sinus rhythm.? An episode of A. fib. Gastrointestinal system: Abdomen is nondistended, soft and nontender. No organomegaly or masses felt. Normal bowel sounds heard. Central nervous system: Alert and oriented only to self. No focal neurological deficits. Extremities: Symmetric 5 x 5 power. Skin: No rashes, lesions or ulcers Psychiatry: Judgement and insight impaired. Mood & affect appropriate.     Data Reviewed: I have personally reviewed following labs and imaging studies  CBC:  Recent Labs Lab 11/14/16 1801 11/15/16 0448  WBC 11.3* 11.2*  HGB 11.1* 10.6*  HCT 32.9* 32.1*  MCV 87.3 87.7  PLT 190 081   Basic Metabolic Panel:  Recent Labs Lab 11/14/16 1801 11/15/16 0448 11/15/16 0751  NA 139 139  --   K 3.2* 3.0*  --   CL 106 107  --   CO2 23 21*  --   GLUCOSE 138* 130*  --   BUN 13 10  --   CREATININE 1.21 1.04  --   CALCIUM 8.7* 8.2*  --   MG  --   --  1.7   GFR: Estimated Creatinine Clearance: 68.1 mL/min (by C-G formula based on SCr of 1.04 mg/dL). Liver Function Tests:  Recent Labs Lab 11/14/16 1801  AST 20  ALT 19  ALKPHOS 49  BILITOT 0.9  PROT 6.1*  ALBUMIN 2.6*   No results for input(s): LIPASE, AMYLASE in the last 168 hours. No results for input(s): AMMONIA in the last 168 hours. Coagulation Profile: No results for input(s): INR, PROTIME in the last 168 hours. Cardiac Enzymes:  Recent Labs Lab 11/15/16 0751 11/15/16 1223  TROPONINI <0.03 <0.03   BNP (last 3 results) No results for input(s): PROBNP in the last 8760 hours. HbA1C: No results for input(s): HGBA1C in the last 72 hours. CBG: No results for input(s): GLUCAP in the last 168 hours. Lipid Profile: No results for input(s): CHOL, HDL, LDLCALC, TRIG,  CHOLHDL, LDLDIRECT in the last 72 hours. Thyroid Function Tests: No results for input(s): TSH, T4TOTAL, FREET4, T3FREE, THYROIDAB in the last 72 hours. Anemia Panel: No results for input(s): VITAMINB12, FOLATE, FERRITIN, TIBC, IRON, RETICCTPCT in the last 72 hours.  Sepsis Labs:  Recent Labs Lab 11/14/16 1801 11/14/16 1826 11/15/16 0448  WBC 11.3*  --  11.2*  LATICACIDVEN  --  1.86  --     Recent Results (from the past 240 hour(s))  Culture, sputum-assessment     Status: None   Collection Time: 11/05/16 10:20 PM  Result Value Ref Range Status   Specimen Description SPUTUM  Final   Special Requests Immunocompromised  Final   Sputum evaluation THIS SPECIMEN IS ACCEPTABLE FOR SPUTUM CULTURE  Final   Report Status 11/05/2016 FINAL  Final  Culture, respiratory (NON-Expectorated)     Status: None   Collection Time: 11/05/16 10:20 PM  Result Value Ref Range Status   Specimen Description SPUTUM  Final   Special Requests Immunocompromised Reflexed from [id number]  Final  Gram Stain   Final    MODERATE WBC PRESENT, PREDOMINANTLY PMN ABUNDANT GRAM POSITIVE COCCI IN PAIRS FEW GRAM POSITIVE RODS FEW YEAST    Culture   Final    Consistent with normal respiratory flora. Performed at [facility], [place] [address], [place], [place] [address]    Report Status 11/08/2016 FINAL  Final  MRSA PCR Screening     Status: None   Collection Time: 11/15/16 12:12 AM  Result Value Ref Range Status   MRSA by PCR NEGATIVE NEGATIVE Final    Comment:        The GeneXpert MRSA Assay (FDA approved for NASAL specimens only), is one component of a comprehensive MRSA colonization surveillance program. It is not intended to diagnose MRSA infection nor to guide or monitor treatment for MRSA infections.          Radiology Studies: Ct Angio Chest Pe W Or Wo Contrast  Addendum Date: 11/14/2016   ADDENDUM REPORT: 11/14/2016 23:08 ADDENDUM: These results were called by telephone at the time of  interpretation on 11/14/2016 at 11:08 pm to Dr. Baltazar Najjar , who verbally acknowledged these results. Electronically Signed   By: Lucienne Capers M.D.   On: 11/14/2016 23:08   Result Date: 11/14/2016 CLINICAL DATA:  Chest pain EXAM: CT ANGIOGRAPHY CHEST WITH CONTRAST TECHNIQUE: Multidetector CT imaging of the chest was performed using the standard protocol during bolus administration of intravenous contrast. Multiplanar CT image reconstructions and MIPs were obtained to evaluate the vascular anatomy. CONTRAST:  100 mL Isovue 370 COMPARISON:  11/04/2016 FINDINGS: Cardiovascular: Good opacification of the central and segmental pulmonary arteries. Since the previous study, there are new filling defects in the distal right main pulmonary artery extending into upper, middle, and lower lobe branches. Appearance is consistent with acute pulmonary embolus. RV to LV ratio is 1.1 which may indicate right heart strain. No pericardial effusion. Normal caliber thoracic aorta without dissection. Calcification in the aorta and coronary arteries. Mediastinum/Nodes: Esophagus is decompressed. No significant lymphadenopathy in the chest. Lungs/Pleura: Persisting consolidation and volume loss in the left lower lung and left lingula with patchy nodular infiltration in the left upper lung, right upper lung, right middle lung, and right lower lung. This is consistent with history of non-small cell lung cancer although superimposed infectious process could also be present. No pleural effusions. No pneumothorax. Airways are patent. Upper Abdomen: No acute abnormality. Musculoskeletal: Sclerotic and lucent lesions in the T9 vertebra are likely metastatic. No change since prior study. Review of the MIP images confirms the above findings. IMPRESSION: New finding of acute pulmonary embolus involving right main, right upper lung, and right lower lung pulmonary arteries. There is CT evidence of right heart strain (RV/LV Ratio = 1.1) consistent with  at least submassive (intermediate risk) PE. The presence of right heart strain has been associated with an increased risk of morbidity and mortality. Please activate Code PE by paging [phone]. Consolidation in the left lower lung with patchy nodular ground-glass infiltrates throughout both lungs consistent with history of non-small cell lung cancer. Sclerotic lesion at T9, likely metastatic. These results were called by telephone at the time of interpretation on 11/14/2016 at 10:53 pm to [facility], the patient's nurse on [address], who verbally acknowledged these results. Attempting to reach the attending physician. Electronically Signed: By: Lucienne Capers M.D. On: 11/14/2016 22:55   Dg Chest Port 1 View  Result Date: 11/14/2016 CLINICAL DATA:  Acute on chronic dyspnea EXAM: PORTABLE CHEST 1 VIEW COMPARISON:  11/04/2016 CT, CXR 11/01/2016 FINDINGS: Heart is top-normal in size. There is aortic atherosclerosis without aneurysm. Pneumonic consolidations involving the left mid and lower lung and to a lesser degree right upper and lower lobes are identified consistent multifocal pneumonia, slightly worsened at the left lung base since previous exam. High-riding left humeral head may reflect chronic rotator cuff tear with narrowing the impingement space. No acute nor suspicious osseous abnormalities. IMPRESSION: Multifocal airspace opacities suspicious for pneumonia and/or atelectasis, slightly worsened at the left lung base since prior radiograph. Electronically Signed   By: Ashley Royalty M.D.   On: 11/14/2016 18:27        Scheduled Meds: . amiodarone  200 mg Oral Daily  . aspirin EC  81 mg Oral Daily  . ceFEPime (MAXIPIME) IV  1 g Intravenous Q8H  . diltiazem  180 mg Oral BID  . dorzolamide  1 drop Left Eye BID  . feeding supplement  1 Container Oral BID BM  . finasteride  5 mg Oral Daily  . folic acid  1 mg Oral Daily  . latanoprost  1 drop Both Eyes QHS  . metoprolol  50 mg Oral BID  .  pantoprazole  40 mg Oral Daily  . tamsulosin  0.4 mg Oral QPC supper  . vancomycin  1,250 mg Intravenous Q12H  . vitamin B-12  1,000 mcg Oral Daily   Continuous Infusions: . sodium chloride 75 mL/hr at 11/15/16 0308  . heparin 1,400 Units/hr (11/15/16 1216)     LOS: 1 day     [facility], MD [facility] Pager [phone]  If 7PM-7AM, please contact night-coverage [email or web address] Password [email or web address] 11/15/2016, 2:40 PM

## 2016-11-15 NOTE — Progress Notes (Signed)
ANTICOAGULATION & ANTIBIOTIC CONSULT NOTE - FOLLOW UP  Pharmacy Consult:  Heparin Indication: pulmonary embolus  No Known Allergies  Patient Measurements: Weight: 213 lb 1.6 oz (96.7 kg)  Heparin dosing weight = 97 kg  Vital Signs: Temp: 99.2 F (37.3 C) (02/14 1555) Temp Source: Oral (02/14 1555) BP: 104/51 (02/14 1555) Pulse Rate: 67 (02/14 1555)  Labs:  Recent Labs  11/14/16 1801 11/15/16 0448 11/15/16 0751 11/15/16 1223 11/15/16 1540  HGB 11.1* 10.6*  --   --   --   HCT 32.9* 32.1*  --   --   --   PLT 190 174  --   --   --   HEPARINUNFRC  --   --  0.64  --  0.50  CREATININE 1.21 1.04  --   --   --   TROPONINI  --   --  <0.03 <0.03  --     Estimated Creatinine Clearance: 68.1 mL/min (by C-G formula based on SCr of 1.04 mg/dL).    Assessment: 69 YOM with lung cancer presented from nursing facility with hypoxia, found to have PE on CTA and started on IV heparin.  Heparin level is therapeutic; no bleeding reported.  Confirmatory HL remains therapeutic at 0.5 on heparin 1400 units/hr. No issues with infusion or bleeding noted.  Goal of Therapy:  Heparin level 0.3-0.7 units/ml Monitor platelets by anticoagulation protocol: Yes    Plan:  Continue heparin 1400 units/hr Daily heparin level and CBC   Andrey Cota. Diona Foley, PharmD, BCPS Clinical Pharmacist [phone] 11/15/2016, 4:59 PM

## 2016-11-15 NOTE — Progress Notes (Signed)
ANTICOAGULATION & ANTIBIOTIC CONSULT NOTE - FOLLOW UP  Pharmacy Consult:  Heparin + Vancomycin/Cefepime Indication: pulmonary embolus + PNA/sepsis  No Known Allergies  Patient Measurements: Weight: 213 lb 1.6 oz (96.7 kg)  Heparin dosing weight = 97 kg  Vital Signs: Temp: 99.5 F (37.5 C) (02/14 0504) Temp Source: Oral (02/14 0504) BP: 114/46 (02/14 0504) Pulse Rate: 76 (02/14 0504)  Labs:  Recent Labs  11/14/16 1801 11/15/16 0448 11/15/16 0751  HGB 11.1* 10.6*  --   HCT 32.9* 32.1*  --   PLT 190 174  --   HEPARINUNFRC  --   --  0.64  CREATININE 1.21 1.04  --   TROPONINI  --   --  <0.03    Estimated Creatinine Clearance: 68.1 mL/min (by C-G formula based on SCr of 1.04 mg/dL).    Assessment: 49 YOM with lung cancer presented from nursing facility with hypoxia, found to have PE on CTA and started on IV heparin.  Heparin level is therapeutic; no bleeding reported.   Pharmacy also managing vancomycin and cefepime for PNA/sepsis.  Patient's renal function is improving.  He has a low grade fever and his WBC is mildly elevated.  Cefepime 2/13 >> Vanc 2/13 >>  2/13 BCx x2 -  2/14 MRSA PCR - negative   Goal of Therapy:  Heparin level 0.3-0.7 units/ml Monitor platelets by anticoagulation protocol: Yes  Vanc trough 15-20 mcg/mL    Plan:  - Continue heparin gtt at 1400 units/hr - Check confirmatory heparin level - Daily heparin level and CBC - F/U with oral AC when possible - Increase vanc to '1250mg'$  IV Q12H - Continue cefepime 1gm IV Q8H - Monitor renal fxn, clinical progress, vanc trough at Css    Matthew Berger D. Mina Marble, PharmD, BCPS Pager:  [phone] 11/15/2016, 9:27 AM

## 2016-11-15 NOTE — Progress Notes (Addendum)
Pt  Complaining  Of left side chest pain. Pt unable to give number just verbalize "It hurt!" Pt then stated CP had subsided and hurt only when coughing. VSS. EKG obtained. O2 91% on 3L increased O2 to 3 1/2 L . O2 stat now 94%. MD notified. Orders placed. Will continue to monitor. Isac Caddy, RN

## 2016-11-15 NOTE — Progress Notes (Signed)
New Admission Note:   Arrival Method: From ED via stretcher Mental Orientation: A&O Telemetry: Box 2W05 Assessment: Completed Skin: Intact IV: L FA      R FA Pain: denies any pain at this time Tubes: None Safety Measures: Safety Fall Prevention Plan has been discussed  Admission 2 Wesr Orientation: Patient has been orientated to the room, unit and staff.  Family: son at bedside  Orders to be reviewed and implemented. Will continue to monitor the patient. Call light has been placed within reach and bed alarm has been activated. Tele box applied. CCMD notified    Isac Caddy, RN

## 2016-11-15 NOTE — Progress Notes (Signed)
Pt c/o chest pain to RN. Not very specific in description of pain, but admits it occurs with cough. Troponins have been neg. EKG looks fine. Suspect this is all related to PE. Tramadol added for pain. Plan discussed with RN, Lars Mage.  KJK[facility], NP [facility]

## 2016-11-15 NOTE — Progress Notes (Addendum)
  Echocardiogram 2D Echocardiogram with Definity has been performed.  Haruye Lainez 11/15/2016, 1:40 PM

## 2016-11-16 LAB — BASIC METABOLIC PANEL
ANION GAP: 9 (ref 5–15)
BUN: 9 mg/dL (ref 6–20)
CO2: 20 mmol/L — AB (ref 22–32)
Calcium: 8.2 mg/dL — ABNORMAL LOW (ref 8.9–10.3)
Chloride: 110 mmol/L (ref 101–111)
Creatinine, Ser: 0.98 mg/dL (ref 0.61–1.24)
GFR calc non Af Amer: >60 mL/min (ref >60)
Glucose, Bld: 97 mg/dL (ref 65–99)
Potassium: 2.7 mmol/L — CL (ref 3.5–5.1)
Sodium: 139 mmol/L (ref 135–145)

## 2016-11-16 LAB — CBC
HEMATOCRIT: 29.5 % — AB (ref 39.0–52.0)
HEMOGLOBIN: 9.7 g/dL — AB (ref 13.0–17.0)
MCH: 28.5 pg (ref 26.0–34.0)
MCHC: 32.9 g/dL (ref 30.0–36.0)
MCV: 86.8 fL (ref 78.0–100.0)
Platelets: 181 10*3/uL (ref 150–400)
RBC: 3.4 MIL/uL — ABNORMAL LOW (ref 4.22–5.81)
RDW: 14.3 % (ref 11.5–15.5)
WBC: 9.4 10*3/uL (ref 4.0–10.5)

## 2016-11-16 LAB — LEGIONELLA PNEUMOPHILA SEROGP 1 UR AG: L. pneumophila Serogp 1 Ur Ag: NEGATIVE

## 2016-11-16 LAB — MAGNESIUM: MAGNESIUM: 1.7 mg/dL (ref 1.7–2.4)

## 2016-11-16 LAB — HEPARIN LEVEL (UNFRACTIONATED): HEPARIN UNFRACTIONATED: 0.58 [IU]/mL (ref 0.30–0.70)

## 2016-11-16 MED ORDER — POTASSIUM CHLORIDE 20 MEQ/15ML (10%) PO SOLN
30.0000 meq | ORAL | Status: AC
  Administered 2016-11-16 (×2): 30 meq via ORAL
  Filled 2016-11-16 (×2): qty 30

## 2016-11-16 MED ORDER — ENOXAPARIN SODIUM 100 MG/ML ~~LOC~~ SOLN
95.0000 mg | Freq: Once | SUBCUTANEOUS | Status: AC
  Administered 2016-11-16: 95 mg via SUBCUTANEOUS
  Filled 2016-11-16: qty 1

## 2016-11-16 MED ORDER — MAGNESIUM SULFATE IN D5W 1-5 GM/100ML-% IV SOLN
1.0000 g | Freq: Once | INTRAVENOUS | Status: AC
  Administered 2016-11-16: 1 g via INTRAVENOUS
  Filled 2016-11-16: qty 100

## 2016-11-16 MED ORDER — POTASSIUM CHLORIDE CRYS ER 20 MEQ PO TBCR
40.0000 meq | EXTENDED_RELEASE_TABLET | Freq: Once | ORAL | Status: AC
  Administered 2016-11-16: 40 meq via ORAL
  Filled 2016-11-16: qty 2

## 2016-11-16 MED ORDER — ENOXAPARIN SODIUM 100 MG/ML ~~LOC~~ SOLN
95.0000 mg | Freq: Two times a day (BID) | SUBCUTANEOUS | Status: DC
  Administered 2016-11-17 – 2016-11-18 (×3): 95 mg via SUBCUTANEOUS
  Filled 2016-11-16 (×3): qty 1

## 2016-11-16 NOTE — Progress Notes (Signed)
Patient can now be off droplet precautions per infectious disease, will continue to monitor

## 2016-11-16 NOTE — Progress Notes (Signed)
P's AM K+ 2.7. MD notified. New orders placed will continue to monitor. Isac Caddy, RN

## 2016-11-16 NOTE — Progress Notes (Signed)
PROGRESS NOTE  Matthew Berger  WUJ:[id number] DOB: 24-Sep-1936  DOA: 11/14/2016 PCP: [name], MD   Brief Narrative:  81 year old male with a PMH of metastatic non-small cell lung cancer, adenocarcinoma currently undergoing treatment with immunotherapy, anemia of chronic disease, chronic diastolic CHF, GERD, HLD, HTN, MAT versus A. fib, recent hospitalization 11/01/16-11/08/16 for acute respiratory failure with hypoxia secondary to influenza B, healthcare associated pneumonia and acute on chronic diastolic CHF, improved and was discharged to SNF. He completed course of antibiotics at SNF. He was discharged from SNF with home health services on 11/14/16. He presented to the ED on 11/14/16 with complaints of pleuritic chest pain and dyspnea. In the ED chest x-ray showed persistent infiltrates concerning for pneumonia but subsequent CTA chest showed acute PE with right heart strain. He was started on IV heparin. Transitioning to subcutaneous Lovenox on 2/15. Discussed with primary oncologist who advised that patient is hospice appropriate and recommended inpatient palliative care consultation for goals of care. Palliative care consulted 2/15.   Assessment & Plan:   Principal Problem:   Acute respiratory failure (HCC) Active Problems:   HTN (hypertension)   Non-small cell lung cancer (NSCLC) (HCC)   Multifocal atrial tachycardia (HCC)   Pneumonia   1. Acute PE (involving right main, right upper lung and right lower lung pulmonary arteries) with pleuritic chest pain: Confirmed on CTA chest. Associated right heart strain by CT consistent with at least PE. Started on IV heparin infusion per pharmacy. 2-D echo results are as below. Will need to be on long-term Lovenox given history of stage IV lung cancer. Discussed with CCM> continue IV Heparin, transition and no other intervention. Transition to full dose Lovenox on 2/15. Overnight chest pain may have been related to the PE versus lung cancer. 2. Stage  IV non-small cell lung cancer, adenocarcinoma: Based on CTA chest 11/14/16, consolidation in the left lower lung with patchy nodular ground glass infiltrates throughout both lungs consistent with history of non-small cell lung cancer and sclerotic metastasis to T9. Patient had low-grade fever 100.4, leukocytosis 12.2 and left lower lung consolidation on CT, on admission. Pro-calcitonin 0.15. Although all of his presentation may be related to underlying advanced lung cancer and PE, cannot definitively rule out healthcare associated pneumonia. Palliative care consultation for goals of care. Discussed with primary oncologist who agreed. 3. Possible healthcare associated pneumonia: Treating empirically with IV cefepime and vancomycin. MRSA screen negative. Blood cultures 2: Negative to date. Discontinue vancomycin. Continue cefepime alone. Pro-calcitonin 0.15. 4. Acute respiratory failure with hypoxia: Secondary to acute PE and possible pneumonia complicating advanced lung cancer. Treat supportively with oxygen. 5. Multifocal atrial tachycardia versus A. fib: Telemetry shows sinus rhythm.? Occasional episodes of A. fib. Already on anticoagulation with IV heparin >Lovenox. Continue amiodarone, diltiazem and metoprolol. 6. COPD: Stable without clinical bronchospasm. 7. BPH: Continue finasteride. 8. Chronic diastolic CHF: Clinically compensated. 9. Essential hypertension: Soft blood pressures this morning with SBP in the 90s. Added holding parameters for metoprolol and diltiazem. May have to reduce dose or even stop one of these if persists. 10. Hyperlipidemia: 11. Hypokalemia: Replace aggressively and follow. Magnesium 1.7. 12. Anemia: Gradually dropping hemoglobin in the absence of overt bleeding.? Dilutional. Follow CBC in a.m. 13. Adult failure to thrive: Multifactorial secondary to advanced age, stage IV lung cancer and multiple significant comorbidities. Palliative care consulted for goals of  care.   DVT prophylaxis: On IV heparin Code Status: DO NOT INTUBATE Family Communication: Discussed with patient's son on 11/15/16. Updated care and  answered questions. None at bedside today. Disposition Plan: To be determined   Consultants:   Palliative care team  Procedures:   2-D echo 11/15/16: Study Conclusions  - Left ventricle: The cavity size was normal. There was mild   concentric hypertrophy. Systolic function was normal. The   estimated ejection fraction was in the range of 60% to 65%. Wall   motion was normal; there were no regional wall motion   abnormalities. Features are consistent with a pseudonormal left   ventricular filling pattern, with concomitant abnormal relaxation   and increased filling pressure (grade 2 diastolic dysfunction).   Doppler parameters are consistent with indeterminate ventricular   filling pressure. - Right ventricle: The cavity size was moderately dilated. Wall   thickness was normal. Systolic function was mildly to moderately   reduced from the mid to apical wall. - Tricuspid valve: There was mild regurgitation. - Pulmonary arteries: Systolic pressure was mildly increased. PA   peak pressure: 39 mm Hg (S).  Antimicrobials:   IV cefepime  IV vancomycin -discontinued   Subjective: Denied dyspnea or cough. Overnight had transient chest pain, worse with deep breathing or coughing. No recurrence of chest pain.  Objective:  Vitals:   11/15/16 1941 11/15/16 2151 11/16/16 0423 11/16/16 0939  BP: (!) 112/48 (!) 112/48 (!) 98/43 (!) 95/50  Pulse: 71 70 69 63  Resp: '18  18 18  '$ Temp: 98.3 F (36.8 C)  98.8 F (37.1 C) 97.7 F (36.5 C)  TempSrc: Oral  Oral Oral  SpO2: 90% 94% 90% 90%  Weight:        Intake/Output Summary (Last 24 hours) at 11/16/16 1141 Last data filed at 11/16/16 0800  Gross per 24 hour  Intake              240 ml  Output                0 ml  Net              240 ml   Filed Weights   11/14/16 2130   Weight: 96.7 kg (213 lb 1.6 oz)    Examination:  General exam: Elderly male, moderately built, frail and chronically ill-looking, lying comfortably propped up in bed. Respiratory system: Harsh and reduced breath sounds bilaterally, right greater than left. Occasional basal crackles. No rhonchi or wheezing. Respiratory effort normal. Cardiovascular system: S1 & S2 heard, RRR. No JVD, murmurs, rubs, gallops or clicks. No pedal edema. Telemetry: Sinus rhythm.? An episode of A. fib with controlled ventricular rate. Gastrointestinal system: Abdomen is nondistended, soft and nontender. No organomegaly or masses felt. Normal bowel sounds heard. Central nervous system: Alert and oriented only to self. No focal neurological deficits. Extremities: Symmetric 5 x 5 power. Skin: No rashes, lesions or ulcers Psychiatry: Judgement and insight impaired. Mood & affect appropriate.     Data Reviewed: I have personally reviewed following labs and imaging studies  CBC:  Recent Labs Lab 11/14/16 1801 11/15/16 0448 11/16/16 0209  WBC 11.3* 11.2* 9.4  HGB 11.1* 10.6* 9.7*  HCT 32.9* 32.1* 29.5*  MCV 87.3 87.7 86.8  PLT 190 174 188   Basic Metabolic Panel:  Recent Labs Lab 11/14/16 1801 11/15/16 0448 11/15/16 0751 11/16/16 0209  NA 139 139  --  139  K 3.2* 3.0*  --  2.7*  CL 106 107  --  110  CO2 23 21*  --  20*  GLUCOSE 138* 130*  --  97  BUN 13 10  --  9  CREATININE 1.21 1.04  --  0.98  CALCIUM 8.7* 8.2*  --  8.2*  MG  --   --  1.7 1.7   GFR: Estimated Creatinine Clearance: 72.2 mL/min (by C-G formula based on SCr of 0.98 mg/dL). Liver Function Tests:  Recent Labs Lab 11/14/16 1801  AST 20  ALT 19  ALKPHOS 49  BILITOT 0.9  PROT 6.1*  ALBUMIN 2.6*   No results for input(s): LIPASE, AMYLASE in the last 168 hours. No results for input(s): AMMONIA in the last 168 hours. Coagulation Profile: No results for input(s): INR, PROTIME in the last 168 hours. Cardiac  Enzymes:  Recent Labs Lab 11/15/16 0751 11/15/16 1223 11/15/16 1903  TROPONINI <0.03 <0.03 <0.03   BNP (last 3 results) No results for input(s): PROBNP in the last 8760 hours. HbA1C: No results for input(s): HGBA1C in the last 72 hours. CBG: No results for input(s): GLUCAP in the last 168 hours. Lipid Profile: No results for input(s): CHOL, HDL, LDLCALC, TRIG, CHOLHDL, LDLDIRECT in the last 72 hours. Thyroid Function Tests: No results for input(s): TSH, T4TOTAL, FREET4, T3FREE, THYROIDAB in the last 72 hours. Anemia Panel: No results for input(s): VITAMINB12, FOLATE, FERRITIN, TIBC, IRON, RETICCTPCT in the last 72 hours.  Sepsis Labs:  Recent Labs Lab 11/14/16 1801 11/14/16 1826 11/15/16 0448 11/15/16 1540 11/16/16 0209  PROCALCITON  --   --   --  0.15  --   WBC 11.3*  --  11.2*  --  9.4  LATICACIDVEN  --  1.86  --   --   --     Recent Results (from the past 240 hour(s))  Blood culture (routine x 2)     Status: None (Preliminary result)   Collection Time: 11/14/16  5:55 PM  Result Value Ref Range Status   Specimen Description BLOOD RIGHT ANTECUBITAL  Final   Special Requests IN PEDIATRIC BOTTLE 4CC  Final   Culture NO GROWTH < 24 HOURS  Final   Report Status PENDING  Incomplete  Blood culture (routine x 2)     Status: None (Preliminary result)   Collection Time: 11/14/16  6:01 PM  Result Value Ref Range Status   Specimen Description BLOOD LEFT FOREARM  Final   Special Requests BOTTLES DRAWN AEROBIC ONLY 10CC  Final   Culture NO GROWTH < 24 HOURS  Final   Report Status PENDING  Incomplete  MRSA PCR Screening     Status: None   Collection Time: 11/15/16 12:12 AM  Result Value Ref Range Status   MRSA by PCR NEGATIVE NEGATIVE Final    Comment:        The GeneXpert MRSA Assay (FDA approved for NASAL specimens only), is one component of a comprehensive MRSA colonization surveillance program. It is not intended to diagnose MRSA infection nor to guide or monitor  treatment for MRSA infections.          Radiology Studies: Ct Angio Chest Pe W Or Wo Contrast  Addendum Date: 11/14/2016   ADDENDUM REPORT: 11/14/2016 23:08 ADDENDUM: These results were called by telephone at the time of interpretation on 11/14/2016 at 11:08 pm to Dr. Baltazar Najjar , who verbally acknowledged these results. Electronically Signed   By: Lucienne Capers M.D.   On: 11/14/2016 23:08   Result Date: 11/14/2016 CLINICAL DATA:  Chest pain EXAM: CT ANGIOGRAPHY CHEST WITH CONTRAST TECHNIQUE: Multidetector CT imaging of the chest was performed using the standard protocol during bolus administration of intravenous contrast. Multiplanar CT image reconstructions and  MIPs were obtained to evaluate the vascular anatomy. CONTRAST:  100 mL Isovue 370 COMPARISON:  11/04/2016 FINDINGS: Cardiovascular: Good opacification of the central and segmental pulmonary arteries. Since the previous study, there are new filling defects in the distal right main pulmonary artery extending into upper, middle, and lower lobe branches. Appearance is consistent with acute pulmonary embolus. RV to LV ratio is 1.1 which may indicate right heart strain. No pericardial effusion. Normal caliber thoracic aorta without dissection. Calcification in the aorta and coronary arteries. Mediastinum/Nodes: Esophagus is decompressed. No significant lymphadenopathy in the chest. Lungs/Pleura: Persisting consolidation and volume loss in the left lower lung and left lingula with patchy nodular infiltration in the left upper lung, right upper lung, right middle lung, and right lower lung. This is consistent with history of non-small cell lung cancer although superimposed infectious process could also be present. No pleural effusions. No pneumothorax. Airways are patent. Upper Abdomen: No acute abnormality. Musculoskeletal: Sclerotic and lucent lesions in the T9 vertebra are likely metastatic. No change since prior study. Review of the MIP images  confirms the above findings. IMPRESSION: New finding of acute pulmonary embolus involving right main, right upper lung, and right lower lung pulmonary arteries. There is CT evidence of right heart strain (RV/LV Ratio = 1.1) consistent with at least submassive (intermediate risk) PE. The presence of right heart strain has been associated with an increased risk of morbidity and mortality. Please activate Code PE by paging [phone]. Consolidation in the left lower lung with patchy nodular ground-glass infiltrates throughout both lungs consistent with history of non-small cell lung cancer. Sclerotic lesion at T9, likely metastatic. These results were called by telephone at the time of interpretation on 11/14/2016 at 10:53 pm to [facility], the patient's nurse on [address], who verbally acknowledged these results. Attempting to reach the attending physician. Electronically Signed: By: Lucienne Capers M.D. On: 11/14/2016 22:55   Dg Chest Port 1 View  Result Date: 11/14/2016 CLINICAL DATA:  Acute on chronic dyspnea EXAM: PORTABLE CHEST 1 VIEW COMPARISON:  11/04/2016 CT, CXR 11/01/2016 FINDINGS: Heart is top-normal in size. There is aortic atherosclerosis without aneurysm. Pneumonic consolidations involving the left mid and lower lung and to a lesser degree right upper and lower lobes are identified consistent multifocal pneumonia, slightly worsened at the left lung base since previous exam. High-riding left humeral head may reflect chronic rotator cuff tear with narrowing the impingement space. No acute nor suspicious osseous abnormalities. IMPRESSION: Multifocal airspace opacities suspicious for pneumonia and/or atelectasis, slightly worsened at the left lung base since prior radiograph. Electronically Signed   By: Ashley Royalty M.D.   On: 11/14/2016 18:27        Scheduled Meds: . amiodarone  200 mg Oral Daily  . aspirin EC  81 mg Oral Daily  . ceFEPime (MAXIPIME) IV  1 g Intravenous Q8H  . diltiazem  180 mg  Oral BID  . dorzolamide  1 drop Left Eye BID  . feeding supplement  1 Container Oral BID BM  . finasteride  5 mg Oral Daily  . folic acid  1 mg Oral Daily  . latanoprost  1 drop Both Eyes QHS  . metoprolol  50 mg Oral BID  . pantoprazole  40 mg Oral Daily  . tamsulosin  0.4 mg Oral QPC supper  . vancomycin  1,250 mg Intravenous Q12H  . vitamin B-12  1,000 mcg Oral Daily   Continuous Infusions: . heparin 1,400 Units/hr (11/16/16 0546)     LOS: 2  days     [facility], MD [facility] Pager [phone]  If 7PM-7AM, please contact night-coverage [email or web address] Password [facility] 11/16/2016, 11:41 AM

## 2016-11-16 NOTE — Progress Notes (Addendum)
ANTICOAGULATION CONSULT NOTE - FOLLOW UP  Pharmacy Consult:  Heparin Indication: pulmonary embolus  No Known Allergies  Patient Measurements: Weight: 213 lb 1.6 oz (96.7 kg)  Heparin dosing weight = 97 kg  Vital Signs: Temp: 98.8 F (37.1 C) (02/15 0423) Temp Source: Oral (02/15 0423) BP: 98/43 (02/15 0423) Pulse Rate: 69 (02/15 0423)  Labs:  Recent Labs  11/14/16 1801 11/15/16 0448 11/15/16 0751 11/15/16 1223 11/15/16 1540 11/15/16 1903 11/16/16 0209  HGB 11.1* 10.6*  --   --   --   --  9.7*  HCT 32.9* 32.1*  --   --   --   --  29.5*  PLT 190 174  --   --   --   --  181  HEPARINUNFRC  --   --  0.64  --  0.50  --  0.58  CREATININE 1.21 1.04  --   --   --   --  0.98  TROPONINI  --   --  <0.03 <0.03  --  <0.03  --     Estimated Creatinine Clearance: 72.2 mL/min (by C-G formula based on SCr of 0.98 mg/dL).    Assessment: 55 YOM with lung cancer presented from nursing facility with hypoxia, found to have PE on CTA and started on IV heparin.  Heparin level is therapeutic and stable; no bleeding reported.   Goal of Therapy:  Heparin level 0.3-0.7 units/ml Monitor platelets by anticoagulation protocol: Yes     Plan:  - Continue heparin gtt at 1400 units/hr - Daily heparin level and CBC - F/U with oral AC when possible - Consider reducing metoprolol    Bernhardt Riemenschneider D. Mina Marble, PharmD, BCPS Pager:  [phone] 11/16/2016, 7:32 AM    ================================   Addendum: - transition patient to Lovenox   Plan: - Stop heparin gtt at 1300, one hour later give Lovenox '95mg'$  SQ x 1 (RN aware) - Lovenox '95mg'$  SQ Q12H - CBC Q72H while on Lovenox    Canton Yearby D. Mina Marble, PharmD, BCPS Pager:  [phone] 11/16/2016, 12:21 PM

## 2016-11-16 NOTE — Progress Notes (Deleted)
ANTICOAGULATION CONSULT NOTE - FOLLOW UP  Pharmacy Consult:  Heparin Indication: pulmonary embolus  No Known Allergies  Patient Measurements: Weight: 213 lb 1.6 oz (96.7 kg)  Heparin dosing weight = 97 kg  Vital Signs: Temp: 98.8 F (37.1 C) (02/15 0423) Temp Source: Oral (02/15 0423) BP: 98/43 (02/15 0423) Pulse Rate: 69 (02/15 0423)  Labs:  Recent Labs  11/14/16 1801 11/15/16 0448 11/15/16 0751 11/15/16 1223 11/15/16 1540 11/15/16 1903 11/16/16 0209  HGB 11.1* 10.6*  --   --   --   --  9.7*  HCT 32.9* 32.1*  --   --   --   --  29.5*  PLT 190 174  --   --   --   --  181  HEPARINUNFRC  --   --  0.64  --  0.50  --  0.58  CREATININE 1.21 1.04  --   --   --   --  0.98  TROPONINI  --   --  <0.03 <0.03  --  <0.03  --     Estimated Creatinine Clearance: 72.2 mL/min (by C-G formula based on SCr of 0.98 mg/dL).    Assessment: 73 YOM with lung cancer presented from nursing facility with hypoxia, found to have PE on CTA and started on IV heparin.  Heparin level is therapeutic and stable; no bleeding reported.   Goal of Therapy:  Heparin level 0.3-0.7 units/ml Monitor platelets by anticoagulation protocol: Yes     Plan:  - Continue heparin gtt at 1400 units/hr - Daily heparin level and CBC - F/U with oral AC when possible - F/U Mag supplementation - Consider reducing metoprolol    Tremaine Earwood D. Mina Marble, PharmD, BCPS Pager:  [phone] 11/16/2016, 7:36 AM

## 2016-11-17 DIAGNOSIS — Z7189 Other specified counseling: Secondary | ICD-10-CM

## 2016-11-17 DIAGNOSIS — J9601 Acute respiratory failure with hypoxia: Secondary | ICD-10-CM

## 2016-11-17 DIAGNOSIS — Z515 Encounter for palliative care: Secondary | ICD-10-CM

## 2016-11-17 LAB — BASIC METABOLIC PANEL
Anion gap: 8 (ref 5–15)
BUN: 9 mg/dL (ref 6–20)
CALCIUM: 8.5 mg/dL — AB (ref 8.9–10.3)
CO2: 21 mmol/L — ABNORMAL LOW (ref 22–32)
CREATININE: 1.01 mg/dL (ref 0.61–1.24)
Chloride: 109 mmol/L (ref 101–111)
GFR calc non Af Amer: >60 mL/min (ref >60)
Glucose, Bld: 99 mg/dL (ref 65–99)
Potassium: 3.3 mmol/L — ABNORMAL LOW (ref 3.5–5.1)
SODIUM: 138 mmol/L (ref 135–145)

## 2016-11-17 LAB — MAGNESIUM: MAGNESIUM: 1.9 mg/dL (ref 1.7–2.4)

## 2016-11-17 LAB — PROCALCITONIN: Procalcitonin: <0.1 ng/mL

## 2016-11-17 MED ORDER — AMOXICILLIN-POT CLAVULANATE 500-125 MG PO TABS
1.0000 | ORAL_TABLET | Freq: Three times a day (TID) | ORAL | Status: DC
  Administered 2016-11-17 – 2016-11-18 (×4): 500 mg via ORAL
  Filled 2016-11-17 (×5): qty 1

## 2016-11-17 MED ORDER — DILTIAZEM HCL ER COATED BEADS 120 MG PO CP24
120.0000 mg | ORAL_CAPSULE | Freq: Every day | ORAL | Status: DC
  Administered 2016-11-18: 120 mg via ORAL
  Filled 2016-11-17: qty 1

## 2016-11-17 MED ORDER — LORAZEPAM 2 MG/ML PO CONC: 1.0000 mg | ORAL | Status: DC | PRN

## 2016-11-17 MED ORDER — SODIUM CHLORIDE 0.9% FLUSH
3.0000 mL | Freq: Two times a day (BID) | INTRAVENOUS | Status: DC
  Administered 2016-11-17: 3 mL via INTRAVENOUS

## 2016-11-17 MED ORDER — ENOXAPARIN (LOVENOX) PATIENT EDUCATION KIT
PACK | Freq: Once | Status: DC
  Filled 2016-11-17 (×2): qty 1

## 2016-11-17 MED ORDER — MORPHINE SULFATE (CONCENTRATE) 10 MG/0.5ML PO SOLN: 5.0000 mg | ORAL | Status: DC | PRN

## 2016-11-17 MED ORDER — DILTIAZEM HCL ER COATED BEADS 120 MG PO CP24
120.0000 mg | ORAL_CAPSULE | Freq: Two times a day (BID) | ORAL | Status: DC
  Administered 2016-11-17: 120 mg via ORAL
  Filled 2016-11-17: qty 1

## 2016-11-17 MED ORDER — POTASSIUM CHLORIDE CRYS ER 20 MEQ PO TBCR
40.0000 meq | EXTENDED_RELEASE_TABLET | Freq: Once | ORAL | Status: AC
  Administered 2016-11-17: 40 meq via ORAL

## 2016-11-17 MED ORDER — LORAZEPAM 2 MG/ML IJ SOLN: 1.0000 mg | INTRAMUSCULAR | Status: DC | PRN

## 2016-11-17 MED ORDER — SODIUM CHLORIDE 0.9% FLUSH: 3.0000 mL | INTRAVENOUS | Status: DC | PRN

## 2016-11-17 MED ORDER — LORAZEPAM 1 MG PO TABS
1.0000 mg | ORAL_TABLET | ORAL | Status: DC | PRN
Start: 2016-11-17 — End: 2016-11-18

## 2016-11-17 MED ORDER — SODIUM CHLORIDE 0.9 % IV SOLN: 250.0000 mL | INTRAVENOUS | Status: DC | PRN

## 2016-11-17 NOTE — Progress Notes (Signed)
[facility] Progress Note  Patient: Matthew Berger SEG:[id number]   PCP: [name], MD DOB: 09/24/1936   DOA: 11/14/2016   DOS: 11/17/2016   Date of Service: the patient was seen and examined on 11/17/2016   Subjective: feeling better, no chest pain, no shortnes of breath, no nausea and vomiting. No active bleeding.   Brief hospital course: 81 year old male with a PMH of metastatic non-small cell lung cancer, adenocarcinoma currently undergoing treatment with immunotherapy, anemia of chronic disease, chronic diastolic CHF, GERD, HLD, HTN, MAT versus A. fib, recent hospitalization 11/01/16-11/08/16 for acute respiratory failure with hypoxia secondary to influenza B, healthcare associated pneumonia and acute on chronic diastolic CHF, improved and was discharged to SNF. He completed course of antibiotics at SNF. He was discharged from SNF with home health services on 11/14/16. He presented to the ED on 11/14/16 with complaints of pleuritic chest pain and dyspnea. In the ED chest x-ray showed persistent infiltrates concerning for pneumonia but subsequent CTA chest showed acute PE with right heart strain. He was started on IV heparin. Transitioning to subcutaneous Lovenox on 2/15. Discussed with primary oncologist who advised that patient is hospice appropriate and recommended inpatient palliative care consultation for goals of care. Palliative care consulted 2/15. Currently further plan is monitor palliative care consultation.  Assessment and Plan: 1. Acute respiratory failure (HCC)  Acute PE (involving right main, right upper lung and right lower lung pulmonary arteries) with pleuritic chest pain: Confirmed on CTA chest. Associated right heart strain by CT consistent with at least PE. Started on IV heparin infusion per pharmacy. 2-D echo EF 60-65%, RV function reduced, diastolic dysfunction  Will need to be on long-term Lovenox given history of stage IV lung cancer. Discussed with CCM, no other  intervention. On full dose Lovenox on 2/15.  2.Stage IV non-small cell lung cancer, adenocarcinoma:  Based on CTA chest 11/14/16, consolidation in the left lower lung with patchy nodular ground glass infiltrates throughout both lungs consistent with history of non-small cell lung cancer and sclerotic metastasis to T9.  Palliative care consultation for goals of care. Discussed with primary oncologist who agreed.  3. Possible healthcare associated pneumonia:  Was on empirically with IV cefepime and vancomycin. MRSA screen negative. Blood cultures 2: Negative to date. Discontinue IV Antibiotics. Pro-calcitonin 0.15. Continue augmentin.   4. Multifocal atrial tachycardia versus A. Fib:  Telemetry shows sinus rhythm.? Occasional episodes of A. fib. Already on anticoagulation with IV heparin >Lovenox. Continue amiodarone, diltiazem and metoprolol  5. COPD:  Stable without clinical bronchospasm.  6. BPH:  Continue finasteride.  7. Chronic diastolic CHF:  Clinically compensated.  8. Essential hypertension:  Soft blood pressures this morning with SBP in the 90s.  Holding parameters metoprolol and reduce dose of diltiazem.  9. Hyperlipidemia:  10. Hypokalemia:  Replace and recheck  11. Anemia:  Gradually dropping hemoglobin in the absence of overt bleeding.? Dilutional. Follow CBC in a.m.  12. Adult failure to thrive:  Multifactorial secondary to advanced age, stage IV lung cancer and multiple significant comorbidities. Palliative care consulted for goals of care  Bowel regimen: last BM 11/16/2016 Diet: heart healthy diet  Advance goals of care discussion: DNI  Family Communication: family was present at bedside, at the time of interview. The pt provided permission to discuss medical plan with the family. Opportunity was given to ask question and all questions were answered satisfactorily.   Disposition:  Discharge to SNF. Expected discharge date: 11/19/2016,  Consultants:  none Procedures: none  Antibiotics: Anti-infectives  Start     Dose/Rate Route Frequency Ordered Stop   11/17/16 0800  amoxicillin-clavulanate (AUGMENTIN) 500-125 MG per tablet 500 mg     1 tablet Oral Every 8 hours 11/17/16 0730     11/15/16 1600  vancomycin (VANCOCIN) 1,250 mg in sodium chloride 0.9 % 250 mL IVPB  Status:  Discontinued     1,250 mg 166.7 mL/hr over 90 Minutes Intravenous Every 12 hours 11/15/16 0930 11/16/16 1154   11/15/16 0700  vancomycin (VANCOCIN) IVPB 750 mg/150 ml premix  Status:  Discontinued     750 mg 150 mL/hr over 60 Minutes Intravenous Every 12 hours 11/14/16 1858 11/15/16 0930   11/15/16 0300  ceFEPIme (MAXIPIME) 2 g in dextrose 5 % 50 mL IVPB  Status:  Discontinued     2 g 100 mL/hr over 30 Minutes Intravenous Every 8 hours 11/14/16 1858 11/14/16 2235   11/15/16 0300  ceFEPIme (MAXIPIME) 1 g in dextrose 5 % 50 mL IVPB  Status:  Discontinued     1 g 100 mL/hr over 30 Minutes Intravenous Every 8 hours 11/14/16 2219 11/17/16 0727   11/14/16 1845  ceFEPIme (MAXIPIME) 2 g in dextrose 5 % 50 mL IVPB     2 g 100 mL/hr over 30 Minutes Intravenous  Once 11/14/16 1835 11/14/16 1950   11/14/16 1830  piperacillin-tazobactam (ZOSYN) IVPB 3.375 g  Status:  Discontinued     3.375 g 100 mL/hr over 30 Minutes Intravenous  Once 11/14/16 1827 11/14/16 1833   11/14/16 1830  vancomycin (VANCOCIN) 2,000 mg in sodium chloride 0.9 % 500 mL IVPB     2,000 mg 250 mL/hr over 120 Minutes Intravenous  Once 11/14/16 1828 11/14/16 2039        Objective: Physical Exam: Vitals:   11/16/16 1456 11/16/16 2125 11/17/16 0429 11/17/16 0810  BP: (!) 102/50 (!) 98/43 (!) 105/51 (!) 108/52  Pulse: 71 69 78 80  Resp: '20 19 19 18  '$ Temp: 97.5 F (36.4 C) 98.3 F (36.8 C) 98.6 F (37 C) 98.6 F (37 C)  TempSrc: Oral Oral Oral Oral  SpO2: 90% 96% 93% 96%  Weight:        Intake/Output Summary (Last 24 hours) at 11/17/16 1323 Last data filed at 11/17/16 1012  Gross per 24 hour    Intake              250 ml  Output              705 ml  Net             -455 ml   Filed Weights   11/14/16 2130  Weight: 96.7 kg (213 lb 1.6 oz)    General: Alert, Awake and Oriented to Time, Place and Person. Appear in mild distress, affect appropriate Eyes: PERRL, Conjunctiva normal ENT: Oral Mucosa clear moist. Neck: difficult to assess JVD, no Abnormal Mass Or lumps Cardiovascular: S1 and S2 Present, aortic systolic Murmur, Respiratory: Bilateral Air entry equal and Decreased, no use of accessory muscle, basal Crackles, no wheezes Abdomen: Bowel Sound present, Soft and no tenderness Skin: no redness, no Rash, no induration Extremities: trace Pedal edema, no calf tenderness Neurologic: Grossly no focal neuro deficit. Bilaterally Equal motor strength  Data Reviewed: CBC:  Recent Labs Lab 11/13/16 11/14/16 1801 11/15/16 0448 11/16/16 0209  WBC 8.5 11.3* 11.2* 9.4  HGB 11.7* 11.1* 10.6* 9.7*  HCT 37* 32.9* 32.1* 29.5*  MCV  --  87.3 87.7 86.8  PLT 195 190 174  734   Basic Metabolic Panel:  Recent Labs Lab 11/14/16 1801 11/15/16 0448 11/15/16 0751 11/16/16 0209 11/17/16 0435  NA 139 139  --  139 138  K 3.2* 3.0*  --  2.7* 3.3*  CL 106 107  --  110 109  CO2 23 21*  --  20* 21*  GLUCOSE 138* 130*  --  97 99  BUN 13 10  --  9 9  CREATININE 1.21 1.04  --  0.98 1.01  CALCIUM 8.7* 8.2*  --  8.2* 8.5*  MG  --   --  1.7 1.7 1.9   Liver Function Tests:  Recent Labs Lab 11/14/16 1801  AST 20  ALT 19  ALKPHOS 49  BILITOT 0.9  PROT 6.1*  ALBUMIN 2.6*   No results for input(s): LIPASE, AMYLASE in the last 168 hours. No results for input(s): AMMONIA in the last 168 hours. Coagulation Profile: No results for input(s): INR, PROTIME in the last 168 hours. Cardiac Enzymes:  Recent Labs Lab 11/15/16 0751 11/15/16 1223 11/15/16 1903  TROPONINI <0.03 <0.03 <0.03   BNP (last 3 results) No results for input(s): PROBNP in the last 8760 hours.  CBG: No results  for input(s): GLUCAP in the last 168 hours.  Studies: No results found.   Scheduled Meds: . amiodarone  200 mg Oral Daily  . amoxicillin-clavulanate  1 tablet Oral Q8H  . aspirin EC  81 mg Oral Daily  . diltiazem  120 mg Oral BID  . dorzolamide  1 drop Left Eye BID  . enoxaparin (LOVENOX) injection  95 mg Subcutaneous Q12H  . feeding supplement  1 Container Oral BID BM  . finasteride  5 mg Oral Daily  . folic acid  1 mg Oral Daily  . latanoprost  1 drop Both Eyes QHS  . metoprolol  50 mg Oral BID  . pantoprazole  40 mg Oral Daily  . tamsulosin  0.4 mg Oral QPC supper  . vitamin B-12  1,000 mcg Oral Daily   Continuous Infusions: PRN Meds: acetaminophen **OR** acetaminophen, bisacodyl, guaiFENesin-dextromethorphan, ipratropium-albuterol, ondansetron **OR** ondansetron (ZOFRAN) IV, traMADol  Time spent: 30 minutes  Author: Berle Mull, MD [facility] Pager: [phone] 11/17/2016 1:23 PM  If 7PM-7AM, please contact night-coverage at [email or web address], password [facility]

## 2016-11-17 NOTE — Progress Notes (Signed)
[facility] Liaison: RN  Notified by Matthew Berger, of patient/family request for Hospice and [facility] services at home after discharge.  Chart and patient information will be review with Dr. Brigid Re, [place] Director.  Hospice eligibility pending.  Writer spoke with Matthew Berger over the phone to initiate education related to hospice philosophy, services and team approach to care.  Family had just left the hospital.  Family voiced understanding of information provided.    Per discussion, plan is for discharge to home by personal vehicle tomorrow, 11/18/16.  Patient will need prescriptions for discharge comfort medications.   DME needs discussed and patient has rollator, walker, cane at home.  Family request the following DME for delivery to the home at earliest availability:  Transport chair, hospital bed 1/2 rails, OBT, BSC and high flow O2 concentrator (to be switched out with low flow).  Also ordered portable tank to be delivered to hospital room for transport home use.  [facility] Chartered certified accountant, Matthew Berger notified and will contact [place] to arrange delivery to the home.  The home address has been verified and is correct in the chart; Matthew Berger is the family member to be contacted to arrange time of delivery.  [facility] Referral Center aware of the above. Complete d/c summary will need to be faxed to [facility] at [phone], when final.  Please notify [facility] when patient is ready to leave unit at discharge --[phone] or [phone] after hours or on weekend.  [facility] information and contact numbers have been given directly to the patient during visit.  Above information shared with Matthew Berger, [facility].  Thank you for the referral.  Edyth Gunnels, RN, [facility] Liaison [phone]  All hospital liaison's are now on [place].  Please feel free to call me at the above number or call [phone] after 5pm.

## 2016-11-17 NOTE — Progress Notes (Signed)
PT Cancellation Note  Patient Details Name: CHIP CANEPA MRN: [id number] DOB: [birth year]   Cancelled Treatment:    Reason Eval/Treat Not Completed: Patient at procedure or test/unavailable. Pt in a meeting with Palliative. PT to return as able.   Jathniel Smeltzer M Jermaine Neuharth 11/17/2016, 3:18 PM   Kittie Plater, PT, DPT Pager #: [phone] Office #: [phone]

## 2016-11-17 NOTE — Care Management Note (Signed)
Case Management Note Marvetta Gibbons RN, BSN Unit 2W-Case Manager [phone]  Patient Details  Name: KAYLEN NGHIEM MRN: [id number] Date of Birth: 01-Mar-1936  Subjective/Objective:  Pt admitted with acute resp. Failure,                   Action/Plan: PTA pt recently discharged to SNF, prior to that from home- referral received for home Hospice- spoke with pt at bedside to offer choice and per pt request call made to son [name] via TC confirmed choice for Home Hospice to be [facility]- also discussed discharge needs for DME with [name]- pt has a rollator and RW at home and had night-time 02 with [facility]- will need contineous home 02 now, [facility], transport chair and hospital bed.- per son would like to transport via private vehicle - would need portable 02 tank for transport. Referral called to Edyth Gunnels with [facility] for Home Hospice- d/c needs for DME given to Amy- family would like to d/c 2/17- if all arrangements can be made.   Expected Discharge Date:    11/18/16              Expected Discharge Plan:  [place] Referral:     Discharge planning Services  CM Consult  Post Acute Care Choice:  Hospice Choice offered to:  Patient, Adult Children  DME Arranged:  Hospital bed, 3-N-1, Other see comment DME Agency:  [place]:  RN [facility] Agency:  Hospice and [place]  Status of Service:  Completed, signed off  If discussed at [place] of Stay Meetings, dates discussed:    Discharge Disposition: home with Home Hospice   Additional Comments:  Dawayne Patricia, RN 11/17/2016, 4:03 PM

## 2016-11-17 NOTE — Consult Note (Signed)
Consultation Note Date: 11/17/2016   Patient Name: Matthew Berger  DOB: 04-28-1936  MRN: [id number]  Age / Sex: 81 y.o., male  PCP: [name], MD Referring Physician: Lavina Hamman, MD  Reason for Consultation: Establishing goals of care in light of stage 4 non small cell lung cancer  HPI/Patient Profile: 81 y.o. male  with past medical history of NSCLCA, and diastolic heart failure  who was admitted on 11/14/2016 with chest pain and shortness of breath.  CT Angio chest reveal PE.  2D echo showed right heart strain secondary to PE.  Clinical Assessment and Goals of Care:  I have reviewed medical records including EPIC notes, imaging, and labs, received report from the bedside RN, assessed the patient and then met at the bedside along with the patient's wife, son, and daughter in law  to discuss diagnosis prognosis, GOC, EOL wishes, disposition and options.  I introduced Palliative Medicine as specialized medical care for people living with serious illness. It focuses on providing relief from the symptoms and stress of a serious illness. The goal is to improve quality of life for both the patient and the family.  We discussed a brief life review of the patient. 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  Then I assessed functional and nutritional status at home by gathering history.  He is at home with his wife and daughter.  He lives primarily a bed to chair existence.  He is eating very little.  He becomes very SOB and weak when he attempts to stand.  He is fortunate to have family members at home who can provide care for him.    We discussed their current illness and what in means in the larger context of their on-going co-morbidities.  Natural disease trajectory and expectations at EOL were discussed.  In particular the family was concerned about his low blood pressure.  I explained that if he accepted Hospice services  we would focus on his comfort and likely stop checking his blood pressure.  I attempted to elicit values and goals of care important to the patient.  His primary goal is to be at home with his family.  He has a large family and they are his priority.  The patient as well as his son and daughter in law understand that he is near end of life and want him to be comfortable.  We discussed Hospice House  - while the patient is not opposed to hospice house he would like to be at home as long as possible.  Hospice and Palliative Care services outpatient were explained and offered.  The family would like to speak with a Hospice representative in the hospital and make arrangements for the patient to go home tomorrow if possible.  Questions and concerns were addressed.   The family was encouraged to call with questions or concerns.    Primary Decision Maker:  PATIENT and his son [name]    SUMMARY OF RECOMMENDATIONS    D/C to home as soon as practical  with Hospice Services.  Patient will need DME equipment and oxygen thru Hospice.  Patient with large PE/right heart strain.  Will defer decisions regarding continuation of the Lovenox to Hospice.  Code Status/Advance Care Planning:  Limited code    Symptom Management:   Will add low dose morphine for dyspnea and low dose ativan for anxiety or insomnia.   However patient is currently comfortable.  Psycho-social/Spiritual:   Desire for further Chaplaincy support: Yes.  Patient is Matthew Berger and his son is a Theme park manager.  Prognosis:   < 2 weeks given PE with right sided heart strain on 4-5 liters of oxygen with low BP in the setting of metastatic NSCLCA.  Discharge Planning: Home with Hospice      Primary Diagnoses: Present on Admission: . Pneumonia . Acute respiratory failure ([place]) . HTN (hypertension) . Multifocal atrial tachycardia (HCC) . Non-small cell lung cancer (NSCLC) ([place])   I have reviewed the medical record, interviewed  the patient and family, and examined the patient. The following aspects are pertinent.  Past Medical History:  Diagnosis Date  . Atrial fibrillation ([place])   . CHF (congestive heart failure) ([place])   . Encounter for antineoplastic chemotherapy 01/05/2016  . Encounter for antineoplastic immunotherapy 07/12/2016  . GERD (gastroesophageal reflux disease)   . Hypercholesteremia   . Hypertension   . Non-small cell lung cancer (NSCLC) ([place]) 11/19/15  . Pneumonia 11/18/2015   Social History   Social History  . Marital status: Married    Spouse name: N/A  . Number of children: Y8  . Years of education: N/A   Occupational History  . retired Administrator    Social History Main Topics  . Smoking status: Former Smoker    Packs/day: 2.00    Years: 40.00    Types: Cigarettes    Quit date: 10/02/1990  . Smokeless tobacco: Never Used     Comment: smoked 1-2 ppd.   . Alcohol use No  . Drug use: No  . Sexual activity: Not Currently   Other Topics Concern  . None   Social History Narrative   Originally from [place]. Always lived in [place]. Previously has traveled to [place], [place], [place], [place], [place], [place], [place], & [place]. Previously worked driving garbage truck. No pets currently. No known mold exposure.    Family History  Problem Relation Age of Onset  . Breast cancer Sister   . Stroke Sister   . Colon cancer Brother   . Cancer Maternal Aunt     x2  . Hypertension Mother   . Stroke Mother   . Breast cancer Cousin   . Diabetes Other   . Hypertension Other   . Hyperlipidemia Other    Scheduled Meds: . amiodarone  200 mg Oral Daily  . amoxicillin-clavulanate  1 tablet Oral Q8H  . aspirin EC  81 mg Oral Daily  . [START ON 11/18/2016] diltiazem  120 mg Oral Daily  . dorzolamide  1 drop Left Eye BID  . enoxaparin (LOVENOX) injection  95 mg Subcutaneous Q12H  . enoxaparin   Does not apply Once  . feeding supplement  1 Container Oral BID BM  . finasteride  5 mg Oral Daily  . folic acid  1 mg Oral Daily  .  latanoprost  1 drop Both Eyes QHS  . pantoprazole  40 mg Oral Daily  . tamsulosin  0.4 mg Oral QPC supper  . vitamin B-12  1,000 mcg Oral Daily   Continuous Infusions: PRN Meds:.acetaminophen **OR** acetaminophen,  bisacodyl, guaiFENesin-dextromethorphan, ipratropium-albuterol, ondansetron **OR** ondansetron (ZOFRAN) IV, traMADol No Known Allergies Review of Systems ++ Weakness and SOB with any movement Denies CP, dysphagia, AP, changes in bowel habits, dysuria  Physical Exam  Wd elderly black gentleman, pleasant, A&O cooperative in NAD  Vital Signs: BP (!) 86/40 (BP Location: Left Arm)   Pulse 62   Temp 98.6 F (37 C) (Oral)   Resp 17   Wt 96.7 kg (213 lb 1.6 oz)   SpO2 95%   BMI 27.36 kg/m  Pain Assessment: No/denies pain   Pain Score: 0-No pain   SpO2: SpO2: 95 % O2 Device:SpO2: 95 % O2 Flow Rate: .O2 Flow Rate (L/min): 5 L/min (4 Litres)  IO: Intake/output summary:  Intake/Output Summary (Last 24 hours) at 11/17/16 1550 Last data filed at 11/17/16 1400  Gross per 24 hour  Intake              240 ml  Output              885 ml  Net             -645 ml    LBM: Last BM Date: 11/16/16 Baseline Weight: Weight: 96.7 kg (213 lb 1.6 oz) Most recent weight: Weight: 96.7 kg (213 lb 1.6 oz)     Palliative Assessment/Data:   Flowsheet Rows   Flowsheet Row Most Recent Value  Intake Tab  Referral Department  Hospitalist  Unit at Time of Referral  Cardiac/Telemetry Unit  Palliative Care Primary Diagnosis  Cancer  Date Notified  11/16/16  Palliative Care Type  New Palliative care  Reason for referral  Clarify Goals of Care  Date of Admission  11/14/16  Date first seen by Palliative Care  11/17/16  # of days Palliative referral response time  1 Day(s)  # of days IP prior to Palliative referral  2  Clinical Assessment  Palliative Performance Scale Score  40%  Psychosocial & Spiritual Assessment  Palliative Care Outcomes  Patient/Family meeting held?  Yes  Who was at  the meeting?  wife son dtr in law, patient  Palliative Care Outcomes  Counseled regarding hospice, Changed to focus on comfort, Transitioned to hospice      Time Total: 70 min. Greater than 50%  of this time was spent counseling and coordinating care related to the above assessment and plan.  Signed by: Imogene Burn, PA-C Palliative Medicine Pager: [phone]  Please contact Palliative Medicine Team phone at [phone] for questions and concerns.  For individual provider: See Shea Evans

## 2016-11-17 NOTE — Care Management Important Message (Signed)
Important Message  Patient Details  Name: Matthew Berger MRN: [id number] Date of Birth: [birth year]   Medicare Important Message Given:  Yes    [name] Abena 11/17/2016, 11:27 AM

## 2016-11-18 MED ORDER — AMOXICILLIN-POT CLAVULANATE 500-125 MG PO TABS: 1.0000 | ORAL_TABLET | Freq: Three times a day (TID) | ORAL | 0 refills | Status: AC

## 2016-11-18 MED ORDER — RIVAROXABAN 20 MG PO TABS: 20.0000 mg | ORAL_TABLET | Freq: Every day | ORAL | Status: DC

## 2016-11-18 MED ORDER — MORPHINE SULFATE (CONCENTRATE) 10 MG/0.5ML PO SOLN: 5.0000 mg | ORAL | 0 refills | Status: AC | PRN

## 2016-11-18 MED ORDER — RIVAROXABAN 15 MG PO TABS: 15.0000 mg | ORAL_TABLET | Freq: Two times a day (BID) | ORAL | Status: DC

## 2016-11-18 MED ORDER — DILTIAZEM HCL ER COATED BEADS 120 MG PO CP24: 120.0000 mg | ORAL_CAPSULE | Freq: Every day | ORAL | 0 refills | Status: AC

## 2016-11-18 MED ORDER — LORAZEPAM 1 MG PO TABS: 1.0000 mg | ORAL_TABLET | ORAL | 0 refills | Status: AC | PRN

## 2016-11-18 MED ORDER — RIVAROXABAN (XARELTO) VTE STARTER PACK (15 & 20 MG): ORAL_TABLET | ORAL | 0 refills | Status: AC

## 2016-11-18 NOTE — Progress Notes (Signed)
Met with patient in room. He is ready to go home when equipment delivered.  Spoke with son [name] by phone. States [facility] has stated equipment will be delivered between 9am-12pm.  Pt has been approved for hospice services by Orpah Melter, [place]  Son, [name] requesting Admission Visit to occur on Sunday at [place]. Will work to schedule this with our referral center.  [facility] Referral Center aware of the above.  Complete d/c summary will need to be faxed to [facility] at [phone], when final. Please notify [facility] when patient is ready to leave unit at discharge at [phone]. [facility] information and contact numbers have been given directly to the patient during visit.   Mariane Masters RNCM made aware of the above.  Thank you, Margaretmary Eddy, [facility] Liaison [phone]  All hospital liaison's are now on [place].  Please feel free to call me at the above number or call [phone].

## 2016-11-18 NOTE — Progress Notes (Signed)
ANTICOAGULATION CONSULT NOTE - FOLLOW UP  Pharmacy Consult:  Xarelto Indication: pulmonary embolus  No Known Allergies  Patient Measurements: Weight: 213 lb 1.6 oz (96.7 kg)  Heparin dosing weight = 97 kg  Vital Signs: Temp: 98 F (36.7 C) (02/17 0453) Temp Source: Oral (02/17 0453) BP: 104/42 (02/17 0453) Pulse Rate: 75 (02/17 0453)  Labs:  Recent Labs  11/15/16 1223 11/15/16 1540 11/15/16 1903 11/16/16 0209 11/17/16 0435  HGB  --   --   --  9.7*  --   HCT  --   --   --  29.5*  --   PLT  --   --   --  181  --   HEPARINUNFRC  --  0.50  --  0.58  --   CREATININE  --   --   --  0.98 1.01  TROPONINI <0.03  --  <0.03  --   --     Estimated Creatinine Clearance: 70.1 mL/min (by C-G formula based on SCr of 1.01 mg/dL).    Assessment: 24 YOM with lung cancer presented from nursing facility with hypoxia, found to have PE on CTA and started on IV heparin. Later transitioned to lovenox, now starting xarelto. -CrCl > 50 mL/min -CBC okay  Goal of Therapy:  Heparin level 0.3-0.7 units/ml Monitor platelets by anticoagulation protocol: Yes     Plan:  -Start treatment dose xarelto at 1700 today when lovenox dose would have been due. Dose is '15mg'$  BID x 21 days w/meals, then '20mg'$  daily w/ food.  -CBC as needed  Matthew Berger), PharmD  PGY1 Pharmacy Resident Pager: [phone] 11/18/2016 9:17 AM

## 2016-11-18 NOTE — Progress Notes (Signed)
Pt discharged home via family car accompanied by son. Discharge education discussed and questions answered. IV removed, pt discharged on home O2.    Matthew Berger M

## 2016-11-18 NOTE — Discharge Instructions (Addendum)
Information on my medicine - XARELTO (rivaroxaban)  This medication education was reviewed with me or my healthcare representative as part of my discharge preparation.  The pharmacist that spoke with me during my hospital stay was:  Deboraha Sprang, [place]? Xarelto was prescribed to treat blood clots that may have been found in the veins of your legs (deep vein thrombosis) or in your lungs (pulmonary embolism) and to reduce the risk of them occurring again.  What do you need to know about Xarelto? The starting dose is one 15 mg tablet taken TWICE daily with food for the FIRST 21 DAYS then on 12/10/16  the dose is changed to one 20 mg tablet taken ONCE A DAY with your evening meal.  DO NOT stop taking Xarelto without talking to the health care provider who prescribed the medication.  Refill your prescription for 20 mg tablets before you run out.  After discharge, you should have regular check-up appointments with your healthcare provider that is prescribing your Xarelto.  In the future your dose may need to be changed if your kidney function changes by a significant amount.  What do you do if you miss a dose? If you are taking Xarelto TWICE DAILY and you miss a dose, take it as soon as you remember. You may take two 15 mg tablets (total 30 mg) at the same time then resume your regularly scheduled 15 mg twice daily the next day.  If you are taking Xarelto ONCE DAILY and you miss a dose, take it as soon as you remember on the same day then continue your regularly scheduled once daily regimen the next day. Do not take two doses of Xarelto at the same time.   Important Safety Information Xarelto is a blood thinner medicine that can cause bleeding. You should call your healthcare provider right away if you experience any of the following: ? Bleeding from an injury or your nose that does not stop. ? Unusual colored urine (red or dark brown) or unusual  colored stools (red or black). ? Unusual bruising for unknown reasons. ? A serious fall or if you hit your head (even if there is no bleeding).  Some medicines may interact with Xarelto and might increase your risk of bleeding while on Xarelto. To help avoid this, consult your healthcare provider or pharmacist prior to using any new prescription or non-prescription medications, including herbals, vitamins, non-steroidal anti-inflammatory drugs (NSAIDs) and supplements.  This website has more information on Xarelto: [email or web address]

## 2016-11-18 NOTE — Discharge Summary (Signed)
[facility] Discharge Summary   Patient: Matthew Berger UXN:[id number]   PCP: [name], MD DOB: September 25, 1936   Date of admission: 11/14/2016   Date of discharge: 11/18/2016   Discharge Diagnoses:  Principal Problem:   Acute respiratory failure ([place]) Active Problems:   HTN (hypertension)   Non-small cell lung cancer (NSCLC) ([place])   Multifocal atrial tachycardia ([place])   Pneumonia   Admitted From: home Disposition:  Home with hospice  Recommendations for Outpatient Follow-up:  1. Please establish care with hospice   Follow-up Information    Hospice at [facility] Follow up.   Specialty:  Hospice and Palliative Medicine Why:  Home Hospice arranged Contact information: [place] [address] [phone]          Diet recommendation: regular diet  Activity: The patient is advised to gradually reintroduce usual activities.  Discharge Condition: good  Code Status: DNI  History of present illness: As per the H and P dictated on admission, "Matthew Berger is a 81 y.o. male with history of metastatic non-small cell lung cancer on immunotherapy was admitted last week for pneumonia with influenza and discharged to rehabilitation presents to the ER with complaints of chest pain and shortness of breath. Patient was discharged from rehabilitation yesterday following which patient became more short of breath and started developing pleuritic type of chest pain. In the ER patient was found to be febrile with chest x-ray showing persistent infiltrates concerning for pneumonia. Patient was started on antibiotics empirically for pneumonia and admitted for further workup. Patient denies any hemoptysis. Otherwise denies any nausea vomiting or diarrhea."  Hospital Course:  He presented to the ED on 11/14/16 with complaints of pleuritic chest pain and dyspnea. In the ED chest x-ray showed persistent infiltrates concerning for pneumonia but subsequent CTA chest showed  acute PE with right heart strain. He was started on IV heparin.Transitioning to subcutaneous Lovenox on 2/15. Discussed with primary oncologist who advised that patient is hospice appropriate and recommended inpatient palliative care consultation for goals of care. Palliative care consulted 2/15. Family and patient agreed with hospice on discharge, pt was d/c home on oxygen with hospice.  Summary of his active problems in the hospital is as following. 1. Acute respiratory failure (HCC)  Acute PE (involving right main, right upper lung and right lower lung pulmonary arteries) with pleuritic chest pain: Confirmed on CTA chest. Associated right heart strain by CT consistent with at least PE. Started on IV heparin infusion per pharmacy. 2-D echo EF 60-65%, RV function reduced, diastolic dysfunction  Will need to be on long-term anticoagulation given history of stage IV lung cancer. Discussed with CCM, no other intervention. On full dose Lovenox, changed to xarelto at home for pt comfort. Plan is to go home with hospice.  2.Stage IV non-small cell lung cancer, adenocarcinoma: Based on CTA chest 11/14/16, consolidation in the left lower lung with patchy nodular ground glass infiltrates throughout both lungs consistent with history of non-small cell lung cancer and sclerotic metastasis to T9.  Palliative care consultation for goals of care. Discussed with primary oncologist who agreed. Family and patient has decided to go home with hospice. Morphine and ativan provided for symptoms control at home.   3. Possible healthcare associated pneumonia:  Was on empirically with IV cefepime and vancomycin. MRSA screen negative. Blood cultures 2: Negative to date. Discontinue IV Antibiotics. Pro-calcitonin 0.15. Continue augmentin.   4. Multifocal atrial tachycardia versus A. Fib: Telemetry shows sinus rhythm.? Occasional episodes of A. fib.  Already on anticoagulation with IV heparin>Lovenox. Continue  amiodarone, diltiazemand metoprolol  5. COPD: Stable without clinical bronchospasm.  6. BPH:  Continue finasteride.  7. Chronic diastolic CHF: Clinically compensated.  8. Essential hypertension:  Soft blood pressures through out hospitalization. stop metoprolol and reduce dose of diltiazem.  9. Hyperlipidemia:  10. Hypokalemia: Resolved  11. Anemia: Stable, no active bleed.  12. Adult failure to thrive: Multifactorial secondary to advanced age, stage IV lung cancer and multiple significant comorbidities. Palliative care consulted for goals of care. Going home on hospice.  All other chronic medical condition were stable during the hospitalization.  Patient was seen by palliative care, they decided to go home with hospice. Hospice with home oxygen was arranged on discharge. On the day of the discharge the patient's vitals were stable, and no other acute medical condition were reported by patient. the patient was felt safe to be discharge at [facility] with hospice.  Procedures and Results:  none   Consultations:  Palliative care  DISCHARGE MEDICATION: Discharge Medication List as of 11/18/2016 11:34 AM    START taking these medications   Details  amoxicillin-clavulanate (AUGMENTIN) 500-125 MG tablet Take 1 tablet (500 mg total) by mouth every 8 (eight) hours., Starting Sat 11/18/2016, Until Mon 11/20/2016, Normal    LORazepam (ATIVAN) 1 MG tablet Take 1 tablet (1 mg total) by mouth every 4 (four) hours as needed for anxiety or sleep., Starting Sat 11/18/2016, Print    Morphine Sulfate (MORPHINE CONCENTRATE) 10 MG/0.5ML SOLN concentrated solution Take 0.25 mLs (5 mg total) by mouth every 4 (four) hours as needed for moderate pain (or dyspnea)., Starting Sat 11/18/2016, Print    Rivaroxaban 15 & 20 MG TBPK Take as directed on package: Start with one '15mg'$  tablet by mouth twice a day with food. On Day 22, switch to one '20mg'$  tablet once a day with food., Print        CONTINUE these medications which have CHANGED   Details  diltiazem (CARDIZEM CD) 120 MG 24 hr capsule Take 1 capsule (120 mg total) by mouth daily., Starting Sat 11/18/2016, Normal      CONTINUE these medications which have NOT CHANGED   Details  amiodarone (PACERONE) 200 MG tablet Take 200 mg by mouth daily. Please call cardiology Dr. Einar Gip for refills, Historical Med    aspirin 81 MG tablet Take 81 mg by mouth daily., Historical Med    bisacodyl (DULCOLAX) 10 MG suppository Place 10 mg rectally as needed for moderate constipation., Historical Med    Brinzolamide-Brimonidine (SIMBRINZA) 1-0.2 % SUSP Apply 2 drops to eye 2 (two) times daily., Historical Med    dorzolamide (TRUSOPT) 2 % ophthalmic solution Place 1 drop into the left eye 2 (two) times daily., Historical Med    feeding supplement (BOOST / RESOURCE BREEZE) LIQD Take 1 Container by mouth 2 (two) times daily between meals., Historical Med    finasteride (PROSCAR) 5 MG tablet Take 5 mg by mouth daily., Starting Mon 10/09/2016, Historical Med    folic acid (FOLVITE) 1 MG tablet TAKE 1 TABLET (1 MG TOTAL) BY MOUTH DAILY IN EVENINGS, Historical Med    guaiFENesin-dextromethorphan (ROBITUSSIN DM) 100-10 MG/5ML syrup Take 20 mLs by mouth every 4 (four) hours as needed for cough., Historical Med    ipratropium-albuterol (DUONEB) 0.5-2.5 (3) MG/3ML SOLN Take 3 mLs by nebulization every 6 (six) hours as needed., Starting Wed 11/08/2016, Normal    latanoprost (XALATAN) 0.005 % ophthalmic solution Place 1 drop into both eyes at bedtime.,  Historical Med    magnesium hydroxide (MILK OF MAGNESIA) 400 MG/5ML suspension Take 30 mLs by mouth daily as needed for mild constipation., Historical Med    metoprolol (LOPRESSOR) 50 MG tablet Take 1 tablet (50 mg total) by mouth 2 (two) times daily., Starting Thu 07/06/2016, Normal    omeprazole (PRILOSEC) 40 MG capsule Take 40 mg by mouth daily., Historical Med    ondansetron (ZOFRAN) 4 MG tablet  Take 4 mg by mouth every 8 (eight) hours as needed for nausea or vomiting., Historical Med    prochlorperazine (COMPAZINE) 10 MG tablet TAKE 1 TABLET BY MOUTH EVERY 6 HOURS AS NEEDED FOR NAUSEA AND VOMITING, Normal    Sodium Phosphates (RA SALINE ENEMA) 19-7 GM/118ML ENEM Place 1 application rectally once., Historical Med    tamsulosin (FLOMAX) 0.4 MG CAPS capsule Take 0.4 mg by mouth daily after supper. , Historical Med    vitamin B-12 (CYANOCOBALAMIN) 1000 MCG tablet Take 1,000 mcg by mouth daily., Historical Med      STOP taking these medications     oseltamivir (TAMIFLU) 75 MG capsule        No Known Allergies Discharge Instructions    Diet - low sodium heart healthy    Complete by:  As directed    Discharge instructions    Complete by:  As directed    It is important that you read following instructions as well as go over your medication list with RN to help you understand your care after this hospitalization.  Discharge Instructions: Please follow-up with PCP in one week  Please request your primary care physician to go over all Hospital Tests and Procedure/Radiological results at the follow up,  Please get all Hospital records sent to your PCP by signing hospital release before you go home.   Do not drive, operating heavy machinery, perform activities at heights, swimming or participation in water activities or provide baby sitting services while your are on Pain, Sleep and Anxiety Medications; until you have been seen by Primary Care Physician or a Neurologist and advised to do so again. Do not take more than prescribed Pain, Sleep and Anxiety Medications. You were cared for by a hospitalist during your hospital stay. If you have any questions about your discharge medications or the care you received while you were in the hospital after you are discharged, you can call the unit and ask to speak with the hospitalist on call if the hospitalist that took care of you is not  available.  Once you are discharged, your primary care physician will handle any further medical issues. Please note that NO REFILLS for any discharge medications will be authorized once you are discharged, as it is imperative that you return to your primary care physician (or establish a relationship with a primary care physician if you do not have one) for your aftercare needs so that they can reassess your need for medications and monitor your lab values. You Must read complete instructions/literature along with all the possible adverse reactions/side effects for all the Medicines you take and that have been prescribed to you. Take any new Medicines after you have completely understood and accept all the possible adverse reactions/side effects. Wear Seat belts while driving. If you have smoked or chewed Tobacco in the last 2 yrs please stop smoking and/or stop any Recreational drug use.   Increase activity slowly    Complete by:  As directed      Discharge Exam: Filed Weights   11/14/16  2130  Weight: 96.7 kg (213 lb 1.6 oz)   Vitals:   11/17/16 2018 11/18/16 0453  BP: (!) 116/51 (!) 104/42  Pulse: 69 75  Resp: 18 18  Temp: 98.3 F (36.8 C) 98 F (36.7 C)   General: Appear in mild distress, no Rash; Oral Mucosa moist. Cardiovascular: S1 and S2 Present, no Murmur, no JVD Respiratory: Bilateral Air entry present and basal Crackles, no wheezes Abdomen: Bowel Sound present, Soft and no tenderness Extremities: no Pedal edema, no calf tenderness Neurology: Grossly no focal neuro deficit.  The results of significant diagnostics from this hospitalization (including imaging, microbiology, ancillary and laboratory) are listed below for reference.    Significant Diagnostic Studies: Dg Chest 2 View  Result Date: 11/01/2016 CLINICAL DATA:  History of lung carcinoma with fever and flu like symptoms EXAM: CHEST  2 VIEW COMPARISON:  09/15/2016 FINDINGS: Cardiac shadow is within normal. Diffuse  density noted throughout the left lung base similar to that seen on recent CT examination representing a component of neoplasm and consolidation. No sizable effusion is noted. The nodular changes seen in the right lung are less well appreciated on the current exam. IMPRESSION: Improved aeration in the left base although persistent density is noted similar to that seen on prior CT examination. The previously seen right-sided nodules are less well appreciated on the current study. Electronically Signed   By: Inez Catalina M.D.   On: 11/01/2016 11:20   Ct Head Wo Contrast  Result Date: 11/04/2016 CLINICAL DATA:  Altered mental status.  History of lung cancer. EXAM: CT HEAD WITHOUT CONTRAST TECHNIQUE: Contiguous axial images were obtained from the base of the skull through the vertex without intravenous contrast. COMPARISON:  Brain MRI dated 12/17/2015. FINDINGS: Brain: There is mild generalized age related parenchymal atrophy with commensurate dilatation of the ventricles and sulci. Mild chronic small vessel ischemic change noted within the deep periventricular white matter regions bilaterally. There is no mass, hemorrhage, edema or other evidence of acute parenchymal abnormality. No extra-axial hemorrhage. Vascular: There are chronic calcified atherosclerotic changes of the large vessels at the skull base. No unexpected hyperdense vessel. Skull: Normal. Negative for fracture or focal lesion. Sinuses/Orbits: No acute finding. Other: None. IMPRESSION: 1. No acute findings.  No intracranial mass, hemorrhage or edema. 2. Mild atrophy and chronic ischemic changes in the white matter. Electronically Signed   By: Franki Cabot M.D.   On: 11/04/2016 18:16   Ct Angio Chest Pe W Or Wo Contrast  Addendum Date: 11/14/2016   ADDENDUM REPORT: 11/14/2016 23:08 ADDENDUM: These results were called by telephone at the time of interpretation on 11/14/2016 at 11:08 pm to Dr. Baltazar Najjar , who verbally acknowledged these results.  Electronically Signed   By: Lucienne Capers M.D.   On: 11/14/2016 23:08   Result Date: 11/14/2016 CLINICAL DATA:  Chest pain EXAM: CT ANGIOGRAPHY CHEST WITH CONTRAST TECHNIQUE: Multidetector CT imaging of the chest was performed using the standard protocol during bolus administration of intravenous contrast. Multiplanar CT image reconstructions and MIPs were obtained to evaluate the vascular anatomy. CONTRAST:  100 mL Isovue 370 COMPARISON:  11/04/2016 FINDINGS: Cardiovascular: Good opacification of the central and segmental pulmonary arteries. Since the previous study, there are new filling defects in the distal right main pulmonary artery extending into upper, middle, and lower lobe branches. Appearance is consistent with acute pulmonary embolus. RV to LV ratio is 1.1 which may indicate right heart strain. No pericardial effusion. Normal caliber thoracic aorta without dissection. Calcification in the  aorta and coronary arteries. Mediastinum/Nodes: Esophagus is decompressed. No significant lymphadenopathy in the chest. Lungs/Pleura: Persisting consolidation and volume loss in the left lower lung and left lingula with patchy nodular infiltration in the left upper lung, right upper lung, right middle lung, and right lower lung. This is consistent with history of non-small cell lung cancer although superimposed infectious process could also be present. No pleural effusions. No pneumothorax. Airways are patent. Upper Abdomen: No acute abnormality. Musculoskeletal: Sclerotic and lucent lesions in the T9 vertebra are likely metastatic. No change since prior study. Review of the MIP images confirms the above findings. IMPRESSION: New finding of acute pulmonary embolus involving right main, right upper lung, and right lower lung pulmonary arteries. There is CT evidence of right heart strain (RV/LV Ratio = 1.1) consistent with at least submassive (intermediate risk) PE. The presence of right heart strain has been  associated with an increased risk of morbidity and mortality. Please activate Code PE by paging [phone]. Consolidation in the left lower lung with patchy nodular ground-glass infiltrates throughout both lungs consistent with history of non-small cell lung cancer. Sclerotic lesion at T9, likely metastatic. These results were called by telephone at the time of interpretation on 11/14/2016 at 10:53 pm to [facility], the patient's nurse on [address], who verbally acknowledged these results. Attempting to reach the attending physician. Electronically Signed: By: Lucienne Capers M.D. On: 11/14/2016 22:55   Ct Angio Chest Pe W Or Wo Contrast  Result Date: 11/04/2016 CLINICAL DATA:  Positive for flu. History of lung cancer. Shortness of breath with low oxygen. EXAM: CT ANGIOGRAPHY CHEST WITH CONTRAST TECHNIQUE: Multidetector CT imaging of the chest was performed using the standard protocol during bolus administration of intravenous contrast. Multiplanar CT image reconstructions and MIPs were obtained to evaluate the vascular anatomy. CONTRAST:  100 cc Isovue 370 COMPARISON:  Chest x-ray 11/04/2016, chest CT 06/29/2016 FINDINGS: Cardiovascular: Coronary artery calcifications are present. No pericardial effusion. There is atherosclerotic calcification of the thoracic aorta not associated with aneurysm. Pulmonary arteries are only moderately well opacified by contrast bolus. No evidence for acute pulmonary emboli in the main pulmonary arteries and larger branches. Smaller pulmonary emboli would not be detectable due to artifacts and poor bolus. Mediastinum/Nodes: The visualized portion of the thyroid gland has a normal appearance. Small mediastinal lymph nodes are present, stable in appearance. Soft tissue in the precarinal/subcarinal regions appear stable with discrete nodes difficult to measure. Largest is estimated to measure approximately 11 mm greatest diameter. The esophagus is normal in appearance. Small hiatal hernia.  Lungs/Pleura: There are bilateral pleural effusions, left greater than right. Consolidation in the left lower lobe persists with air bronchograms. This process is contiguous with numerous nodular opacities throughout the left lower lobe and extending into the lingula /left upper lobe. Within the right lung there are more numerous ground-glass opacities, now too numerous to count. Index lesion in the right upper lobe previously measured 1.6 cm and currently measures 2.5 cm on image 51 of series 10. Upper Abdomen: The gallbladder is present.  No acute abnormality. Musculoskeletal: Sclerotic lesion within T9 appears stable. No new lesions or evidence for acute fracture. Review of the MIP images confirms the above findings. IMPRESSION: 1. Significant progression of pulmonary disease. Numerous ground-glass nodules are now identified throughout upper and lower lobes bilaterally with significant components involving the left lower lobe, lingula/left upper lobe. 2. Bilateral pleural effusions left greater than right. 3. Quality of the exam is limited to exclusion of pulmonary emboli within  the main pulmonary arteries and segmental branches. Smaller pulmonary emboli would be very difficult to exclude. 4. Coronary artery disease. 5. Stable appearance of mediastinal lymph nodes. 6. Stable appearance of sclerotic lesion in the T9 vertebral body. Electronically Signed   By: Nolon Nations M.D.   On: 11/04/2016 18:27   Dg Chest Port 1 View  Result Date: 11/14/2016 CLINICAL DATA:  Acute on chronic dyspnea EXAM: PORTABLE CHEST 1 VIEW COMPARISON:  11/04/2016 CT, CXR 11/01/2016 FINDINGS: Heart is top-normal in size. There is aortic atherosclerosis without aneurysm. Pneumonic consolidations involving the left mid and lower lung and to a lesser degree right upper and lower lobes are identified consistent multifocal pneumonia, slightly worsened at the left lung base since previous exam. High-riding left humeral head may reflect  chronic rotator cuff tear with narrowing the impingement space. No acute nor suspicious osseous abnormalities. IMPRESSION: Multifocal airspace opacities suspicious for pneumonia and/or atelectasis, slightly worsened at the left lung base since prior radiograph. Electronically Signed   By: Ashley Royalty M.D.   On: 11/14/2016 18:27   Dg Chest Port 1 View  Result Date: 11/04/2016 CLINICAL DATA:  Sepsis.  History of lung cancer. EXAM: PORTABLE CHEST 1 VIEW COMPARISON:  CT scan September 15, 2016 and chest x-ray November 01, 2016 FINDINGS: The patient's known pulmonary nodules are not as well appreciated on this study compared to the recent CT scan. Vague densities in the central right lung likely represent residual nodules. The opacity in the left mid to lower lung is similar in the interval. Left retrocardiac opacity is stable. No pneumothorax or other change. IMPRESSION: Mild improvement on the right and no significant interval change on the left. Electronically Signed   By: Dorise Bullion III M.D   On: 11/04/2016 15:37    Microbiology: Recent Results (from the past 240 hour(s))  Blood culture (routine x 2)     Status: None (Preliminary result)   Collection Time: 11/14/16  5:55 PM  Result Value Ref Range Status   Specimen Description BLOOD RIGHT ANTECUBITAL  Final   Special Requests IN PEDIATRIC BOTTLE 4CC  Final   Culture NO GROWTH 4 DAYS  Final   Report Status PENDING  Incomplete  Blood culture (routine x 2)     Status: None (Preliminary result)   Collection Time: 11/14/16  6:01 PM  Result Value Ref Range Status   Specimen Description BLOOD LEFT FOREARM  Final   Special Requests BOTTLES DRAWN AEROBIC ONLY 10CC  Final   Culture NO GROWTH 4 DAYS  Final   Report Status PENDING  Incomplete  MRSA PCR Screening     Status: None   Collection Time: 11/15/16 12:12 AM  Result Value Ref Range Status   MRSA by PCR NEGATIVE NEGATIVE Final    Comment:        The GeneXpert MRSA Assay (FDA approved for  NASAL specimens only), is one component of a comprehensive MRSA colonization surveillance program. It is not intended to diagnose MRSA infection nor to guide or monitor treatment for MRSA infections.      Labs: CBC:  Recent Labs Lab 11/13/16 11/14/16 1801 11/15/16 0448 11/16/16 0209  WBC 8.5 11.3* 11.2* 9.4  HGB 11.7* 11.1* 10.6* 9.7*  HCT 37* 32.9* 32.1* 29.5*  MCV  --  87.3 87.7 86.8  PLT 195 190 174 782   Basic Metabolic Panel:  Recent Labs Lab 11/14/16 1801 11/15/16 0448 11/15/16 0751 11/16/16 0209 11/17/16 0435  NA 139 139  --  139 138  K 3.2* 3.0*  --  2.7* 3.3*  CL 106 107  --  110 109  CO2 23 21*  --  20* 21*  GLUCOSE 138* 130*  --  97 99  BUN 13 10  --  9 9  CREATININE 1.21 1.04  --  0.98 1.01  CALCIUM 8.7* 8.2*  --  8.2* 8.5*  MG  --   --  1.7 1.7 1.9   Liver Function Tests:  Recent Labs Lab 11/14/16 1801  AST 20  ALT 19  ALKPHOS 49  BILITOT 0.9  PROT 6.1*  ALBUMIN 2.6*   Cardiac Enzymes:  Recent Labs Lab 11/15/16 0751 11/15/16 1223 11/15/16 1903  TROPONINI <0.03 <0.03 <0.03   BNP (last 3 results)  Recent Labs  06/28/16 0946  BNP 123.7*   CBG: No results for input(s): GLUCAP in the last 168 hours. Time spent: 30 minutes  Signed:  Emberli Ballester  [facility] 11/18/2016 , 2:02 PM

## 2016-11-19 ENCOUNTER — Encounter (HOSPITAL_BASED_OUTPATIENT_CLINIC_OR_DEPARTMENT_OTHER): Payer: Medicare Other

## 2016-11-19 LAB — CULTURE, BLOOD (ROUTINE X 2)
Culture: NO GROWTH
Culture: NO GROWTH

## 2016-11-21 NOTE — Telephone Encounter (Signed)
Spoke with pt and made him aware of his ONO results per SN. Pt agreed to the repeat ONO. The order was placed. Pt had no further questions at this time. Nothing further is needed.

## 2016-11-21 NOTE — Addendum Note (Signed)
Addended by: Benson Setting L on: 11/21/2016 05:00 PM   Modules accepted: Orders

## 2016-11-29 ENCOUNTER — Other Ambulatory Visit: Payer: Medicare Other

## 2016-11-29 ENCOUNTER — Ambulatory Visit: Payer: Medicare Other | Admitting: Internal Medicine

## 2016-11-29 ENCOUNTER — Ambulatory Visit: Payer: Medicare Other

## 2016-12-02 ENCOUNTER — Encounter: Payer: Self-pay | Admitting: Internal Medicine

## 2016-12-03 ENCOUNTER — Encounter: Payer: Self-pay | Admitting: Internal Medicine

## 2016-12-04 ENCOUNTER — Ambulatory Visit: Payer: Medicare Other | Admitting: Pulmonary Disease

## 2016-12-07 DIAGNOSIS — Z515 Encounter for palliative care: Secondary | ICD-10-CM

## 2016-12-07 DIAGNOSIS — Z7189 Other specified counseling: Secondary | ICD-10-CM

## 2016-12-12 ENCOUNTER — Telehealth: Payer: Self-pay

## 2016-12-12 MED ORDER — FOLIC ACID 1 MG PO TABS: 1.0000 mg | ORAL_TABLET | Freq: Every day | ORAL | 0 refills | Status: DC

## 2016-12-12 NOTE — Telephone Encounter (Signed)
Faxed refill request folic acid

## 2016-12-13 ENCOUNTER — Ambulatory Visit: Payer: Medicare Other

## 2016-12-13 ENCOUNTER — Ambulatory Visit: Payer: Medicare Other | Admitting: Internal Medicine

## 2016-12-13 ENCOUNTER — Other Ambulatory Visit: Payer: Medicare Other

## 2017-04-16 ENCOUNTER — Other Ambulatory Visit: Payer: Self-pay | Admitting: Medical Oncology

## 2017-05-02 DEATH — deceased

## 2017-12-28 ENCOUNTER — Other Ambulatory Visit: Payer: Self-pay | Admitting: Nurse Practitioner
